# Patient Record
Sex: Female | Born: 1960 | Race: White | Hispanic: No | Marital: Married | Smoking: Current every day smoker
Health system: Southern US, Community
[De-identification: ages and names within clinical notes are randomized; demographics above are authoritative.]

## PROBLEM LIST (undated history)

## (undated) DIAGNOSIS — G4730 Sleep apnea, unspecified: Secondary | ICD-10-CM

## (undated) DIAGNOSIS — J189 Pneumonia, unspecified organism: Secondary | ICD-10-CM

## (undated) DIAGNOSIS — K219 Gastro-esophageal reflux disease without esophagitis: Secondary | ICD-10-CM

## (undated) DIAGNOSIS — J449 Chronic obstructive pulmonary disease, unspecified: Secondary | ICD-10-CM

## (undated) DIAGNOSIS — E039 Hypothyroidism, unspecified: Secondary | ICD-10-CM

## (undated) DIAGNOSIS — G629 Polyneuropathy, unspecified: Secondary | ICD-10-CM

## (undated) DIAGNOSIS — G2581 Restless legs syndrome: Secondary | ICD-10-CM

## (undated) DIAGNOSIS — M1990 Unspecified osteoarthritis, unspecified site: Secondary | ICD-10-CM

## (undated) DIAGNOSIS — M545 Low back pain, unspecified: Secondary | ICD-10-CM

## (undated) DIAGNOSIS — M542 Cervicalgia: Secondary | ICD-10-CM

## (undated) DIAGNOSIS — R0602 Shortness of breath: Secondary | ICD-10-CM

## (undated) DIAGNOSIS — R51 Headache: Secondary | ICD-10-CM

## (undated) DIAGNOSIS — G43909 Migraine, unspecified, not intractable, without status migrainosus: Secondary | ICD-10-CM

## (undated) DIAGNOSIS — E119 Type 2 diabetes mellitus without complications: Secondary | ICD-10-CM

## (undated) DIAGNOSIS — IMO0002 Reserved for concepts with insufficient information to code with codable children: Secondary | ICD-10-CM

## (undated) DIAGNOSIS — G8929 Other chronic pain: Secondary | ICD-10-CM

## (undated) DIAGNOSIS — D649 Anemia, unspecified: Secondary | ICD-10-CM

## (undated) DIAGNOSIS — F419 Anxiety disorder, unspecified: Secondary | ICD-10-CM

## (undated) DIAGNOSIS — M549 Dorsalgia, unspecified: Secondary | ICD-10-CM

## (undated) DIAGNOSIS — G5600 Carpal tunnel syndrome, unspecified upper limb: Secondary | ICD-10-CM

## (undated) DIAGNOSIS — I2510 Atherosclerotic heart disease of native coronary artery without angina pectoris: Secondary | ICD-10-CM

## (undated) DIAGNOSIS — IMO0001 Reserved for inherently not codable concepts without codable children: Secondary | ICD-10-CM

## (undated) HISTORY — DX: Hypothyroidism, unspecified: E03.9

## (undated) HISTORY — PX: CARDIAC CATHETERIZATION: SHX172

## (undated) HISTORY — PX: BACK SURGERY: SHX140

## (undated) HISTORY — DX: Anxiety disorder, unspecified: F41.9

## (undated) HISTORY — DX: Chronic obstructive pulmonary disease, unspecified: J44.9

## (undated) HISTORY — DX: Reserved for inherently not codable concepts without codable children: IMO0001

## (undated) HISTORY — DX: Low back pain: M54.5

## (undated) HISTORY — DX: Migraine, unspecified, not intractable, without status migrainosus: G43.909

## (undated) HISTORY — DX: Type 2 diabetes mellitus without complications: E11.9

## (undated) HISTORY — DX: Carpal tunnel syndrome, unspecified upper limb: G56.00

## (undated) HISTORY — DX: Cervicalgia: M54.2

## (undated) HISTORY — PX: CHOLECYSTECTOMY: SHX55

## (undated) HISTORY — DX: Low back pain, unspecified: M54.50

## (undated) HISTORY — PX: BLADDER SUSPENSION: SHX72

## (undated) HISTORY — PX: TUBAL LIGATION: SHX77

---

## 1997-12-12 ENCOUNTER — Other Ambulatory Visit: Admission: RE | Admit: 1997-12-12 | Discharge: 1997-12-12 | Payer: Self-pay | Admitting: Gynecology

## 2001-01-09 ENCOUNTER — Emergency Department (HOSPITAL_COMMUNITY): Admission: EM | Admit: 2001-01-09 | Discharge: 2001-01-09 | Payer: Self-pay | Admitting: Emergency Medicine

## 2001-01-09 ENCOUNTER — Encounter: Payer: Self-pay | Admitting: Emergency Medicine

## 2001-01-09 ENCOUNTER — Ambulatory Visit (HOSPITAL_COMMUNITY): Admission: RE | Admit: 2001-01-09 | Discharge: 2001-01-09 | Payer: Self-pay | Admitting: Obstetrics and Gynecology

## 2001-01-17 ENCOUNTER — Ambulatory Visit (HOSPITAL_COMMUNITY): Admission: RE | Admit: 2001-01-17 | Discharge: 2001-01-17 | Payer: Self-pay | Admitting: Obstetrics and Gynecology

## 2001-01-23 ENCOUNTER — Encounter: Payer: Self-pay | Admitting: Emergency Medicine

## 2001-01-23 ENCOUNTER — Emergency Department (HOSPITAL_COMMUNITY): Admission: EM | Admit: 2001-01-23 | Discharge: 2001-01-23 | Payer: Self-pay | Admitting: Emergency Medicine

## 2002-01-11 ENCOUNTER — Ambulatory Visit (HOSPITAL_COMMUNITY): Admission: RE | Admit: 2002-01-11 | Discharge: 2002-01-11 | Payer: Self-pay | Admitting: Obstetrics and Gynecology

## 2002-10-19 ENCOUNTER — Encounter: Payer: Self-pay | Admitting: Emergency Medicine

## 2002-10-19 ENCOUNTER — Emergency Department (HOSPITAL_COMMUNITY): Admission: EM | Admit: 2002-10-19 | Discharge: 2002-10-19 | Payer: Self-pay | Admitting: Emergency Medicine

## 2003-01-31 ENCOUNTER — Ambulatory Visit (HOSPITAL_COMMUNITY): Admission: RE | Admit: 2003-01-31 | Discharge: 2003-01-31 | Payer: Self-pay | Admitting: Obstetrics and Gynecology

## 2003-02-01 ENCOUNTER — Emergency Department (HOSPITAL_COMMUNITY): Admission: EM | Admit: 2003-02-01 | Discharge: 2003-02-01 | Payer: Self-pay | Admitting: Emergency Medicine

## 2003-02-19 ENCOUNTER — Emergency Department (HOSPITAL_COMMUNITY): Admission: EM | Admit: 2003-02-19 | Discharge: 2003-02-19 | Payer: Self-pay | Admitting: Emergency Medicine

## 2003-12-02 ENCOUNTER — Emergency Department (HOSPITAL_COMMUNITY): Admission: EM | Admit: 2003-12-02 | Discharge: 2003-12-02 | Payer: Self-pay | Admitting: Emergency Medicine

## 2004-03-13 ENCOUNTER — Ambulatory Visit (HOSPITAL_COMMUNITY): Admission: RE | Admit: 2004-03-13 | Discharge: 2004-03-13 | Payer: Self-pay | Admitting: Obstetrics and Gynecology

## 2004-05-28 ENCOUNTER — Ambulatory Visit (HOSPITAL_COMMUNITY): Admission: RE | Admit: 2004-05-28 | Discharge: 2004-05-28 | Payer: Self-pay | Admitting: Obstetrics and Gynecology

## 2005-04-05 ENCOUNTER — Ambulatory Visit (HOSPITAL_COMMUNITY): Admission: RE | Admit: 2005-04-05 | Discharge: 2005-04-05 | Payer: Self-pay | Admitting: Obstetrics and Gynecology

## 2006-05-08 ENCOUNTER — Emergency Department (HOSPITAL_COMMUNITY): Admission: EM | Admit: 2006-05-08 | Discharge: 2006-05-08 | Payer: Self-pay | Admitting: Emergency Medicine

## 2006-06-09 ENCOUNTER — Ambulatory Visit (HOSPITAL_COMMUNITY): Admission: RE | Admit: 2006-06-09 | Discharge: 2006-06-09 | Payer: Self-pay | Admitting: Obstetrics and Gynecology

## 2006-07-03 ENCOUNTER — Emergency Department (HOSPITAL_COMMUNITY): Admission: EM | Admit: 2006-07-03 | Discharge: 2006-07-03 | Payer: Self-pay | Admitting: Emergency Medicine

## 2006-09-24 ENCOUNTER — Emergency Department (HOSPITAL_COMMUNITY): Admission: EM | Admit: 2006-09-24 | Discharge: 2006-09-24 | Payer: Self-pay | Admitting: Emergency Medicine

## 2006-10-21 ENCOUNTER — Emergency Department (HOSPITAL_COMMUNITY): Admission: EM | Admit: 2006-10-21 | Discharge: 2006-10-21 | Payer: Self-pay | Admitting: Emergency Medicine

## 2006-10-26 ENCOUNTER — Emergency Department (HOSPITAL_COMMUNITY): Admission: EM | Admit: 2006-10-26 | Discharge: 2006-10-26 | Payer: Self-pay | Admitting: Emergency Medicine

## 2006-11-01 ENCOUNTER — Emergency Department (HOSPITAL_COMMUNITY): Admission: EM | Admit: 2006-11-01 | Discharge: 2006-11-01 | Payer: Self-pay | Admitting: Emergency Medicine

## 2007-02-13 ENCOUNTER — Ambulatory Visit (HOSPITAL_COMMUNITY): Admission: RE | Admit: 2007-02-13 | Discharge: 2007-02-13 | Payer: Self-pay | Admitting: Neurology

## 2007-03-02 ENCOUNTER — Encounter (HOSPITAL_COMMUNITY): Admission: RE | Admit: 2007-03-02 | Discharge: 2007-04-01 | Payer: Self-pay | Admitting: Anesthesiology

## 2007-04-16 ENCOUNTER — Other Ambulatory Visit: Payer: Self-pay | Admitting: Emergency Medicine

## 2007-04-16 ENCOUNTER — Other Ambulatory Visit: Payer: Self-pay

## 2007-04-16 ENCOUNTER — Inpatient Hospital Stay (HOSPITAL_COMMUNITY): Admission: EM | Admit: 2007-04-16 | Discharge: 2007-04-17 | Payer: Self-pay | Admitting: *Deleted

## 2007-05-23 ENCOUNTER — Encounter (HOSPITAL_COMMUNITY): Admission: RE | Admit: 2007-05-23 | Discharge: 2007-06-22 | Payer: Self-pay | Admitting: Neurology

## 2007-06-13 ENCOUNTER — Ambulatory Visit (HOSPITAL_COMMUNITY): Admission: RE | Admit: 2007-06-13 | Discharge: 2007-06-13 | Payer: Self-pay | Admitting: Obstetrics and Gynecology

## 2007-06-23 ENCOUNTER — Encounter (HOSPITAL_COMMUNITY): Admission: RE | Admit: 2007-06-23 | Discharge: 2007-07-23 | Payer: Self-pay | Admitting: Neurology

## 2007-08-25 ENCOUNTER — Ambulatory Visit (HOSPITAL_COMMUNITY): Admission: RE | Admit: 2007-08-25 | Discharge: 2007-08-25 | Payer: Self-pay | Admitting: Anesthesiology

## 2007-09-19 ENCOUNTER — Emergency Department (HOSPITAL_COMMUNITY): Admission: EM | Admit: 2007-09-19 | Discharge: 2007-09-19 | Payer: Self-pay | Admitting: Emergency Medicine

## 2007-12-23 ENCOUNTER — Ambulatory Visit (HOSPITAL_COMMUNITY): Admission: RE | Admit: 2007-12-23 | Discharge: 2007-12-23 | Payer: Self-pay | Admitting: Anesthesiology

## 2008-06-01 ENCOUNTER — Emergency Department (HOSPITAL_COMMUNITY): Admission: EM | Admit: 2008-06-01 | Discharge: 2008-06-01 | Payer: Self-pay | Admitting: Emergency Medicine

## 2010-01-09 ENCOUNTER — Emergency Department (HOSPITAL_COMMUNITY)
Admission: EM | Admit: 2010-01-09 | Discharge: 2010-01-09 | Payer: Self-pay | Source: Home / Self Care | Admitting: Emergency Medicine

## 2010-01-14 ENCOUNTER — Other Ambulatory Visit: Payer: Self-pay | Admitting: Obstetrics and Gynecology

## 2010-01-14 ENCOUNTER — Other Ambulatory Visit
Admission: RE | Admit: 2010-01-14 | Discharge: 2010-01-14 | Payer: Self-pay | Source: Home / Self Care | Admitting: Obstetrics and Gynecology

## 2010-01-25 ENCOUNTER — Encounter: Payer: Self-pay | Admitting: Obstetrics and Gynecology

## 2010-01-26 ENCOUNTER — Encounter: Payer: Self-pay | Admitting: Obstetrics and Gynecology

## 2010-04-25 ENCOUNTER — Emergency Department (HOSPITAL_COMMUNITY)
Admission: EM | Admit: 2010-04-25 | Discharge: 2010-04-25 | Disposition: A | Discharge status: Home / Self Care | Payer: Medicaid Other | Attending: Emergency Medicine | Admitting: Emergency Medicine

## 2010-04-25 DIAGNOSIS — H109 Unspecified conjunctivitis: Secondary | ICD-10-CM | POA: Insufficient documentation

## 2010-05-19 NOTE — Discharge Summary (Signed)
Patricia Bass, CHICO NO.:  [id number]   MEDICAL RECORD NO.:  [id number]          PATIENT TYPE:  INP   LOCATION:  2028                         FACILITY:  [facility]   PHYSICIAN:  Ritta Slot, MD     DATE OF BIRTH:  1960/07/05   DATE OF ADMISSION:  04/16/2007  DATE OF DISCHARGE:  04/17/2007                               DISCHARGE SUMMARY   HISTORY OF PRESENT ILLNESS:  Ms. Patricia Bass is a 50 year old female patient  who came into the emergency room because of chest pain radiating across  her chest to the left shoulder and arms.  She had shortness of breath  with this, but no diaphoresis.  It lasted for about 2 hours, it was  relieved with sublingual nitroglycerin in the emergency room.  Thus, it  was decided that she should undergo cardiac catheterization.  She was  put on IV heparin.  She had a cardiac catheterization on April 17, 2007.  She had only 30% in her LAD and 50% in her mid RCA, which was  nondominant and was decided she could go home later that afternoon.  EF  was 65%.  She did have smoking cessation consult.   LABORATORY DATA:  Chest x-ray show pulmonary vascular congestion without  overt pulmonary edema, patchy airspace opacity in the left lower lobe,  they were atelectasis.  Hemoglobin was 12.7, hematocrit 37.3, WBC is  9.4, and platelets 166.  Sodium 141, potassium 3.7, albumin 7,  creatinine 0.65, amylase 128, lipase 24, CK-MB and troponin negative x2.  Total cholesterol 178, triglycerides 412, HDL 26, and LDL was not  calculated.  TSH was 4.299.   DISCHARGE MEDICATIONS:  1. Nabumetone 500 mg b.i.d.  2. Gabapentin 300 mg t.i.d.  3. Fexofenadine 180 mg every day  4. Morphine sulfate 30 mg p.o. a day.  5. Omeprazole 20 mg every day.  6. Cymbalta 30 mg at bedtime.  7. Valtrex 500 mg every day.  8. Aspirin 81 mg a day.   She was given instructions on diet, asked to quit smoking.  She was told  to start exercise program to try to lose weight and follow  up with her  primary doctor for her cholesterols.  We have not put her on a  cholesterol medication at this time.  We will try diet initially.  She  should see Dr. Milinda Cave this week.   DISCHARGE DIAGNOSES:  1. Chest pain, not coronary ischemia, possible musculoskeletal versus      gastroesophageal reflux disease.  2. Chronic pain, history on multiple pain medications.  3. Coronary artery disease, nonobstructive and mild.  4. Borderline diabetes mellitus.  5. Metabolic syndrome.  6. Tobacco use.  7. Premature family history of heart disease.  8. Gastroesophageal reflux disease/peptic ulcer disease.  9. Obstructive sleep apnea.  10.Obesity.  11.Fibromyalgia.  12.Chronic back pain.  13.Restless legs syndrome.  14.History of cholecystectomy.  15.Tubal ligation.      Lezlie Octave, N.P.      Ritta Slot, MD  Electronically Signed    BB/MEDQ  D:  05/12/2007  T:  05/13/2007  Job:  [id number]   cc:   Jeoffrey Massed, MD

## 2010-05-19 NOTE — Cardiovascular Report (Signed)
NAMEBISMA, Patricia NO.:  [id number]   MEDICAL RECORD NO.:  [id number]          PATIENT TYPE:  INP   LOCATION:  2028                         FACILITY:  [facility]   PHYSICIAN:  Ritta Slot, MD     DATE OF BIRTH:  [birth year]   DATE OF PROCEDURE:  DATE OF DISCHARGE:  04/17/2007                            CARDIAC CATHETERIZATION   PROCEDURE PERFORMED:  1. Retrograde left heart cardiac catheterization.  2. Selective coronary artery visualization of the coronary artery.  3. Left ventriculography.   INDICATIONS:  This is a 50 year old Caucasian female who was admitted  yesterday with chest pain, but with negative troponins and negative ECG,  had recent atherosclerotic coronary artery disease, acute smoking,  diabetes, and obesity.  She is therefore brought to the [facility]  Catheterization [facility] for evaluation of the coronary arteries.   PROCEDURES:  1. Approach right femoral artery.  2. Local anesthetic used 15 millimeters of 1% lidocaine.  3. Sedation given is 2 mg of Versed intravenous.  4. Complications none.   INDICATIONS FOR PROCEDURE:  After informed consent, the patient's right  coronary was prepped and draped in the usual sterile fashion.  The local  anesthetic was infiltrated in the right groin for local anesthesia.  The  right femoral artery was accessed using the Seldinger technique and a  Cook needle.  Through the [facility] needle, we swept the J-wire.  The J-wire  was placed in the descending aorta.  The Cook needle was removed and a 6-  [place] sheath was placed over the J-wire.  Over the J-wire, I then  passed the JL4 catheter.  This engaged the left main coronary artery  without difficulty.  Selective visualization of the left coronary artery  system was obtained in AP cranial, AP caudal, and cranial view.  The JL4  catheter was then exchanged over the J-wire for JL4 catheter.  The JL4  catheter was then engaged into the right coronary artery  without  difficulty.  Visualization of the right coronary artery was obtained in  the LA cranial and RA cranial view.  The JL4 catheter was then exchanged  over the J-wire for an angled pigtail.  The angled pigtail was placed  into the LV without difficulty.  Visualization of the left ventricle was  obtained in the RA view using 30 milliliters of contrast with 12  milliliters per second.   FINDINGS:  1. Right coronary artery.  The right coronary artery was found to be      long and dominant, giving rise to the PLA and PDA branch as well as      an RV marginal branch.  Just after the bifurcation of the left      ventricle marginal, there is a discrete 50% stenosis in the mid RCA      that did not need intervention.  The left anterior descending      artery was a long artery.  It went all the way around to the apex.      It gave rise to one diagonal and multiple septals.  There was no  evidence of any significant stenosis in the LAD system, although      there was a mild 30% discrete lesion in the mid LAD after the      second septal perforation.  2. The second circumflex system was a large vessel giving rise to      three obtuse marginals.  There was no evidence of any significant      disease in the circumflex system.  3. The left ventriculography showed normal left ventricular function      with an EF of 65%.  The Ao pressure was 112/70/85.  Left      ventricular pressure was 117/11/19.  Left ventricular pressure was      123/19/20.  Ao pressure was 111/57/87.  There was no evidence of      aortic valve stenosis by pullback gradient.   IMPRESSION:  Mild coronary artery disease with a mid right coronary  artery lesion of 50% and mid left anterior descending lesion of 30%.  Normal circumflexes.  Normal left ventricular function.   PLAN:  The patient to go home later today for medical therapy.      Ritta Slot, MD  Electronically Signed     Ritta Slot, MD   Electronically Signed    HS/MEDQ  D:  04/17/2007  T:  04/18/2007  Job:  [id number]

## 2010-05-22 NOTE — Op Note (Signed)
Patricia Bass, Patricia Bass                ACCOUNT NO.:  [id number]   MEDICAL RECORD NO.:  [id number]          PATIENT TYPE:  AMB   LOCATION:                                FACILITY:  [facility]   PHYSICIAN:  Tilda Burrow, M.D. DATE OF BIRTH:  Mar 09, 1960   DATE OF PROCEDURE:  DATE OF DISCHARGE:                                 OPERATIVE REPORT   PREOPERATIVE DIAGNOSES:  1.  Menorrhagia.  2.  Longterm Depo Provera use.   POSTOPERATIVE DIAGNOSES:  1.  Menorrhagia.  2.  Longterm Depo Provera use.   PROCEDURE:  Hysteroscopy, endometrial ablation.   SURGEON:  Tilda Burrow, M.D.   ASSISTANT:  __________ .   COMPLICATIONS:  None.   FINDINGS:  Smooth endometrial cavity without evidence of polyps or  hyperplasia.   DESCRIPTION OF PROCEDURE:  The patient was taken to the operating room and  prepped and draped for vaginal procedure, with speculum inserted and  hysteroscope used to inspect the uterine cavity.  Dilation was performed to  21-French allowing introduction of the rigid 0-degree hysteroscope which  allowed visualization of the uterine fundus.  There were no polyps.  The  endometrial cavity was very atrophic with thin lining of the endometrial  cavity.  There were some varicosities that appeared to be friable.  There  was no tissue warranting curettage.  We proceeded with endometrial ablation  after properly positioning the HTA endometrial ablation hysteroscope in such  a way that the fluid would flow into the full endometrial cavity.  We then  activated the 10-minute sequence on the HTA machine and monitored its  progress.  There was no loss of fluid or loss of pressure.  Satisfactory  femoral changes were achieved throughout the endometrial cavity.  At the  completion of the 10-minute sequence after cooling of the tissue was  performed, we inspected the endometrial cavity and had satisfactorily  uniform thermal changes throughout the endometrium.  Tubal ostia could be  visualized post procedure to confirm satisfactory  positioning.  At no time was there suspicion of uterine perforation.  Fluid  volumes were consistent with preop status with no unexplained fluid loss.  The procedure was successfully completed and the patient allowed to go to  the recovery room.  Sponge and needle counts were correct.       JVF/MEDQ  D:  07/22/2004  T:  07/23/2004  Job:  [id number]

## 2010-05-22 NOTE — H&P (Signed)
NAMEGLADYCE, Patricia Bass                ACCOUNT NO.:  [id number]   MEDICAL RECORD NO.:  [id number]          PATIENT TYPE:  AMB   LOCATION:  DAY                           FACILITY:  [facility]   PHYSICIAN:  Tilda Burrow, M.D. DATE OF BIRTH:  1960-07-14   DATE OF ADMISSION:  05/28/2004  DATE OF DISCHARGE:  LH                                HISTORY & PHYSICAL   ADMITTING DIAGNOSIS:  History of menorrhagia, currently on long-term Depo-  Provera use.   HISTORY OF PRESENT ILLNESS:  This is a 50 year old female on long-term  progestin use, initially Provera 10 mg daily times several years, followed  by conversion to Depo-Provera.  After counseling over the treatment options  and the patient's desire to get off the Depo-Provera in order to avoid the  risks of osteoporosis, we have discussed treatment options, and the patient  has elected endometrial ablation.  She has had an ultrasound which shows a  smooth uterus measuring 7.4 cm longitudinal by 3.9 cm AP by 5 cm transverse  with a 5 mm endometrial stripe without any myometrial pathology noted.  She  is admitted at this time for endometrial ablation after hysteroscopy and  dilation and curettage.  Significantly, the patient has had her first  menstrual bleed while on Depo-Provera after 4 years of amenorrhea on oral  Provera use.   PAST MEDICAL HISTORY:  Benign.   SURGICAL HISTORY:  1.  Tubal ligation.  2.  Cholecystectomy.  3.  Cryosurgery of the cervix.  4.  Laparotomy to remove a tube and ovary.   SOCIAL HISTORY:  Nonsmoker, nondrinker.  Disabled.   PHYSICAL EXAMINATION:  VITAL SIGNS:  Height 5 feet, 1 inch.  Weight 163.  Blood pressure 152/94.  HEENT:  Pupils equal, round and reactive to light.  Extraocular movements  intact.  NECK:  Supple.  Trachea midline.  Thyroid normal.  CHEST:  Clear to auscultation.  CARDIAC:  Regular rate and rhythm.  ABDOMEN:  Soft, nontender, without masses or tenderness.  PELVIC:  External genitalia  normal.  Vaginal exam - estrogen effect present.  Cervix smooth and bulbous.  Uterus normal size, shape, and contour.  Adnexa  without masses.  Hemoccult negative.   IMPRESSION:  History of heavy menses, currently suppressed with Depo-  Provera.  Endometrial ablation candidate to eliminate future bleeding  concerns.   PLAN:  Hysteroscopy D&C, endometrial ablation, May 28, 2004.      JVF/MEDQ  D:  05/27/2004  T:  05/27/2004  Job:  [id number]

## 2010-05-28 ENCOUNTER — Ambulatory Visit (HOSPITAL_COMMUNITY)
Admission: RE | Admit: 2010-05-28 | Discharge: 2010-05-28 | Disposition: A | Discharge status: Home / Self Care | Payer: Medicaid Other | Source: Ambulatory Visit | Attending: Pediatrics | Admitting: Pediatrics

## 2010-05-28 ENCOUNTER — Other Ambulatory Visit (HOSPITAL_COMMUNITY): Payer: Self-pay | Admitting: Pediatrics

## 2010-05-28 DIAGNOSIS — S6990XA Unspecified injury of unspecified wrist, hand and finger(s), initial encounter: Secondary | ICD-10-CM

## 2010-05-28 DIAGNOSIS — M25549 Pain in joints of unspecified hand: Secondary | ICD-10-CM | POA: Insufficient documentation

## 2010-07-28 ENCOUNTER — Ambulatory Visit (HOSPITAL_BASED_OUTPATIENT_CLINIC_OR_DEPARTMENT_OTHER)
Admission: RE | Admit: 2010-07-28 | Discharge: 2010-07-28 | Disposition: A | Discharge status: Home / Self Care | Payer: Medicaid Other | Source: Ambulatory Visit | Attending: Urology | Admitting: Urology

## 2010-07-28 DIAGNOSIS — Z01810 Encounter for preprocedural cardiovascular examination: Secondary | ICD-10-CM | POA: Insufficient documentation

## 2010-07-28 DIAGNOSIS — Z01812 Encounter for preprocedural laboratory examination: Secondary | ICD-10-CM | POA: Insufficient documentation

## 2010-07-28 DIAGNOSIS — N393 Stress incontinence (female) (male): Secondary | ICD-10-CM | POA: Insufficient documentation

## 2010-07-28 LAB — TYPE AND SCREEN: Antibody Screen: NEGATIVE

## 2010-07-28 LAB — CBC
Hemoglobin: 14.8 g/dL (ref 12.0–15.0)
MCH: 31.3 pg (ref 26.0–34.0)
MCHC: 33.1 g/dL (ref 30.0–36.0)
Platelets: 146 10*3/uL — ABNORMAL LOW (ref 150–400)
RBC: 4.73 MIL/uL (ref 3.87–5.11)
RDW: 13 % (ref 11.5–15.5)
WBC: 10.7 10*3/uL — ABNORMAL HIGH (ref 4.0–10.5)

## 2010-07-28 LAB — GLUCOSE, CAPILLARY: Glucose-Capillary: 101 mg/dL — ABNORMAL HIGH (ref 70–99)

## 2010-07-28 LAB — PROTIME-INR
INR: 1 (ref 0.00–1.49)
Prothrombin Time: 13.4 seconds (ref 11.6–15.2)

## 2010-07-30 NOTE — Op Note (Signed)
  NAMESHATORIA, Patricia Bass                ACCOUNT NO.:  [id number]  MEDICAL RECORD NO.:  [id number]  LOCATION:                                 FACILITY:  PHYSICIAN:  Martina Sinner, MD DATE OF BIRTH:  Jun 08, 1960  DATE OF PROCEDURE:  07/28/2010 DATE OF DISCHARGE:                              OPERATIVE REPORT   PREOPERATIVE DIAGNOSIS:  Stress urinary continence.  POSTOPERATIVE DIAGNOSIS:  Stress urinary continence.  SURGERY:  Sling urethropexy plus cystoscopy [facility]).  Ms. Patricia Bass has the above diagnosis.  She was treated initially with medical and behavioral therapy.  Extra care was taken with leg position to minimize her risk of compartment syndrome, neuropathy, and DVT. Preoperative antibiotics were given.  Preoperative laboratory tests were normal.  I made 1 cm incision, one fingerbreadth above the symphysis pubis and 1.5 cm lateral to the midline.  I made a 2 cm suburethral incision underneath her midurethra.  She had a short urethra.  I instilled 3 or 4 cc of lidocaine epinephrine mixture and made the incision in the appropriate depth.  I sharply dissected the urethrovesical angle bilaterally.  With the bladder empties, I passed a SPARC needle on top and along the back of symphysis pubis, staying lateral using my usual box technique and turning onto the pop my index finger bilaterally.  I then cystoscoped the patient.  There was no injury to bladder or urethra with excellent blue jets bilaterally.  There is no movement of bladder with movement of trocars.  With the bladder empties, I attached a SPARC sling, and brought it up through the pubic space and tensioned over the fat, probably a moderate- sized Kelly clamp.  I cut below the blue dots, irrigated the sheath, removed the sheaths.  I was very happy with the tension and position of the sling.  There was appropriate hypermobility, no spring back effect.  I closed the anterior vaginal wall after irrigating with  running 2-0 Vicryl, followed by wide 2 interrupted sutures.  I irrigated the abdominal incisions.  I cut the sling below the skin and closed the abdominal incisions with one interrupted 4-0 Vicryl, followed by Dermabond.  Vaginal pack was applied.  Leg position was good.  Urine was blue and draining well.  Hopefully, this procedure reach her treatment goal.          ______________________________ Martina Sinner, MD     SAM/MEDQ  D:  07/28/2010  T:  07/28/2010  Job:  [id number]  Electronically Signed by Alfredo Martinez MD on 07/30/2010 11:14:43 AM

## 2010-09-29 LAB — BASIC METABOLIC PANEL
BUN: 8
CO2: 28
Calcium: 9
Calcium: 9.1
Chloride: 103
GFR calc non Af Amer: >60
GFR calc non Af Amer: >60
Glucose, Bld: 102 — ABNORMAL HIGH
Glucose, Bld: 130 — ABNORMAL HIGH
Potassium: 3.5
Potassium: 3.7
Sodium: 137
Sodium: 138
Sodium: 141

## 2010-09-29 LAB — POCT CARDIAC MARKERS
CKMB, poc: 1.4
Myoglobin, poc: 53.5
Operator id: 166561

## 2010-09-29 LAB — DIFFERENTIAL
Basophils Absolute: 0
Basophils Relative: 0
Eosinophils Absolute: 0.1
Eosinophils Relative: 1
Neutro Abs: 6.4
Neutrophils Relative %: 66

## 2010-09-29 LAB — CBC
HCT: 37.3
HCT: 38.1
Hemoglobin: 13.7
Hemoglobin: 12.7
Hemoglobin: 13.5
MCHC: 35.6
Platelets: 169
Platelets: 166
Platelets: 178
RBC: 4.08
RDW: 14.3
RDW: 14.6
WBC: 9.4

## 2010-09-29 LAB — TSH: TSH: 4.299

## 2010-09-29 LAB — TROPONIN I: Troponin I: <0.01

## 2010-09-29 LAB — LIPID PANEL
Cholesterol: 178
Total CHOL/HDL Ratio: 6.8
VLDL: UNDETERMINED

## 2010-09-29 LAB — CK TOTAL AND CKMB (NOT AT ARMC)
CK, MB: 1.8
CK, MB: 2
Total CK: 123
Total CK: 98

## 2010-09-29 LAB — MAGNESIUM: Magnesium: 2.2

## 2010-09-29 LAB — APTT: aPTT: 30

## 2010-09-29 LAB — HEPARIN LEVEL (UNFRACTIONATED): Heparin Unfractionated: <0.1 — ABNORMAL LOW

## 2010-10-27 ENCOUNTER — Other Ambulatory Visit (HOSPITAL_COMMUNITY): Payer: Self-pay | Admitting: Physical Medicine and Rehabilitation

## 2010-10-27 DIAGNOSIS — M4802 Spinal stenosis, cervical region: Secondary | ICD-10-CM

## 2010-10-27 DIAGNOSIS — G894 Chronic pain syndrome: Secondary | ICD-10-CM

## 2010-10-27 DIAGNOSIS — M5412 Radiculopathy, cervical region: Secondary | ICD-10-CM

## 2010-10-27 DIAGNOSIS — M542 Cervicalgia: Secondary | ICD-10-CM

## 2010-10-27 DIAGNOSIS — M5030 Other cervical disc degeneration, unspecified cervical region: Secondary | ICD-10-CM

## 2010-11-03 ENCOUNTER — Ambulatory Visit (HOSPITAL_COMMUNITY)
Admission: RE | Admit: 2010-11-03 | Discharge: 2010-11-03 | Disposition: A | Discharge status: Home / Self Care | Payer: Medicaid Other | Source: Ambulatory Visit | Attending: Physical Medicine and Rehabilitation | Admitting: Physical Medicine and Rehabilitation

## 2010-11-03 DIAGNOSIS — M5030 Other cervical disc degeneration, unspecified cervical region: Secondary | ICD-10-CM

## 2010-11-03 DIAGNOSIS — G894 Chronic pain syndrome: Secondary | ICD-10-CM

## 2010-11-03 DIAGNOSIS — M5020 Other cervical disc displacement, unspecified cervical region: Secondary | ICD-10-CM | POA: Insufficient documentation

## 2010-11-03 DIAGNOSIS — M5412 Radiculopathy, cervical region: Secondary | ICD-10-CM

## 2010-11-03 DIAGNOSIS — M4802 Spinal stenosis, cervical region: Secondary | ICD-10-CM

## 2010-11-03 DIAGNOSIS — M542 Cervicalgia: Secondary | ICD-10-CM | POA: Insufficient documentation

## 2010-11-13 ENCOUNTER — Encounter: Payer: Medicaid Other | Attending: Physical Medicine & Rehabilitation

## 2010-11-13 ENCOUNTER — Ambulatory Visit: Payer: Medicaid Other | Admitting: Physical Medicine & Rehabilitation

## 2010-11-13 DIAGNOSIS — G5620 Lesion of ulnar nerve, unspecified upper limb: Secondary | ICD-10-CM | POA: Insufficient documentation

## 2010-11-13 DIAGNOSIS — R209 Unspecified disturbances of skin sensation: Secondary | ICD-10-CM | POA: Insufficient documentation

## 2010-11-13 DIAGNOSIS — M542 Cervicalgia: Secondary | ICD-10-CM | POA: Insufficient documentation

## 2010-11-13 DIAGNOSIS — M25519 Pain in unspecified shoulder: Secondary | ICD-10-CM | POA: Insufficient documentation

## 2010-12-14 ENCOUNTER — Ambulatory Visit (HOSPITAL_COMMUNITY)
Admission: RE | Admit: 2010-12-14 | Discharge: 2010-12-14 | Disposition: A | Discharge status: Home / Self Care | Payer: Medicaid Other | Source: Ambulatory Visit | Attending: Physical Medicine and Rehabilitation | Admitting: Physical Medicine and Rehabilitation

## 2010-12-14 ENCOUNTER — Other Ambulatory Visit (HOSPITAL_COMMUNITY): Payer: Self-pay | Admitting: Physical Medicine and Rehabilitation

## 2010-12-14 DIAGNOSIS — M899 Disorder of bone, unspecified: Secondary | ICD-10-CM | POA: Insufficient documentation

## 2010-12-14 DIAGNOSIS — G8929 Other chronic pain: Secondary | ICD-10-CM

## 2010-12-14 DIAGNOSIS — M25579 Pain in unspecified ankle and joints of unspecified foot: Secondary | ICD-10-CM | POA: Insufficient documentation

## 2010-12-14 DIAGNOSIS — M25519 Pain in unspecified shoulder: Secondary | ICD-10-CM | POA: Insufficient documentation

## 2011-03-17 ENCOUNTER — Other Ambulatory Visit: Payer: Self-pay | Admitting: Orthopedic Surgery

## 2011-03-19 ENCOUNTER — Encounter (HOSPITAL_COMMUNITY): Payer: Self-pay | Admitting: Pharmacy Technician

## 2011-03-25 ENCOUNTER — Encounter (HOSPITAL_COMMUNITY): Payer: Self-pay

## 2011-03-25 ENCOUNTER — Other Ambulatory Visit: Payer: Self-pay

## 2011-03-25 ENCOUNTER — Encounter (HOSPITAL_COMMUNITY)
Admission: RE | Admit: 2011-03-25 | Discharge: 2011-03-25 | Disposition: A | Discharge status: Home / Self Care | Payer: Medicaid Other | Source: Ambulatory Visit | Attending: Orthopedic Surgery | Admitting: Orthopedic Surgery

## 2011-03-25 ENCOUNTER — Encounter (HOSPITAL_COMMUNITY)
Admission: RE | Admit: 2011-03-25 | Discharge: 2011-03-25 | Disposition: A | Discharge status: Home / Self Care | Payer: Medicaid Other | Source: Ambulatory Visit | Attending: Anesthesiology | Admitting: Anesthesiology

## 2011-03-25 HISTORY — DX: Shortness of breath: R06.02

## 2011-03-25 HISTORY — DX: Restless legs syndrome: G25.81

## 2011-03-25 HISTORY — DX: Pneumonia, unspecified organism: J18.9

## 2011-03-25 HISTORY — DX: Sleep apnea, unspecified: G47.30

## 2011-03-25 HISTORY — DX: Reserved for concepts with insufficient information to code with codable children: IMO0002

## 2011-03-25 HISTORY — DX: Headache: R51

## 2011-03-25 HISTORY — DX: Gastro-esophageal reflux disease without esophagitis: K21.9

## 2011-03-25 LAB — CBC
MCH: 32.8 pg (ref 26.0–34.0)
MCHC: 34 g/dL (ref 30.0–36.0)
Platelets: 168 10*3/uL (ref 150–400)
RBC: 4.76 MIL/uL (ref 3.87–5.11)

## 2011-03-25 LAB — COMPREHENSIVE METABOLIC PANEL
ALT: 34 U/L (ref 0–35)
AST: 42 U/L — ABNORMAL HIGH (ref 0–37)
Calcium: 9.6 mg/dL (ref 8.4–10.5)
Potassium: 4.1 mEq/L (ref 3.5–5.1)
Sodium: 139 mEq/L (ref 135–145)
Total Protein: 7.9 g/dL (ref 6.0–8.3)

## 2011-03-25 LAB — SURGICAL PCR SCREEN
MRSA, PCR: NEGATIVE
Staphylococcus aureus: NEGATIVE

## 2011-03-25 MED ORDER — CHLORHEXIDINE GLUCONATE 4 % EX LIQD: 60.0000 mL | Freq: Once | CUTANEOUS | Status: DC

## 2011-03-25 NOTE — Pre-Procedure Instructions (Addendum)
20 Patricia Bass  03/25/2011   Your procedure is scheduled on:  MARCH 28 [facility])  Report to [facility] at 1:45 PM.  Call this number if you have problems the morning of surgery: [phone]   Remember:   Do not eat food:After Midnight.  May have clear liquids: up to 4 Hours before arrival.  Clear liquids include soda, tea, black coffee, apple or grape juice, broth.  Take these medicines the morning of surgery with A SIP OF WATER: INHALER,PAIN PILL AS NEEDED, PRILOSEC   Do not wear jewelry, make-up or nail polish.  Do not wear lotions, powders, or perfumes. You may wear deodorant.  Do not shave 48 hours prior to surgery.  Do not bring valuables to the hospital.  Contacts, dentures or bridgework may not be worn into surgery.  Leave suitcase in the car. After surgery it may be brought to your room.  For patients admitted to the hospital, checkout time is 11:00 AM the day of discharge.   Patients discharged the day of surgery will not be allowed to drive home.  Name and phone number of your driver: NA  Special Instructions: Incentive Spirometry - Practice and bring it with you on the day of surgery. and CHG Shower Use Special Wash: 1/2 bottle night before surgery and 1/2 bottle morning of surgery.   Please read over the following fact sheets that you were given: Pain Booklet, MRSA Information and Surgical Site Infection Prevention

## 2011-03-31 MED ORDER — CEFAZOLIN SODIUM-DEXTROSE 2-3 GM-% IV SOLR
2.0000 g | INTRAVENOUS | Status: AC
  Administered 2011-04-01: 2 g via INTRAVENOUS
  Filled 2011-03-31: qty 50

## 2011-04-01 ENCOUNTER — Encounter (HOSPITAL_COMMUNITY): Payer: Self-pay | Admitting: Anesthesiology

## 2011-04-01 ENCOUNTER — Ambulatory Visit (HOSPITAL_COMMUNITY): Payer: Medicaid Other | Admitting: Anesthesiology

## 2011-04-01 ENCOUNTER — Encounter (HOSPITAL_COMMUNITY): Payer: Self-pay | Admitting: *Deleted

## 2011-04-01 ENCOUNTER — Observation Stay (HOSPITAL_COMMUNITY)
Admission: RE | Admit: 2011-04-01 | Discharge: 2011-04-02 | Disposition: A | Discharge status: Home / Self Care | Payer: Medicaid Other | Source: Ambulatory Visit | Attending: Orthopedic Surgery | Admitting: Orthopedic Surgery

## 2011-04-01 ENCOUNTER — Encounter (HOSPITAL_COMMUNITY)
Admission: RE | Disposition: A | Discharge status: Home / Self Care | Payer: Self-pay | Source: Ambulatory Visit | Attending: Orthopedic Surgery

## 2011-04-01 DIAGNOSIS — G4730 Sleep apnea, unspecified: Secondary | ICD-10-CM | POA: Insufficient documentation

## 2011-04-01 DIAGNOSIS — G5620 Lesion of ulnar nerve, unspecified upper limb: Principal | ICD-10-CM | POA: Insufficient documentation

## 2011-04-01 DIAGNOSIS — K219 Gastro-esophageal reflux disease without esophagitis: Secondary | ICD-10-CM | POA: Insufficient documentation

## 2011-04-01 DIAGNOSIS — I209 Angina pectoris, unspecified: Secondary | ICD-10-CM | POA: Insufficient documentation

## 2011-04-01 DIAGNOSIS — K08109 Complete loss of teeth, unspecified cause, unspecified class: Secondary | ICD-10-CM | POA: Insufficient documentation

## 2011-04-01 DIAGNOSIS — Z01812 Encounter for preprocedural laboratory examination: Secondary | ICD-10-CM | POA: Insufficient documentation

## 2011-04-01 DIAGNOSIS — R51 Headache: Secondary | ICD-10-CM | POA: Insufficient documentation

## 2011-04-01 DIAGNOSIS — F17200 Nicotine dependence, unspecified, uncomplicated: Secondary | ICD-10-CM | POA: Insufficient documentation

## 2011-04-01 DIAGNOSIS — F411 Generalized anxiety disorder: Secondary | ICD-10-CM | POA: Insufficient documentation

## 2011-04-01 DIAGNOSIS — Z01818 Encounter for other preprocedural examination: Secondary | ICD-10-CM | POA: Insufficient documentation

## 2011-04-01 DIAGNOSIS — Z01810 Encounter for preprocedural cardiovascular examination: Secondary | ICD-10-CM | POA: Insufficient documentation

## 2011-04-01 HISTORY — PX: ULNAR NERVE TRANSPOSITION: SHX2595

## 2011-04-01 SURGERY — ULNAR NERVE DECOMPRESSION/TRANSPOSITION
Anesthesia: General | Site: Wrist | Laterality: Left | Wound class: Clean

## 2011-04-01 MED ORDER — FENTANYL CITRATE 0.05 MG/ML IJ SOLN
INTRAMUSCULAR | Status: DC | PRN
  Administered 2011-04-01: 1 ug via INTRAVENOUS
  Administered 2011-04-01: 150 ug via INTRAVENOUS

## 2011-04-01 MED ORDER — CEFAZOLIN SODIUM 1-5 GM-% IV SOLN
1.0000 g | INTRAVENOUS | Status: AC
  Administered 2011-04-01: 1 g via INTRAVENOUS
  Filled 2011-04-01: qty 50

## 2011-04-01 MED ORDER — ALBUTEROL (5 MG/ML) CONTINUOUS INHALATION SOLN
INHALATION_SOLUTION | RESPIRATORY_TRACT | Status: DC | PRN
  Administered 2011-04-01: 2 mg via RESPIRATORY_TRACT

## 2011-04-01 MED ORDER — BUPIVACAINE HCL (PF) 0.25 % IJ SOLN
INTRAMUSCULAR | Status: DC | PRN
  Administered 2011-04-01: 10 mL

## 2011-04-01 MED ORDER — DOCUSATE SODIUM 100 MG PO CAPS
100.0000 mg | ORAL_CAPSULE | Freq: Two times a day (BID) | ORAL | Status: DC
  Administered 2011-04-01 – 2011-04-02 (×2): 100 mg via ORAL
  Filled 2011-04-01 (×3): qty 1

## 2011-04-01 MED ORDER — PHENYLEPHRINE HCL 10 MG/ML IJ SOLN
INTRAMUSCULAR | Status: DC | PRN
  Administered 2011-04-01: 80 ug via INTRAVENOUS

## 2011-04-01 MED ORDER — CLONAZEPAM 1 MG PO TABS
2.0000 mg | ORAL_TABLET | Freq: Every day | ORAL | Status: DC
  Administered 2011-04-01: 2 mg via ORAL
  Filled 2011-04-01: qty 2

## 2011-04-01 MED ORDER — PROPOFOL 10 MG/ML IV EMUL
INTRAVENOUS | Status: DC | PRN
  Administered 2011-04-01: 140 mg via INTRAVENOUS

## 2011-04-01 MED ORDER — VITAMIN C 500 MG PO TABS
1000.0000 mg | ORAL_TABLET | Freq: Every day | ORAL | Status: DC
  Administered 2011-04-01 – 2011-04-02 (×2): 1000 mg via ORAL
  Filled 2011-04-01 (×3): qty 2

## 2011-04-01 MED ORDER — DOXEPIN HCL 25 MG PO CAPS
25.0000 mg | ORAL_CAPSULE | Freq: Every day | ORAL | Status: DC
  Administered 2011-04-01: 25 mg via ORAL
  Filled 2011-04-01 (×3): qty 2

## 2011-04-01 MED ORDER — LACTATED RINGERS IV SOLN
INTRAVENOUS | Status: DC | PRN
  Administered 2011-04-01: 16:00:00 via INTRAVENOUS

## 2011-04-01 MED ORDER — PANTOPRAZOLE SODIUM 40 MG PO TBEC
40.0000 mg | DELAYED_RELEASE_TABLET | Freq: Every day | ORAL | Status: DC
  Administered 2011-04-01 – 2011-04-02 (×2): 40 mg via ORAL
  Filled 2011-04-01 (×2): qty 1

## 2011-04-01 MED ORDER — HYDROMORPHONE HCL PF 1 MG/ML IJ SOLN
0.2500 mg | INTRAMUSCULAR | Status: DC | PRN
  Administered 2011-04-01 (×2): 0.5 mg via INTRAVENOUS

## 2011-04-01 MED ORDER — HYDROMORPHONE HCL PF 1 MG/ML IJ SOLN
INTRAMUSCULAR | Status: AC
  Filled 2011-04-01: qty 1

## 2011-04-01 MED ORDER — ONDANSETRON HCL 4 MG/2ML IJ SOLN: 4.0000 mg | Freq: Once | INTRAMUSCULAR | Status: DC | PRN

## 2011-04-01 MED ORDER — METHOCARBAMOL 100 MG/ML IJ SOLN: 500.0000 mg | Freq: Four times a day (QID) | INTRAVENOUS | Status: DC | PRN

## 2011-04-01 MED ORDER — ONDANSETRON HCL 4 MG PO TABS: 4.0000 mg | ORAL_TABLET | Freq: Four times a day (QID) | ORAL | Status: DC | PRN

## 2011-04-01 MED ORDER — OXYCODONE HCL 5 MG PO TABS
5.0000 mg | ORAL_TABLET | ORAL | Status: DC | PRN
  Administered 2011-04-01: 5 mg via ORAL
  Administered 2011-04-02 (×3): 10 mg via ORAL
  Filled 2011-04-01 (×3): qty 2
  Filled 2011-04-01: qty 1

## 2011-04-01 MED ORDER — ONDANSETRON HCL 4 MG/2ML IJ SOLN: 4.0000 mg | Freq: Four times a day (QID) | INTRAMUSCULAR | Status: DC | PRN

## 2011-04-01 MED ORDER — MIDAZOLAM HCL 5 MG/5ML IJ SOLN
INTRAMUSCULAR | Status: DC | PRN
  Administered 2011-04-01: 1 mg via INTRAVENOUS

## 2011-04-01 MED ORDER — CEFAZOLIN SODIUM 1-5 GM-% IV SOLN
1.0000 g | Freq: Three times a day (TID) | INTRAVENOUS | Status: DC
  Administered 2011-04-02 (×2): 1 g via INTRAVENOUS
  Filled 2011-04-01 (×4): qty 50

## 2011-04-01 MED ORDER — ALBUTEROL SULFATE HFA 108 (90 BASE) MCG/ACT IN AERS
2.0000 | INHALATION_SPRAY | Freq: Four times a day (QID) | RESPIRATORY_TRACT | Status: DC | PRN
  Filled 2011-04-01: qty 6.7

## 2011-04-01 MED ORDER — LIDOCAINE HCL (CARDIAC) 20 MG/ML IV SOLN
INTRAVENOUS | Status: DC | PRN
  Administered 2011-04-01: 20 mg via INTRAVENOUS

## 2011-04-01 MED ORDER — METHOCARBAMOL 500 MG PO TABS
500.0000 mg | ORAL_TABLET | Freq: Four times a day (QID) | ORAL | Status: DC | PRN
  Administered 2011-04-01 – 2011-04-02 (×3): 500 mg via ORAL
  Filled 2011-04-01 (×3): qty 1

## 2011-04-01 MED ORDER — PROMETHAZINE HCL 25 MG RE SUPP: 12.5000 mg | Freq: Four times a day (QID) | RECTAL | Status: DC | PRN

## 2011-04-01 SURGICAL SUPPLY — 44 items
BANDAGE ELASTIC 3 VELCRO ST LF (GAUZE/BANDAGES/DRESSINGS) ×1 IMPLANT
BANDAGE ELASTIC 4 VELCRO ST LF (GAUZE/BANDAGES/DRESSINGS) ×1 IMPLANT
BANDAGE GAUZE ELAST BULKY 4 IN (GAUZE/BANDAGES/DRESSINGS) ×1 IMPLANT
CLOTH BEACON ORANGE TIMEOUT ST (SAFETY) ×2 IMPLANT
CORDS BIPOLAR (ELECTRODE) ×2 IMPLANT
COVER SURGICAL LIGHT HANDLE (MISCELLANEOUS) ×2 IMPLANT
CUFF TOURNIQUET SINGLE 18IN (TOURNIQUET CUFF) IMPLANT
CUFF TOURNIQUET SINGLE 24IN (TOURNIQUET CUFF) IMPLANT
DECANTER SPIKE VIAL GLASS SM (MISCELLANEOUS) ×2 IMPLANT
DRAPE OEC MINIVIEW 54X84 (DRAPES) IMPLANT
DRAPE SURG 17X23 STRL (DRAPES) ×2 IMPLANT
DRSG EMULSION OIL 3X3 NADH (GAUZE/BANDAGES/DRESSINGS) ×1 IMPLANT
GAUZE XEROFORM 1X8 LF (GAUZE/BANDAGES/DRESSINGS) ×1 IMPLANT
GLOVE BIOGEL M STRL SZ7.5 (GLOVE) ×2 IMPLANT
GLOVE SS BIOGEL STRL SZ 8 (GLOVE) ×1 IMPLANT
GLOVE SUPERSENSE BIOGEL SZ 8 (GLOVE) ×1
GOWN PREVENTION PLUS XLARGE (GOWN DISPOSABLE) ×2 IMPLANT
GOWN STRL NON-REIN LRG LVL3 (GOWN DISPOSABLE) ×4 IMPLANT
GOWN STRL REIN XL XLG (GOWN DISPOSABLE) ×4 IMPLANT
KIT BASIN OR (CUSTOM PROCEDURE TRAY) ×2 IMPLANT
KIT ROOM TURNOVER OR (KITS) ×2 IMPLANT
MANIFOLD NEPTUNE II (INSTRUMENTS) ×2 IMPLANT
NDL HYPO 25GX1X1/2 BEV (NEEDLE) IMPLANT
NEEDLE HYPO 25GX1X1/2 BEV (NEEDLE) IMPLANT
NS IRRIG 1000ML POUR BTL (IV SOLUTION) ×2 IMPLANT
PACK ORTHO EXTREMITY (CUSTOM PROCEDURE TRAY) ×2 IMPLANT
PAD ARMBOARD 7.5X6 YLW CONV (MISCELLANEOUS) ×4 IMPLANT
PAD CAST 4YDX4 CTTN HI CHSV (CAST SUPPLIES) IMPLANT
PADDING CAST COTTON 4X4 STRL (CAST SUPPLIES) ×4
SOLUTION BETADINE 4OZ (MISCELLANEOUS) ×2 IMPLANT
SPECIMEN JAR SMALL (MISCELLANEOUS) ×2 IMPLANT
SPONGE GAUZE 4X4 12PLY (GAUZE/BANDAGES/DRESSINGS) ×1 IMPLANT
SPONGE SCRUB IODOPHOR (GAUZE/BANDAGES/DRESSINGS) ×2 IMPLANT
SUCTION FRAZIER TIP 10 FR DISP (SUCTIONS) IMPLANT
SUT MERSILENE 4 0 P 3 (SUTURE) IMPLANT
SUT PROLENE 4 0 PS 2 18 (SUTURE) IMPLANT
SUT VIC AB 2-0 CT1 27 (SUTURE)
SUT VIC AB 2-0 CT1 TAPERPNT 27 (SUTURE) IMPLANT
SYR CONTROL 10ML LL (SYRINGE) IMPLANT
TOWEL OR 17X24 6PK STRL BLUE (TOWEL DISPOSABLE) ×2 IMPLANT
TOWEL OR 17X26 10 PK STRL BLUE (TOWEL DISPOSABLE) ×2 IMPLANT
TUBE CONNECTING 12X1/4 (SUCTIONS) IMPLANT
UNDERPAD 30X30 INCONTINENT (UNDERPADS AND DIAPERS) ×2 IMPLANT
WATER STERILE IRR 1000ML POUR (IV SOLUTION) ×2 IMPLANT

## 2011-04-01 NOTE — Brief Op Note (Signed)
04/01/2011  5:35 PM  PATIENT:  Patricia Bass  51 y.o. female  PRE-OPERATIVE DIAGNOSIS:  LEFT CUBITAL TUNNEL SYNDROME  POST-OPERATIVE DIAGNOSIS:  LEFT CUBITAL TUNNEL SYNDROME  PROCEDURE:  Procedure(s) (LRB): ULNAR NERVE DECOMPRESSION (Left)  SURGEON:  Surgeon(s) and Role:    * Dominica Severin, MD - Primary  PHYSICIAN ASSISTANT:   ASSISTANTS: none   ANESTHESIA:   general  EBL:  Total I/O In: 500 [I.V.:500] Out: -   BLOOD ADMINISTERED:none  DRAINS: none   LOCAL MEDICATIONS USED:  MARCAINE     SPECIMEN:  No Specimen  DISPOSITION OF SPECIMEN:  N/A  COUNTS:  YES  TOURNIQUET:   Total Tourniquet Time Documented: Upper Arm (Left) - 11 minutes  DICTATION: .Other Dictation: Dictation Number [id number]  PLAN OF CARE: Admit for overnight observation  PATIENT DISPOSITION:  PACU - hemodynamically stable.   Delay start of Pharmacological VTE agent (>24hrs) due to surgical blood loss or risk of bleeding: not applicable

## 2011-04-01 NOTE — Anesthesia Postprocedure Evaluation (Signed)
  Anesthesia Post-op Note  Patient: Patricia Bass  Procedure(s) Performed: Procedure(s) (LRB): ULNAR NERVE DECOMPRESSION/TRANSPOSITION (Left)  Patient Location: PACU  Anesthesia Type: General  Level of Consciousness: awake, alert  and oriented  Airway and Oxygen Therapy: Patient Spontanous Breathing and Patient connected to nasal cannula oxygen  Post-op Pain: mild  Post-op Assessment: Post-op Vital signs reviewed, Patient's Cardiovascular Status Stable, Respiratory Function Stable, Patent Airway and Pain level controlled  Post-op Vital Signs: Reviewed and stable  Complications: No apparent anesthesia complications

## 2011-04-01 NOTE — Transfer of Care (Signed)
Immediate Anesthesia Transfer of Care Note  Patient: Patricia Bass  Procedure(s) Performed: Procedure(s) (LRB): ULNAR NERVE DECOMPRESSION/TRANSPOSITION (Left)  Patient Location: PACU  Anesthesia Type: General  Level of Consciousness: awake and alert   Airway & Oxygen Therapy: Patient Spontanous Breathing and Patient connected to face mask oxygen  Post-op Assessment: Report given to PACU RN, Post -op Vital signs reviewed and stable and Patient moving all extremities  Post vital signs: Reviewed and stable  Complications: No apparent anesthesia complications

## 2011-04-01 NOTE — Preoperative (Signed)
Beta Blockers   Reason not to administer Beta Blockers:Not Applicable 

## 2011-04-01 NOTE — H&P (Signed)
Patricia Bass is an 51 y.o. female.   Chief Complaint:Numbness left hand/ ulnar nerve compression HPI: Patricia KitchenMarland KitchenPatient presents for evaluation and treatment of the of their upper extremity predicament. The patient denies neck back chest or of abdominal pain. The patient notes that they have no lower extremity problems. The patient from primarily complains of the upper extremity pain noted. Patient presents for ulnar nerve release.  Past Medical History  Diagnosis Date  . Shortness of breath   . Sleep apnea   . GERD (gastroesophageal reflux disease)   . Restless leg syndrome   . Pneumonia   . Headache   . Bulging disc     Past Surgical History  Procedure Date  . Cholecystectomy   . Oophorectomy   . Tubal ligation   . Bladder suspension   . Cardiac catheterization     History reviewed. No pertinent family history. Social History:  reports that she has been smoking Cigarettes.  She has a 20 pack-year smoking history. She does not have any smokeless tobacco history on file. She reports that she uses illicit drugs (Hydrocodone). She reports that she does not drink alcohol.  Allergies:  Allergies  Allergen Reactions  . Duragesic Disc Transdermal System (Alcohol-Fentanyl) Nausea And Vomiting    Medications Prior to Admission  Medication Dose Route Frequency Provider Last Rate Last Dose  . ceFAZolin (ANCEF) IVPB 2 g/50 mL premix  2 g Intravenous 60 min Pre-Op Sheran Lawless, PA       No current outpatient prescriptions on file as of 04/01/2011.    No results found for this or any previous visit (from the past 48 hour(s)). No results found.  Review of Systems  Constitutional: Negative.   Eyes: Negative.   Cardiovascular: Negative.   Gastrointestinal: Negative.   Genitourinary: Negative.   Neurological: Negative.     Blood pressure 111/76, pulse 85, temperature 98.1 F (36.7 C), temperature source Oral, resp. rate 18, SpO2 98.00%. Physical Exam  ..The patient is alert and  oriented in no acute distress the patient complains of pain in the affected upper extremity. The patient is noted to have a normal HEENT exam. Lung fields show equal chest expansion and no shortness of breath abdomen exam is nontender without distention. Lower extremity examination does not show any fracture dislocation or blood clot symptoms. Pelvis is stable neck and back are stable and nontender Assessment/Plan .Patricia KitchenWe are planning surgery for your upper extremity. The risk and benefits of surgery include risk of bleeding infection anesthesia damage to normal structures and failure of the surgery to accomplish its intended goals of relieving symptoms and restoring function with this in mind we'll going to proceed. I have specifically discussed with the patient the pre-and postoperative regime and the does and don'ts and risk and benefits in great detail. Risk and benefits of surgery also include risk of dystrophy chronic nerve pain failure of the healing process to go onto completion and other inherent risks of surgery The relavent the pathophysiology of the disease/injury process, as well as the alternatives for treatment and postoperative course of action has been discussed in great detail with the patient who desires to proceed.  We will do everything in our power to help you (the patient) restore function to the upper extremity. Is a pleasure to see this patient today.   Karen Chafe 04/01/2011, 4:21 PM

## 2011-04-01 NOTE — Anesthesia Preprocedure Evaluation (Addendum)
Anesthesia Evaluation  Patient identified by MRN, date of birth, ID band Patient awake    Reviewed: Allergy & Precautions, H&P , NPO status , Patient's Chart, lab work & pertinent test results  Airway Mallampati: III TM Distance: <3 FB   Mouth opening: Limited Mouth Opening  Dental  (+) Edentulous Upper and Edentulous Lower   Pulmonary sleep apnea , Current Smoker,    + decreased breath sounds      Cardiovascular + angina Rhythm:Regular Rate:Normal     Neuro/Psych  Headaches, Anxiety    GI/Hepatic GERD-  Medicated and Controlled,  Endo/Other    Renal/GU      Musculoskeletal   Abdominal   Peds  Hematology   Anesthesia Other Findings   Reproductive/Obstetrics                          Anesthesia Physical Anesthesia Plan  ASA: III  Anesthesia Plan: General   Post-op Pain Management:    Induction: Intravenous  Airway Management Planned: LMA  Additional Equipment:   Intra-op Plan:   Post-operative Plan: Extubation in OR  Informed Consent: I have reviewed the patients History and Physical, chart, labs and discussed the procedure including the risks, benefits and alternatives for the proposed anesthesia with the patient or authorized representative who has indicated his/her understanding and acceptance.   Dental advisory given  Plan Discussed with: CRNA, Anesthesiologist and Surgeon  Anesthesia Plan Comments: (Smoker/COPD Chronic Pain Obesity GERD   Plan GA with LMA  Kipp Brood, MD)       Anesthesia Quick Evaluation

## 2011-04-02 MED ORDER — ACETAMINOPHEN 500 MG PO TABS
500.0000 mg | ORAL_TABLET | Freq: Four times a day (QID) | ORAL | Status: DC | PRN
  Administered 2011-04-02: 1000 mg via ORAL
  Filled 2011-04-02: qty 2

## 2011-04-02 MED ORDER — OXYCODONE HCL 5 MG PO TABS: 5.0000 mg | ORAL_TABLET | ORAL | Status: AC | PRN

## 2011-04-02 NOTE — Op Note (Signed)
Patricia Bass, [name]:  [id number]  MEDICAL RECORD NO.:  [id number]  LOCATION:  5029                         FACILITY:  [facility]  PHYSICIAN:  Dionne Ano. 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  PROCEDURE:  Ulnar nerve release, left elbow (decompression of the ulnar nerve at the elbow).  SURGEON:  Dionne Ano. Amanda Pea, MD  ASSISTANT:  None.  COMPLICATIONS:  None.  ANESTHESIA:  General.  TOURNIQUET TIME:  11 minute.  INDICATIONS FOR THE PROCEDURE:  Pleasant female who presents with the above-mentioned diagnosis.  I have counseled her in regard to risk and benefit of surgery including risk of infection, bleeding, anesthesia, damage to normal structures, and failure of surgery to accomplish its intended goals of relieving symptoms and restoring function.  She has been preoperatively cleared by anesthesia and her medical physicians. We have spent a lot of time making sure she is fit as possible for surgery.  I have discussed with she and her family the risks and benefits that she desires to proceed.  OPERATION IN DETAIL:  The patient was seen by myself and Anesthesia, taken to the operative suite, and underwent smooth induction of general anesthesia.  She was given a breathing treatment in the holding area, reviewed by Dr. Noreene Larsson and had noted findings of stable sats after breathing treatment.  Once this was accomplished, she was taken to the operative suite, underwent a general LMA anesthetic, placed spine and appropriately padded.  Prepped and draped in usual sterile fashion, Betadine scrub and paint about  the left upper extremity.  Once this was done, sterile field was secured, arm was elevated at 250 mmHg. Dissection was carried down with curvilinear incision, posterior medial elbow.  The patient tolerated this well.  There were no complicating features.  Once this was done, I released the arcade of Struthers followed by medial intermuscular septum, two heads of the FCU, Osborne ligament, and cubital tunnel.  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  The ulnar nerve was markedly compressed, sat nicely and the screw has had no subluxation tendencies.  Full decompression was accomplished.  Tourniquet was deflated at less than 15 minutes and following this, I secured hemostasis with bipolar electrocautery and following securing the hemostasis, it was irrigated copiously and closed the wound with Prolene.  An 18 mL of Sensorcaine with epinephrine was placed for postop analgesia.  She was stable, looked excellent, and placed a soft compressive bandage on her with excellent refill of the hand noted.  She was taken to the recovery room in stable condition.  All sponges, needle and instrument counts were reported as correct.  Given her pulmonary issues, I am going to admit her overnight for O2 monitoring and likely discharge her tomorrow if things are looking well.  It has been a pleasure to see her.  All questions had been  encouraged and answered.     Dionne Ano. Amanda Pea, M.D.     [facility]  D:  04/01/2011  T:  04/02/2011  Job:  [id number]

## 2011-04-02 NOTE — Discharge Summary (Signed)
Physician Discharge Summary  Patient ID: Patricia Bass MRN: [id number] DOB/AGE: [birth year] 51 y.o.  Admit date: 04/01/2011 Discharge date: 04/02/2011  Admission Diagnoses: Left ulnar nerve compression  Discharge Diagnoses: Same Active Problems:  * No active hospital problems. *    Discharged Condition: good  Hospital Course: Patient was admitted overnight status post ulnar nerve decompression left upper extremity. She did quite well throughout her hospital course. She is a heavy smoker and as we kept her on oxygen. Postoperatively there were no complications or issues that were apparent. At the time of discharge 04/02/2011 the patient was stable awake alert and oriented and ready for DC. Consults: None  Significant Diagnostic Studies:  Treatments: surgery: Patient had surgery March 28 and did quite well. This was an ulnar nerve release.  Discharge Exam: Blood pressure 112/69, pulse 95, temperature 99.1 F (37.3 C), temperature source Oral, resp. rate 16, SpO2 95.00%. General appearance: alert and cooperative..The patient is alert and oriented in no acute distress the patient complains of pain in the affected upper extremity. The patient is noted to have a normal HEENT exam. Lung fields show equal chest expansion and no shortness of breath abdomen exam is nontender without distention. Lower extremity examination does not show any fracture dislocation or blood clot symptoms. Pelvis is stable neck and back are stable and nontender  Disposition: 01-Home or Self Care   Medication List  As of 04/02/2011  2:34 PM   ASK your doctor about these medications         albuterol 108 (90 BASE) MCG/ACT inhaler   Commonly known as: PROVENTIL HFA;VENTOLIN HFA   Inhale 2 puffs into the lungs every 6 (six) hours as needed. Shortness of breath      clonazePAM 1 MG tablet   Commonly known as: KLONOPIN   Take 2 mg by mouth at bedtime.      doxepin 25 MG capsule   Commonly known as: SINEQUAN   Take  25-50 mg by mouth at bedtime. Takes 25-50 mg depending on leg pain      HYDROcodone-acetaminophen 7.5-500 MG per tablet   Commonly known as: LORTAB   Take 1 tablet by mouth every 4 (four) hours as needed. For pain      omeprazole 20 MG capsule   Commonly known as: PRILOSEC   Take 20 mg by mouth daily as needed. Acid reflux      pregabalin 300 MG capsule   Commonly known as: LYRICA   Take 300 mg by mouth 2 (two) times daily.      pyridoxine 100 MG tablet   Commonly known as: B-6   Take 200 mg by mouth daily.             SignedKaren Chafe 04/02/2011, 2:34 PM

## 2011-04-02 NOTE — Discharge Instructions (Signed)
We recommend that you to take vitamin C 1000 mg a day to promote healing we also recommend that if you require her pain medicine that he take a stool softener to prevent constipation as most pain medicines will have constipation side effects. We recommend either Peri-Colace or Senokot and recommend that you also consider adding MiraLAX to prevent the constipation affects from pain medicine if you are required to use them. These medicines are over the counter and maybe purchased at a local pharmacy. ° ° ° °Keep bandage clean and dry.  Call for any problems.  No smoking.  Criteria for driving a car: you should be off your pain medicine for 7-8 hours, able to drive one handed(confident), thinking clearly and feeling able in your judgement to drive. °Continue elevation as it will decrease swelling.  If instructed by MD move your fingers within the confines of the bandage/splint.  Use ice if instructed by your MD. Call immediately for any sudden loss of feeling in your hand/arm or change in functional abilities of the extremity. ° °

## 2011-04-06 ENCOUNTER — Encounter (HOSPITAL_COMMUNITY): Payer: Self-pay | Admitting: Orthopedic Surgery

## 2011-04-14 ENCOUNTER — Ambulatory Visit (HOSPITAL_COMMUNITY)
Admission: RE | Admit: 2011-04-14 | Discharge: 2011-04-14 | Disposition: A | Discharge status: Home / Self Care | Payer: Medicaid Other | Source: Ambulatory Visit | Attending: Orthopedic Surgery | Admitting: Orthopedic Surgery

## 2011-04-14 DIAGNOSIS — Z9889 Other specified postprocedural states: Secondary | ICD-10-CM | POA: Insufficient documentation

## 2011-04-14 DIAGNOSIS — R209 Unspecified disturbances of skin sensation: Secondary | ICD-10-CM | POA: Insufficient documentation

## 2011-04-14 DIAGNOSIS — M25529 Pain in unspecified elbow: Secondary | ICD-10-CM | POA: Insufficient documentation

## 2011-04-14 DIAGNOSIS — IMO0001 Reserved for inherently not codable concepts without codable children: Secondary | ICD-10-CM | POA: Insufficient documentation

## 2011-04-14 DIAGNOSIS — M25539 Pain in unspecified wrist: Secondary | ICD-10-CM | POA: Insufficient documentation

## 2011-04-14 DIAGNOSIS — G5620 Lesion of ulnar nerve, unspecified upper limb: Secondary | ICD-10-CM | POA: Insufficient documentation

## 2011-04-14 NOTE — Evaluation (Addendum)
Occupational Therapy Evaluation - Medicaid Scanned  Patient Details  Name: Patricia Bass MRN: [id number] Date of Birth: [birth year]  Today's Date: 04/14/2011 Time: 1350-1430 Time Calculation (min): 40 min  Visit#: 1  of 8   Re-eval: 05/12/11  Assessment Diagnosis: L Ulnar Nerve Reposition and Cubital Tunnel Release In situ Surgical Date: 04/01/11 Next MD Visit: 04/29/11 Prior Therapy: not for this dx  Past Medical History:  Past Medical History  Diagnosis Date  . Shortness of breath   . Sleep apnea   . GERD (gastroesophageal reflux disease)   . Restless leg syndrome   . Pneumonia   . Headache   . Bulging disc    Past Surgical History:  Past Surgical History  Procedure Date  . Cholecystectomy   . Oophorectomy   . Tubal ligation   . Bladder suspension   . Cardiac catheterization   . Ulnar nerve transposition 04/01/2011    Procedure: ULNAR NERVE DECOMPRESSION/TRANSPOSITION;  Surgeon: Dominica Severin, MD;  Location: [facility] OR;  Service: Orthopedics;  Laterality: Left;  ULNAR NERVE RELEASE WITH ANTERIOR TRANS POSITION REPAIR RECONSTRUCTION AS NECESSARY    Subjective Symptoms/Limitations Symptoms: S:  I want to be able to play my guitar again. Limitations: History:  Ms. Patricia Bass states that she has been experiencing pain and discomfort in her left elbow region for more than a year.  A nerve conduction test detected ulnar nerve occlusion at her medial epicondyle.   Pain Assessment Currently in Pain?: Yes Pain Score:   4 Pain Location: Elbow Pain Orientation: Left Pain Type: Acute pain  Precautions/Restrictions  Precautions Precautions:  (For Ulnar Nerve Reposition:  AROM at elbow with pron w flex ) Precaution Comments: Please see scanned protocol - AROM at elbow x 1 week, at 3 week post op add AROM forearm, wrist, at 5 weeks PROM. Required Braces or Orthoses:  (fabricated long arm splint per protocol)  Prior Functioning     Assessment ADL/Vision/Perception     Cognition/Observation Cognition Orientation Level: Oriented X4 Observation/Other Assessments Observations:  (11 cm healing incision along posterior left elbow with steri)  Sensation/Coordination/Edema Sensation Light Touch:  (Impaired along ulnar nerve path)  Additional Assessments LUE AROM (degrees) LUE Overall AROM Comments: assessed in seated Left Elbow Flexion/Extension 0-135-150: 135  (extension +25) Left Forearm Pronation  0-80-90: 85 Degrees Left Forearm Supination  0-80-90: 70 Degrees Left Wrist Extension 0-70: 30 Degrees Left Wrist Flexion 0-80: 35 Degrees Palpation Palpation: tenderness along incision, moderate fascial restrictiosn  Exercise/Treatments     Splinting Splinting: Per protocol, fabricated a long arm splint for left arm with elbow flexed to 90, forearm in neutral, and wrist slightly flexed.  I educated patient on contraindications, wear and care.  She was able to demonsrate I with this information.  Occupational Therapy Assessment and Plan OT Assessment and Plan Clinical Impression Statement: A: Patient presents with decreased ROM and strength and increased pain and restrictions in her left elbow and forearm s/p surgery.  These deficits are effecting her ability to complete her ADLs. Rehab Potential: Excellent OT Frequency: Min 1X/week OT Duration: 8 weeks OT Treatment/Interventions: Self-care/ADL training;Therapeutic exercise;Splinting;Manual therapy;Patient/family education OT Plan: P:  Skilled OT intervention to decrease pain and restrictions and increase AROM and strength:  Treatment Plan: AROM at forearm, wrist, and hand.  at 5 weeks begin PROM.       Goals Short Term Goals Time to Complete Short Term Goals: 4 weeks Short Term Goal 1: Patient will be educated on a HEP. Short Term Goal 2:  Patient will increase PROM to [facility] in her LUE for increased ability to complete ADLS> Short Term Goal 3: Patient will decrease fascial restrictions to moderate  in her left arm. Short Term Goal 4: Patient will decrease pain to 2/10 in her left elbow region. Short Term Goal 5: Assess left arm and hand strength. Long Term Goals Time to Complete Long Term Goals: 8 weeks Long Term Goal 1: Patient will be able to play her guitar and complete all daily activities without difficulty. Long Term Goal 2: Patient will increase LUE AROM to WNL for increased ability to complete all daily activities. Long Term Goal 3: Patient will improve light touch sensation in her left ring and small digits from impaired to intact. Long Term Goal 4: Patient will increase left arm strength to 5/5 for increased ability to lift household items. Long Term Goal 5: Patient will have functional grip and pinch strength in her left hand. Additional Long Term Goals?: Yes Long Term Goal 6: Patient will have 1/10 pain in her left elbow when playing the guitar. Long Term Goal 7: Patient will have trace fascial restrictions in her left upper extremity.  Problem List Patient Active Problem List  Diagnoses  . S/P cubital tunnel release  . Ulnar nerve compression  . Pain in joint, forearm    End of Session Activity Tolerance: Patient tolerated treatment well General Behavior During Session: [facility] for tasks performed Cognition: [facility] for tasks performed OT Plan of Care OT Home Exercise Plan: Educated on HEP for splint use and for elbow flexion with forearm pronated and wrist flexed.      Shirlean Mylar, OTR/L  04/14/2011, 4:13 PM  Physician Documentation Your signature is required to indicate approval of the treatment plan as stated above.  Please sign and either send electronically or make a copy of this report for your files and return this physician signed original.  Please mark one 1.__approve of plan  2. ___approve of plan with the following conditions.   ______________________________                                                          _____________________ Physician  Signature                                                                                                             Date

## 2011-04-16 ENCOUNTER — Other Ambulatory Visit: Payer: Self-pay | Admitting: Family Medicine

## 2011-04-21 ENCOUNTER — Ambulatory Visit (HOSPITAL_COMMUNITY)
Admission: RE | Admit: 2011-04-21 | Discharge: 2011-04-21 | Disposition: A | Discharge status: Home / Self Care | Payer: Medicaid Other | Source: Ambulatory Visit | Attending: Pediatrics | Admitting: Pediatrics

## 2011-04-21 NOTE — Progress Notes (Signed)
Occupational Therapy Treatment  Patient Details  Name: Patricia Bass MRN: [id number] Date of Birth: April 28, 1960  Today's Date: 04/21/2011 Time: 3086-5784 Time Calculation (min): 44 min MFR 103-125 22' Therapeutic Exercise 126-147 21'  Visit#: 2  of 8   Re-eval: 05/12/11 Assessment Diagnosis: L Ulnar Nerve Reposition and Cubital Tunnel Release In situ Surgical Date: 04/01/11 Next MD Visit: 04/29/11  Subjective Symptoms/Limitations Symptoms: S:  My arm is doing pretty good, ,my little finger is still numb. Pain Assessment Currently in Pain?: No/denies  Precautions/Restrictions     Exercise/Treatments Supine Protraction: AROM;10 reps Horizontal ABduction: AROM;10 reps External Rotation: AROM;10 reps Internal Rotation: AROM;10 reps Flexion: AROM;10 reps ABduction: AROM;10 reps Therapy Ball Flexion: 15 reps ABduction: 15 reps      Elbow Exercises Elbow Flexion: AROM;10 reps Elbow Extension: AROM;10 reps Forearm Supination: AROM;10 reps Forearm Pronation: AROM;10 reps Wrist Flexion: AROM;10 reps Wrist Extension: AROM;10 reps      Wrist Exercises Forearm Supination: AROM;10 reps Forearm Pronation: AROM;10 reps Wrist Flexion: AROM;10 reps Wrist Extension: AROM;10 reps   Manual Therapy Manual Therapy: Myofascial release Myofascial Release: MFR with NO PROM only AROM to left upper extremity to decrease restrictions and edema.  Occupational Therapy Assessment and Plan OT Assessment and Plan Clinical Impression Statement: A:  Added AROM of shoulder, elbow, forearm and wrist.  Patient's fingers turned blue with AROM but decreased to normal by the time she left.  Advised patient if her fingers turned blue again to call the doctor. Rehab Potential: Excellent OT Plan: P:  Continue per protocol.   Goals Short Term Goals Time to Complete Short Term Goals: 4 weeks Short Term Goal 1: Patient will be educated on a HEP. Short Term Goal 2: Patient will increase PROM to  [facility] in her LUE for increased ability to complete ADLS> Short Term Goal 3: Patient will decrease fascial restrictions to moderate in her left arm. Short Term Goal 4: Patient will decrease pain to 2/10 in her left elbow region. Short Term Goal 5: Assess left arm and hand strength. Long Term Goals Time to Complete Long Term Goals: 8 weeks Long Term Goal 1: Patient will be able to play her guitar and complete all daily activities without difficulty. Long Term Goal 2: Patient will increase LUE AROM to WNL for increased ability to complete all daily activities. Long Term Goal 3: Patient will improve light touch sensation in her left ring and small digits from impaired to intact. Long Term Goal 4: Patient will increase left arm strength to 5/5 for increased ability to lift household items. Long Term Goal 5: Patient will have functional grip and pinch strength in her left hand. Additional Long Term Goals?: Yes Long Term Goal 6: Patient will have 1/10 pain in her left elbow when playing the guitar. Long Term Goal 7: Patient will have trace fascial restrictions in her left upper extremity.  Problem List Patient Active Problem List  Diagnoses  . S/P cubital tunnel release  . Ulnar nerve compression  . Pain in joint, forearm    End of Session Activity Tolerance: Patient tolerated treatment well General Behavior During Session: [facility] for tasks performed Cognition: [facility] for tasks performed  GO No functional reporting required  Laquan Beier L. Shatana Saxton, COTA/L  04/21/2011, 3:29 PM

## 2011-04-28 ENCOUNTER — Ambulatory Visit (HOSPITAL_COMMUNITY)
Admission: RE | Admit: 2011-04-28 | Discharge: 2011-04-28 | Disposition: A | Discharge status: Home / Self Care | Payer: Medicaid Other | Source: Ambulatory Visit | Attending: Pediatrics | Admitting: Pediatrics

## 2011-04-28 DIAGNOSIS — G5620 Lesion of ulnar nerve, unspecified upper limb: Secondary | ICD-10-CM

## 2011-04-28 DIAGNOSIS — M25539 Pain in unspecified wrist: Secondary | ICD-10-CM

## 2011-04-28 DIAGNOSIS — Z9889 Other specified postprocedural states: Secondary | ICD-10-CM

## 2011-04-28 NOTE — Progress Notes (Addendum)
Occupational Therapy Treatment  Patient Details  Name: SHAWNTIA MANGAL MRN: [id number] Date of Birth: 1960/06/05  Today's Date: 04/28/2011 Time: 4098-1191 Time Calculation (min): 33 min  Visit#: 3  of 8   Re-eval: 05/12/11 Assessment Diagnosis: L Ulnar Nerve Reposition and Cubital Tunnel Release In situ Surgical Date: 04/01/11 Next MD Visit: 04/29/11  Subjective Symptoms/Limitations Symptoms: S:  I took a pain pill about an hour ago so it would not hurt too much. Pain Assessment Currently in Pain?: Yes Pain Score:   4 Pain Location: Elbow Pain Orientation: Left  Precautions/Restrictions  Precautions Precaution Comments: Please see scanned protocol - AROM at elbow x 1 week, at 3 week post op add AROM forearm, wrist, at 5 weeks PROM.  Exercise/Treatments Supine Protraction: AROM;15 reps Horizontal ABduction: AROM;15 reps External Rotation: AROM;15 reps Internal Rotation: AROM;15 reps Flexion: AROM;15 reps ABduction: AROM;15 reps   Elbow Exercises Elbow Flexion: AROM;10 reps Elbow Extension: AROM;10 reps Forearm Supination: AROM;10 reps Forearm Pronation: AROM;10 reps Wrist Flexion: AROM;10 reps Wrist Extension: AROM;10 reps      Wrist Exercises Forearm Supination: AROM;10 reps Forearm Pronation: AROM;10 reps Wrist Flexion: AROM;10 reps Wrist Extension: AROM;10 reps           Manual Therapy Myofascial Release: MFR with NO PROM only AROM to left upper extremity to decrease restrictions and edema  Occupational Therapy Assessment and Plan OT Assessment and Plan Clinical Impression Statement: A:  Did not notice fingers turning blue after AROM today. OT Plan: P:  Continue per protocol   Goals Short Term Goals Time to Complete Short Term Goals: 4 weeks Short Term Goal 1: Patient will be educated on a HEP. Short Term Goal 2: Patient will increase PROM to [facility] in her LUE for increased ability to complete ADLS> Short Term Goal 3: Patient will decrease fascial  restrictions to moderate in her left arm. Short Term Goal 4: Patient will decrease pain to 2/10 in her left elbow region. Short Term Goal 5: Assess left arm and hand strength. Long Term Goals Time to Complete Long Term Goals: 8 weeks Long Term Goal 1: Patient will be able to play her guitar and complete all daily activities without difficulty. Long Term Goal 2: Patient will increase LUE AROM to WNL for increased ability to complete all daily activities. Long Term Goal 3: Patient will improve light touch sensation in her left ring and small digits from impaired to intact. Long Term Goal 4: Patient will increase left arm strength to 5/5 for increased ability to lift household items. Long Term Goal 5: Patient will have functional grip and pinch strength in her left hand. Additional Long Term Goals?: Yes Long Term Goal 6: Patient will have 1/10 pain in her left elbow when playing the guitar. Long Term Goal 7: Patient will have trace fascial restrictions in her left upper extremity.  Problem List Patient Active Problem List  Diagnoses  . S/P cubital tunnel release  . Ulnar nerve compression  . Pain in joint, forearm    End of Session Activity Tolerance: Patient tolerated treatment well General Behavior During Session: [facility] for tasks performed Cognition: [facility] for tasks performed  GO No functional reporting required  Hira Trent L. Liyana Suniga, COTA/L  04/28/2011, 1:46 PM

## 2011-07-21 ENCOUNTER — Encounter (HOSPITAL_COMMUNITY): Payer: Self-pay | Admitting: Emergency Medicine

## 2011-07-21 ENCOUNTER — Emergency Department (HOSPITAL_COMMUNITY)
Admission: EM | Admit: 2011-07-21 | Discharge: 2011-07-21 | Disposition: A | Discharge status: Home / Self Care | Payer: Medicaid Other | Attending: General Surgery | Admitting: General Surgery

## 2011-07-21 DIAGNOSIS — G4730 Sleep apnea, unspecified: Secondary | ICD-10-CM | POA: Insufficient documentation

## 2011-07-21 DIAGNOSIS — G2581 Restless legs syndrome: Secondary | ICD-10-CM | POA: Insufficient documentation

## 2011-07-21 DIAGNOSIS — G8929 Other chronic pain: Secondary | ICD-10-CM

## 2011-07-21 DIAGNOSIS — K219 Gastro-esophageal reflux disease without esophagitis: Secondary | ICD-10-CM | POA: Insufficient documentation

## 2011-07-21 DIAGNOSIS — M549 Dorsalgia, unspecified: Secondary | ICD-10-CM | POA: Insufficient documentation

## 2011-07-21 DIAGNOSIS — F17200 Nicotine dependence, unspecified, uncomplicated: Secondary | ICD-10-CM | POA: Insufficient documentation

## 2011-07-21 MED ORDER — DIAZEPAM 5 MG PO TABS: ORAL_TABLET | ORAL | Status: DC

## 2011-07-21 MED ORDER — HYDROCODONE-ACETAMINOPHEN 5-325 MG PO TABS: 1.0000 | ORAL_TABLET | ORAL | Status: AC | PRN

## 2011-07-21 MED ORDER — DEXAMETHASONE 4 MG PO TABS: ORAL_TABLET | ORAL | Status: DC

## 2011-07-21 NOTE — ED Provider Notes (Signed)
History     CSN: [id number]  Arrival date & time 07/21/11  1225   First MD Initiated Contact with Patient 07/21/11 1423      Chief Complaint  Patient presents with  . Back Pain    (Consider location/radiation/quality/duration/timing/severity/associated sxs/prior treatment) HPI Comments: Patient states that she has chronic back pain. States she has been diagnosed with a pinched nerve in her back. She states that her doctor is no longer prescribing any pain medications for any of his patients. The patient's primary physician is attempting to get a pain management physician for Ms. Goins, but has been unsuccessful to this point. Patient states that she" just can't take the pain anymore".  Patient is a 51 y.o. female presenting with back pain. The history is provided by the patient.  Back Pain  This is a chronic problem. The current episode started more than 1 week ago. The problem occurs daily. The problem has been gradually worsening. The pain is associated with no known injury. The pain is present in the lumbar spine. The quality of the pain is described as shooting and aching. The pain does not radiate. The pain is moderate. The symptoms are aggravated by certain positions. The pain is the same all the time. Pertinent negatives include no chest pain, no abdominal pain, no bowel incontinence, no perianal numbness, no bladder incontinence and no dysuria. She has tried analgesics for the symptoms. The treatment provided no relief.    Past Medical History  Diagnosis Date  . Shortness of breath   . Sleep apnea   . GERD (gastroesophageal reflux disease)   . Restless leg syndrome   . Pneumonia   . Headache   . Bulging disc     Past Surgical History  Procedure Date  . Cholecystectomy   . Oophorectomy   . Tubal ligation   . Bladder suspension   . Cardiac catheterization   . Ulnar nerve transposition 04/01/2011    Procedure: ULNAR NERVE DECOMPRESSION/TRANSPOSITION;  Surgeon: Dominica Severin, MD;  Location: [facility] OR;  Service: Orthopedics;  Laterality: Left;  ULNAR NERVE RELEASE WITH ANTERIOR TRANS POSITION REPAIR RECONSTRUCTION AS NECESSARY    History reviewed. No pertinent family history.  History  Substance Use Topics  . Smoking status: Current Everyday Smoker -- 1.0 packs/day for 20 years    Types: Cigarettes  . Smokeless tobacco: Not on file  . Alcohol Use: No    OB History    Grav Para Term Preterm Abortions TAB SAB Ect Mult Living                  Review of Systems  Constitutional: Negative for activity change.       All ROS Neg except as noted in HPI  HENT: Negative for nosebleeds and neck pain.   Eyes: Negative for photophobia and discharge.  Respiratory: Negative for cough, shortness of breath and wheezing.   Cardiovascular: Negative for chest pain and palpitations.  Gastrointestinal: Negative for abdominal pain, blood in stool and bowel incontinence.  Genitourinary: Negative for bladder incontinence, dysuria, frequency and hematuria.  Musculoskeletal: Positive for back pain. Negative for arthralgias.  Skin: Negative.   Neurological: Negative for dizziness, seizures and speech difficulty.  Psychiatric/Behavioral: Negative for hallucinations and confusion.    Allergies  Duragesic disc transdermal system  Home Medications   Current Outpatient Rx  Name Route Sig Dispense Refill  . ALBUTEROL SULFATE HFA 108 (90 BASE) MCG/ACT IN AERS Inhalation Inhale 2 puffs into the lungs every 6 (six)  hours as needed. Shortness of breath    . CLONAZEPAM 1 MG PO TABS Oral Take 2 mg by mouth at bedtime.    [name] DOXEPIN HCL 25 MG PO CAPS Oral Take 25-50 mg by mouth at bedtime. Takes 25-50 mg depending on leg pain    . LEVOTHYROXINE SODIUM 125 MCG PO TABS Oral Take 125 mcg by mouth daily.    [name] OMEPRAZOLE 20 MG PO CPDR Oral Take 20 mg by mouth daily as needed. Acid reflux    . PREGABALIN 225 MG PO CAPS Oral Take 225 mg by mouth 2 (two) times daily.    [name] DEXAMETHASONE 4  MG PO TABS  2 tablets day one, then 1 tablet daily until all taken. 8 tablet 0  . DIAZEPAM 5 MG PO TABS  1 po tid for spasm 15 tablet 0  . HYDROCODONE-ACETAMINOPHEN 5-325 MG PO TABS Oral Take 1 tablet by mouth every 4 (four) hours as needed for pain. 15 tablet 0    BP 136/85  Pulse 86  Temp 98.2 F (36.8 C) (Oral)  Resp 18  Ht 5' 0.75" (1.543 m)  Wt 180 lb (81.647 kg)  BMI 34.29 kg/m2  SpO2 96%  Physical Exam  Nursing note and vitals reviewed. Constitutional: She is oriented to person, place, and time. She appears well-developed and well-nourished.  Non-toxic appearance.  HENT:  Head: Normocephalic.  Right Ear: Tympanic membrane and external ear normal.  Left Ear: Tympanic membrane and external ear normal.  Eyes: EOM and lids are normal. Pupils are equal, round, and reactive to light.  Neck: Normal range of motion. Neck supple. Carotid bruit is not present.  Cardiovascular: Normal rate, regular rhythm, normal heart sounds, intact distal pulses and normal pulses.   Pulmonary/Chest: Breath sounds normal. No respiratory distress.  Abdominal: Soft. Bowel sounds are normal. There is no tenderness. There is no guarding.  Musculoskeletal: Normal range of motion.       There is pain to palpation and attempted range of motion of the lumbar area. Right more than left. There is no palpable step off.  Lymphadenopathy:       Head (right side): No submandibular adenopathy present.       Head (left side): No submandibular adenopathy present.    She has no cervical adenopathy.  Neurological: She is alert and oriented to person, place, and time. She has normal strength. No cranial nerve deficit or sensory deficit.       There is mild weakness of the right lower extremity. Patient states that this is not new for her, and she has been having some weakness since being diagnosed with a" pinched nerves  Skin: Skin is warm and dry.  Psychiatric: She has a normal mood and affect. Her speech is normal.     ED Course  Procedures (including critical care time)  Labs Reviewed - No data to display No results found.   1. Back pain, chronic       MDM  I have reviewed nursing notes, vital signs, and all appropriate lab and imaging results for this patient. Patient has a" pinched nerve in her back". She presents for assistance with her pain because her physician is not writing any pain medication for any of his patient's. The patient also states that she is in the midst of getting a pain management physician but this has not started yet. No gross neurologic deficits appreciated at this time. Patient is prescribed Valium 5 mg 3 times daily #15, Decadron 4  mg, and Norco #15 tablets.       Kathie Dike, [place] 07/21/11 1431

## 2011-07-21 NOTE — ED Notes (Signed)
History of pinched nerve. States PCP was trying to get her a pain management doctor but has been unsuccessful.

## 2011-07-21 NOTE — ED Provider Notes (Signed)
Medical screening examination/treatment/procedure(s) were performed by non-physician practitioner and as supervising physician I was immediately available for consultation/collaboration.   Arnelle Nale L Beauden Tremont, MD 07/21/11 1559 

## 2011-08-03 ENCOUNTER — Other Ambulatory Visit: Payer: Self-pay | Admitting: Family Medicine

## 2011-09-08 ENCOUNTER — Encounter (HOSPITAL_COMMUNITY): Payer: Self-pay

## 2011-09-08 ENCOUNTER — Emergency Department (HOSPITAL_COMMUNITY)
Admission: EM | Admit: 2011-09-08 | Discharge: 2011-09-08 | Disposition: A | Discharge status: Home / Self Care | Payer: Medicaid Other | Attending: Emergency Medicine | Admitting: Emergency Medicine

## 2011-09-08 DIAGNOSIS — F17200 Nicotine dependence, unspecified, uncomplicated: Secondary | ICD-10-CM | POA: Insufficient documentation

## 2011-09-08 DIAGNOSIS — J0100 Acute maxillary sinusitis, unspecified: Secondary | ICD-10-CM | POA: Insufficient documentation

## 2011-09-08 DIAGNOSIS — K219 Gastro-esophageal reflux disease without esophagitis: Secondary | ICD-10-CM | POA: Insufficient documentation

## 2011-09-08 DIAGNOSIS — I2510 Atherosclerotic heart disease of native coronary artery without angina pectoris: Secondary | ICD-10-CM | POA: Insufficient documentation

## 2011-09-08 HISTORY — DX: Chronic obstructive pulmonary disease, unspecified: J44.9

## 2011-09-08 HISTORY — DX: Atherosclerotic heart disease of native coronary artery without angina pectoris: I25.10

## 2011-09-08 MED ORDER — AMOXICILLIN 500 MG PO CAPS: 500.0000 mg | ORAL_CAPSULE | Freq: Three times a day (TID) | ORAL | Status: AC

## 2011-09-08 MED ORDER — CETIRIZINE-PSEUDOEPHEDRINE ER 5-120 MG PO TB12: 1.0000 | ORAL_TABLET | Freq: Every day | ORAL | Status: DC

## 2011-09-08 NOTE — ED Provider Notes (Signed)
Medical screening examination/treatment/procedure(s) were performed by non-physician practitioner and as supervising physician I was immediately available for consultation/collaboration. Devoria Albe, MD, Armando Gang   Ward Givens, MD 09/08/11 [phone]

## 2011-09-08 NOTE — ED Provider Notes (Signed)
History     CSN: [id number]  Arrival date & time 09/08/11  0841   First MD Initiated Contact with Patient 09/08/11 [phone]      Chief Complaint  Patient presents with  . Facial Pain    "sinus infection"    (Consider location/radiation/quality/duration/timing/severity/associated sxs/prior treatment) HPI Comments: Patricia Bass presents with a 3 week history of increasing nasal congestion with purulent nasal discharge, bilateral facial pain and postnasal drip.  Nasal secretions have been thick and yellow to green without blood streaking.  She has had subjective fever.  She denies shortness of breath, wheezing or coughing, stating her COPD has been under good control.  She is using warm saline nasal rinses and had been on Zyrtec which she has run out of.  She does get temporary relief from mental cough drops which provide improvement in her sinus pressure.  She also describes a bilateral ear pressure with intermittent decreased hearing acuity, describing muffled hearing.  She has had no ear drainage or pain.  The history is provided by the patient.    Past Medical History  Diagnosis Date  . Shortness of breath   . Sleep apnea   . GERD (gastroesophageal reflux disease)   . Restless leg syndrome   . Pneumonia   . Headache   . Bulging disc   . COPD (chronic obstructive pulmonary disease)   . Coronary artery disease     Past Surgical History  Procedure Date  . Cholecystectomy   . Oophorectomy   . Tubal ligation   . Bladder suspension   . Cardiac catheterization   . Ulnar nerve transposition 04/01/2011    Procedure: ULNAR NERVE DECOMPRESSION/TRANSPOSITION;  Surgeon: Dominica Severin, MD;  Location: [facility] OR;  Service: Orthopedics;  Laterality: Left;  ULNAR NERVE RELEASE WITH ANTERIOR TRANS POSITION REPAIR RECONSTRUCTION AS NECESSARY    No family history on file.  History  Substance Use Topics  . Smoking status: Current Everyday Smoker -- 1.0 packs/day for 20 years    Types:  Cigarettes  . Smokeless tobacco: Not on file  . Alcohol Use: No    OB History    Grav Para Term Preterm Abortions TAB SAB Ect Mult Living                  Review of Systems  Constitutional: Positive for fever.  HENT: Positive for congestion, rhinorrhea, postnasal drip and sinus pressure. Negative for ear pain, sore throat, facial swelling, trouble swallowing, neck pain, neck stiffness, voice change and ear discharge.   Eyes: Negative.   Respiratory: Negative for chest tightness, shortness of breath and wheezing.   Cardiovascular: Negative for chest pain.  Gastrointestinal: Negative for nausea and abdominal pain.  Genitourinary: Negative.   Musculoskeletal: Negative for joint swelling and arthralgias.  Skin: Negative.  Negative for rash and wound.  Neurological: Negative for dizziness, weakness, light-headedness, numbness and headaches.  Hematological: Negative.   Psychiatric/Behavioral: Negative.     Allergies  Duragesic disc transdermal system  Home Medications   Current Outpatient Rx  Name Route Sig Dispense Refill  . ALBUTEROL SULFATE HFA 108 (90 BASE) MCG/ACT IN AERS Inhalation Inhale 2 puffs into the lungs every 6 (six) hours as needed. Shortness of breath    . AMOXICILLIN 500 MG PO CAPS Oral Take 1 capsule (500 mg total) by mouth 3 (three) times daily. 30 capsule 0  . CETIRIZINE-PSEUDOEPHEDRINE ER 5-120 MG PO TB12 Oral Take 1 tablet by mouth daily. 30 tablet 0  . CLONAZEPAM 1 MG  PO TABS Oral Take 2 mg by mouth at bedtime.    [name] DEXAMETHASONE 4 MG PO TABS  2 tablets day one, then 1 tablet daily until all taken. 8 tablet 0  . DIAZEPAM 5 MG PO TABS  1 po tid for spasm 15 tablet 0  . DOXEPIN HCL 25 MG PO CAPS Oral Take 25-50 mg by mouth at bedtime. Takes 25-50 mg depending on leg pain    . LEVOTHYROXINE SODIUM 125 MCG PO TABS Oral Take 125 mcg by mouth daily.    [name] OMEPRAZOLE 20 MG PO CPDR Oral Take 20 mg by mouth daily as needed. Acid reflux    . PREGABALIN 225 MG PO CAPS  Oral Take 225 mg by mouth 2 (two) times daily.      BP 131/88  Pulse 106  Temp 98.8 F (37.1 C) (Oral)  Resp 22  Ht 5\' 3"  (1.6 m)  Wt 180 lb (81.647 kg)  BMI 31.89 kg/m2  SpO2 95%  Physical Exam  Constitutional: She is oriented to person, place, and time. She appears well-developed and well-nourished.  HENT:  Head: Normocephalic and atraumatic.  Right Ear: Ear canal normal. No mastoid tenderness. Tympanic membrane is retracted. Tympanic membrane is not injected and not bulging.  Left Ear: Ear canal normal. No mastoid tenderness. Tympanic membrane is retracted. Tympanic membrane is not injected and not bulging.  Nose: Mucosal edema and rhinorrhea present. Right sinus exhibits maxillary sinus tenderness. Left sinus exhibits maxillary sinus tenderness.  Mouth/Throat: Uvula is midline, oropharynx is clear and moist and mucous membranes are normal. No oropharyngeal exudate, posterior oropharyngeal edema, posterior oropharyngeal erythema or tonsillar abscesses.       Purulent nasal discharge present.  Eyes: Conjunctivae are normal.  Pulmonary/Chest: Effort normal. No respiratory distress. She has no wheezes. She has no rales.  Abdominal: Soft. There is no tenderness.  Musculoskeletal: Normal range of motion.  Neurological: She is alert and oriented to person, place, and time.  Skin: Skin is warm and dry. No rash noted.  Psychiatric: She has a normal mood and affect.    ED Course  Procedures (including critical care time)  Labs Reviewed - No data to display No results found.   1. Sinusitis, acute maxillary       MDM  Patient prescribed amoxicillin and Zyrtec D.  Also encouraged to continue with menthol cough drops and nasal saline spray.  Recheck by PCP if not improving over the next week.  The patient appears reasonably screened and/or stabilized for discharge and I doubt any other medical condition or other [facility] requiring further screening, evaluation, or treatment in the ED at  this time prior to discharge.         Burgess Amor, PA 09/08/11 0930

## 2011-09-08 NOTE — ED Notes (Signed)
Pt reports having a sinus infection for 1 month, not getting any better

## 2011-10-08 DIAGNOSIS — E039 Hypothyroidism, unspecified: Secondary | ICD-10-CM | POA: Diagnosis not present

## 2011-10-08 DIAGNOSIS — G8929 Other chronic pain: Secondary | ICD-10-CM | POA: Diagnosis not present

## 2011-10-08 DIAGNOSIS — J449 Chronic obstructive pulmonary disease, unspecified: Secondary | ICD-10-CM | POA: Diagnosis not present

## 2011-10-08 DIAGNOSIS — Z23 Encounter for immunization: Secondary | ICD-10-CM | POA: Diagnosis not present

## 2011-10-08 DIAGNOSIS — IMO0001 Reserved for inherently not codable concepts without codable children: Secondary | ICD-10-CM | POA: Diagnosis not present

## 2012-01-10 ENCOUNTER — Other Ambulatory Visit: Payer: Self-pay

## 2012-01-10 DIAGNOSIS — E039 Hypothyroidism, unspecified: Secondary | ICD-10-CM | POA: Diagnosis not present

## 2012-01-10 DIAGNOSIS — R0602 Shortness of breath: Secondary | ICD-10-CM

## 2012-01-10 DIAGNOSIS — IMO0001 Reserved for inherently not codable concepts without codable children: Secondary | ICD-10-CM | POA: Diagnosis not present

## 2012-01-10 DIAGNOSIS — J449 Chronic obstructive pulmonary disease, unspecified: Secondary | ICD-10-CM | POA: Diagnosis not present

## 2012-01-10 DIAGNOSIS — M545 Low back pain: Secondary | ICD-10-CM | POA: Diagnosis not present

## 2012-01-11 DIAGNOSIS — E1165 Type 2 diabetes mellitus with hyperglycemia: Secondary | ICD-10-CM | POA: Insufficient documentation

## 2012-01-11 DIAGNOSIS — E119 Type 2 diabetes mellitus without complications: Secondary | ICD-10-CM | POA: Insufficient documentation

## 2012-01-11 HISTORY — DX: Type 2 diabetes mellitus without complications: E11.9

## 2012-01-24 ENCOUNTER — Emergency Department (HOSPITAL_COMMUNITY)
Admission: EM | Admit: 2012-01-24 | Discharge: 2012-01-24 | Disposition: A | Discharge status: Home / Self Care | Payer: Medicaid Other | Attending: Emergency Medicine | Admitting: Emergency Medicine

## 2012-01-24 ENCOUNTER — Emergency Department (HOSPITAL_COMMUNITY): Payer: Medicaid Other

## 2012-01-24 ENCOUNTER — Encounter (HOSPITAL_COMMUNITY): Payer: Self-pay | Admitting: Emergency Medicine

## 2012-01-24 DIAGNOSIS — J029 Acute pharyngitis, unspecified: Secondary | ICD-10-CM | POA: Insufficient documentation

## 2012-01-24 DIAGNOSIS — IMO0002 Reserved for concepts with insufficient information to code with codable children: Secondary | ICD-10-CM | POA: Insufficient documentation

## 2012-01-24 DIAGNOSIS — J3489 Other specified disorders of nose and nasal sinuses: Secondary | ICD-10-CM | POA: Insufficient documentation

## 2012-01-24 DIAGNOSIS — Z8701 Personal history of pneumonia (recurrent): Secondary | ICD-10-CM | POA: Insufficient documentation

## 2012-01-24 DIAGNOSIS — R51 Headache: Secondary | ICD-10-CM | POA: Insufficient documentation

## 2012-01-24 DIAGNOSIS — Z8739 Personal history of other diseases of the musculoskeletal system and connective tissue: Secondary | ICD-10-CM | POA: Insufficient documentation

## 2012-01-24 DIAGNOSIS — F17200 Nicotine dependence, unspecified, uncomplicated: Secondary | ICD-10-CM | POA: Insufficient documentation

## 2012-01-24 DIAGNOSIS — Z8669 Personal history of other diseases of the nervous system and sense organs: Secondary | ICD-10-CM | POA: Insufficient documentation

## 2012-01-24 DIAGNOSIS — IMO0001 Reserved for inherently not codable concepts without codable children: Secondary | ICD-10-CM | POA: Insufficient documentation

## 2012-01-24 DIAGNOSIS — B9789 Other viral agents as the cause of diseases classified elsewhere: Secondary | ICD-10-CM | POA: Insufficient documentation

## 2012-01-24 DIAGNOSIS — J449 Chronic obstructive pulmonary disease, unspecified: Secondary | ICD-10-CM | POA: Insufficient documentation

## 2012-01-24 DIAGNOSIS — I2510 Atherosclerotic heart disease of native coronary artery without angina pectoris: Secondary | ICD-10-CM | POA: Insufficient documentation

## 2012-01-24 DIAGNOSIS — R509 Fever, unspecified: Secondary | ICD-10-CM | POA: Insufficient documentation

## 2012-01-24 DIAGNOSIS — B349 Viral infection, unspecified: Secondary | ICD-10-CM

## 2012-01-24 DIAGNOSIS — J4489 Other specified chronic obstructive pulmonary disease: Secondary | ICD-10-CM | POA: Insufficient documentation

## 2012-01-24 DIAGNOSIS — Z8709 Personal history of other diseases of the respiratory system: Secondary | ICD-10-CM | POA: Insufficient documentation

## 2012-01-24 DIAGNOSIS — Z79899 Other long term (current) drug therapy: Secondary | ICD-10-CM | POA: Insufficient documentation

## 2012-01-24 DIAGNOSIS — E876 Hypokalemia: Secondary | ICD-10-CM

## 2012-01-24 DIAGNOSIS — K219 Gastro-esophageal reflux disease without esophagitis: Secondary | ICD-10-CM | POA: Insufficient documentation

## 2012-01-24 LAB — CBC WITH DIFFERENTIAL/PLATELET
Basophils Absolute: 0 10*3/uL (ref 0.0–0.1)
Basophils Relative: 0 % (ref 0–1)
Hemoglobin: 13.8 g/dL (ref 12.0–15.0)
MCHC: 33.8 g/dL (ref 30.0–36.0)
Monocytes Relative: 4 % (ref 3–12)
Neutro Abs: 11.6 10*3/uL — ABNORMAL HIGH (ref 1.7–7.7)
Neutrophils Relative %: 89 % — ABNORMAL HIGH (ref 43–77)
RDW: 14.7 % (ref 11.5–15.5)
WBC: 13 10*3/uL — ABNORMAL HIGH (ref 4.0–10.5)

## 2012-01-24 LAB — BASIC METABOLIC PANEL
Chloride: 95 mEq/L — ABNORMAL LOW (ref 96–112)
GFR calc Af Amer: >90 mL/min (ref >90)
Potassium: 2.7 mEq/L — CL (ref 3.5–5.1)

## 2012-01-24 MED ORDER — IPRATROPIUM BROMIDE 0.02 % IN SOLN
0.5000 mg | Freq: Once | RESPIRATORY_TRACT | Status: AC
  Administered 2012-01-24: 0.5 mg via RESPIRATORY_TRACT
  Filled 2012-01-24: qty 2.5

## 2012-01-24 MED ORDER — ALBUTEROL SULFATE (5 MG/ML) 0.5% IN NEBU
5.0000 mg | INHALATION_SOLUTION | Freq: Once | RESPIRATORY_TRACT | Status: AC
  Administered 2012-01-24: 5 mg via RESPIRATORY_TRACT
  Filled 2012-01-24: qty 1

## 2012-01-24 MED ORDER — POTASSIUM CHLORIDE ER 10 MEQ PO TBCR: 20.0000 meq | EXTENDED_RELEASE_TABLET | Freq: Two times a day (BID) | ORAL | Status: DC

## 2012-01-24 MED ORDER — ONDANSETRON HCL 4 MG/2ML IJ SOLN
4.0000 mg | Freq: Once | INTRAMUSCULAR | Status: AC
  Administered 2012-01-24: 4 mg via INTRAVENOUS
  Filled 2012-01-24: qty 2

## 2012-01-24 MED ORDER — SODIUM CHLORIDE 0.9 % IV SOLN
Freq: Once | INTRAVENOUS | Status: AC
  Administered 2012-01-24: 11:00:00 via INTRAVENOUS

## 2012-01-24 MED ORDER — OSELTAMIVIR PHOSPHATE 75 MG PO CAPS: 75.0000 mg | ORAL_CAPSULE | Freq: Two times a day (BID) | ORAL | Status: DC

## 2012-01-24 MED ORDER — POTASSIUM CHLORIDE CRYS ER 20 MEQ PO TBCR
40.0000 meq | EXTENDED_RELEASE_TABLET | Freq: Once | ORAL | Status: AC
  Administered 2012-01-24: 40 meq via ORAL
  Filled 2012-01-24: qty 2

## 2012-01-24 MED ORDER — GUAIFENESIN-CODEINE 100-10 MG/5ML PO SYRP: 10.0000 mL | ORAL_SOLUTION | Freq: Three times a day (TID) | ORAL | Status: AC | PRN

## 2012-01-24 NOTE — ED Notes (Signed)
Cough productive of yellow sputum, body aches that started about 2 days ago, states she has been running a fever.

## 2012-01-24 NOTE — ED Notes (Signed)
Oxygen saturation on room air noted to be 88-89%, pt placed on 2lpm O2 via nasal canula with improvement in O2 sats to 96%.

## 2012-01-24 NOTE — ED Notes (Signed)
Flu like symptoms started on Saturday. Pt states she did get flu shot like three months ago.body aches, fever, headache, coughing up yellow sputum'

## 2012-01-24 NOTE — ED Notes (Signed)
Lab called critical potassium of 2.7 MD aware

## 2012-01-27 NOTE — ED Provider Notes (Signed)
History     CSN: [id number]  Arrival date & time 01/24/12  1478   First MD Initiated Contact with Patient 01/24/12 [phone]      Chief Complaint  Patient presents with  . Influenza    (Consider location/radiation/quality/duration/timing/severity/associated sxs/prior treatment) HPI Comments: Patient c/o nasal congestion, cough, fever, frontal headache, body aches and chills for 2 days.  States that she has a productive cough at times.  She denies shortness of breath, wheezing, chest pan or vomiting.  States that she did receive a flu shot several months ago.    Patient is a 52 y.o. female presenting with flu symptoms. The history is provided by the patient.  Influenza This is a new problem. The current episode started in the past 7 days. The problem occurs constantly. The problem has been unchanged. Associated symptoms include chills, congestion, coughing, a fever, headaches, myalgias and a sore throat. Pertinent negatives include no abdominal pain, arthralgias, change in bowel habit, chest pain, nausea, neck pain, numbness, rash, urinary symptoms, vomiting or weakness. Nothing aggravates the symptoms. She has tried acetaminophen for the symptoms. The treatment provided no relief.    Past Medical History  Diagnosis Date  . Shortness of breath   . Sleep apnea   . GERD (gastroesophageal reflux disease)   . Restless leg syndrome   . Pneumonia   . Headache   . Bulging disc   . COPD (chronic obstructive pulmonary disease)   . Coronary artery disease     Past Surgical History  Procedure Date  . Cholecystectomy   . Oophorectomy   . Tubal ligation   . Bladder suspension   . Cardiac catheterization   . Ulnar nerve transposition 04/01/2011    Procedure: ULNAR NERVE DECOMPRESSION/TRANSPOSITION;  Surgeon: Dominica Severin, MD;  Location: [facility] OR;  Service: Orthopedics;  Laterality: Left;  ULNAR NERVE RELEASE WITH ANTERIOR TRANS POSITION REPAIR RECONSTRUCTION AS NECESSARY    History reviewed.  No pertinent family history.  History  Substance Use Topics  . Smoking status: Current Every Day Smoker -- 1.0 packs/day for 20 years    Types: Cigarettes  . Smokeless tobacco: Not on file  . Alcohol Use: No    OB History    Grav Para Term Preterm Abortions TAB SAB Ect Mult Living                  Review of Systems  Constitutional: Positive for fever and chills.  HENT: Positive for congestion, sore throat and rhinorrhea. Negative for trouble swallowing and neck pain.   Eyes: Negative for visual disturbance.  Respiratory: Positive for cough. Negative for chest tightness, shortness of breath and wheezing.   Cardiovascular: Negative for chest pain.  Gastrointestinal: Negative for nausea, vomiting, abdominal pain and change in bowel habit.  Genitourinary: Negative for dysuria and urgency.  Musculoskeletal: Positive for myalgias. Negative for arthralgias.  Skin: Negative for rash.  Neurological: Positive for headaches. Negative for dizziness, syncope, weakness and numbness.  All other systems reviewed and are negative.    Allergies  Duragesic disc transdermal system  Home Medications   Current Outpatient Rx  Name  Route  Sig  Dispense  Refill  . ALBUTEROL SULFATE HFA 108 (90 BASE) MCG/ACT IN AERS   Inhalation   Inhale 2 puffs into the lungs every 6 (six) hours as needed. Shortness of breath         . CLONAZEPAM 1 MG PO TABS   Oral   Take 2 mg by mouth at bedtime.         [name]  DEXAMETHASONE 4 MG PO TABS      2 tablets day one, then 1 tablet daily until all taken.   8 tablet   0   . DOXEPIN HCL 25 MG PO CAPS   Oral   Take 50 mg by mouth at bedtime. Takes 25-50 mg depending on leg pain         . HYDROCODONE-ACETAMINOPHEN 5-325 MG PO TABS   Oral   Take 1 tablet by mouth 3 (three) times daily as needed. pain         . IBUPROFEN 200 MG PO TABS   Oral   Take 400 mg by mouth every 6 (six) hours as needed. Pain         . LEVOTHYROXINE SODIUM 125 MCG PO TABS    Oral   Take 125 mcg by mouth daily.         [name] NAPHAZOLINE-GLYCERIN 0.012-0.2 % OP SOLN   Both Eyes   Place 1-2 drops into both eyes every 4 (four) hours as needed. Dry Eyes         . OMEPRAZOLE 20 MG PO CPDR   Oral   Take 20 mg by mouth daily as needed. Acid reflux         . PREGABALIN 300 MG PO CAPS   Oral   Take 300 mg by mouth 2 (two) times daily.         . GUAIFENESIN-CODEINE 100-10 MG/5ML PO SYRP   Oral   Take 10 mLs by mouth 3 (three) times daily as needed for cough.   120 mL   0   . OSELTAMIVIR PHOSPHATE 75 MG PO CAPS   Oral   Take 1 capsule (75 mg total) by mouth 2 (two) times daily. For 5 days   10 capsule   0   . POTASSIUM CHLORIDE ER 10 MEQ PO TBCR   Oral   Take 2 tablets (20 mEq total) by mouth 2 (two) times daily.   30 tablet   0     BP 105/63  Pulse 116  Temp 99.9 F (37.7 C) (Oral)  Resp 20  Ht 5\' 1"  (1.549 m)  Wt 175 lb (79.379 kg)  BMI 33.07 kg/m2  SpO2 98%  Physical Exam  Nursing note and vitals reviewed. Constitutional: She is oriented to person, place, and time. She appears well-developed and well-nourished. No distress.  HENT:  Head: Normocephalic and atraumatic. No trismus in the jaw.  Right Ear: Tympanic membrane and ear canal normal.  Left Ear: Tympanic membrane and ear canal normal.  Nose: Mucosal edema and rhinorrhea present.  Mouth/Throat: Uvula is midline and mucous membranes are normal. No uvula swelling. Posterior oropharyngeal erythema present. No oropharyngeal exudate, posterior oropharyngeal edema or tonsillar abscesses.  Neck: Normal range of motion, full passive range of motion without pain and phonation normal. Neck supple. No Brudzinski's sign and no Kernig's sign noted.  Cardiovascular: Normal rate, regular rhythm, normal heart sounds and intact distal pulses.   No murmur heard. Pulmonary/Chest: Effort normal and breath sounds normal. No respiratory distress. She has no wheezes. She has no rales.       Coarse lung  sounds bilaterally.    Abdominal: Soft. She exhibits no distension. There is no tenderness.  Musculoskeletal: She exhibits no edema.  Lymphadenopathy:    She has no cervical adenopathy.  Neurological: She is alert and oriented to person, place, and time. No cranial nerve deficit or sensory deficit. She exhibits normal muscle tone. Coordination and gait  normal.  Skin: Skin is warm and dry.    ED Course  Procedures (including critical care time)  Labs Reviewed  CBC WITH DIFFERENTIAL - Abnormal; Notable for the following:    WBC 13.0 (*)     Platelets 122 (*)     Neutrophils Relative 89 (*)     Neutro Abs 11.6 (*)     Lymphocytes Relative 7 (*)     All other components within normal limits  BASIC METABOLIC PANEL - Abnormal; Notable for the following:    Potassium 2.7 (*)     Chloride 95 (*)     Glucose, Bld 168 (*)     All other components within normal limits  LAB REPORT - SCANNED     1. Hypokalemia   2. Viral illness     Dg Chest 2 View  01/24/2012  *RADIOLOGY REPORT*  Clinical Data: Flu like symptoms.  CHEST - 2 VIEW  Comparison: 03/25/2011 for  Findings: Trachea is midline.  Heart size stable.  There may be minimal scarring at the right costophrenic angle.  Lungs are otherwise clear.  No pleural fluid.  Lungs appear mildly hyperinflated.  IMPRESSION: No acute findings.   Original Report Authenticated By: Leanna Battles, M.D.     MDM     Labs and XR reviewed and discussed findings with pt. Sx's likely related to influenza.   She is feeling better after IVF's, potassium, neb, and zofran.  Requesting to go home.  Vitals improved, ambulated to restroom w/o difficulty.  Appears stable for d/c.  She agrees to f/u with her PMD for recheck of K+.    Prescribed: Robitussin AC Potassium tamiflu   Emberli Ballester L. Tayari Yankee, [place] 01/27/12 1302

## 2012-01-31 NOTE — ED Provider Notes (Signed)
Medical screening examination/treatment/procedure(s) were performed by non-physician practitioner and as supervising physician I was immediately available for consultation/collaboration.   Shelda Jakes, MD 01/31/12 [phone]

## 2012-02-10 DIAGNOSIS — E039 Hypothyroidism, unspecified: Secondary | ICD-10-CM | POA: Diagnosis not present

## 2012-02-10 DIAGNOSIS — J449 Chronic obstructive pulmonary disease, unspecified: Secondary | ICD-10-CM | POA: Diagnosis not present

## 2012-02-10 DIAGNOSIS — F411 Generalized anxiety disorder: Secondary | ICD-10-CM | POA: Diagnosis not present

## 2012-02-11 ENCOUNTER — Other Ambulatory Visit (HOSPITAL_COMMUNITY): Payer: Self-pay | Admitting: Pulmonary Disease

## 2012-02-11 DIAGNOSIS — J449 Chronic obstructive pulmonary disease, unspecified: Secondary | ICD-10-CM

## 2012-02-14 ENCOUNTER — Other Ambulatory Visit: Payer: Self-pay | Admitting: Family Medicine

## 2012-02-21 ENCOUNTER — Ambulatory Visit (HOSPITAL_COMMUNITY)
Admission: RE | Admit: 2012-02-21 | Discharge: 2012-02-21 | Disposition: A | Discharge status: Home / Self Care | Payer: Medicaid Other | Source: Ambulatory Visit | Attending: Pulmonary Disease | Admitting: Pulmonary Disease

## 2012-02-21 DIAGNOSIS — J449 Chronic obstructive pulmonary disease, unspecified: Secondary | ICD-10-CM | POA: Insufficient documentation

## 2012-02-21 DIAGNOSIS — J4489 Other specified chronic obstructive pulmonary disease: Secondary | ICD-10-CM | POA: Insufficient documentation

## 2012-02-21 DIAGNOSIS — R0602 Shortness of breath: Secondary | ICD-10-CM | POA: Insufficient documentation

## 2012-02-21 LAB — BLOOD GAS, ARTERIAL
pCO2 arterial: 45.7 mmHg — ABNORMAL HIGH (ref 35.0–45.0)
pO2, Arterial: 55.6 mmHg — ABNORMAL LOW (ref 80.0–100.0)

## 2012-02-21 MED ORDER — ALBUTEROL SULFATE (5 MG/ML) 0.5% IN NEBU
2.5000 mg | INHALATION_SOLUTION | Freq: Once | RESPIRATORY_TRACT | Status: AC
  Administered 2012-02-21: 2.5 mg via RESPIRATORY_TRACT

## 2012-02-23 NOTE — Procedures (Signed)
Patricia Bass, Patricia Bass                ACCOUNT NO.:  [id number]  MEDICAL RECORD NO.:  [id number]  LOCATION:                                 FACILITY:  PHYSICIAN:  Katelan Hirt L. [name], M.D.DATE OF BIRTH:  [birth year]  DATE OF PROCEDURE:  02/22/2012 DATE OF DISCHARGE:                           PULMONARY FUNCTION TEST   REASON FOR PULMONARY FUNCTION TESTING:  COPD.  1. Spirometry shows no definite ventilatory defect, but does show     airflow obstruction.  This is most marked in the smaller airways. 2. Lung volumes show air trapping. 3. DLCO is moderately reduced, but corrects somewhat when ventilation     is taken into account. 4. Airway resistance is normal. 5. Blood gas shows significant resting hypoxia. 6. There is no significant bronchodilator improvement. 7. This study is consistent with clinical diagnosis of COPD.     Lindy Garczynski L. [name], M.D.     Patricia Bass  D:  02/22/2012  T:  02/23/2012  Job:  [id number]

## 2012-03-01 LAB — PULMONARY FUNCTION TEST

## 2012-05-01 DIAGNOSIS — IMO0001 Reserved for inherently not codable concepts without codable children: Secondary | ICD-10-CM | POA: Diagnosis not present

## 2012-05-01 DIAGNOSIS — E039 Hypothyroidism, unspecified: Secondary | ICD-10-CM | POA: Diagnosis not present

## 2012-05-01 DIAGNOSIS — J449 Chronic obstructive pulmonary disease, unspecified: Secondary | ICD-10-CM | POA: Diagnosis not present

## 2012-05-01 DIAGNOSIS — M545 Low back pain: Secondary | ICD-10-CM | POA: Diagnosis not present

## 2012-05-29 ENCOUNTER — Encounter (HOSPITAL_COMMUNITY): Payer: Self-pay

## 2012-05-29 ENCOUNTER — Emergency Department (HOSPITAL_COMMUNITY)
Admission: EM | Admit: 2012-05-29 | Discharge: 2012-05-29 | Disposition: A | Discharge status: Home / Self Care | Payer: Medicare Other | Attending: Emergency Medicine | Admitting: Emergency Medicine

## 2012-05-29 DIAGNOSIS — Z9861 Coronary angioplasty status: Secondary | ICD-10-CM | POA: Insufficient documentation

## 2012-05-29 DIAGNOSIS — R220 Localized swelling, mass and lump, head: Secondary | ICD-10-CM | POA: Diagnosis not present

## 2012-05-29 DIAGNOSIS — J4489 Other specified chronic obstructive pulmonary disease: Secondary | ICD-10-CM | POA: Insufficient documentation

## 2012-05-29 DIAGNOSIS — G4730 Sleep apnea, unspecified: Secondary | ICD-10-CM | POA: Diagnosis not present

## 2012-05-29 DIAGNOSIS — Z79899 Other long term (current) drug therapy: Secondary | ICD-10-CM | POA: Insufficient documentation

## 2012-05-29 DIAGNOSIS — L255 Unspecified contact dermatitis due to plants, except food: Secondary | ICD-10-CM | POA: Diagnosis not present

## 2012-05-29 DIAGNOSIS — L237 Allergic contact dermatitis due to plants, except food: Secondary | ICD-10-CM

## 2012-05-29 DIAGNOSIS — J449 Chronic obstructive pulmonary disease, unspecified: Secondary | ICD-10-CM | POA: Insufficient documentation

## 2012-05-29 DIAGNOSIS — F17200 Nicotine dependence, unspecified, uncomplicated: Secondary | ICD-10-CM | POA: Diagnosis not present

## 2012-05-29 DIAGNOSIS — I2510 Atherosclerotic heart disease of native coronary artery without angina pectoris: Secondary | ICD-10-CM | POA: Insufficient documentation

## 2012-05-29 DIAGNOSIS — Z8739 Personal history of other diseases of the musculoskeletal system and connective tissue: Secondary | ICD-10-CM | POA: Diagnosis not present

## 2012-05-29 DIAGNOSIS — Z8701 Personal history of pneumonia (recurrent): Secondary | ICD-10-CM | POA: Insufficient documentation

## 2012-05-29 DIAGNOSIS — K219 Gastro-esophageal reflux disease without esophagitis: Secondary | ICD-10-CM | POA: Insufficient documentation

## 2012-05-29 DIAGNOSIS — G2581 Restless legs syndrome: Secondary | ICD-10-CM | POA: Diagnosis not present

## 2012-05-29 MED ORDER — DIPHENHYDRAMINE HCL 25 MG PO CAPS
25.0000 mg | ORAL_CAPSULE | Freq: Once | ORAL | Status: AC
  Administered 2012-05-29: 25 mg via ORAL
  Filled 2012-05-29: qty 1

## 2012-05-29 MED ORDER — PREDNISONE 10 MG PO TABS: ORAL_TABLET | ORAL | Status: DC

## 2012-05-29 MED ORDER — DEXAMETHASONE SODIUM PHOSPHATE 4 MG/ML IJ SOLN
10.0000 mg | Freq: Once | INTRAMUSCULAR | Status: AC
  Administered 2012-05-29: 10 mg via INTRAMUSCULAR
  Filled 2012-05-29: qty 3

## 2012-05-29 NOTE — ED Notes (Signed)
Pt reports rash to her rt arm and face that began last Friday.  Pt reports being outside pulling weeds on Friday and noticed the rash after she came in side.  Pt reports itching to the area.

## 2012-05-29 NOTE — ED Notes (Signed)
Pt has an itching rash to face and rt arm  Onset after pulling weeks.  Has swelling of rt periorbital area.

## 2012-06-02 NOTE — ED Provider Notes (Signed)
History     CSN: [id number]  Arrival date & time 05/29/12  1112   First MD Initiated Contact with Patient 05/29/12 1129      Chief Complaint  Patient presents with  . Rash    (Consider location/radiation/quality/duration/timing/severity/associated sxs/prior treatment) HPI Comments: Patricia Bass is a 52 y.o. female who presents to the Emergency Department complaining of rash to the face, bilateral arms and neck.  States the rash began after being outside pullling weeds.  Thinks she may has been exposed the poison oak.  She also c/o itching and mild swelling to her face.  She denies fever, swelling of her lips, tongue or airway.  Nothing has made the rash better, states that heat makes it itch worse.    Patient is a 52 y.o. female presenting with rash. The history is provided by the patient.  Rash Associated symptoms: no chills, no fever, no nausea, no shortness of breath, no sore throat and no vomiting     Past Medical History  Diagnosis Date  . Shortness of breath   . Sleep apnea   . GERD (gastroesophageal reflux disease)   . Restless leg syndrome   . Pneumonia   . Headache(784.0)   . Bulging disc   . COPD (chronic obstructive pulmonary disease)   . Coronary artery disease     Past Surgical History  Procedure Laterality Date  . Cholecystectomy    . Oophorectomy    . Tubal ligation    . Bladder suspension    . Cardiac catheterization    . Ulnar nerve transposition  04/01/2011    Procedure: ULNAR NERVE DECOMPRESSION/TRANSPOSITION;  Surgeon: Dominica Severin, MD;  Location[facility] OR;  Service: Orthopedics;  Laterality: Left;  ULNAR NERVE RELEASE WITH ANTERIOR TRANS POSITION REPAIR RECONSTRUCTION AS NECESSARY    No family history on file.  History  Substance Use Topics  . Smoking status: Current Every Day Smoker -- 1.00 packs/day for 20 years    Types: Cigarettes  . Smokeless tobacco: Not on file  . Alcohol Use: No    OB History   Grav Para Term Preterm Abortions TAB  SAB Ect Mult Living                  Review of Systems  Constitutional: Negative for fever, chills, activity change and appetite change.  HENT: Negative for sore throat, facial swelling, trouble swallowing, neck pain, neck stiffness and voice change.   Respiratory: Negative for chest tightness, shortness of breath and wheezing.   Gastrointestinal: Negative for nausea, vomiting and abdominal pain.  Skin: Positive for color change and rash. Negative for wound.  Neurological: Negative for dizziness, weakness, numbness and headaches.  All other systems reviewed and are negative.    Allergies  Duragesic disc transdermal system  Home Medications   Current Outpatient Rx  Name  Route  Sig  Dispense  Refill  . albuterol (PROVENTIL HFA;VENTOLIN HFA) 108 (90 BASE) MCG/ACT inhaler   Inhalation   Inhale 2 puffs into the lungs every 6 (six) hours as needed. Shortness of breath         . clonazePAM (KLONOPIN) 1 MG tablet   Oral   Take 2 mg by mouth at bedtime.         [name] dexamethasone (DECADRON) 4 MG tablet      2 tablets day one, then 1 tablet daily until all taken.   8 tablet   0   . doxepin (SINEQUAN) 25 MG capsule   Oral  Take 50 mg by mouth at bedtime. Takes 25-50 mg depending on leg pain         . HYDROcodone-acetaminophen (NORCO/VICODIN) 5-325 MG per tablet   Oral   Take 1 tablet by mouth 3 (three) times daily as needed. pain         . ibuprofen (ADVIL,MOTRIN) 200 MG tablet   Oral   Take 400 mg by mouth every 6 (six) hours as needed. Pain         . levothyroxine (SYNTHROID, LEVOTHROID) 125 MCG tablet   Oral   Take 125 mcg by mouth daily.         . naphazoline-glycerin (CLEAR EYES) 0.012-0.2 % SOLN   Both Eyes   Place 1-2 drops into both eyes every 4 (four) hours as needed. Dry Eyes         . omeprazole (PRILOSEC) 20 MG capsule   Oral   Take 20 mg by mouth daily as needed. Acid reflux         . oseltamivir (TAMIFLU) 75 MG capsule   Oral   Take 1  capsule (75 mg total) by mouth 2 (two) times daily. For 5 days   10 capsule   0   . potassium chloride (K-DUR) 10 MEQ tablet   Oral   Take 2 tablets (20 mEq total) by mouth 2 (two) times daily.   30 tablet   0   . predniSONE (DELTASONE) 10 MG tablet      Take 6 tablets day one, 5 tablets day two, 4 tablets day three, 3 tablets day four, 2 tablets day five, then 1 tablet day six   21 tablet   0   . pregabalin (LYRICA) 300 MG capsule   Oral   Take 300 mg by mouth 2 (two) times daily.           BP 121/81  Pulse 80  Temp(Src) 97.2 F (36.2 C) (Oral)  Resp 18  Ht 5' (1.524 m)  Wt 170 lb (77.111 kg)  BMI 33.2 kg/m2  SpO2 95%  Physical Exam  Nursing note and vitals reviewed. Constitutional: She is oriented to person, place, and time. She appears well-developed and well-nourished. No distress.  HENT:  Head: Normocephalic and atraumatic.  Mouth/Throat: Oropharynx is clear and moist.  Eyes: Conjunctivae and EOM are normal. Pupils are equal, round, and reactive to light.  Neck: Normal range of motion. Neck supple.  Cardiovascular: Normal rate, regular rhythm, normal heart sounds and intact distal pulses.   No murmur heard. Pulmonary/Chest: Effort normal and breath sounds normal. No respiratory distress.  Musculoskeletal: She exhibits no edema and no tenderness.  Lymphadenopathy:    She has no cervical adenopathy.  Neurological: She is alert and oriented to person, place, and time. She exhibits normal muscle tone. Coordination normal.  Skin: Skin is warm. Rash noted. There is erythema.  Erythematous papules and vesicles to the face, bilateral UE's, and neck.  Mild weeping present.  Mild edema of the right cheek and periorbital region.  No lymphangitis    ED Course  Procedures (including critical care time)  Labs Reviewed - No data to display No results found.   1. Plant allergic contact dermatitis       MDM     Rash appears c/w plant dermatitis.  No airway  compromise. No involvement of the mouth or eye.   Will treat with steroids and benadryl .  She appears stable for discharge and agrees to f/u with her PMD or  return here if sx's worsen.       Aaron Bostwick L. Trisha Mangle, PA-C 06/02/12 1938

## 2012-06-03 NOTE — ED Provider Notes (Signed)
Medical screening examination/treatment/procedure(s) were performed by non-physician practitioner and as supervising physician I was immediately available for consultation/collaboration.   Hulen Mandler L Zed Wanninger, MD 06/03/12 0711 

## 2012-06-09 ENCOUNTER — Other Ambulatory Visit: Payer: Self-pay | Admitting: Obstetrics and Gynecology

## 2012-06-09 DIAGNOSIS — Z139 Encounter for screening, unspecified: Secondary | ICD-10-CM

## 2012-06-13 ENCOUNTER — Ambulatory Visit (HOSPITAL_COMMUNITY): Payer: Medicaid Other

## 2012-06-14 ENCOUNTER — Ambulatory Visit (HOSPITAL_COMMUNITY)
Admission: RE | Admit: 2012-06-14 | Discharge: 2012-06-14 | Disposition: A | Discharge status: Home / Self Care | Payer: Medicare Other | Source: Ambulatory Visit | Attending: Obstetrics and Gynecology | Admitting: Obstetrics and Gynecology

## 2012-06-14 DIAGNOSIS — Z1231 Encounter for screening mammogram for malignant neoplasm of breast: Secondary | ICD-10-CM | POA: Insufficient documentation

## 2012-06-14 DIAGNOSIS — Z139 Encounter for screening, unspecified: Secondary | ICD-10-CM

## 2012-07-31 ENCOUNTER — Other Ambulatory Visit: Payer: Self-pay | Admitting: Obstetrics and Gynecology

## 2012-07-31 ENCOUNTER — Encounter: Payer: Self-pay | Admitting: *Deleted

## 2012-08-01 DIAGNOSIS — J449 Chronic obstructive pulmonary disease, unspecified: Secondary | ICD-10-CM | POA: Diagnosis not present

## 2012-08-01 DIAGNOSIS — IMO0001 Reserved for inherently not codable concepts without codable children: Secondary | ICD-10-CM | POA: Diagnosis not present

## 2012-08-01 DIAGNOSIS — J309 Allergic rhinitis, unspecified: Secondary | ICD-10-CM | POA: Diagnosis not present

## 2012-08-01 DIAGNOSIS — M545 Low back pain: Secondary | ICD-10-CM | POA: Diagnosis not present

## 2012-09-18 ENCOUNTER — Ambulatory Visit (INDEPENDENT_AMBULATORY_CARE_PROVIDER_SITE_OTHER): Payer: Medicare Other | Admitting: Obstetrics & Gynecology

## 2012-09-18 ENCOUNTER — Other Ambulatory Visit (HOSPITAL_COMMUNITY)
Admission: RE | Admit: 2012-09-18 | Discharge: 2012-09-18 | Disposition: A | Discharge status: Home / Self Care | Payer: Medicare Other | Source: Ambulatory Visit | Attending: Obstetrics and Gynecology | Admitting: Obstetrics and Gynecology

## 2012-09-18 ENCOUNTER — Encounter: Payer: Self-pay | Admitting: Obstetrics & Gynecology

## 2012-09-18 VITALS — BP 110/80 | Temp 98.5°F | Ht 62.0 in | Wt 184.0 lb

## 2012-09-18 DIAGNOSIS — Z124 Encounter for screening for malignant neoplasm of cervix: Secondary | ICD-10-CM | POA: Diagnosis not present

## 2012-09-18 DIAGNOSIS — Z01419 Encounter for gynecological examination (general) (routine) without abnormal findings: Secondary | ICD-10-CM

## 2012-09-18 DIAGNOSIS — N390 Urinary tract infection, site not specified: Secondary | ICD-10-CM

## 2012-09-18 DIAGNOSIS — Z1212 Encounter for screening for malignant neoplasm of rectum: Secondary | ICD-10-CM | POA: Diagnosis not present

## 2012-09-18 LAB — POCT URINALYSIS DIPSTICK: Protein, UA: NEGATIVE

## 2012-09-18 MED ORDER — CIPROFLOXACIN HCL 500 MG PO TABS: 500.0000 mg | ORAL_TABLET | Freq: Two times a day (BID) | ORAL | Status: DC

## 2012-09-18 NOTE — Addendum Note (Signed)
Addended by: Criss Alvine on: 09/18/2012 04:07 PM   Modules accepted: Orders

## 2012-09-18 NOTE — Progress Notes (Signed)
Patient ID: Patricia Bass, female   DOB: 12/11/1960, 52 y.o.   MRN: [id number] Subjective:     Patricia Bass is a 52 y.o. female here for a routine exam.  No LMP recorded. Patient has had an ablation. No obstetric history on file. Current complaints: UTI symptoms.  Personal health questionnaire reviewed: no.   Gynecologic History No LMP recorded. Patient has had an ablation. Contraception: post menopausal status Last Pap: 2013. Results were: normal Last mammogram: 2013. Results were: normal  Obstetric History OB History  No data available     The following portions of the patient's history were reviewed and updated as appropriate: allergies, current medications, past family history, past medical history, past social history, past surgical history and problem list.  Review of Systems  Review of Systems  Constitutional: Negative for fever, chills, weight loss, malaise/fatigue and diaphoresis.  HENT: Negative for hearing loss, ear pain, nosebleeds, congestion, sore throat, neck pain, tinnitus and ear discharge.   Eyes: Negative for blurred vision, double vision, photophobia, pain, discharge and redness.  Respiratory: Negative for cough, hemoptysis, sputum production, shortness of breath, wheezing and stridor.   Cardiovascular: Negative for chest pain, palpitations, orthopnea, claudication, leg swelling and PND.  Gastrointestinal: negative for abdominal pain. Negative for heartburn, nausea, vomiting, diarrhea, constipation, blood in stool and melena.  Genitourinary: Negative for dysuria, urgency, frequency, hematuria and flank pain.  Musculoskeletal: Negative for myalgias, back pain, joint pain and falls.  Skin: Negative for itching and rash.  Neurological: Negative for dizziness, tingling, tremors, sensory change, speech change, focal weakness, seizures, loss of consciousness, weakness and headaches.  Endo/Heme/Allergies: Negative for environmental allergies and polydipsia. Does not  bruise/bleed easily.  Psychiatric/Behavioral: Negative for depression, suicidal ideas, hallucinations, memory loss and substance abuse. The patient is not nervous/anxious and does not have insomnia.        Objective:    Physical Exam  Vitals reviewed. Constitutional: She is oriented to person, place, and time. She appears well-developed and well-nourished.  HENT:  Head: Normocephalic and atraumatic.        Right Ear: External ear normal.  Left Ear: External ear normal.  Nose: Nose normal.  Mouth/Throat: Oropharynx is clear and moist.  Eyes: Conjunctivae and EOM are normal. Pupils are equal, round, and reactive to light. Right eye exhibits no discharge. Left eye exhibits no discharge. No scleral icterus.  Neck: Normal range of motion. Neck supple. No tracheal deviation present. No thyromegaly present.  Cardiovascular: Normal rate, regular rhythm, normal heart sounds and intact distal pulses.  Exam reveals no gallop and no friction rub.   No murmur heard. Respiratory: Effort normal and breath sounds normal. No respiratory distress. She has no wheezes. She has no rales. She exhibits no tenderness.  GI: Soft. Bowel sounds are normal. She exhibits no distension and no mass. There is no tenderness. There is no rebound and no guarding.  Genitourinary:       Vulva is normal without lesions Vagina is pink moist without discharge Cervix normal in appearance and pap is done Uterus is normal size shape and contour Adnexa is negative with normal sized ovaries   Musculoskeletal: Normal range of motion. She exhibits no edema and no tenderness.  Neurological: She is alert and oriented to person, place, and time. She has normal reflexes. She displays normal reflexes. No cranial nerve deficit. She exhibits normal muscle tone. Coordination normal.  Skin: Skin is warm and dry. No rash noted. No erythema. No pallor.  Psychiatric: She has a  normal mood and affect. Her behavior is normal. Judgment and  thought content normal.       Assessment:    Healthy female exam.    Plan:    Follow up in: 1 year.

## 2012-09-20 LAB — URINE CULTURE

## 2012-10-13 DIAGNOSIS — Z23 Encounter for immunization: Secondary | ICD-10-CM | POA: Diagnosis not present

## 2012-11-01 DIAGNOSIS — IMO0001 Reserved for inherently not codable concepts without codable children: Secondary | ICD-10-CM | POA: Diagnosis not present

## 2012-11-01 DIAGNOSIS — J449 Chronic obstructive pulmonary disease, unspecified: Secondary | ICD-10-CM | POA: Diagnosis not present

## 2012-11-01 DIAGNOSIS — E109 Type 1 diabetes mellitus without complications: Secondary | ICD-10-CM | POA: Diagnosis not present

## 2012-11-01 DIAGNOSIS — F411 Generalized anxiety disorder: Secondary | ICD-10-CM | POA: Diagnosis not present

## 2012-11-01 DIAGNOSIS — Z79899 Other long term (current) drug therapy: Secondary | ICD-10-CM | POA: Diagnosis not present

## 2012-11-01 DIAGNOSIS — Z5181 Encounter for therapeutic drug level monitoring: Secondary | ICD-10-CM | POA: Diagnosis not present

## 2012-11-01 DIAGNOSIS — I10 Essential (primary) hypertension: Secondary | ICD-10-CM | POA: Diagnosis not present

## 2012-11-16 ENCOUNTER — Telehealth (HOSPITAL_COMMUNITY): Payer: Self-pay | Admitting: Dietician

## 2012-11-16 NOTE — Telephone Encounter (Signed)
Sent letter to pt home via [employer] Mail in attempt to contact pt to schedule appointment.  

## 2012-11-16 NOTE — Telephone Encounter (Signed)
Received referral from [name] for dx: diabetes.

## 2012-11-20 NOTE — Telephone Encounter (Signed)
Received message left on 1325. Called back at 1406. Appointment scheduled for 11/24/12 at 1000.

## 2012-11-24 ENCOUNTER — Encounter (HOSPITAL_COMMUNITY): Payer: Self-pay | Admitting: Dietician

## 2012-11-24 DIAGNOSIS — F419 Anxiety disorder, unspecified: Secondary | ICD-10-CM | POA: Insufficient documentation

## 2012-11-24 DIAGNOSIS — G43909 Migraine, unspecified, not intractable, without status migrainosus: Secondary | ICD-10-CM | POA: Insufficient documentation

## 2012-11-24 DIAGNOSIS — M545 Low back pain, unspecified: Secondary | ICD-10-CM | POA: Insufficient documentation

## 2012-11-24 DIAGNOSIS — J441 Chronic obstructive pulmonary disease with (acute) exacerbation: Secondary | ICD-10-CM | POA: Insufficient documentation

## 2012-11-24 DIAGNOSIS — J449 Chronic obstructive pulmonary disease, unspecified: Secondary | ICD-10-CM | POA: Insufficient documentation

## 2012-11-24 DIAGNOSIS — G5600 Carpal tunnel syndrome, unspecified upper limb: Secondary | ICD-10-CM | POA: Insufficient documentation

## 2012-11-24 DIAGNOSIS — M542 Cervicalgia: Secondary | ICD-10-CM | POA: Insufficient documentation

## 2012-11-24 DIAGNOSIS — G629 Polyneuropathy, unspecified: Secondary | ICD-10-CM | POA: Insufficient documentation

## 2012-11-24 DIAGNOSIS — IMO0001 Reserved for inherently not codable concepts without codable children: Secondary | ICD-10-CM | POA: Insufficient documentation

## 2012-11-24 DIAGNOSIS — E039 Hypothyroidism, unspecified: Secondary | ICD-10-CM | POA: Insufficient documentation

## 2012-11-24 NOTE — Progress Notes (Signed)
Outpatient Initial Nutrition Assessment  Date:11/24/2012   Appt Start Time:  Referring Physician: [name] Reason for Visit: diabetes  Nutrition Assessment:  Height: 5\' 1"  (154.9 cm)   Weight: 187 lb (84.823 kg)   IBW: 105#  %IBW: 178% UBW: 160#  %UBW: 116% Body mass index is 35.35 kg/(m^2).  Goal Weight: 168# (10% loss of current wt) Weight hx: Wt Readings from Last 10 Encounters:  09/18/12 184 lb (83.462 kg)  05/29/12 170 lb (77.111 kg)  01/24/12 175 lb (79.379 kg)  09/08/11 180 lb (81.647 kg)  07/21/11 180 lb (81.647 kg)  03/25/11 187 lb 9.8 oz (85.1 kg)  Pt reports she weighed 160# about 1 year ago, but has progressively been gaining weight for the past few years. She reports prior to being out of work, she maintained a weight of 130-150#.  Estimated nutritional needs:  Kcals/ day: 1300-1400 Protein (grams)/day: 68-85 Fluid (L)/ day: 1.3-1.4  PMH:  Past Medical History  Diagnosis Date  . Shortness of breath   . Sleep apnea   . GERD (gastroesophageal reflux disease)   . Restless leg syndrome   . Pneumonia   . Headache(784.0)   . Bulging disc   . COPD (chronic obstructive pulmonary disease)   . Coronary artery disease     Medications:  Current Outpatient Rx  Name  Route  Sig  Dispense  Refill  . albuterol (PROVENTIL HFA;VENTOLIN HFA) 108 (90 BASE) MCG/ACT inhaler   Inhalation   Inhale 2 puffs into the lungs every 6 (six) hours as needed. Shortness of breath         . ciprofloxacin (CIPRO) 500 MG tablet   Oral   Take 1 tablet (500 mg total) by mouth 2 (two) times daily.   6 tablet   0   . clonazePAM (KLONOPIN) 1 MG tablet   Oral   Take 2 mg by mouth at bedtime.         [name] dexamethasone (DECADRON) 4 MG tablet      2 tablets day one, then 1 tablet daily until all taken.   8 tablet   0   . doxepin (SINEQUAN) 25 MG capsule   Oral   Take 50 mg by mouth at bedtime. Takes 25-50 mg depending on leg pain         . HYDROcodone-acetaminophen  (NORCO/VICODIN) 5-325 MG per tablet   Oral   Take 1 tablet by mouth 3 (three) times daily as needed. pain         . ibuprofen (ADVIL,MOTRIN) 200 MG tablet   Oral   Take 400 mg by mouth every 6 (six) hours as needed. Pain         . levothyroxine (SYNTHROID, LEVOTHROID) 125 MCG tablet   Oral   Take 125 mcg by mouth daily.         . naphazoline-glycerin (CLEAR EYES) 0.012-0.2 % SOLN   Both Eyes   Place 1-2 drops into both eyes every 4 (four) hours as needed. Dry Eyes         . omeprazole (PRILOSEC) 20 MG capsule   Oral   Take 20 mg by mouth daily as needed. Acid reflux         . oseltamivir (TAMIFLU) 75 MG capsule   Oral   Take 1 capsule (75 mg total) by mouth 2 (two) times daily. For 5 days   10 capsule   0   . potassium chloride (K-DUR) 10 MEQ tablet   Oral   Take  2 tablets (20 mEq total) by mouth 2 (two) times daily.   30 tablet   0   . predniSONE (DELTASONE) 10 MG tablet      Take 6 tablets day one, 5 tablets day two, 4 tablets day three, 3 tablets day four, 2 tablets day five, then 1 tablet day six   21 tablet   0   . pregabalin (LYRICA) 300 MG capsule   Oral   Take 300 mg by mouth 2 (two) times daily.           Labs: CMP     Component Value Date/Time   NA 135 01/24/2012 1114   K 2.7* 01/24/2012 1114   CL 95* 01/24/2012 1114   CO2 30 01/24/2012 1114   GLUCOSE 168* 01/24/2012 1114   BUN 7 01/24/2012 1114   CREATININE 0.69 01/24/2012 1114   CALCIUM 9.0 01/24/2012 1114   PROT 7.9 03/25/2011 1335   ALBUMIN 3.7 03/25/2011 1335   AST 42* 03/25/2011 1335   ALT 34 03/25/2011 1335   ALKPHOS 100 03/25/2011 1335   BILITOT 0.2* 03/25/2011 1335   GFRNONAA >90 01/24/2012 1114   GFRAA >90 01/24/2012 1114    Lipid Panel     Component Value Date/Time   CHOL  Value: 178        ATP III CLASSIFICATION:  <200     mg/dL   Desirable  409-811  mg/dL   Borderline High  >=914    mg/dL   High 7/82/9562 1308   TRIG 412* 04/17/2007 0815   HDL 26* 04/17/2007 0815   CHOLHDL 6.8  04/17/2007 0815   VLDL UNABLE TO CALCULATE IF TRIGLYCERIDE OVER 400 mg/dL 6/57/8469 6295   LDLCALC  Value: UNABLE TO CALCULATE IF TRIGLYCERIDE OVER 400 mg/dL        Total Cholesterol/HDL:CHD Risk Coronary Heart Disease Risk Table                     Men   Women  1/2 Average Risk   3.4   3.3 04/17/2007 0815     No results found for this basename: HGBA1C  01/11/12 Hgb A1c: 6.5, Estimated Average glucose: 140   Lab Results  Component Value Date   LDLCALC  Value: UNABLE TO CALCULATE IF TRIGLYCERIDE OVER 400 mg/dL        Total Cholesterol/HDL:CHD Risk Coronary Heart Disease Risk Table                     Men   Women  1/2 Average Risk   3.4   3.3 04/17/2007   CREATININE 0.69 01/24/2012     Lifestyle/ social habits: [name] resides in [place]; her brother and his girlfriend recently moved in with her. She is divorced. She is disabled. 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   She reports a high stress level due to many transitions in the past 6 months, including adopting a new dog and having her family members move in with her. She is not very physically active, due to her fibromyalgia, but has made attempts to be more active recently, including self-practicing yoga with poses that she found in magazines and walking the dog, which she did up until very recently, but stopped due to the cold weather. She reports that she would like to be more active, but her fibromyalgia and bulging disk limit her physical activity greatly.   Nutrition hx/habits: [name] reports she has  been newly diagnosed with diabetes. Hgb A1c was 6.5 in 01/2012, and she was prescribed Metformin in 10/27/12. She reveals that she checked her blood sugar in the past about 2 years ago when she had prediabetes, but was told to stop after consistently achieving levels in the 120-130's. She currently tests two times daily (2 hours after breakfast and 2 hours after dinner); she reports levels between  131-255. She reports that it has gotten as high as 299. She reports significant diet changes including decreasing sweets and salt in her diet, cooking with heart healthy oils, and switching to diet sodas. She reports difficulty following her diet as well as she likes, due to her brother's girlfriend's cooking. While pt prepares most of the meals herself, she reports that her bother's girlfriend will often prepare starchy, high salt foods. She revealed that she made a very rich cake the other day and ate a small piece, which sent her blood sugar in the high 200's.  She reports she typically eats 2 meals a day, often skipping lunch. She reports that this was a habit of her since she was working and hasn't been able to break it. She reports "I just don't don't feel hungry. She will occasionally eat an HS snack, such as fruit or graham crackers and peanut butter.  She is very determined to control her diabetes and has many diet related questions.   Diet recall: Breakfast: bowl of cereal (raisin bran OR cheerios OR special K); Lunch: skip OR tossed salad OR pimento cheese sandwich; Dinner: salmon rice, vegetable.  Nutrition Diagnosis: Nutrition-related knowledge deficit r/t newly diagnosed diabetes AEB Hgb A1c: 6.5.   Nutrition Intervention: Nutrition rx: 1400 kcal NAS, diabetic diet; 3 meals per day (4-5 hours apart); limit one starch per meal; low calorie beverages only; physical activity as tolerated  Education/Counseling Provided: Educated pt on principles of diabetic diet. Discussed carbohydrate metabolism in relation to diabetes. Educated pt on basic self-management principles including: signs and symptoms of hyperglycemia and hypoglycemia, goals for fasting and postprandial blood sugars, goals for Hgb A1c, importance of checking feet, importance of keeping PCP appointments, and foot care. Educated pt on plate method, portion sizes, and sources of carbohydrate. Discussed importance of regular meal  pattern. Discussed importance of adding sources of whole grains to diet to improve glycemic control. Also encouraged to choose low fat dairy, lean meats, and whole fruits and vegetables more often. Discussed options of artificial sweeteners and encouraged pt to use which brand she liked best. Discussed nutritional content of foods commonly eaten and discussed healthier alternatives. Discussed importance of compliance to prevent further complications of disease. Educated pt on importance of physical activity (goal of at least 30 minutes 5 times per week) along with a healthy diet to achieve weight loss and glycemic goals. Encouraged slow, moderate weight loss of 1-2# per week, or 7-10% of current body weight. Provided "Carb Counting and Meal Planning", "Your Guide To Better Office Visits", and "Diabetes and You" handouts. Used TeachBack to assess understanding.   Understanding, Motivation, Ability to Follow Recommendations: Expect fair to good compliance.   Monitoring and Evaluation: Goals: 1) 0.5-2# wt loss per week; 2) Hgb A1c < 7.0; 3) Fasting blood sugar 70-130, postprandial < 180; 4) Physical activity as tolerated  Recommendations: 1) Continue to use alarm to remind you to adhere to regular meal schedule and self monitoring; 2) Consider making separate tea sweetened with splenda; 3) Break up physical activity into smaller, more frequent sessions  F/U: PRN. Provided  RD contact information.  Therisa Mennella A. Mayford Knife, RD, LDN 11/24/2012  Appt EndTime: 1210

## 2012-12-13 DIAGNOSIS — G4730 Sleep apnea, unspecified: Secondary | ICD-10-CM | POA: Diagnosis not present

## 2012-12-13 DIAGNOSIS — M545 Low back pain: Secondary | ICD-10-CM | POA: Diagnosis not present

## 2012-12-13 DIAGNOSIS — J449 Chronic obstructive pulmonary disease, unspecified: Secondary | ICD-10-CM | POA: Diagnosis not present

## 2012-12-13 DIAGNOSIS — IMO0001 Reserved for inherently not codable concepts without codable children: Secondary | ICD-10-CM | POA: Diagnosis not present

## 2013-01-22 DIAGNOSIS — IMO0001 Reserved for inherently not codable concepts without codable children: Secondary | ICD-10-CM | POA: Diagnosis not present

## 2013-01-22 DIAGNOSIS — J4489 Other specified chronic obstructive pulmonary disease: Secondary | ICD-10-CM | POA: Diagnosis not present

## 2013-01-22 DIAGNOSIS — J449 Chronic obstructive pulmonary disease, unspecified: Secondary | ICD-10-CM | POA: Diagnosis not present

## 2013-01-22 DIAGNOSIS — M545 Low back pain, unspecified: Secondary | ICD-10-CM | POA: Diagnosis not present

## 2013-01-22 DIAGNOSIS — G4730 Sleep apnea, unspecified: Secondary | ICD-10-CM | POA: Diagnosis not present

## 2013-01-22 DIAGNOSIS — E109 Type 1 diabetes mellitus without complications: Secondary | ICD-10-CM | POA: Diagnosis not present

## 2013-01-22 DIAGNOSIS — E039 Hypothyroidism, unspecified: Secondary | ICD-10-CM | POA: Diagnosis not present

## 2013-01-24 DIAGNOSIS — E109 Type 1 diabetes mellitus without complications: Secondary | ICD-10-CM | POA: Diagnosis not present

## 2013-01-24 DIAGNOSIS — M545 Low back pain, unspecified: Secondary | ICD-10-CM | POA: Diagnosis not present

## 2013-01-24 DIAGNOSIS — IMO0001 Reserved for inherently not codable concepts without codable children: Secondary | ICD-10-CM | POA: Diagnosis not present

## 2013-01-24 DIAGNOSIS — J449 Chronic obstructive pulmonary disease, unspecified: Secondary | ICD-10-CM | POA: Diagnosis not present

## 2013-04-23 DIAGNOSIS — J449 Chronic obstructive pulmonary disease, unspecified: Secondary | ICD-10-CM | POA: Diagnosis not present

## 2013-04-23 DIAGNOSIS — M545 Low back pain, unspecified: Secondary | ICD-10-CM | POA: Diagnosis not present

## 2013-04-23 DIAGNOSIS — Z79899 Other long term (current) drug therapy: Secondary | ICD-10-CM | POA: Diagnosis not present

## 2013-04-23 DIAGNOSIS — Z5181 Encounter for therapeutic drug level monitoring: Secondary | ICD-10-CM | POA: Diagnosis not present

## 2013-04-23 DIAGNOSIS — F411 Generalized anxiety disorder: Secondary | ICD-10-CM | POA: Diagnosis not present

## 2013-04-25 DIAGNOSIS — M545 Low back pain, unspecified: Secondary | ICD-10-CM | POA: Diagnosis not present

## 2013-04-25 DIAGNOSIS — J449 Chronic obstructive pulmonary disease, unspecified: Secondary | ICD-10-CM | POA: Diagnosis not present

## 2013-04-25 DIAGNOSIS — E109 Type 1 diabetes mellitus without complications: Secondary | ICD-10-CM | POA: Diagnosis not present

## 2013-04-25 DIAGNOSIS — F411 Generalized anxiety disorder: Secondary | ICD-10-CM | POA: Diagnosis not present

## 2013-05-25 DIAGNOSIS — E109 Type 1 diabetes mellitus without complications: Secondary | ICD-10-CM | POA: Diagnosis not present

## 2013-08-15 ENCOUNTER — Other Ambulatory Visit: Payer: Self-pay | Admitting: Obstetrics and Gynecology

## 2013-08-15 DIAGNOSIS — Z1231 Encounter for screening mammogram for malignant neoplasm of breast: Secondary | ICD-10-CM

## 2013-08-20 ENCOUNTER — Ambulatory Visit (HOSPITAL_COMMUNITY): Payer: Medicare Other

## 2013-09-03 DIAGNOSIS — E039 Hypothyroidism, unspecified: Secondary | ICD-10-CM | POA: Diagnosis not present

## 2013-09-03 DIAGNOSIS — E109 Type 1 diabetes mellitus without complications: Secondary | ICD-10-CM | POA: Diagnosis not present

## 2013-09-03 DIAGNOSIS — IMO0001 Reserved for inherently not codable concepts without codable children: Secondary | ICD-10-CM | POA: Diagnosis not present

## 2013-09-03 DIAGNOSIS — J449 Chronic obstructive pulmonary disease, unspecified: Secondary | ICD-10-CM | POA: Diagnosis not present

## 2014-01-16 ENCOUNTER — Other Ambulatory Visit: Payer: Self-pay | Admitting: Obstetrics and Gynecology

## 2014-01-16 DIAGNOSIS — Z1231 Encounter for screening mammogram for malignant neoplasm of breast: Secondary | ICD-10-CM

## 2014-01-21 ENCOUNTER — Ambulatory Visit (HOSPITAL_COMMUNITY): Payer: Medicare Other

## 2014-02-01 DIAGNOSIS — E039 Hypothyroidism, unspecified: Secondary | ICD-10-CM | POA: Diagnosis not present

## 2014-02-01 DIAGNOSIS — E119 Type 2 diabetes mellitus without complications: Secondary | ICD-10-CM | POA: Diagnosis not present

## 2014-02-01 DIAGNOSIS — J449 Chronic obstructive pulmonary disease, unspecified: Secondary | ICD-10-CM | POA: Diagnosis not present

## 2014-02-01 DIAGNOSIS — M797 Fibromyalgia: Secondary | ICD-10-CM | POA: Diagnosis not present

## 2014-03-25 ENCOUNTER — Other Ambulatory Visit: Payer: Self-pay

## 2014-03-25 NOTE — Patient Outreach (Signed)
[facility] Triage attempted call #1.  Per voice mail automation: person is not available at this time.   RN CM scheduled for outreach #2 within one week.   Donato Schultzrystal Alivea Gladson, RN, BSN, [facility], CCM  [facility] Care Management Care Management Coordinator [phone] Office [phone] Direct [phone] Cell

## 2014-03-26 ENCOUNTER — Other Ambulatory Visit: Payer: Self-pay

## 2014-03-26 NOTE — Patient Instructions (Signed)
Diabetes Mellitus and Food It is important for you to manage your blood sugar (glucose) level. Your blood glucose level can be greatly affected by what you eat. Eating healthier foods in the appropriate amounts throughout the day at about the same time each day will help you control your blood glucose level. It can also help slow or prevent worsening of your diabetes mellitus. Healthy eating may even help you improve the level of your blood pressure and reach or maintain a healthy weight.  HOW CAN FOOD AFFECT ME? Carbohydrates Carbohydrates affect your blood glucose level more than any other type of food. Your dietitian will help you determine how many carbohydrates to eat at each meal and teach you how to count carbohydrates. Counting carbohydrates is important to keep your blood glucose at a healthy level, especially if you are using insulin or taking certain medicines for diabetes mellitus. Alcohol Alcohol can cause sudden decreases in blood glucose (hypoglycemia), especially if you use insulin or take certain medicines for diabetes mellitus. Hypoglycemia can be a life-threatening condition. Symptoms of hypoglycemia (sleepiness, dizziness, and disorientation) are similar to symptoms of having too much alcohol.  If your health care provider has given you approval to drink alcohol, do so in moderation and use the following guidelines:  Women should not have more than one drink per day, and men should not have more than two drinks per day. One drink is equal to:  12 oz of beer.  5 oz of wine.  1 oz of hard liquor.  Do not drink on an empty stomach.  Keep yourself hydrated. Have water, diet soda, or unsweetened iced tea.  Regular soda, juice, and other mixers might contain a lot of carbohydrates and should be counted. WHAT FOODS ARE NOT RECOMMENDED? As you make food choices, it is important to remember that all foods are not the same. Some foods have fewer nutrients per serving than other  foods, even though they might have the same number of calories or carbohydrates. It is difficult to get your body what it needs when you eat foods with fewer nutrients. Examples of foods that you should avoid that are high in calories and carbohydrates but low in nutrients include:  Trans fats (most processed foods list trans fats on the Nutrition Facts label).  Regular soda.  Juice.  Candy.  Sweets, such as cake, pie, doughnuts, and cookies.  Fried foods. WHAT FOODS CAN I EAT? Have nutrient-rich foods, which will nourish your body and keep you healthy. The food you should eat also will depend on several factors, including:  The calories you need.  The medicines you take.  Your weight.  Your blood glucose level.  Your blood pressure level.  Your cholesterol level. You also should eat a variety of foods, including:  Protein, such as meat, poultry, fish, tofu, nuts, and seeds (lean animal proteins are best).  Fruits.  Vegetables.  Dairy products, such as milk, cheese, and yogurt (low fat is best).  Breads, grains, pasta, cereal, rice, and beans.  Fats such as olive oil, trans fat-free margarine, canola oil, avocado, and olives. DOES EVERYONE WITH DIABETES MELLITUS HAVE THE SAME MEAL PLAN? Because every person with diabetes mellitus is different, there is not one meal plan that works for everyone. It is very important that you meet with a dietitian who will help you create a meal plan that is just right for you. Document Released: 09/17/2004 Document Revised: 12/26/2012 Document Reviewed: 11/17/2012 ExitCare Patient Information 2015 [employer]. This   information is not intended to replace advice given to you by your health care provider. Make sure you discuss any questions you have with your health care provider.  

## 2014-03-26 NOTE — Patient Outreach (Signed)
   03/26/2014   [name] Jul 29, 1960 [id number]   [facility] Coord[facility] Telephonic Screening outreach Attempt #2.  Home [phone] (Disconnected #) Phone Search:  RN CM contacted mother, Patricia Bass [phone] who provided new contact numbers [phone] cell [phone] home   Additional same day attempt to  [phone] home (Busy) Additional same day attempt to [phone] cell.  Patient reached and verified the following:    [phone] home [phone] cell  [phone] Obama phone -  250 minutes per month.   Address Verification:  [address]  [place], [place] [address]  Initial Assessment completed.    Patient with limited mobility associated to chronic pain in lower and upper back.  States h/o past physical therapy caused her to have  increased pain.  Chronic pain management medication regime in place.  Patient has submitted application to primary MD:  Dr. Juanetta [facility]lingHawkins [facility](PCS) worker about two weeks ago.  Patient states her roommate has a Proofreader(PCS) worker [facility] and patient would like to use the same worker and agency if possible.  Patient agrees to [facility] services and next [facility] Care [facility] Coordinator telephonic appointment call within one month.   [facility] [address] [place], [place] [address] [phone]     Donato Schultzrystal Patria Warzecha, RN, BSN, [facility], CCM  [facility] Care Management Care Management Coordinator [phone] Office [phone] Direct [phone] Cell

## 2014-04-25 DIAGNOSIS — J449 Chronic obstructive pulmonary disease, unspecified: Secondary | ICD-10-CM | POA: Diagnosis not present

## 2014-04-25 DIAGNOSIS — G4730 Sleep apnea, unspecified: Secondary | ICD-10-CM | POA: Diagnosis not present

## 2014-04-25 DIAGNOSIS — M797 Fibromyalgia: Secondary | ICD-10-CM | POA: Diagnosis not present

## 2014-04-25 DIAGNOSIS — E119 Type 2 diabetes mellitus without complications: Secondary | ICD-10-CM | POA: Diagnosis not present

## 2014-04-29 ENCOUNTER — Other Ambulatory Visit: Payer: Self-pay

## 2014-04-29 NOTE — Patient Outreach (Signed)
[facility] Customer service manager [name]) Care Management  04/29/2014  Patricia Bass June 13, 1960 [id number]   Monthly Disease Management and Care Coordination Contact Note:  Referral Source:  [employer] Delegation  Referral Date:  03/19/2014 Triage & Initial Assessment: 03/26/14 Primary MD:  Dr. [name]  Patient Phone/Address Verification:  [address] [place], [place] [address] [phone] home [phone] cell  [phone] Obama phone - 250 minutes per month.   Social:  Patient is divorced and disabled;  lives in her home with a roommate.  Patient has difficulty with walking, cleaning house and cooking.  States difficult to stand for extended periods of time.  Patient has MCD and interested in getting a Medicaid PCS worker.  PCS application initiated by [name] on 04/25/14.   Diabetes:  Sees Primary MD, [name] every 3-4 months.  Completed last follow-up appt 04/25/2014 Blood sugar target goal discussed on last MD appt:  Below 175.  Patient has set a personal goal of below 130 and has managed to stay below 130 over the past one month.  A1C:  New lab taken 04/25/14 - result pending. States MD will mail her a copy of her report once results are back.  BP:  138/72 - states BP decreased since changing from regular table salt to Kosher salt and [name]. Wt:  179 on 04/25/14 Cholesterol Management:  Patient states MD ordered new medication Pravastatin  every day 04/25/2014.  ASA:  None ordered.  Exercise:  Patient has purchased a video Tape exercise program that has some easy beginner steps over the last one month.  Diet:  Patient has made progress towards food selections for Diabetic diet management.  RN CM encouraged to continue to read food labels and tips provided on food selections to avoid high glycemic index foods. RN CM encouraged to keep a food diary as a way to identify trigger foods which may increase blood sugar readings.  LTG:  Patient would like to avoid injectables to  manage her diabetes STG:  Patient will reduce maintain current weight of 170 or reduce weight by 2-3 lbs within one month.  Patient will review Diabetes educational materials and review food selections on next RN CM call within one month.        Foot Care:   Patient admits to non-compliance with assessing feet.   Dr. Pricilla Holm over one year ago.  Bilateral great toenail removal.  States has formed a callous in these area on side of toe.  RN CM instructed to contact Dr. Pricilla Holm for appt and importance of foot care in regards to Diabetes management Patient verbalized understanding and stated she would contact Dr. Pricilla Holm to scheduled appt  Smoking: Patient continues to smoke and admits that when her blood sugar goes up; she feels a need to smoke to relieve her stress.  Patient has started using Vapors as an intervention to smoking cessation plan.  RN CM encouraged patient to continue with her smoking cessation efforts. RN CM reviewed health benefits of smoking cessation.  STG:  Smoking cessation progress   Pain / Arthritis C/o increase in arthritis symptoms in knees, ankles, hips and shoulders.  Patient reported to MD on 04/25/14 appt and MD ordered new medication:  Nabumetone  qd.  Patient has initiated.  States bottle instructs to take 2 tabs every morning but she is taking 1 tablet twice a day.    COPD Nebulizer:  None.  States MD mentioned ordering a nebulizer once but she is not sure why it was never  followed up on.  States she thinks the Rx went to [employer] insurance [employer] but does not really know. States a friend gave her a nebulizer with a new mask and tubing and Albuterol Sulfate 3mg /0.5mg .  States she has not used and feels this would be helpful for her symptom management.  RN CM instructed not to use until patient has confirmed MD has approved.   RN CM advised MD will want to review and Rx specific medication and dosage that patient should have.  Patient states she would contact her MD  today to discuss.   RN CM requested call back from patient with update on MD contact and recommendations.   Advised RN CM can assist with any coordination needs as needed.   Upper respiratory symptoms and productive cough (clear sputum) over th past week.  States she has drainage from her ears when she lays down on her sides.  Confirms she reported her symptoms to Dr. Juanetta GoslingHawkins on 04/25/2014 and MD had no new recommendations at that time based on symptoms.  RN CM advised that ears should not be draining and this is an abnormal finding.  Please notify MD of this symptom.  Patient verbalized understanding and states she will follow-up with MD.   Advance Directive: Patient received in introductory package but has not reviewed.  RN CM encouraged patient to review and may contact RN CM for questions / concerns.   RN CM will review again next contact call in one month.    [facility] Consent for services: Patient confirms she received [facility] introductory package but did not sigh and return form.   RN CM had patient get the form out of package while on this call and reviewed again.  Patient verbalized understanding and stated she would sign and return to [facility].  Patient agrees to services and agreed to next call in one month.   Donato Schultzrystal [name], RN, BSN, [facility]HL, CCM  [facility] Care Management Care Management Coordinator [phone] Office [phone] Direct [phone] Cell

## 2014-05-03 NOTE — Patient Outreach (Signed)
[facility](THN) Care Management  05/03/2014  Marton RedwoodCarrie B Goins  [birth year] [id number]    RN CM received patient's signed written consent via mail.   Document placed in CMA Assistants Box for Epic entry.  In-basket note sent to [facility] Assistant.   Donato Schultzrystal Ava Tangney, RN, BSN, [facility], CCM  [facility] Care Management Care Management Coordinator [phone] Office [phone] Direct [phone] Cell

## 2014-05-27 ENCOUNTER — Other Ambulatory Visit: Payer: Medicare Other

## 2014-05-27 NOTE — Patient Outreach (Signed)
[facility]) Care Management  05/27/2014  Patricia Bass September 27, 1960 [id number]   Care Coordination Note:   Referral Date: 03/19/2014 Referral Source: [facility] Delegation  Triage & Initial Assessment: 03/26/14 Telephonic Case Management Services: 03/26/14  Admissions: 0 ED visits: 0 Primary MD: Dr. Sinda Du - last appt 04/25/2014 Podiatrist:  Dr. Berline Lopes  Patient Phone/Address Verification:  [place],  [address] [phone] home [phone] cell  [phone] Obama phone - 250 minutes per month.   Social: Patient is divorced and disabled; lives in her home with a roommate. Patient has limited mobility and difficulty with walking, cleaning and cooking associated to chronic pain in lower and upper back. H/o difficulty standing for extended periods of time. H/o past physical therapy caused her to have increased pain. Chronic pain management medication regime in place. Patient has submitted application to primary MD: Dr. Luan Pulling for [facility]) worker about 03/2014.   Patient states her roommate has a Actor) worker from [facility] and patient would like to use the same worker and agency if possible.  PCS application initiated by Dr. Luan Pulling on 04/25/14.  Patient does not know the name of the agency that Primary MD sent PCS referral to.  States she did receive a call from someone stating someone would be contacting her to complete a home assessement but no one has called yet.  RN CM will contact Primary MD to get name of provider PCS request was sent to and follow-up on progress of plan. Transportation: owns a car and able to drive.   DME:  Nebulizer (from a friend), glucometer, glasses.    Diabetes:  Sees Primary MD, Dr. Luan Pulling every 3-4 months. Last appt 04/25/2014 Blood sugar target goal:  Below 175. Patient has set a personal goal of below 130 and has managed to stay below 130 again this month.  A1C:  States A1C increased from 5.4 to 6.+ but she is not sure what happened to cause the increase.  BP: 138/72 - using Kosher salt and [name]. Wt: 179 on 04/25/14 and 170 this call.   Cholesterol Management: Pravastatin 98m every day initiated on 04/25/2014.  ASA: None ordered.  Exercise: Patient has purchased a video tape exercise program that has some easy beginner steps about a month ago.  Diet: Patient has made progress towards food selections for Diabetic diet management.  Patietn is reading food labels and improving her knowledge on the importance of carbohydrate management   RN CM encouraged to continue to read food labels and tips provided on food selections to avoid high glycemic index foods. RN CM encouraged to keep a food diary as a way to identify trigger foods which may increase blood sugar readings. Goal continued:  Patient has not initiated.  RN CM advised to go to the [employer] Pontand purchase a cAvnetfor $1.00.  LTG: Patient would like to avoid injectables to manage her diabetes over the next 90 days.  STG: Patient will reduce maintain current weight of 170 or reduce weight by 2-3 lbs over the next 30 days.   STGL  Patient will review Diabetes educational materials within the next 30 days.    Foot Care:  Patient admits to non-compliance with assessing feet.  Last appt with Dr. TBerline Lopesover one year ago.  Issue:  Bilateral great toenail removal. States she has formed a callous in these area on side of toe.  RN CM instructed to contact Dr. TBerline Lopesfor appt and  importance of foot care in regards to Diabetes management (Goal not met).  Patient verbalized understanding and stated she would contact Dr. Berline Lopes to scheduled appt following our call today.   Cardiac: H/o heart cath about 3-5 years ago and self reports a 20% and 30% blockage.  Recommendations:  Cholesterol mgmt.   COPD Nebulizer: None. H/o  MD mentioned ordering a nebulizer once but  did not do so. States she thinks the Rx went to [employer] but does not really know. States a friend gave her a nebulizer with a new mask and tubing and Albuterol Sulfate 65m/0.5mg. States she has not used and feels this would be helpful for her symptom management if she needed.  Patient states she would contact her MD on 04/29/2014 to discuss but has not done so this call.  Patint reports her COPD to be stable.  RN CM instructed not to use until patient has confirmed MD has approved and MD will want to review and Rx specific medication and dosage that patient should have.   Pain / Arthritis C/o increase in arthritis symptoms in knees, ankles, hips and shoulders. Patient reported to MD on 04/25/14 appt and MD initiated Nabumetone 7561mqd.States bottle instructs to take 2 tabs every morning but she is taking 1 tablet twice a day. States medication has started to cause some GI upset and she has reported to MD.  Patient is waiting on MD to call back with recommendations. RN CM reviewed importance of compliance with prescribed omeprazole.  Medications: Patient keeps medications in a lock box due to past issues with her brother taking her meds.   Medication reconciliation completed this call.    Smoking: H/o patient continues to smoke about 7-10 per day or a pack about every 3-4 days.  H/o when patient's  blood sugar goes up; she feels a need to smoke to relieve her stress. Patient has started using Vapors 49m61ms an intervention to smoking cessation plan. Patient's roommate is a smoker.  RN CM encouraged patient to continue with her smoking cessation efforts. RN CM reviewed health benefits of smoking cessation.  STG: Patient will reduce number of Smoking cessation progress    Advance Directive: Patient received in introductory package but states unable to locate the document on 05/27/2014 contact call.  Patient requested a 2nd copy be mailed to her.  RN CM sent 2nd copy of AD RN  CM will review again next contact call in one month.   [facility]: Written consent received 04/28/2014 and on file.  Patient gives verbal consent to continue services and agreed to next contact call within 30 days and care coordination services as needed.   Plan:  RN CM will schedule for next contact call within 30 days. RN CM advised to please notify MD of any changes in her condition prior to scheduled appt's.   RN CM provided contact name and #, 24-hour nurse line # [phone].   RN CM confirmed patient is aware of 911 services for urgent emergency needs.  Care Coordination:  RN CM will contact Primary MD regarding PCS progress and name of provider.   Educational materials mailed on 05/27/14 -Emmi: Counting Carbohydrates -Emmi:  High-fiber foods -Emmi:  Benefits of quitting tobacco 2nd copy of Advanced Directive   CryMariann LasterN, BSN, MSHSaint Barnabas Medical CenterCMReadernagement Care Management Coordinator [phone] [phone] [phone]

## 2014-05-28 ENCOUNTER — Telehealth: Payer: Self-pay

## 2014-05-28 ENCOUNTER — Other Ambulatory Visit: Payer: Self-pay

## 2014-05-28 NOTE — Patient Outreach (Signed)
[facility](THN) Care Management  05/28/2014  Marton RedwoodCarrie B Goins 1960/10/23 [id number]   Care Coordination Note:  Outreach call to patient to update on PCS application status.  RN CM verified the following for PCS application needs: DOB:  1960/10/23 [place] MCD # [id number] [employer] ID # [id number] RN CM advised patient that PCS application on hold until [facility].   RN CM advised that Dr. Juanetta GoslingHawkins office has been made aware and requested to send updated application with needed DOB correction.   Donato Schultzrystal Thorn Demas, RN, BSN, [facility] AlabamaMSHL, CCM  [facility] Care Management Care Management Coordinator [phone] Office [phone] Direct [phone] Cell

## 2014-05-28 NOTE — Patient Outreach (Signed)
[facility](THN) Care Management  05/28/2014  Marton RedwoodCarrie B Goins Feb 09, 1960 [id number]  Care Coordination Note:  Outreach call to [facility] [place]  Phone# [phone]  Issue:  Status of PCS referral application. Contact states referral received from Dr. Juanetta GoslingHawkins on 04/30/14 but was faxed back to MD office contact:  Andrena MewsBeverly Robertson on 04/30/14 due to incorrect DOB.  [facility] has not received corrected form to date.   Plan: RN CM will follow with Dr. Juanetta GoslingHawkins to request attention to completion and fax to [phone]  Donato Schultzrystal Montey Ebel, RN, BSN, [facility]MS[facility], CCM  [facility] Care Management Care Management Coordinator [phone] Office [phone] Direct [phone] Cell

## 2014-05-28 NOTE — Patient Outreach (Signed)
[facility](THN) Care Management  05/28/2014  [name] [birth year] [id number]  Care Coordination Note:  Outreach call to Primary MD, Dr. Juanetta GoslingHawkins.  Issue: Status of PCS referral application. RN CM provided update that PCS provider contact states referral received from Dr. Juanetta GoslingHawkins on 04/30/14 but was faxed back to MD office contact: Andrena MewsBeverly Robertson on 04/30/14 due to incorrect DOB. [facility] has not received corrected form to date.  [facility] [place]  [phone] phone [phone] fax RN CM requested attention to completion of needed correction to application and advised to fax back to [phone] RN CM provided Dr. Juanetta GoslingHawkins office contact with updated [facility] phone # as Dr. Juanetta GoslingHawkins provided incorrect phone # in earlier call today with RN CM. RN CM confirmed the following:  DOB: [birth year] [place] MCD # [id number] [employer] ID # [id number]  Patricia Schultzrystal Francheska Villeda, RN, BSN, [facility]MS[facility], CCM  [facility] Care Management Care Management Coordinator [phone] Office [phone] Direct [phone] Cell

## 2014-05-28 NOTE — Patient Outreach (Signed)
[facility](THN) Care Management  05/28/2014  [name] [birth year] [id number]   Care Coordination Note:  Outreach call to Primary MD, Dr. Juanetta GoslingHawkins office re:  [facility] provider name referral application was sent to. Contact:  Becky Issue:  Patient is divorced and disabled; lives in her home with a roommate. Patient has limited mobility and difficulty with walking, cleaning and cooking associated to chronic pain in lower and upper back. H/o difficulty standing for extended periods of time. H/o past physical therapy caused her to have increased pain. Chronic pain management medication regime in place. Patient has submitted application to primary MD: Dr. Juan[facility]Hawkins [facility]S) worker about 03/2014.  Patient states her roommate has a Proofreader(PCS) worker from [facility] and patient would like to use the same worker and agency if possible.  PCS application initiated by Dr. Juanetta GoslingHawkins on 04/25/14. Patient does not know the name of the agency that Primary MD sent PCS referral to. States she did receive a call from someone stating someone would be contacting her to complete a home assessment but no one has called yet.  Outcome:  [facility] [phone] RN CM will contact [facility] for updates on referral.   Donato Schultzrystal Voshon Petro, RN, BSN, [facility], CCM  [facility] Care Management Care Management Coordinator [phone] Office [phone] Direct [phone] Cell

## 2014-06-21 ENCOUNTER — Ambulatory Visit: Payer: Medicare Other

## 2014-06-24 ENCOUNTER — Other Ambulatory Visit: Payer: Medicare Other

## 2014-06-24 NOTE — Patient Outreach (Signed)
[facility] Customer [facility]) Care Management  06/24/2014  Patricia Bass 08-14-1960 [id number]   Telephonic Care Coordination Note  Outreach call #1 to patient.  Patient not reached.  No answer following several rings.  RN CM scheduled for next contact call.   [name], RN, BSN, [facility], CCM  [facility] [employer] Management Coordinator [phone] Office [phone] Direct [phone] Cell

## 2014-06-25 ENCOUNTER — Other Ambulatory Visit: Payer: Self-pay

## 2014-06-25 NOTE — Patient Outreach (Signed)
[facility]) Care Management  06/25/2014  Patricia Bass Jul 12, 1960 [id number]    Care Coordination Note:   Referral Date: 03/19/2014 Referral Source: [facility] Delegation  Triage & Initial Assessment: 03/26/14 Telephonic Case Management Services: 03/26/14  [place] MCD # [id number] [employer] ID # [id number] Admissions: 0 ED visits: 0 Primary MD: Dr. Sinda Du - last appt 04/25/2014 - next appt 07/25/14 Podiatrist: Dr. Berline Lopes  Social:  Patient Phone/Address Verification:  [place], [place] [address] [phone] home [phone] cell  [phone] Obama phone - 250 minutes per month.  Patient is divorced and disabled; lives in her home with a roommate. Patient has limited mobility and difficulty with walking, cleaning and cooking associated to chronic pain in lower and upper back. H/o difficulty standing for extended periods of time. H/o past physical therapy caused her to have increased pain. Chronic pain management medication regime in place.  PCS:  Primary MD, Dr. Luan Pulling sent referral to [facility] [phone] on 04/25/14 for Medicaid Personal Care Service [facility]) worker. Patient did receive a call from someone stating someone would be contacting her to complete a home assessment but no one has called back. RN CM contacted [employer] # [phone] on 05/28/14 regarding status of [facility]) referral application.  Contact stated referral received from Dr. Luan Pulling on 04/30/14 but was faxed back to MD office contact: Oretha Milch on 04/30/14 due to incorrect DOB. [facility] has not received corrected form to date. RN CM notified Dr. Luan Pulling on 05/28/14.  Contact information as follows: [employer] [facility] [place], [phone] phone, [phone] fax. Patient provides update on 06/25/14 she has not followed up with anyone on this issue as she has been busy making sure her roommate does  not miss any of her MD appt's.  Transportation: owns a car and able to drive. Resources:  Food Stamps $146.00 per month; [employer] 3rd Wed of the month in [place], [place]. DME: Nebulizer (from a friend), glucometer, glasses.   Advance Directive: Patient received mailed Advance Directive and has completed but has not had document motorized yet.  States contact on document is Cristal Ford and room mate.  States she did not elect a family member due to fear they would not carry out her wishes.  States she does not wish to be placed on a breathing machine and would like to be cremated due to the cost is less than a traditional burial.   RN CM praised patient for progress.  Instructed to take document to a location of a notary such as bank, Art therapist, Agricultural consultant, funeral home to get witnessed and notarized.  Keep original copy in safe location.  Provide copy to MD, friend, Helene Kelp and ED/Hospital as needed.  RN CM will review again next contact call in one month. STG:  Patient will finalize and complete Advance Directive within the next 30 days.    Diabetes:  Sees Primary MD, Dr. Luan Pulling every 3-4 months. Last appt 04/25/2014 - Next appt 07/25/14 Blood sugar target goal: Below 175. Patient has set a personal goal of below 130 and has managed to stay below 130 again this month.  Morning BS 120 today. A1C: States A1C increased from 5.4 to 6.+ but she is not sure what happened to cause the increase.  BP: 138/72 - using Kosher salt and [name]. Wt: 179 on 04/25/14; 170 05/27/14 and states only weighs at MD's office.  Plans to purchase scales.  States clothes are fitting about  the same.   ASA: None ordered.  Exercise: Patient has purchased a video tape exercise program that has some easy beginner steps about a month ago.  Diet: Patient has made progress towards food selections for Diabetic diet management. Patient is reading food labels and improving her knowledge on  the importance of carbohydrate management -Emmi Education: Counting Carbohydrates  (Mailed 05/27/14 and reviewed 06/25/14) -Emmi Education: High-fiber foods  (Mailed 05/27/14 and reviewed 06/25/14)  RN CM reviewed and encouraged to continue to read food labels and tips provided on food selections to avoid high glycemic index foods. RN CM encouraged to keep a food diary as a way to identify trigger foods which may increase blood sugar readings. Goal continued: Partially met:   RN CM advised to go to the [employer] and purchase a [employer] for $1.00.   Patient has purchased her notebook but has not started her food diary.  RN CM praised patient for progress toward goal and encouraged to start witting down.  Encouraged to focus on foods high in fiber and low carbohydrates.  RN CM encouraged to review Ed Materials mailed regarding these topics to stay encouraged.  LTG: Patient would like to avoid injectables to manage her diabetes over the next 90 days.  STG: Patient will reduce maintain current weight of 170 or reduce weight by 2-3 lbs over the next 30 days. (Next weight check 07/25/14) STG: Goal met 06/25/14:  Patient will review Diabetes educational materials within the next 30 days.    Foot Care:  H/o patient admits to non-compliance with assessing feet. Last appt with Dr. Berline Lopes over one year ago.  Patient has scheduled appt to see Dr. Berline Lopes in July 2016. Issue: Bilateral great toenail removal. States she has formed a callous in these area on side of toe.  RN CM instructed to keep appt with Dr. Berline Lopes and reviewed importance of foot care in regards to Diabetes management (Goal met 06/25/2014:  Patient has scheduled appt for 07/2014) RN CM will continue to monitor compliance with keeping MD appt.   Cardiac: H/o heart cath about 3-5 years ago and self reports a 20% and 30% blockage. Recommendations: Cholesterol mgmt. Pravastatin 46m every day (initiated  04/25/2014).  COPD Nebulizer: None. H/o MD mentioned ordering a nebulizer once but did not do so. States she [place] [facility] [place]. States a friend gave her a nebulizer with a new mask and tubing and Albuterol Sulfate 357m0.5mg. States she has not used and feels this would be helpful for her symptom management if she needed. Patient reports her COPD to be stable; next MD appt 07/25/14.  RN CM instructed NOT to use until patient has confirmed MD has approved and ordered this medication for patient. Advised against using medications not prescribed by patient's MD  Pain / Arthritis C/o increase in arthritis symptoms in knees, ankles, hips and shoulders. Patient reported to MD on 04/25/14 appt and MD initiated Nabumetone 75052md.States bottle instructs to take 2 tabs every morning but she is taking 1 tablet twice a day. States medication has started to cause some GI upset and she has reported to MD during May 2016.  Patient only taking her Omeprazole as needed.  RN CM reviewed importance of compliance with prescribed omeprazole to manage GI symptoms as needed which will help with symptom management needed to take medications ordered for pain such as NSAID's.  Encouraged to report GI side effects to MD.  Medications: Patient keeps medications in a lock box due to past issues with her brother taking her meds.  Medication reconciliation completed this call.   Smoking: H/o when patient's blood sugar goes up; she feels a need to smoke to relieve her stress. Patient has started using Vapors 27m as an intervention to smoking cessation plan. (Currently using 1 fill per day).  Patient is not able to provide how many cigarettes this is equivalent to. Patient's roommate is a smoker. -Emmi Education: Benefits of quitting tobacco (Mailed 05/27/14 and reviewed 06/25/14)  RN CM encouraged patient to continue with her smoking cessation efforts. RN CM  reviewed health benefits of smoking cessation and related health complications and risk.  RN CM will continue to monitor progress with smoking cessation. STG: Patient will show progress with Smoking cessation within 30 days by cutting back on the amount of Vapor used per day.  [facility]: Written consent received 04/28/2014 and on file.  Patient gives verbal consent to continue services and agreed to next contact call within 30 days and care coordination services as needed. RN CM advised patient that goals of care are progressing.  RN CM would like to finalize Advance Directives and PCS progress over the next 30 days.   RN CM advised to please notify MD of any changes in her condition prior to scheduled appt's.  RN CM provided contact name and #, 24-hour nurse line # [phone].  RN CM confirmed patient is aware of 911 services for urgent emergency needs.  Plan:  RN CM will schedule for next contact call within 30 days. RN CM will close case within 30 days once Advance Directive goal met and no new needs identified.   RN CM will contact Primary MD regarding PCS progress and name of provider.   CMariann Laster RN, BSN, [facility] CCM  [facility] HFord [employer] CompanyManagement Coordinator [phone] [phone] [phone]

## 2014-07-23 ENCOUNTER — Other Ambulatory Visit: Payer: Commercial Managed Care - HMO

## 2014-07-23 NOTE — Patient Outreach (Signed)
[facility](THN) Care Management  07/23/2014  Patricia Bass [birth year] [id number]  Telephonic Care Management Note  Outreach call #1 to patient for monthly telephonic assessment.  Patient not reached.  [phone] Invalid # [phone] Left message [phone] left message RN CM left HIPAA compliant voice message with name and #.   Plan: RN CM will reschedule for next outreach call within 1-2 weeks.   Donato Schultzrystal Jadis Mika, RN, BSN, [facility], CCM  [facility] Care Management Care Management Coordinator [phone] Office [phone] Direct [phone] Cell

## 2014-07-25 DIAGNOSIS — E119 Type 2 diabetes mellitus without complications: Secondary | ICD-10-CM | POA: Diagnosis not present

## 2014-07-25 DIAGNOSIS — M545 Low back pain: Secondary | ICD-10-CM | POA: Diagnosis not present

## 2014-07-25 DIAGNOSIS — J449 Chronic obstructive pulmonary disease, unspecified: Secondary | ICD-10-CM | POA: Diagnosis not present

## 2014-07-25 DIAGNOSIS — M797 Fibromyalgia: Secondary | ICD-10-CM | POA: Diagnosis not present

## 2014-07-30 ENCOUNTER — Other Ambulatory Visit: Payer: Commercial Managed Care - HMO

## 2014-07-30 DIAGNOSIS — E119 Type 2 diabetes mellitus without complications: Secondary | ICD-10-CM | POA: Diagnosis not present

## 2014-07-30 DIAGNOSIS — M797 Fibromyalgia: Secondary | ICD-10-CM | POA: Diagnosis not present

## 2014-07-30 DIAGNOSIS — J449 Chronic obstructive pulmonary disease, unspecified: Secondary | ICD-10-CM | POA: Diagnosis not present

## 2014-07-30 DIAGNOSIS — M545 Low back pain: Secondary | ICD-10-CM | POA: Diagnosis not present

## 2014-07-30 NOTE — Patient Outreach (Signed)
[facility]) Care Management  07/30/2014  Patricia Bass 07-Nov-1960 [id number]  Telephonic Care Management Note  Outreach call #2 to patient for monthly telephonic assessment. Patient not reached.  [phone] Invalid # [phone] Left message [phone] left message RN CM left HIPAA compliant voice message with name and #.   Plan: RN CM will reschedule for next outreach call within 1-2 weeks.

## 2014-08-06 ENCOUNTER — Other Ambulatory Visit: Payer: Commercial Managed Care - HMO

## 2014-08-06 NOTE — Patient Outreach (Signed)
[facility] Customer service manager [facility]) Care Management  08/06/2014  Patricia Bass Oct 26, 1960 [id number]  Unsuccessful outreach letter sent to patient.   [name], RN, BSN, [facility], CCM  [facility] [employer] Management Coordinator [phone] Office [phone] Direct [phone] Cell

## 2014-08-06 NOTE — Patient Outreach (Signed)
[facility]) Care Management  08/06/2014  Patricia Bass [birth year] [id number]  Telephonic Care Management Note  Telephonic Care Management Note  Outreach call # 3 to patient for monthly telephonic assessment. Patient not reached.  [phone] Invalid # [phone] Left message RN CM left HIPAA compliant voice message with name and #.   Plan: RN CM will send unsuccessful outreach letter and close case if no response within 10 days.   [name], RN, BSN, [facility], CCM  [facility] [employer] Management Coordinator [phone] Office [phone] Direct [phone] Cell

## 2014-08-21 ENCOUNTER — Other Ambulatory Visit: Payer: Self-pay

## 2014-08-21 NOTE — Patient Outreach (Signed)
[facility]) Care Management  08/21/2014  Patricia Bass [birth year] [id number]    Telephonic Care Management Note  Outreach call # 4 to patient for monthly telephonic assessment. Patient reached at cell #.  [phone] Invalid # [phone] cell  Patient states she is unable to complete call at this time as she is helping her brother put up a chicken fence.  RN CM advised of importance of contact call to address patient's request for [facility] services.   Patient agreed to call back not before Friday afternoon on 08/23/14.   Plan: RN CM scheduled for next outreach call 08/23/14.   [name], RN, BSN, [facility], CCM  [facility] [employer] Management Care Management Coordinator [phone] Office [phone] Direct [phone] Cell   [phone] home invalid [phone] cell - reached

## 2014-08-23 ENCOUNTER — Other Ambulatory Visit: Payer: Self-pay

## 2014-08-23 NOTE — Patient Outreach (Signed)
[facility]) Care Management  08/23/2014  ELINE GENG 1960/03/04 [id number]  Telephonic Monthly Assessment  Referral Date: 03/19/2014 Referral Source: [facility] Delegation  Triage & Initial Assessment: 03/26/14 Telephonic Case Management Services: 03/26/14  [place] MCD # [id number] [employer] ID # [id number] Admissions: 0 ED visits: 0 Primary MD: Dr. Sinda Du - last appt  07/25/14 Podiatrist: Dr. Berline Lopes  Social:  Patient Phone/Address Verification:  [place], [place] [address] [phone] home [phone] cell  [phone] [employer] phone - 250 minutes per month.  Patient is divorced and disabled; lives in her home with a roommate. Patient has limited mobility and difficulty with walking, cleaning and cooking associated to chronic pain in lower and upper back. H/O difficulty standing for extended periods of time. H/O past physical therapy caused her to have increased pain. Chronic pain management medication regime in place. Brother is living with patient and assisting with things around the house and yard.  PCS: Primary MD, Dr. Luan Pulling sent referral to [facility] [phone] on 04/25/14 for Medicaid Personal Care Service [facility]) worker. Patient did receive a call from someone stating someone would be contacting her to complete a home assessment but no one has called back. RN CM contacted [employer] # [phone] on 05/28/14 regarding status of [facility]) referral application. Contact stated referral received from Dr. Luan Pulling on 04/30/14 but was faxed back to MD office contact: Oretha Milch on 04/30/14 due to incorrect DOB. [facility] has not received corrected form to date. RN CM notified Dr. Luan Pulling on 05/28/14. Contact information as follows: [employer] [facility] [place], [phone] phone, [phone] fax. Patient provides update on 06/25/14 she has not followed up with anyone  on this issue as she has been busy making sure her roommate does not miss any of her MD appt's.  Patient reported on 08/23/14 she has not followed up with Dr. Luan Pulling.   RN CM advised patient should services still be needed that patient should discuss with Dr. Luan Pulling and possible new order may need to be sent since initial order sent 04/25/14.   Patient is able to provide self care and has been working helping her brother put up chicken fence and being outside in the yard as she described stomach cramps following mowing the yard.  RN CM advised patient to discuss this symptom with Dr. Luan Pulling.  RN CM reviewed symptoms of MI and discussed epigastric pain can sometimes be cardiac related. Patient denied any other cardiac symptoms.  Transportation: owns a car and able to drive. Resources: Food Stamps $146.00 per month; [employer] 3rd Wed of the month in [place], [place]. DME: Nebulizer (from a friend), glucometer, glasses.   Advance Directive: Patient received mailed Advance Directive and has completed but has not had document motorized yet.  States contact on document is Cristal Ford and room mate. States she did not elect a family member due to fear they would not carry out her wishes. States she does not wish to be placed on a breathing machine and would like to be cremated due to the cost is less than a traditional burial.  08/23/14 Patient updates she has completed and provided copy to Dr. Luan Pulling.   Diabetes:  Sees Primary MD, Dr. Luan Pulling every 3-4 months. Last appt 07/25/14 Blood sugar target goal: Below 175. Patient has set a personal goal of below 130 and has managed to stay below 130 again this month.  Morning BS 120 today. A1C: States A1C increased from  5.4 to 6.+ but she is not sure what happened to cause the increase.  BP: 138/72 - using Kosher salt and [name]. Wt: 180 and only weighs on MD appt's.   ASA: None ordered.  Exercise: Patient has  purchased a video tape exercise program that has some easy beginner steps about a month ago.  Diet: Patient has made progress towards food selections for Diabetic diet management. Patient is reading food labels and improving her knowledge on the importance of carbohydrate management -Emmi Education: Counting Carbohydrates (Mailed 05/27/14 and reviewed 06/25/14) -Emmi Education: High-fiber foods (Mailed 05/27/14 and reviewed 06/25/14)  Patient's weight has remained stable at 179-180.  Patient has good understanding that weight loss will help in the management of her DM.    Pain / Arthritis Patient updates that MD has advised to use Nabumetone as needed for arthritic pain rather than daily regime to improve GI side effects.   Medications: Patient keeps medications in a lock box due to past issues with her brother taking her meds.  Medication reconciliation completed this call.No issues.   Smoking: H/O when patient's blood sugar goes up; she feels a need to smoke to relieve her stress. Patient has started using Vapors 47m as an intervention to smoking cessation plan. (Currently using 1 fill per day). Patient is not able to provide how many cigarettes this is equivalent to.  Second Smoke: yes  Patient states she is smoking less with her vapor than before and continues to progress in the right direction.  -Emmi Education: Benefits of quitting tobacco (Mailed 05/27/14 and reviewed 06/25/14)  RN CM encouraged patient to continue with her smoking cessation efforts. RN CM reviewed health benefits of smoking cessation and related health complications and risk.   [facility] for [facility]: Written consent received 04/28/2014 and on file.  RN CM advised to please notify MD of any changes in her condition prior to scheduled appt's.  RN CM advised goals of care met and continue with smoking cessation efforts.   RN CM advised to contact PCP should other care coordination services be needed for  [facility]   Plan:  RN CM will notify [facility] LLCof case closure  RN CM will send MD case closure notification letter.   CMariann Laster RN, BSN, [facility]oscopic [facility] CCM  [facility] HFord Motor CompanyManagement Coordinator [phone] [phone] [phone] [phone]

## 2014-09-05 ENCOUNTER — Emergency Department (HOSPITAL_COMMUNITY)
Admission: EM | Admit: 2014-09-05 | Discharge: 2014-09-05 | Disposition: A | Discharge status: Home / Self Care | Payer: Medicare Other | Attending: Emergency Medicine | Admitting: Emergency Medicine

## 2014-09-05 ENCOUNTER — Encounter (HOSPITAL_COMMUNITY): Payer: Self-pay | Admitting: *Deleted

## 2014-09-05 DIAGNOSIS — Z720 Tobacco use: Secondary | ICD-10-CM | POA: Diagnosis not present

## 2014-09-05 DIAGNOSIS — Z760 Encounter for issue of repeat prescription: Secondary | ICD-10-CM | POA: Insufficient documentation

## 2014-09-05 DIAGNOSIS — I2510 Atherosclerotic heart disease of native coronary artery without angina pectoris: Secondary | ICD-10-CM | POA: Diagnosis not present

## 2014-09-05 DIAGNOSIS — E119 Type 2 diabetes mellitus without complications: Secondary | ICD-10-CM | POA: Insufficient documentation

## 2014-09-05 DIAGNOSIS — F1123 Opioid dependence with withdrawal: Secondary | ICD-10-CM | POA: Diagnosis not present

## 2014-09-05 DIAGNOSIS — E039 Hypothyroidism, unspecified: Secondary | ICD-10-CM | POA: Insufficient documentation

## 2014-09-05 DIAGNOSIS — Z79899 Other long term (current) drug therapy: Secondary | ICD-10-CM | POA: Insufficient documentation

## 2014-09-05 DIAGNOSIS — Z8739 Personal history of other diseases of the musculoskeletal system and connective tissue: Secondary | ICD-10-CM | POA: Insufficient documentation

## 2014-09-05 DIAGNOSIS — Z8669 Personal history of other diseases of the nervous system and sense organs: Secondary | ICD-10-CM | POA: Diagnosis not present

## 2014-09-05 DIAGNOSIS — F1193 Opioid use, unspecified with withdrawal: Secondary | ICD-10-CM

## 2014-09-05 DIAGNOSIS — R197 Diarrhea, unspecified: Secondary | ICD-10-CM | POA: Insufficient documentation

## 2014-09-05 DIAGNOSIS — K219 Gastro-esophageal reflux disease without esophagitis: Secondary | ICD-10-CM | POA: Diagnosis not present

## 2014-09-05 DIAGNOSIS — F419 Anxiety disorder, unspecified: Secondary | ICD-10-CM | POA: Insufficient documentation

## 2014-09-05 DIAGNOSIS — Z8679 Personal history of other diseases of the circulatory system: Secondary | ICD-10-CM | POA: Diagnosis not present

## 2014-09-05 DIAGNOSIS — J441 Chronic obstructive pulmonary disease with (acute) exacerbation: Secondary | ICD-10-CM | POA: Insufficient documentation

## 2014-09-05 DIAGNOSIS — Z8701 Personal history of pneumonia (recurrent): Secondary | ICD-10-CM | POA: Diagnosis not present

## 2014-09-05 DIAGNOSIS — R52 Pain, unspecified: Secondary | ICD-10-CM | POA: Diagnosis present

## 2014-09-05 MED ORDER — LEVOTHYROXINE SODIUM 75 MCG PO TABS: 75.0000 ug | ORAL_TABLET | Freq: Every day | ORAL | Status: DC

## 2014-09-05 MED ORDER — TRAZODONE HCL 50 MG PO TABS: 50.0000 mg | ORAL_TABLET | Freq: Every day | ORAL | Status: DC

## 2014-09-05 MED ORDER — METFORMIN HCL 500 MG PO TABS: 500.0000 mg | ORAL_TABLET | Freq: Two times a day (BID) | ORAL | Status: DC

## 2014-09-05 MED ORDER — PRAVASTATIN SODIUM 20 MG PO TABS
20.0000 mg | ORAL_TABLET | Freq: Every day | ORAL | Status: DC
Start: 2014-09-05 — End: 2016-06-20

## 2014-09-05 MED ORDER — ROPINIROLE HCL 0.5 MG PO TABS: 0.5000 mg | ORAL_TABLET | Freq: Three times a day (TID) | ORAL | Status: DC

## 2014-09-05 MED ORDER — PREGABALIN 300 MG PO CAPS: 300.0000 mg | ORAL_CAPSULE | Freq: Two times a day (BID) | ORAL | Status: DC

## 2014-09-05 MED ORDER — OMEPRAZOLE 40 MG PO CPDR: 40.0000 mg | DELAYED_RELEASE_CAPSULE | Freq: Every day | ORAL | Status: DC
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
## 2014-09-05 NOTE — Discharge Instructions (Signed)
Opioid Withdrawal  Opioids are a group of narcotic drugs. They include the street drug heroin. They also include pain medicines, such as morphine, hydrocodone, oxycodone, and fentanyl. Opioid withdrawal is a group of characteristic physical and mental signs and symptoms. It typically occurs if you have been using opioids daily for several weeks or longer and stop using or rapidly decrease use. Opioid withdrawal can also occur if you have used opioids daily for a long time and are given a medicine to block the effect.   SIGNS AND SYMPTOMS  Opioid withdrawal includes three or more of the following symptoms:   · Depressed, anxious, or irritable mood.  · Nausea or vomiting.  · Muscle aches or spasms.    · Watery eyes.     · Runny nose.  · Dilated pupils, sweating, or hairs standing on end.  · Diarrhea or intestinal cramping.  · Yawning.    · Fever.  · Increased blood pressure.  · Fast pulse.  · Restlessness or trouble sleeping.  These signs and symptoms occur within several hours of stopping or reducing short-acting opioids, such as heroin. They can occur within 3 days of stopping or reducing long-acting opioids, such as methadone. Withdrawal begins within minutes of receiving a drug that blocks the effects of opioids, such as naltrexone or naloxone.  DIAGNOSIS   Opioid use disorder is diagnosed by your health care provider. You will be asked about your symptoms, drug and alcohol use, medical history, and use of medicines. A physical exam may be done. Lab tests may be ordered. Your health care provider may have you see a mental health professional.   TREATMENT   The treatment for opioid withdrawal is usually provided by medical doctors with special training in substance use disorders (addiction specialists). The following medicines may be included in treatment:  · Opioids given in place of the abused opioid. They turn on opioid receptors in the brain and lessen or prevent withdrawal symptoms. They are gradually  decreased (opioid substitution and taper).  · Non-opioids that can lessen certain opioid withdrawal symptoms. They may be used alone or with opioid substitution and taper.  Successful long-term recovery usually requires medicine, counseling, and group support.  HOME CARE INSTRUCTIONS   · Take medicines only as directed by your health care provider.  · Check with your health care provider before starting new medicines.  · Keep all follow-up visits as directed by your health care provider.  SEEK MEDICAL CARE IF:  · You are not able to take your medicines as directed.  · Your symptoms get worse.  · You relapse.  SEEK IMMEDIATE MEDICAL CARE IF:  · You have serious thoughts about hurting yourself or others.  · You have a seizure.  · You lose consciousness.  Document Released: 12/24/2002 Document Revised: 05/07/2013 Document Reviewed: 01/03/2013  ExitCare® Patient Information ©2015 [employer]. This information is not intended to replace advice given to you by your health care provider. Make sure you discuss any questions you have with your health care provider.
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
## 2014-09-11 NOTE — Patient Outreach (Signed)
[facility]) Care Management  09/11/2014  Patricia Bass May 13, 1960 [id number]   Notification from Mariann Laster, RN to close case due to goals met.  Thanks, Ronnell Freshwater. [place], [place] Assistant Phone: [phone] Fax: [phone]

## 2014-09-20 ENCOUNTER — Other Ambulatory Visit: Payer: Self-pay
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
## 2014-09-23 ENCOUNTER — Other Ambulatory Visit: Payer: Self-pay

## 2014-09-23 DIAGNOSIS — E119 Type 2 diabetes mellitus without complications: Secondary | ICD-10-CM

## 2014-09-23 NOTE — Addendum Note (Signed)
Addended by: Alinda Money on: 09/23/2014 04:14 PM   Modules accepted: Orders

## 2014-09-23 NOTE — Patient Outreach (Signed)
[facility]) Care Management  09/23/2014  Patricia Bass [birth year] [id number]   Telephonic Care Coordination Note  Outreach call to [facility] [phone] RN CM spoke with contact: Vincenza Hews Issue:  Verification on PCS application.  Vincenza Hews states no application or file.  States MD will need to submit a new application.   Plan:  RN CM will refer to [facility] SW for [facility] application assistance.  RN CM will contact patient with update on Plan of Care.    [name], RN, BSN, [facility], CCM  [facility] [employer] Management Coordinator [phone] Direct [phone] Cell [phone] Office [phone] Fax

## 2014-09-23 NOTE — Patient Outreach (Signed)
[facility]) Care Management  09/23/2014  Patricia Bass 09-04-1960 [id number]   Request from [name], RN to assign SW; assigned Patricia Jews, LCSW for patient outreach.  Damita L. Rhodie, AAS [facility] Care Management Assistant

## 2014-09-23 NOTE — Patient Outreach (Addendum)
[facility]) Care Management  09/23/2014  TANEISHA FUSON [birth year] [id number]   Care Coordination Note  Outreach call to patient with [facility] update.  Advised in need to submit new application.  RN CM confirmed patient consents to [facility] SW services for [facility] application referral.  RN CM will contact [name] office to provide update.   RN CM advised in next scheduled follow-up within 30 days.   [name], RN, BSN, [facility], CCM  [facility] [employer] Management Coordinator [phone] Direct [phone] Cell [phone] Office [phone] Fax

## 2014-09-23 NOTE — Patient Outreach (Signed)
[facility]) Care Management  09/23/2014  TANVEER DOBBERSTEIN 1960/10/13 [id number]   Care Coordination Note  Outbound call to Primary MD, [name].  Contact:  Gretchen Portela, LPN RN CM left voice message requesting call back to discuss PCS application.  RN CM requesting confirmation that PCS application was submitted to [employer].   RN CM advised due to no record on file of April/May application with [employer]; new application needs submitting and [facility] SW referral has been sent for application assistance.   [name], RN, BSN, [facility], CCM  [facility] [employer] Management Coordinator [phone] Direct [phone] Cell [phone] Office [phone] Fax

## 2014-09-25 ENCOUNTER — Other Ambulatory Visit: Payer: Self-pay | Admitting: Licensed Clinical Social Worker

## 2014-09-25 NOTE — Patient Outreach (Signed)
Assessment: CSW received referral on Patricia Bass. CSW completed chart review on client on 09/25/14.  Client is in need of Personal Care Service application completion by her primary doctor, Dr. [name].  [name] had previously submitted Personal Care Service application for client to [facility].  [facility] representative reported to [facility] telephonic RN Patricia Bass recently that [name] would need to re-submit Personal Care Service application for client to [facility]. CSW called office of [name] on 09/25/14 and left phone message for Patricia Bass, Licensed Practical Nurse, at practice of [name].  In phone message left for Patricia Bass, CSW introduced self, gave Patricia Bass the return phone number of CSW ([phone]) and made request for [name] to please complete a new Personal Care Service application for Patricia Bass and please fax completed application to [facility] for review.  In phone message left for Patricia Bass, CSW also mentioned that previously [facility] had returned Personal Care Service application for client related to a date of birth issue on application. CSW suggested to Patricia Bass, in phone message, that she may want to call [facility] at [phone] to discuss date of birth on record at [facility] for client.  In phone message for Patricia Bass, CSW invited Patricia Bass to return call to CSW as needed to further discuss current needs of client.  Plan: Patricia Bass, Licensed Practical Nurse, to contact [facility] to discuss date of birth on record at [facility] for client. Patricia Bass to discuss with [name] need for new Personal Care Service application for client as requested by [facility]. CSW to communicate with office of [name] as needed related to Personal Care Service application completion for client.  [name].[place]rest MSW, LCSW Licensed  Clinical Social Worker [facility] Care Management [phone]
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
## 2014-10-10 ENCOUNTER — Encounter: Payer: Self-pay | Admitting: Licensed Clinical Social Worker

## 2014-10-17 ENCOUNTER — Other Ambulatory Visit: Payer: Commercial Managed Care - HMO
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
## 2014-10-30 ENCOUNTER — Other Ambulatory Visit: Payer: Self-pay | Admitting: Licensed Clinical Social Worker

## 2014-10-30 NOTE — Patient Outreach (Signed)
Assessment: CS[facility] called [facility] on 10/30/14 and spoke with Tammie at [facility].  CSW verified identity of Tammie at [facility].  CSW and Tammie spoke of status of Personal Care Services application for client.  Dr. Juanetta GoslingHawkins, primary doctor for client, had sent in Personal Care Services application for client in April of 2016. That application was denied related to an issue about date of birth of client. CSW had called Gretchen PortelaJoy Smith, Licensed Practical Nurse, at office of Dr. Juanetta GoslingHawkins and left a phone message for Gretchen PortelaJoy Smith on 09/25/14 requesting that Dr. Juanetta GoslingHawkins please again submit Personal Care Services application for client to [facility]. When CSW spoke with Tammie at [facility] on 10/30/14, Tammie said that no other Personal Care Services application had been submitted [facility] by Dr. Juanetta GoslingHawkins since first application for these services in April of 2016.  CSW thanked Tammie for this information.  CSW then called office of Dr. Juanetta GoslingHawkins on 10/30/14 and again, for second time, left phone message for Gretchen PortelaJoy Smith, Licensed Practical Nurse, for Dr. Juanetta GoslingHawkins.  In phone message for Gretchen PortelaJoy Smith from CSW, CSW stated that [facility] is still in need of a new Personal Care Services application for client. CSW informed Gretchen PortelaJoy Smith in phone message that [place] 14-Mar-2060.  CSW also informed Joy in phone message that date of birth of client of 14-Mar-2060 is also date of birth that [facility] has listed for client in their system.  CSW requested in phone message for Gretchen PortelaJoy Smith on 10/30/14 that Dr. Juanetta GoslingHawkins please complete Personal Care Services application for client and fax completed application to [facility].  Client has stated that she is in need of personal care support and client is hopefull that she will eventually be able to receive personal care support in the home.   Plan: CSW informed RN Crystal Hutchinson of the above  information on 10/30/14. CSW to call client next week to assess needs of client.  Kelton PillarMichael S.Rodina Pinales MSW, LCSW Licensed Clinical Social Worker [facility] Care Management [phone]

## 2014-11-05 ENCOUNTER — Other Ambulatory Visit: Payer: Self-pay | Admitting: Licensed Clinical Social Worker

## 2014-11-05 NOTE — Patient Outreach (Signed)
Assessment CSW received phone message from [place], [place], at practice of Dr. Shaune PollackEd Hawkins [place]. In phone message left  by RN for CSW, Kriste BasqueBecky reported that Dr. Juanetta GoslingHawkins was in process of completing Personal Care Service application for client. In phone message for CSW, Kriste BasqueBecky also reported that she would fax completed Personal Care Service application for client to [facility] once application was completed.    Plan: CSW informed RN Crystal Hutchinson of the above information. CSW has scheduled call with client for 11/07/14.  On 11/07/14, CSW to inform client of above information regarding Personal Care Service application for client.  Kelton PillarMichael S.Janitza Revuelta MSW, LCSW Licensed Clinical Social Worker [facility] Care Management [phone]

## 2014-11-07 ENCOUNTER — Encounter: Payer: Self-pay | Admitting: Licensed Clinical Social Worker

## 2014-11-07 ENCOUNTER — Other Ambulatory Visit: Payer: Self-pay | Admitting: Licensed Clinical Social Worker
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
## 2014-11-11 DIAGNOSIS — M797 Fibromyalgia: Secondary | ICD-10-CM | POA: Diagnosis not present

## 2014-11-11 DIAGNOSIS — G4730 Sleep apnea, unspecified: Secondary | ICD-10-CM | POA: Diagnosis not present

## 2014-11-11 DIAGNOSIS — J449 Chronic obstructive pulmonary disease, unspecified: Secondary | ICD-10-CM | POA: Diagnosis not present

## 2014-11-11 DIAGNOSIS — Z23 Encounter for immunization: Secondary | ICD-10-CM | POA: Diagnosis not present

## 2014-11-11 DIAGNOSIS — E119 Type 2 diabetes mellitus without complications: Secondary | ICD-10-CM | POA: Diagnosis not present

## 2014-11-14 ENCOUNTER — Other Ambulatory Visit (HOSPITAL_COMMUNITY): Payer: Self-pay | Admitting: Respiratory Therapy

## 2014-11-14 ENCOUNTER — Other Ambulatory Visit: Payer: Commercial Managed Care - HMO

## 2014-11-14 DIAGNOSIS — G4730 Sleep apnea, unspecified: Secondary | ICD-10-CM

## 2014-11-14 NOTE — Patient Outreach (Signed)
[facility] Care Management  11/14/2014  Marton RedwoodCarrie B Goins 12-17-1960 [id number]  Telephonic Monthly Assessment  Outreach call #1 to patient.  Patient not reached.  RN CM left HIPAA compliant voice message with name and number requesting call back.  RN CM will scheduled for next outreach call within 1-2 weeks.   Donato Schultzrystal Aletha Allebach, RN, BSN, [facility], CCM  [facility] Care Management Care Management Coordinator [phone] Direct [phone] Cell [phone] Office [phone] Fax

## 2014-11-21 ENCOUNTER — Ambulatory Visit: Payer: Self-pay

## 2014-11-26 ENCOUNTER — Other Ambulatory Visit: Payer: Self-pay | Admitting: Licensed Clinical Social Worker

## 2014-11-26 ENCOUNTER — Ambulatory Visit: Payer: Self-pay

## 2014-11-26 NOTE — Patient Outreach (Signed)
Assessment:  CSW called client on 11/26/14 and spoke via phone with client on 11/26/14.  CSW verified identity of client. CSW and client spoke of client needs.  Client continues to communicate with [facility] telephonic RN Aggie Cosierrystal Hutchinson regarding nursing support for client.  Dr. Juanetta GoslingHawkins, primary doctor for client, recently completed Personal Care Service application for client and completed application for Personal Care Services for client was faxed to [facility].  CSW asked client if she had been contacted by [facility] for in home nursing assessment.  Client reported to CSW on 11/26/14 that [facility] representative had called client recently regarding in home nursing assessment. Client reported to CSW that nurse assessor with [facility] was scheduled to conduct home visit with client on 11/27/14 to assess in home care nursing needs for client.  Client is hoping that she will soon be approved to receive in home care aide support, as scheduled.  Client does have Medicaid benefit.  Client and CSW spoke of care plan goal for client.  CSW encouraged client to call CSW as needed at [phone] to discuss social work needs of client.   Plan: Client [facility] with [facility] nurse assessor at home of client on 11/27/14 for nursing assessment of in home care needs of client. CSW to call client in three weeks to assess needs of client at that time.  Kelton PillarMichael S.Sharen Youngren MSW, LCSW Licensed Clinical Social Worker [facility] Care Management [phone]

## 2014-12-12 ENCOUNTER — Other Ambulatory Visit: Payer: Self-pay
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
## 2014-12-16 ENCOUNTER — Other Ambulatory Visit: Payer: Self-pay | Admitting: Licensed Clinical Social Worker

## 2014-12-16 NOTE — Patient Outreach (Signed)
Assessment:  CSW called home phone number of client on 12/16/14.  CSW spoke via phone with client. CSW verified identity of client. Client said she has her prescribed medications and is taking medications as prescribed. Client now has medication box to use as needed for prescribed medications. Client also has instructions on healthy eating related to managing diabetes.  Client said she has some support from her brother. Client said she drives herself to local appointments. She said she has another appointment scheduled with Dr. Luan Pulling in February of 2017. She said she met recently with nurse from [facility] for home health assessment.  She said RN from [facility] did home assessment for care needs for client. Client said she was not sure of results of home care needs assessment at this time. She is waiting to hear from [facility] regarding results of home care needs assessment for client.  Client has Medicare and Medicaid benefit.  She also said she receives Social Security Disability monthly benefit.  She said she has some financial challenges.  CSW and client spoke of client care plan. Client said she would call [facility] in next 30 days to check on status of in home care request for client.  CSW gave client phone number of [facility] [phone]).  Client informed CSW that she would call that number for [facility] later this week to check on status of in home care request for client.  CSW also gave client [place] number [phone]) and encouraged client to call CSW as needed to discuss social work needs of client.  Plan: Client to call [facility] this week to inquire about status of in home care request of client. CSW to call client in two weeks to assess status/needs of client.  Norva Riffle.Sherl Yzaguirre MSW, LCSW Licensed Clinical Social Worker [facility] Care Management [phone]

## 2014-12-26 ENCOUNTER — Other Ambulatory Visit: Payer: Self-pay | Admitting: Licensed Clinical Social Worker

## 2014-12-26 NOTE — Patient Outreach (Signed)
Assessment:  CSW called client on 12/26/14 and spoke via phone with client on 12/26/14. CSW verified identity of client. Client and CSW spoke of client needs.  CSW asked client about [facility] support for client.  [facility] had recently done RN nursing assessment on client related to in home care needs.  Lyla SonCarrie reported to CSW on 12/26/14 that she had not received a phone call from [facility] regarding in home support nor had she received any information in writing from [facility] regarding in home care support.  CSW again gave client the phone number of [facility] and encouraged client to call [facility] to inquire about in home support assistance for client through [facility]. Client wrote down phone number for [facility] and said she would call [facility] agency soon to inquire about in home assistance support for client. CSW thanked client for phone call with CSW on 12/26/14.  Plan: Client to call [facility] agency to inquire about in home assistance support for client through that agency. CSW to call client in two weeks to assess client needs.  Kelton PillarMichael S.Ramond Darnell MSW, LCSW Licensed Clinical Social Worker [facility] Care Management [phone]

## 2015-01-09 ENCOUNTER — Ambulatory Visit: Payer: Self-pay

## 2015-01-09 ENCOUNTER — Other Ambulatory Visit: Payer: Self-pay | Admitting: Licensed Clinical Social Worker

## 2015-01-09 NOTE — Patient Outreach (Signed)
Assessment:  CSW called client home phone number on 01/09/15. CSW left phone message for client on 01/09/15 requesting client to return call to CSW at [phone] to discuss client needs. Client has been communicating with telephonic [facility] nurse Crystal Hutchinson for nursing support. Client has been seeking personal care assistance with [facility].  Client reports that she has reduced family support and is hoping to receive personal care support services in the home.  CSW did not receive return call from client on 01/09/15.   Plan: CSW to call client in two weeks to communicate with client at that time regarding current care needs of client.   Kelton PillarMichael S.Shylee Durrett MSW, LCSW Licensed Clinical Social Worker [facility] Care Management [phone]

## 2015-01-10 ENCOUNTER — Other Ambulatory Visit: Payer: Self-pay

## 2015-01-10 NOTE — Patient Outreach (Signed)
[facility](THN) Care Management  01/10/2015  Patricia Bass 04-21-1960 [id number]   Outreach call #1 to patient for telephonic monthly assessment.  Patient not reached.  RN CM left HIPAA compliant voice message with name and number for call back.  RN CM scheduled for next outreach call within one week.   Donato Schultzrystal Carmyn Hamm, RN, BSN, [facility], CCM  [facility] Care Management Care Management Coordinator [phone] Direct [phone] Cell [phone] Office [phone] Fax

## 2015-01-14 ENCOUNTER — Other Ambulatory Visit: Payer: Self-pay

## 2015-01-14 NOTE — Patient Outreach (Signed)
[facility]N) Care Management  01/14/2015  Marton RedwoodCarrie B Goins [birth year] [id number]   Outreach call #2 to patient for telephonic monthly assessment.  Patient not reached.  RN CM left HIPAA compliant voice message with name and number for call back.  RN CM scheduled for next outreach call within one week.   Donato Schultzrystal Kashif Pooler, RN, BSN, [facility], CCM  [facility] Care Management Care Management Coordinator [phone] Direct [phone] Cell [phone] Office [phone] Fax

## 2015-01-16 ENCOUNTER — Ambulatory Visit: Payer: Self-pay

## 2015-01-17 ENCOUNTER — Other Ambulatory Visit: Payer: Self-pay
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
## 2015-01-21 ENCOUNTER — Encounter: Payer: Self-pay | Admitting: Licensed Clinical Social Worker

## 2015-01-21 ENCOUNTER — Other Ambulatory Visit: Payer: Self-pay | Admitting: Licensed Clinical Social Worker

## 2015-01-21 NOTE — Patient Outreach (Signed)
Assessment:  CSW called client home phone number on 01/21/15. CSW spoke via phone with client on 01/21/15. CSW verified client identity. CSW and [place] spoke of client needs. CSW and [place] spoke of client care plan. She said she had called [facility] recently and talked with representative. She said however that she had still not received a letter in writing from [facility] regarding their decision about Personal Care Services for client in the home. She also said [facility] had not called her recently to discuss Personal Care Services for client.  Client did have an  in home assessment through [facility].  Client said she could use in home care support. CSW encouraged client to again call [facility] to inquire about personal care services support for client.  She said she has ongoing pain issues, has mobility issues, and has little family support. She said her Medicaid was active.  She said she is sleeping adequately. She said she always has reduced energy, low energy levels.  She has been receiving telephonic [facility] nursing support with [name].  Crystal and CSW have collaborated regarding in home needs of client.  Client has been attending scheduled client medical appointments with [name], client's primary care physician.  Client said she had her prescribed medications. She said she often only eats about one meal daily. She said her weight has remained fairly stable.  CSW and client spoke of her attending her scheduled medical appointments.  CSW thanked client for phone call with CSW on 01/21/15. Client was appreciative of phone call from CSW on 01/21/15.  Plan: Client to continue to call [facility] in next 30 days to inquire about Personal Care Services support for client. CSW to collaborate with RN Crystal Hutchinson in discussing in home care needs of client. CSW to call client in 4 weeks to assess client needs at that time.  [name].Shera Laubach MSW, LCSW Licensed Clinical Social Worker [facility] Care Management [phone]

## 2015-02-04 ENCOUNTER — Other Ambulatory Visit: Payer: Self-pay

## 2015-02-04 NOTE — Patient Outreach (Signed)
[facility]) Care Management  02/04/2015  Patricia Bass [birth year] [id number]  Care Coordination Services  Issue:  No updates on PCS application status. H/o Home assessment completed by [facility] 12/2014 per patient.    RN CM contacted Tammy at [employer] [facility] [place]  [phone]  Tammy states no home assessment has been completed.  States new request received 11/06/14 but was rejected due to no home address or DOB on application. Patient was advised of this information on 12/06/2014 and requested to notify her MD and request new form with entire completed information. No form updates have been received since that time.    Plan:  RN CM will notify [facility] SW and Primary MD of this update.  New PCS form submission required and all information must be completed or will be rejected.   [name], RN, BSN, [facility], CCM  [facility] [employer] Management Coordinator [phone] Direct [phone] Cell [phone] Office [phone] Fax

## 2015-02-05 ENCOUNTER — Other Ambulatory Visit: Payer: Self-pay

## 2015-02-05 NOTE — Patient Outreach (Signed)
[facility]) Care Management  02/05/2015  Patricia Bass 01/18/1960 [id number]   Care Coordination Services  Contact call to [name]: office contact: Kriste Basque RN CM provided update on rejected PCS application form submitted on 11/06/2014 due to form did not include patient's address.  Advised that form will be rejected if incomplete of any information required.   RN CM requested new updated order (Form [id number]) be faxed.  RN CM requested copy also be faxed to RN CM for review and follow-up with [facility].   Kriste Basque confirms she will complete and submit/fax as discussed.    Plan: RN CM will continue to follow PCS order resolution.    [name], RN, BSN, [facility], CCM  [facility] [employer] Management Coordinator [phone] Direct [phone] Cell [phone] Office [phone] Fax

## 2015-02-05 NOTE — Patient Outreach (Signed)
[facility] Customer service manager [facility]) Care Management  02/05/2015  Patricia Bass May 18, 1960 [id number]   Providers: Primary MD: Dr. [name] - last appt11/2016 - next appt 02/27/2014 Podiatrist: Dr. Pricilla Holm [facility] location) - patient has been missing scheduled appt's.  States plan to schedule with a different MD in [place] location due to inconvenience with out of town location.    RN CM offered assistance with scheduling.  Patient states she will call and schedule this appt.   RN CM will continue to monitor compliance with scheduling this appt.   Insurance: UHC effective 09/05/14   [place] MCD # [id number]  Social:  Patient Phone/Address Verification:  [address] [place], [place] [address] [phone] home [phone] cell  [phone] Obama phone - 250 minutes per month.  Patient is divorced and disabled.  H/o patient has been living in  her home with a room mate: Colin Rhein and states room mate is in process of moving out.  Ambulation: Patient has limited mobility and difficulty with walking, cleaning and cooking associated to chronic pain; fibromyalgia. Patient has difficulty standing for extended periods of time. Patient reported h/o past physical therapy caused her to have increased pain. Exercise: Patient is not able to exercise due to pain.  Chronic pain management medication regime in place.  PCS: H/o RN CM contacted Tammy at [employer] [facility] [place] [phone] on 02/04/2015.  Tammy stated no home assessment has been completed. States new request received 11/06/14 but application was rejected due to no home address or DOB on application.  Tammy reported that Patient was advised of this information on 12/06/2014 and requested to notify her MD and request new form with entire completed information. No form updates have been received since that time.  RN CM provided patient this update.   RN CM advised that RN CM will contact PCP for new order and  discuss processing issues.   Transportation: owns a car and able to drive. Resources: Food Stamps $146.00 per month; [employer] 3rd Wed of the month in [place], [place].  Advance Directive:  Yes but needs to replace due to needing to change HCPOA.  New document mailed to patient 02/05/2015 for patient to create new document.  H/o AD included HCPOA, [name]/friend and room mate. Patient did not elect a family member due to fear they would not carry out her wishes. Patient does not wish to be placed on a breathing machine and would like to be cremated due to the cost is less than a traditional burial. Patient provided completed copy of AD to [name] office on 08/23/14, however needs to update this form to new HCPOA.   RN CM will mail new document to patient.  DME: Nebulizer (from a friend), glucometer, glasses, Scales and planning to purchase BP cuff from [facility] resource book. (7 day medication box to improve medication adherence (mailed to patient 12/12/2014)  Diabetes:  Sees Primary MD, [name] every 3-4 months.  MD Blood sugar target goal: Below 175.  States BS range of 124-158 A1C:6.2 (per patient) Diet: using Kosher salt and [name]. Wt: 180  H/o [employer] mailed on 10/17/2014 (received and reviewed 12/12/14). -Diabetes Diet - Type 2 -How Type 2 Diabetes Can Affect Your Body -Carb counting and meal planning  -Healthy Habits of Living with Diabetes - Carbohydrate counting -Nutrition and Diabetes Education  -The Emotional Side of Diabetes - 10 Things you Need to Know -Basic Carb counting Chart -[facility] 2017 Health Care Management Calendar.  Pain / Arthritis / Fibromyalgia Patient states MD medication for Fibromyalgia has slightly improved symptoms.  Medications: Patient keeps medications in a lock box due to past issues with her brother or his visitors taking her meds. Flu Vaccine: 2016 yes Pneumonia vaccine:  pending.   Pharmacy: [facility] Mail Order.90 day supplies.  Using 7 day medication box.   Smoking: Currently smoking less than one pack a day.  Second Smoke: yes  Patient uses Vapors  as an intervention for her smoking cessation plan. Patient prefers vapors and states she has used patches in the past and did not work.  H/o -Emmi Education: Benefits of quitting tobacco (Mailed 05/27/14 and reviewed 06/25/14)  Patient using distraction of computer games to prevent smoking cravings.    Plan:  Referral Date: 09/16/14 Telephonic Screen and Initial Assessment 09/20/2014 Case Management start date: 09/20/2014 Program: Diabetes 09/20/2014  RN CM encouraged close monitoring of BS readings and remain compliant with medications.  RN CM praised patient for weight loss and encouraged to continue to eat health.  RN CM will contact [name] for [facility] order.   RN CM encouraged safety/fall prevention.  RN CM advised in next contact call for telephonic monthly assessment within 30 days and care coordination services as needed.   RN CM advised to please notify MD of any changes in condition prior to scheduled appt's.  RN CM provided contact name and # [phone] or main office # [phone] and 24-hour nurse line # [phone].  RN CM confirmed patient is aware of 911 services for urgent emergency needs. RN CM advised in next follow-call within 30 days with Telephonic Monthly Assessment and care coordination services as needed.   [name], RN, BSN, [facility], CCM  [facility] [employer] Management Coordinator [phone] Direct [phone] Cell [phone] Office [phone] Fax

## 2015-02-14 ENCOUNTER — Other Ambulatory Visit: Payer: Self-pay

## 2015-02-14 NOTE — Patient Outreach (Signed)
[facility]) Care Management  02/14/2015  Patricia Bass 1960-11-25 [id number]   Telephonic Monthly Assessment and Care Coordination Services  Referral Date: 09/16/14 Telephonic Screen and Initial Assessment 09/20/2014 Case Management start date: 09/20/2014 Program: Diabetes 09/20/2014  Providers: Primary MD: Dr. Kari Bass - last appt11/2016 - missed appt this week due to thought appt was at 11:30 but was at 11:00  - rescheduled for 03/10/15 at 3:00 Sleep Study appt 02/28/1015  Podiatrist: Dr. Pricilla Bass [facility] location) - patient has been missing scheduled appt's. States plan to schedule with a different MD in [place] location due to inconvenience with out of town location.  Insurance: UHC effective 09/05/14   [place] MCD # [id number]  Social:  Patient Phone/Address Verification:  [address] [place], [place] [address] [phone] home [phone] cell  [phone] Patricia Bass phone - 250 minutes per month.  Patient is divorced and disabled. Ambulation: Patient has limited mobility and difficulty with walking, cleaning and cooking associated to chronic pain; fibromyalgia. Patient has difficulty standing for extended periods of time. Patient reported h/o past physical therapy caused her to have increased pain. Exercise: Patient is not able to exercise due to pain.  Chronic pain management medication regime in place.  PCS: H/o RN CM contacted Patricia Bass at [employer] [facility] [place] [phone] on 02/04/2015.  Patricia Bass stated no home assessment has been completed. States new request received 11/06/14 but application was rejected due to no home address or DOB on application. Patricia Bass reported that Patient was advised of this information on 12/06/2014 and requested to notify her MD and request new form with entire completed information but No form updates have been received since that time. RN CM notified Dr. Juanetta Bass on 02/05/2015 and requested new form  submission and reason form delay in processing.   Patient states no updates or phone calls received from [facility] since 02/05/2015.  RN CM will follow-up with [employer] to confirm receipt of application.   Transportation: owns a car and able to drive. Resources: Food Stamps $146.00 per month; [employer] 3rd Wed of the month in [place], [place].  Advance Directive: Yes but needs to replace due to needing to change HCPOA. New document mailed to patient 02/05/2015 for patient to create new document. H/o AD included HCPOA, Patricia Bass/friend and room mate. Patient did not elect a family member due to fear they would not carry out her wishes. Patient does not wish to be placed on a breathing machine and would like to be cremated due to the cost is less than a traditional burial. Patient provided completed copy of AD to Dr. Juanetta Bass office on 08/23/14, however needs to update this form to new HCPOA.  RN CM will mail new document to patient. Patient plans to discuss with MD on 03/10/15 appt.  DME: Nebulizer (from a friend), glucometer, glasses, Scales and planning to purchase BP cuff from [facility] resource book. (7 day medication box to improve medication adherence (mailed to patient 12/12/2014)  Diabetes:  Sees Primary MD, Dr. Juanetta Bass every 3-4 months. (next appt 03/10/2015) MD Blood sugar target goal: Below 175.  States BS range of 124-158 A1C:6.2 (per patient) Diet: using Kosher salt and [name]. Wt: 180  H/o [employer] mailed on 10/17/2014 (received and reviewed 12/12/14). -Diabetes Diet - Type 2 -How Type 2 Diabetes Can Affect Your Body -Carb counting and meal planning  -Healthy Habits of Living with Diabetes - Carbohydrate counting -Nutrition and Diabetes Education  -The Emotional Side of Diabetes - 10 Things  you Need to Know -Basic Carb counting Chart -[facility] 2017 Health Care Management Calendar.  Pain / Arthritis / Fibromyalgia Patient states MD  medication for Fibromyalgia has slightly improved symptoms.  Medications: Patient keeps medications in a lock box due to past issues with her brother or his visitors taking her meds. Flu Vaccine: 2016 yes Pneumonia vaccine: pending.  Pharmacy: [facility] Mail Order.90 day supplies.  Using 7 day medication box.  Patient C/O pain medication making her sleepy.   Patient mentioned she thought that Adderal may help her fatigue, sleepiness and help get her going.   Smoking: Currently smoking less than one pack a day.  Second Smoke: yes  Patient uses Vapors 6 mg as an intervention for her smoking cessation plan. Patient prefers vapors and states she has used patches in the past and did not work.  H/o -Emmi Education: Benefits of quitting tobacco (Mailed 05/27/14 and reviewed 06/25/14)  Patient using distraction of computer games to prevent smoking cravings.   Plan:  RN CM encouraged close monitoring of BS readings and remain compliant with medications.  RN CM encouraged safety/fall prevention. RN CM will contact [employer] to follow-up on PCS order.   RN CM encouraged patient to schedule appt's in afternoon to avoid missing appts and give patient more time to get up and get moving in the morning hours.  RN CM stressed importance of not missing appts.  RN CM encouraged to report any medication side effects to MD.  RN CM advised to only take smallest dose of pain medication necessary to manage pain as medication is habit forming.   RN CM encouraged patient to discuss option of PT with MD rather than Adderal Rx which would help her mobility, pain management and avoid use of medications.  RN CM advised in next contact call for telephonic monthly assessment within 30 days and care coordination services as needed.   RN CM advised to please notify MD of any changes in condition prior to scheduled appt's.  RN CM provided contact name and # [phone] or main office # [phone] and 24-hour  nurse line # [phone].  RN CM confirmed patient is aware of 911 services for urgent emergency needs.  [name], RN, BSN, [facility], CCM  [facility] [employer] Management Coordinator [phone] Direct [phone] Cell [phone] Office [phone] Fax

## 2015-02-14 NOTE — Patient Outreach (Signed)
Tri[facility] Customer service manager [facility]) Care Management  02/14/2015  Patricia Bass 1960-08-31 [id number]   Care Coordination Services.   Contact call to [employer] [facility] [place] [phone] - contact [place].  Issue:  Verification of updated and new PCS application received from [name] since 02/05/2015.  [place] MCD # [id number] S Contact states application not receive or on file.   Plan RN CM will fax copy to [facility] via [employer] system.  [employer] fax # confirmed:  [phone]  [name], RN, BSN, [facility], CCM  [facility] Care Management Care Management Coordinator [phone] Direct [phone] Cell [phone] Office [phone] Fax

## 2015-02-20 ENCOUNTER — Other Ambulatory Visit: Payer: Self-pay | Admitting: Licensed Clinical Social Worker

## 2015-02-20 NOTE — Patient Outreach (Signed)
Assessment:  CSW called client home phone number on 02/20/15 and spoke via phone with client on 02/20/15. CSW verified client identity. CSW and Patricia Bass spoke of client needs  Client said that [name] is sending another Personal Care Services form to Dr. Juanetta Bass to complete for client.  Client is waiting for Dr Patricia Bass to complete a new Personal Care Services form for client and to fax form back, once completed, to [facility]. Client said she has an appontment with Dr. Juanetta Bass scheduled for 03/10/15.   She said she has decreased energy, fatiques easiliy. She said she has talked with Dr. Juanetta Bass previously about these symptoms. Client is talking with Patricia Hutchinson RN regarding nursing needs of client.  Client said she has her prescribed medications and is taking medications as prescribed.  Client has [employer].  She is eating adequately.  She has reduced appetite.  CSW and client spoke of client care plan.   CSW encouraged client to attend all scheduled client medical appointments in next 30 days. Client agreed to this care plan.  CSW encouraged Patricia Bass CSW at [phone] as needed for social work support.  Plan: Client to attend all scheduled client medical appointments in next 30 days. CSW to Bass client in 4 weeks to assess client needs.  Patricia Bass.Patricia Bass MSW, LCSW Licensed Clinical Social Worker [facility] Care Management [phone]

## 2015-03-05 ENCOUNTER — Other Ambulatory Visit: Payer: Self-pay

## 2015-03-05 NOTE — Patient Outreach (Signed)
[facility] Customer service manager [facility]) Care Management  03/05/2015  Patricia Bass 08-Jan-1960 [id number]   Outreach call attempt #1 for telephonic monthly assessment.   Patient not reached.  RN CM left voice message with name and number for call back.  RN CM scheduled for next outreach call within one week.    [name], RN, BSN, [facility], CCM  [facility] [employer] Management Coordinator [phone] Direct [phone] Cell [phone] Office [phone] Fax

## 2015-03-07 ENCOUNTER — Other Ambulatory Visit: Payer: Self-pay

## 2015-03-07 NOTE — Patient Outreach (Signed)
[facility]) Care Management  03/07/2015  [name] 06/27/1960 [id number]   Outreach call attempt #2 for telephonic monthly assessment.  Patient not reached.  RN CM left voice message with name and number for call back.  RN CM scheduled for next outreach call within one week.  Donato Schultzrystal Jobe Mutch, RN, BSN, [facility], CCM  [facility] Care Management Care Management Coordinator [phone] Direct [phone] Cell [phone] Office [phone] Fax

## 2015-03-07 NOTE — Patient Outreach (Signed)
[facility]) Care Management  03/07/2015  [name] 1960-04-18 [id number]   Care Coordination Services.   Contact call to [employer] [place] [phone] - contact: [place]  Issue: Verification of updated and new PCS application received from Dr. Juanetta GoslingHawkins since 02/05/2015.  [place] Medicaid # [id number] S Contact verified last application received November 2016.  Plan:   RN CM routed copy of PCS Form DMA 3051 on file in Epic completed by Dr. Juanetta GoslingHawkins on 02/05/2015.   Donato Schultzrystal Ameenah Prosser, RN, BSN, [facility], CCM  [facility] Care Management Care Management Coordinator [phone] Direct [phone] Cell [phone] Office [phone] Fax

## 2015-03-10 ENCOUNTER — Ambulatory Visit: Payer: Self-pay

## 2015-03-10 DIAGNOSIS — J309 Allergic rhinitis, unspecified: Secondary | ICD-10-CM | POA: Diagnosis not present

## 2015-03-10 DIAGNOSIS — E119 Type 2 diabetes mellitus without complications: Secondary | ICD-10-CM | POA: Diagnosis not present

## 2015-03-10 DIAGNOSIS — J449 Chronic obstructive pulmonary disease, unspecified: Secondary | ICD-10-CM | POA: Diagnosis not present

## 2015-03-11 DIAGNOSIS — J309 Allergic rhinitis, unspecified: Secondary | ICD-10-CM | POA: Diagnosis not present

## 2015-03-11 DIAGNOSIS — Z79891 Long term (current) use of opiate analgesic: Secondary | ICD-10-CM | POA: Diagnosis not present

## 2015-03-11 DIAGNOSIS — J449 Chronic obstructive pulmonary disease, unspecified: Secondary | ICD-10-CM | POA: Diagnosis not present

## 2015-03-11 DIAGNOSIS — E119 Type 2 diabetes mellitus without complications: Secondary | ICD-10-CM | POA: Diagnosis not present

## 2015-03-12 ENCOUNTER — Other Ambulatory Visit: Payer: Self-pay

## 2015-03-12 NOTE — Patient Outreach (Signed)
[facility]) Care Management  03/12/2015  [name] September 21, 1960 [id number]  Outreach call attempt #3 for telephonic monthly assessment.  Patient not reached.  Unable to leave voice message. RN CM mailed unsuccessful outreach letter.  RN CM will review for response within next  2 weeks and close case if unable to maintain contact with patient.   Donato Schultzrystal Boniface Goffe, RN, BSN, [facility], CCM  [facility] Care Management Care Management Coordinator [phone] Direct [phone] Cell [phone] Office [phone] Fax

## 2015-03-18 ENCOUNTER — Other Ambulatory Visit: Payer: Self-pay | Admitting: Licensed Clinical Social Worker

## 2015-03-18 NOTE — Patient Outreach (Signed)
Assessment:  CSW called client on 3/14/17and spoke via phone with client. CSW verified client identity. CSW and client spoke of client needs.  Patricia Bass said she had her prescribed medications and is taking her medications as prescribed. She is eating adequately and sleeping adequately. Client and CSW spoke of client care plan. Client agreed to try to attend all scheduled client medical appointments for next 30 days. CSW spoke with client about in home support. Patricia Bass said that [facility] had sent form again to Dr. Luan Pulling to complete related to [place] for client.  Client said [facility] had mailed her a letter recently saying that she (client) did not qualify for in home care support through [facility].  Letter reported that client may not have seen primary doctor in 83 day period and thus client had not met the requirements to qualify for in home care support. CSW encouraged client to complete appeal form for this decision and mail in appeal form to [facility] in next two days. Client said she would complete appeal form and mail it to [facility] in next two days. She said she saw Dr. Luan Pulling recently for a medical appointment. She said she feels that she still needs in home personal care services support. CSW encouraged client to call CSW as needed at [phone] to discuss social work needs of client.  CSW thanked [place] for phone call with CSW on 03/18/15.    Plan: Client to attend all scheduled client medical appointments in next 30 days CSW to call client in three weeks to assess client needs.  Norva Riffle.Remus Hagedorn MSW, LCSW Licensed Clinical Social Worker [facility] Care Management [phone]

## 2015-03-26 ENCOUNTER — Ambulatory Visit: Payer: Self-pay

## 2015-03-27 ENCOUNTER — Other Ambulatory Visit: Payer: Self-pay

## 2015-03-27 NOTE — Patient Outreach (Addendum)
[facility]) Care Management  03/27/2015  Patricia RedwoodCarrie B Bass 28-Nov-1960 [id number]   Outreach call attempt #4 for telephonic monthly assessment.  Patient not reached.   RN CM left HIPAA compliant voice mail message with name and number for call back.  Unsuccessful outreach letter sent on 03/10/2015.   Plan: RN CM notified other [facility] discipline, Patricia BlakesMichael S Forrest, LCSW case closed to Telephonic RN CM services due to unable to maintain contact with patient.    Patricia Schultzrystal Jamichael Knotts, RN, BSN, [facility], CCM  [facility] Care Management Care Management Coordinator [phone] Direct [phone] Cell [phone] Office [phone] Fax

## 2015-04-09 ENCOUNTER — Other Ambulatory Visit: Payer: Self-pay | Admitting: Licensed Clinical Social Worker

## 2015-04-09 NOTE — Patient Outreach (Signed)
Assessment:    CSW called client phone contact number on 04/09/15 and spoke via phone with client. CSW verified client identity. CSW and client spoke of client needs. Client said she had an appointment with Dr Juanetta GoslingHawkins 2 weeks ago.  She said she is eating adequately.  She is sleeping adequately She bathes herself with no problems and she dresses herself with no problems.  She said it is hard for her to pick up items off the floor.  She has pain in her neck.  She takes pain medication as prescribed.  She has reduced family support. CSW again gave client the phone number of [facility] at [phone]. CSW encouraged client to call [facility] to inquire of the status of her personal care services request though Dr.Hawkins, her primary doctor. She said she would call [facility] again to inquire of status of her in home services request. CSW encouraged client to call CSW at [phone] as needed to speak of client social work needs. CSW and client spoke of client care plan. CSW encouraged client to attend all scheduled client medical appointments in the next 30 days. Client was appreciative of phone call from CSW on 04/09/15    Plan:  Client to attend all scheduled client medical appointments in the next 30 days. CSW to call client in 4 weeks to assess client needs at that time.  Kelton PillarMichael S.Wynona Duhamel MSW, LCSW Licensed Clinical Social Worker [facility] Care Management [phone]

## 2015-05-08 ENCOUNTER — Other Ambulatory Visit: Payer: Self-pay | Admitting: Licensed Clinical Social Worker

## 2015-05-08 NOTE — Patient Outreach (Signed)
Assessment:  CSW spoke via phone with client on 05/08/15. CSW verified client identity. CSW and client spoke of client needs. CSW and client spoke of client care plan. CSW encouraged client to attend all scheduled client medical appointments in next 30 days.  Client sees Dr. Juanetta GoslingHawkins as primary care doctor.  She has her prescribed medications and is taking medications as prescribed.  Client is attempting to talk with Laren BoomBecky Robertson, RN at Dr. Juanetta GoslingHawkins regarding pain medication needs of client.  Client will ask Kriste BasqueBecky to share information on client pain medications with Dr. Juanetta GoslingHawkins.  Her transport needs are working well.  She said she is eating adequately well and sleeping well.  CSW encouraged client to call CSW at [phone] as needed to discuss social work needs of client.  CSW thanked client for phone call with CSW on 05/08/15.    Plan: Client to attend all scheduled client medical appointments in next 30 days. CSW to call client  In 4 weeks to assess client needs.   Kelton PillarMichael S.Olivia Pavelko MSW, LCSW Licensed Clinical Social Worker [facility] Care Management [phone]

## 2015-05-26 ENCOUNTER — Other Ambulatory Visit: Payer: Self-pay | Admitting: Licensed Clinical Social Worker

## 2015-05-26 NOTE — Patient Outreach (Signed)
Assessment:  CSW called [facility] on 05/26/15 and spoke with representative of that agency about Personal Care Services application status for client. Representative said that client had not previously seen primary doctor in 90 day period required. Representative said that primary doctor would need to complete an updated Request for Independent Assessment for Personal Care Services form and fax that request along with recent information on client visit in past 90 days to [facility]. CSW thanked representative for this information. CSW then called office of Dr. Juanetta GoslingHawkins on 05/26/15 and left above information via phone message for Laren BoomBecky Robertson, RN at practice.  CSW requested that Kriste BasqueBecky talk with Dr. Juanetta GoslingHawkins about above request. CSW requested that [facility] with Dr. Juanetta GoslingHawkins about completion of needed form and update information to fax to [facility]. Of note, Dr. Juanetta GoslingHawkins has completed this request form for client several times presviously and form has been faxed previously to [facility]. CSW left Laren BoomBecky  Robertson CSW number of [phone] and Baldwin Crownthanked Becky for her assistance regarding this client needs.    Plan:  Laren BoomBecky Robertson, RN at practice of Dr. Juanetta GoslingHawkins, to speak with Dr. Juanetta GoslingHawkins regarding above request by [facility] for completed form on client and recent doctor visit note for client to be faxed again to [facility].   CSW to call client as previously scheduled to assess client needs.  Patricia PillarMichael BassPatricia Bass MSW, LCSW Licensed Clinical Social Worker [facility] Care [facility] [phone]

## 2015-05-29 DIAGNOSIS — Z79891 Long term (current) use of opiate analgesic: Secondary | ICD-10-CM | POA: Diagnosis not present

## 2015-06-06 ENCOUNTER — Other Ambulatory Visit: Payer: Self-pay | Admitting: Licensed Clinical Social Worker

## 2015-06-06 NOTE — Patient Outreach (Signed)
Assessment:  CSW spoke via phone with client on 06/06/15. CSW verified client identity. CSW and Patricia Bass spoke of client needs. Patricia Bass siad she had her prescribed medications and is taking medciations as prescribed. She said she is eating adequately and sleeping adequately. She said she had no transport needs at present.  CSW and client spoke of client care plan. CSW encouraged client to attend all scheduled client medical appointments in next 30 days. Client sees Dr. Juanetta Bass as her primary care doctor.  Client said she spoke with representative of [facility] one month ago.  Client then called Patricia BeneJoyce, RN at Dr. Juanetta Bass' practice and informed Patricia Bass that she could call [facility] to determine what still needed to be completed with form for client Personal Care Services application. Client said she has pain issues. She has talked with Dr. Juanetta Bass about pain issues of client.  Client said she is taking pain medications as prescribed.  Client said she has appointment with Dr. Juanetta Bass on 07/10/15.  CSW thanked client for phone conversation with CSW on 06/06/15.   Plan:  Client to attend all scheduled client medical appointments in next 30 days. CSW to call client in 4 weeks to assess needs of client at that time.  Patricia Bass.Patricia Bass MSW, LCSW Licensed Clinical Social Worker [facility] Care [facility] [phone]

## 2015-07-04 ENCOUNTER — Other Ambulatory Visit: Payer: Self-pay | Admitting: Licensed Clinical Social Worker

## 2015-07-04 NOTE — Patient Outreach (Signed)
Assessment:  CSW spoke via phone with client on 07/04/15. CSW verified identity of client. CSW and client spoke of client needs. Client said she has her prescribed medications and is taking medications as prescribed.  Client is eating adequately and sleeping adequately. Client sees Dr. Juanetta GoslingHawkins as her primary care doctor. Client has appointment with Dr.Hawkins on 07/10/15.Client did not mention any transport needs at present. CSW and client spoke of client care plan. CSW encouraged client to attend all scheduled client medical appointments in the next 30 days.  Client said that she now has begun to receive in home aide support arranged with [facility]. Client said she receives home health aide support for 2 hours per day on Mondays through Fridays of each week.  Client said that this help she receives from the home health aide is very beneficial to client.   Client said she has pain issues in her back and legs. She said she is talking with Dr. Juanetta GoslingHawkins about her pain symptoms faced. CSW encouraged client to speak more with Dr. Juanetta GoslingHawkins about client pain issues at client's next appointment with Dr. Juanetta GoslingHawkins. Client said she also hopes to contact local gym to learn more about low impact exercise programs available at that gym.  CSW congratulated Staplesarrie on her persistence in working with her primary doctor and [facility] to arrange in home aide support for client. CSW thanked Hoffmanarrie for phone call with CSW on 07/04/15. CSW encouraged Lyla SonCarrie to call CSW at [phone] as needed to discuss social work needs of client.   Plan:  Client to attend all scheduled client medical appointments in the next 30 days. CSW to call client in 3 weeks to assess needs of client at that time.  Kelton PillarMichael S.Tineshia Becraft MSW, LCSW Licensed Clinical Social Worker [facility] Care [facility] [phone]

## 2015-07-22 ENCOUNTER — Encounter: Payer: Self-pay | Admitting: Licensed Clinical Social Worker

## 2015-07-22 ENCOUNTER — Other Ambulatory Visit: Payer: Self-pay | Admitting: Licensed Clinical Social Worker

## 2015-07-22 NOTE — Patient Outreach (Signed)
Assesment:  CSW spoke via phone with client on 07/22/15. CSW verified client identity. CSW and client spoke of client needs. Client said she has prescribed medications and is taking medications as prescribed. She said she is eating adequately and sleeping adequately. She said she is trying to attend all scheduled medical appointments for client. She sees Dr. Luan Pulling as primary care doctor. CSW and client spoke of client care plan. CSW encouraged client to attend all scheduled client medical appointments in the next 30 days. Client said she now receives in home aide support with [facility]. She said home health aide helps her in the home for two hours each day on Mondays through Fridays each week. She said home health aide is very helpful to her at present. She did not mention any transportation issues at present. CSW informed client that client had met her care plan goals with CSW services. CSW congratulated Belva on communicating with her doctor and [facility] to ensure she had in home aide support needed by client. CSW informed Elanor that [facility] would discharge client from [facility] on 07/22/15 since client had met her care plan goals with [facility] CSW services. Deeanna agreed to this plan. Reiko was very appreciative of support she had received from [place] in recent months. CSW thanked [place] for working with CSW and working with [facility] program support.   Plan:  CSW is discharging CHUDNEY SCHEFFLER from [facility] CSW services on 07/22/15 since client has met her care plan goals with [facility] CSW services CSW to inform [place] that [place] discharged client from [place] services on 07/22/15. CSW to fax physician case closure letter to Dr. Luan Pulling informing Dr. Luan Pulling that [place] discharged client on 07/22/15 from [facility] CSW services.  Norva Riffle.Janique Hoefer MSW, LCSW Licensed Clinical Social Worker [facility] Care Management [phone].

## 2015-08-05 DIAGNOSIS — M797 Fibromyalgia: Secondary | ICD-10-CM | POA: Diagnosis not present

## 2015-08-05 DIAGNOSIS — M542 Cervicalgia: Secondary | ICD-10-CM | POA: Diagnosis not present

## 2015-08-05 DIAGNOSIS — J449 Chronic obstructive pulmonary disease, unspecified: Secondary | ICD-10-CM | POA: Diagnosis not present

## 2015-08-05 DIAGNOSIS — E119 Type 2 diabetes mellitus without complications: Secondary | ICD-10-CM | POA: Diagnosis not present

## 2015-08-20 DIAGNOSIS — M542 Cervicalgia: Secondary | ICD-10-CM | POA: Diagnosis not present

## 2015-08-20 DIAGNOSIS — E119 Type 2 diabetes mellitus without complications: Secondary | ICD-10-CM | POA: Diagnosis not present

## 2015-08-20 DIAGNOSIS — M797 Fibromyalgia: Secondary | ICD-10-CM | POA: Diagnosis not present

## 2015-08-20 DIAGNOSIS — J449 Chronic obstructive pulmonary disease, unspecified: Secondary | ICD-10-CM | POA: Diagnosis not present

## 2015-08-25 ENCOUNTER — Other Ambulatory Visit (HOSPITAL_COMMUNITY): Payer: Self-pay | Admitting: Pulmonary Disease

## 2015-08-25 ENCOUNTER — Ambulatory Visit (HOSPITAL_COMMUNITY)
Admission: RE | Admit: 2015-08-25 | Discharge: 2015-08-25 | Disposition: A | Discharge status: Home / Self Care | Payer: Medicare Other | Source: Ambulatory Visit | Attending: Pulmonary Disease | Admitting: Pulmonary Disease

## 2015-08-25 DIAGNOSIS — M47892 Other spondylosis, cervical region: Secondary | ICD-10-CM | POA: Diagnosis not present

## 2015-08-25 DIAGNOSIS — M25511 Pain in right shoulder: Secondary | ICD-10-CM

## 2015-08-25 DIAGNOSIS — M19012 Primary osteoarthritis, left shoulder: Secondary | ICD-10-CM | POA: Insufficient documentation

## 2015-08-25 DIAGNOSIS — M47812 Spondylosis without myelopathy or radiculopathy, cervical region: Secondary | ICD-10-CM | POA: Diagnosis not present

## 2015-08-25 DIAGNOSIS — M25512 Pain in left shoulder: Secondary | ICD-10-CM

## 2015-08-25 DIAGNOSIS — M542 Cervicalgia: Secondary | ICD-10-CM | POA: Diagnosis not present

## 2015-09-01 ENCOUNTER — Other Ambulatory Visit (HOSPITAL_COMMUNITY): Payer: Self-pay | Admitting: Pulmonary Disease

## 2015-09-01 DIAGNOSIS — M25512 Pain in left shoulder: Secondary | ICD-10-CM

## 2015-09-01 DIAGNOSIS — M542 Cervicalgia: Secondary | ICD-10-CM

## 2015-09-01 DIAGNOSIS — M545 Low back pain: Secondary | ICD-10-CM

## 2015-09-09 ENCOUNTER — Ambulatory Visit (HOSPITAL_COMMUNITY)
Admission: RE | Admit: 2015-09-09 | Discharge: 2015-09-09 | Disposition: A | Discharge status: Home / Self Care | Payer: Medicare Other | Source: Ambulatory Visit | Attending: Pulmonary Disease | Admitting: Pulmonary Disease

## 2015-09-09 DIAGNOSIS — X58XXXA Exposure to other specified factors, initial encounter: Secondary | ICD-10-CM | POA: Insufficient documentation

## 2015-09-09 DIAGNOSIS — M5033 Other cervical disc degeneration, cervicothoracic region: Secondary | ICD-10-CM | POA: Diagnosis not present

## 2015-09-09 DIAGNOSIS — T148 Other injury of unspecified body region: Secondary | ICD-10-CM | POA: Diagnosis not present

## 2015-09-09 DIAGNOSIS — M4802 Spinal stenosis, cervical region: Secondary | ICD-10-CM | POA: Insufficient documentation

## 2015-09-09 DIAGNOSIS — M545 Low back pain: Secondary | ICD-10-CM

## 2015-09-09 DIAGNOSIS — M542 Cervicalgia: Secondary | ICD-10-CM

## 2015-09-09 DIAGNOSIS — M25512 Pain in left shoulder: Secondary | ICD-10-CM

## 2015-09-16 DIAGNOSIS — Z79891 Long term (current) use of opiate analgesic: Secondary | ICD-10-CM | POA: Diagnosis not present

## 2016-02-05 DIAGNOSIS — M545 Low back pain: Secondary | ICD-10-CM | POA: Diagnosis not present

## 2016-02-05 DIAGNOSIS — G4733 Obstructive sleep apnea (adult) (pediatric): Secondary | ICD-10-CM | POA: Diagnosis not present

## 2016-02-05 DIAGNOSIS — J449 Chronic obstructive pulmonary disease, unspecified: Secondary | ICD-10-CM | POA: Diagnosis not present

## 2016-02-05 DIAGNOSIS — E119 Type 2 diabetes mellitus without complications: Secondary | ICD-10-CM | POA: Diagnosis not present

## 2016-02-11 ENCOUNTER — Other Ambulatory Visit (HOSPITAL_BASED_OUTPATIENT_CLINIC_OR_DEPARTMENT_OTHER): Payer: Self-pay

## 2016-02-11 DIAGNOSIS — G4730 Sleep apnea, unspecified: Secondary | ICD-10-CM

## 2016-02-25 ENCOUNTER — Ambulatory Visit: Payer: Medicare Other | Attending: Pulmonary Disease | Admitting: Neurology

## 2016-02-25 DIAGNOSIS — G4730 Sleep apnea, unspecified: Secondary | ICD-10-CM

## 2016-02-25 DIAGNOSIS — G4733 Obstructive sleep apnea (adult) (pediatric): Secondary | ICD-10-CM | POA: Insufficient documentation

## 2016-02-27 ENCOUNTER — Ambulatory Visit (HOSPITAL_COMMUNITY)
Admission: RE | Admit: 2016-02-27 | Discharge: 2016-02-27 | Disposition: A | Discharge status: Home / Self Care | Payer: Medicare Other | Source: Ambulatory Visit | Attending: Pulmonary Disease | Admitting: Pulmonary Disease

## 2016-02-27 ENCOUNTER — Other Ambulatory Visit (HOSPITAL_COMMUNITY): Payer: Self-pay | Admitting: Pulmonary Disease

## 2016-02-27 DIAGNOSIS — R52 Pain, unspecified: Secondary | ICD-10-CM

## 2016-02-27 DIAGNOSIS — M419 Scoliosis, unspecified: Secondary | ICD-10-CM | POA: Insufficient documentation

## 2016-02-27 DIAGNOSIS — M545 Low back pain: Secondary | ICD-10-CM | POA: Diagnosis not present

## 2016-02-27 DIAGNOSIS — M25551 Pain in right hip: Secondary | ICD-10-CM | POA: Diagnosis not present

## 2016-02-27 DIAGNOSIS — I700 Atherosclerosis of aorta: Secondary | ICD-10-CM | POA: Diagnosis not present

## 2016-02-27 DIAGNOSIS — M47896 Other spondylosis, lumbar region: Secondary | ICD-10-CM | POA: Diagnosis not present

## 2016-02-28 NOTE — Procedures (Signed)
[facility] Dazha Kempa A. Gerilyn Pilgrim, MD     [email or web address]             NOCTURNAL POLYSOMNOGRAPHY   LOCATION: [facility]   Patient Name: Patricia, Bass Date: 02/25/2016 Gender: Female D.O.B: 1960-03-02 Age (years): 55 Referring Provider: Mikey Bussing Height (inches): 60 Interpreting Physician: Beryle Beams MD, ABSM Weight (lbs): 180 RPSGT: Peak, Robert BMI: 35 MRN: [id number] Neck Size: 16.00 CLINICAL INFORMATION Sleep Study Type: NPSG  Indication for sleep study: N/A  Epworth Sleepiness Score: 13  SLEEP STUDY TECHNIQUE As per the AASM Manual for the Scoring of Sleep and Associated Events v2.3 (April 2016) with a hypopnea requiring 4% desaturations.  The channels recorded and monitored were frontal, central and occipital EEG, electrooculogram (EOG), submentalis EMG (chin), nasal and oral airflow, thoracic and abdominal wall motion, anterior tibialis EMG, snore microphone, electrocardiogram, and pulse oximetry.  MEDICATIONS Medications self-administered by patient taken the night of the study : N/A  Current Outpatient Prescriptions:  .  albuterol (PROVENTIL HFA;VENTOLIN HFA) 108 (90 BASE) MCG/ACT inhaler, Inhale 2 puffs into the lungs every 6 (six) hours as needed. Shortness of breath, Disp: , Rfl:  .  cetirizine (ZYRTEC) 10 MG tablet, Take 10 mg by mouth daily., Disp: , Rfl:  .  doxepin (SINEQUAN) 25 MG capsule, Take 50 mg by mouth at bedtime. Reported on 02/05/2015, Disp: , Rfl:  .  HYDROcodone-acetaminophen (NORCO) 10-325 MG per tablet, Take 1 tablet by mouth every 6 (six) hours as needed for moderate pain., Disp: , Rfl:  .  ibuprofen (ADVIL,MOTRIN) 800 MG tablet, Take 800 mg by mouth 3 (three) times daily., Disp: , Rfl:  .  levothyroxine (SYNTHROID, LEVOTHROID) 75 MCG tablet, Take 1 tablet (75 mcg total) by mouth daily before breakfast., Disp: 30 tablet, Rfl: 0 .  metFORMIN (GLUCOPHAGE) 500 MG tablet, Take 1 tablet (500 mg total) by mouth 2 (two) times  daily with a meal., Disp: 60 tablet, Rfl: 0 .  nabumetone (RELAFEN) 750 MG tablet, Take 750 mg by mouth daily. Reported on 02/05/2015, Disp: , Rfl:  .  naphazoline-glycerin (CLEAR EYES) 0.012-0.2 % SOLN, Place 1-2 drops into both eyes every 4 (four) hours as needed. Dry Eyes, Disp: , Rfl:  .  omeprazole (PRILOSEC) 40 MG capsule, Take 1 capsule (40 mg total) by mouth daily., Disp: 30 capsule, Rfl: 0 .  pravastatin (PRAVACHOL) 20 MG tablet, Take 1 tablet (20 mg total) by mouth daily., Disp: 30 tablet, Rfl: 0 .  pregabalin (LYRICA) 300 MG capsule, Take 1 capsule (300 mg total) by mouth 2 (two) times daily., Disp: 60 capsule, Rfl: 0 .  rOPINIRole (REQUIP) 0.5 MG tablet, Take 1 tablet (0.5 mg total) by mouth 3 (three) times daily., Disp: 90 tablet, Rfl: 0 .  traZODone (DESYREL) 50 MG tablet, Take 1 tablet (50 mg total) by mouth at bedtime., Disp: 30 tablet, Rfl: 0 .  valACYclovir (VALTREX) 500 MG tablet, Take 500 mg by mouth 2 (two) times daily., Disp: , Rfl:    SLEEP ARCHITECTURE The study was initiated at 10:33:00 PM and ended at 4:41:49 AM.  Sleep onset time was 38.3 minutes and the sleep efficiency was 77.0%. The total sleep time was 284.0 minutes.  Stage REM latency was 156.5 minutes.  The patient spent 9.33% of the night in stage N1 sleep, 77.12% in stage N2 sleep, 0.00% in stage N3 and 13.55% in REM.  Alpha intrusion was absent.  Supine sleep was 2.60%.  RESPIRATORY PARAMETERS The overall apnea/hypopnea index (AHI) was 10  per hour. There were 2 total apneas, including 2 obstructive, 0 central and 0 mixed apneas. There were 38 hypopneas and 3 RERAs.  The AHI during Stage REM sleep was 48.3 per hour.  AHI while supine was 24.3 per hour.  The mean oxygen saturation was 81.70%. The minimum SpO2 during sleep was 67.00%.  Soft snoring was noted during this study.  CARDIAC DATA The 2 lead EKG demonstrated sinus rhythm. The mean heart rate was 96.86 beats per minute. Other EKG findings  include: None. LEG MOVEMENT DATA The total PLMS were 33 with a resulting PLMS index of 6.97. Associated arousal with leg movement index was 0.2.  IMPRESSIONS - Moderate obstructive sleep apnea worse during in REM sleep. A trial of autoPAP 9- 14 is suggested. - Mild periodic limb movements of sleep occurred during the study. No significant associated arousals.  Argie RammingKofi A Adie Vilar, MD Diplomate, American Board of Sleep Medicine.

## 2016-05-03 DIAGNOSIS — M5001 Cervical disc disorder with myelopathy,  high cervical region: Secondary | ICD-10-CM | POA: Diagnosis not present

## 2016-05-03 DIAGNOSIS — M19071 Primary osteoarthritis, right ankle and foot: Secondary | ICD-10-CM | POA: Diagnosis not present

## 2016-05-03 DIAGNOSIS — M170 Bilateral primary osteoarthritis of knee: Secondary | ICD-10-CM | POA: Diagnosis not present

## 2016-05-03 DIAGNOSIS — M19072 Primary osteoarthritis, left ankle and foot: Secondary | ICD-10-CM | POA: Diagnosis not present

## 2016-05-04 DIAGNOSIS — M545 Low back pain: Secondary | ICD-10-CM | POA: Diagnosis not present

## 2016-05-04 DIAGNOSIS — J309 Allergic rhinitis, unspecified: Secondary | ICD-10-CM | POA: Diagnosis not present

## 2016-05-04 DIAGNOSIS — J449 Chronic obstructive pulmonary disease, unspecified: Secondary | ICD-10-CM | POA: Diagnosis not present

## 2016-05-04 DIAGNOSIS — G4733 Obstructive sleep apnea (adult) (pediatric): Secondary | ICD-10-CM | POA: Diagnosis not present

## 2016-06-20 ENCOUNTER — Encounter (HOSPITAL_COMMUNITY): Payer: Self-pay | Admitting: Emergency Medicine

## 2016-06-20 ENCOUNTER — Emergency Department (HOSPITAL_COMMUNITY): Payer: Medicare Other

## 2016-06-20 ENCOUNTER — Inpatient Hospital Stay (HOSPITAL_COMMUNITY)
Admission: EM | Admit: 2016-06-20 | Discharge: 2016-06-22 | DRG: 193 | Disposition: A | Discharge status: Home / Self Care | Payer: Medicare Other | Attending: Pulmonary Disease | Admitting: Pulmonary Disease

## 2016-06-20 DIAGNOSIS — E119 Type 2 diabetes mellitus without complications: Secondary | ICD-10-CM | POA: Diagnosis not present

## 2016-06-20 DIAGNOSIS — E039 Hypothyroidism, unspecified: Secondary | ICD-10-CM | POA: Diagnosis present

## 2016-06-20 DIAGNOSIS — J9620 Acute and chronic respiratory failure, unspecified whether with hypoxia or hypercapnia: Secondary | ICD-10-CM | POA: Diagnosis present

## 2016-06-20 DIAGNOSIS — J189 Pneumonia, unspecified organism: Principal | ICD-10-CM | POA: Diagnosis present

## 2016-06-20 DIAGNOSIS — Z792 Long term (current) use of antibiotics: Secondary | ICD-10-CM | POA: Diagnosis not present

## 2016-06-20 DIAGNOSIS — Z7984 Long term (current) use of oral hypoglycemic drugs: Secondary | ICD-10-CM | POA: Diagnosis not present

## 2016-06-20 DIAGNOSIS — Z79899 Other long term (current) drug therapy: Secondary | ICD-10-CM

## 2016-06-20 DIAGNOSIS — E876 Hypokalemia: Secondary | ICD-10-CM | POA: Diagnosis present

## 2016-06-20 DIAGNOSIS — J441 Chronic obstructive pulmonary disease with (acute) exacerbation: Secondary | ICD-10-CM | POA: Diagnosis not present

## 2016-06-20 DIAGNOSIS — K219 Gastro-esophageal reflux disease without esophagitis: Secondary | ICD-10-CM | POA: Diagnosis not present

## 2016-06-20 DIAGNOSIS — J449 Chronic obstructive pulmonary disease, unspecified: Secondary | ICD-10-CM | POA: Diagnosis present

## 2016-06-20 DIAGNOSIS — F419 Anxiety disorder, unspecified: Secondary | ICD-10-CM | POA: Diagnosis present

## 2016-06-20 DIAGNOSIS — J9601 Acute respiratory failure with hypoxia: Secondary | ICD-10-CM | POA: Diagnosis not present

## 2016-06-20 DIAGNOSIS — E1165 Type 2 diabetes mellitus with hyperglycemia: Secondary | ICD-10-CM

## 2016-06-20 DIAGNOSIS — E139 Other specified diabetes mellitus without complications: Secondary | ICD-10-CM | POA: Diagnosis not present

## 2016-06-20 DIAGNOSIS — J9621 Acute and chronic respiratory failure with hypoxia: Secondary | ICD-10-CM | POA: Diagnosis present

## 2016-06-20 DIAGNOSIS — R05 Cough: Secondary | ICD-10-CM | POA: Diagnosis not present

## 2016-06-20 HISTORY — DX: Polyneuropathy, unspecified: G62.9

## 2016-06-20 LAB — CBC WITH DIFFERENTIAL/PLATELET
Basophils Absolute: 0 10*3/uL (ref 0.0–0.1)
Basophils Relative: 0 %
Eosinophils Absolute: 0 10*3/uL (ref 0.0–0.7)
Eosinophils Relative: 0 %
HCT: 32 % — ABNORMAL LOW (ref 36.0–46.0)
Hemoglobin: 9.7 g/dL — ABNORMAL LOW (ref 12.0–15.0)
Lymphocytes Relative: 11 %
Lymphs Abs: 1.2 10*3/uL (ref 0.7–4.0)
MCH: 25.1 pg — ABNORMAL LOW (ref 26.0–34.0)
MCHC: 30.3 g/dL (ref 30.0–36.0)
MCV: 82.9 fL (ref 78.0–100.0)
Monocytes Absolute: 0.8 10*3/uL (ref 0.1–1.0)
Monocytes Relative: 7 %
Neutro Abs: 9.2 10*3/uL — ABNORMAL HIGH (ref 1.7–7.7)
Neutrophils Relative %: 82 %
Platelets: 263 10*3/uL (ref 150–400)
RBC: 3.86 MIL/uL — ABNORMAL LOW (ref 3.87–5.11)
RDW: 21.7 % — ABNORMAL HIGH (ref 11.5–15.5)
WBC: 11.3 10*3/uL — ABNORMAL HIGH (ref 4.0–10.5)

## 2016-06-20 LAB — BASIC METABOLIC PANEL
Anion gap: 9 (ref 5–15)
BUN: 11 mg/dL (ref 6–20)
CO2: 28 mmol/L (ref 22–32)
Calcium: 8.7 mg/dL — ABNORMAL LOW (ref 8.9–10.3)
Chloride: 100 mmol/L — ABNORMAL LOW (ref 101–111)
Creatinine, Ser: 0.75 mg/dL (ref 0.44–1.00)
GFR calc Af Amer: >60 mL/min (ref >60)
GFR calc non Af Amer: >60 mL/min (ref >60)
Glucose, Bld: 181 mg/dL — ABNORMAL HIGH (ref 65–99)
Potassium: 2.7 mmol/L — CL (ref 3.5–5.1)
Sodium: 137 mmol/L (ref 135–145)

## 2016-06-20 LAB — MAGNESIUM: Magnesium: 1.7 mg/dL (ref 1.7–2.4)

## 2016-06-20 LAB — LACTIC ACID, PLASMA: Lactic Acid, Venous: 1.6 mmol/L (ref 0.5–1.9)

## 2016-06-20 MED ORDER — ALBUTEROL SULFATE (2.5 MG/3ML) 0.083% IN NEBU
5.0000 mg | INHALATION_SOLUTION | Freq: Once | RESPIRATORY_TRACT | Status: AC
  Administered 2016-06-20: 5 mg via RESPIRATORY_TRACT
  Filled 2016-06-20: qty 6

## 2016-06-20 MED ORDER — POTASSIUM CHLORIDE CRYS ER 20 MEQ PO TBCR
60.0000 meq | EXTENDED_RELEASE_TABLET | Freq: Once | ORAL | Status: AC
  Administered 2016-06-21: 60 meq via ORAL
  Filled 2016-06-20: qty 3

## 2016-06-20 MED ORDER — DEXTROSE 5 % IV SOLN
1.0000 g | Freq: Once | INTRAVENOUS | Status: AC
  Administered 2016-06-20: 1 g via INTRAVENOUS
  Filled 2016-06-20: qty 10

## 2016-06-20 MED ORDER — IBUPROFEN 400 MG PO TABS
600.0000 mg | ORAL_TABLET | Freq: Once | ORAL | Status: AC
  Administered 2016-06-20: 600 mg via ORAL
  Filled 2016-06-20: qty 2

## 2016-06-20 MED ORDER — ACETAMINOPHEN 500 MG PO TABS
1000.0000 mg | ORAL_TABLET | Freq: Once | ORAL | Status: AC
  Administered 2016-06-20: 1000 mg via ORAL
  Filled 2016-06-20: qty 2

## 2016-06-20 MED ORDER — POTASSIUM CHLORIDE 10 MEQ/100ML IV SOLN
10.0000 meq | Freq: Once | INTRAVENOUS | Status: AC
  Administered 2016-06-20: 10 meq via INTRAVENOUS
  Filled 2016-06-20: qty 100

## 2016-06-20 MED ORDER — SODIUM CHLORIDE 0.9 % IV BOLUS (SEPSIS)
1000.0000 mL | Freq: Once | INTRAVENOUS | Status: AC
  Administered 2016-06-20: 1000 mL via INTRAVENOUS

## 2016-06-20 MED ORDER — DEXTROSE 5 % IV SOLN: 500.0000 mg | INTRAVENOUS | Status: DC

## 2016-06-20 MED ORDER — MORPHINE SULFATE (PF) 4 MG/ML IV SOLN
4.0000 mg | Freq: Once | INTRAVENOUS | Status: AC
  Administered 2016-06-20: 4 mg via INTRAVENOUS
  Filled 2016-06-20: qty 1

## 2016-06-20 NOTE — ED Notes (Signed)
Here with another pt 

## 2016-06-20 NOTE — ED Notes (Signed)
Pt O2 sat dropped to 79% on RA, denies home O2 use. Placed on 3L [place] and rose to 91%. Hx of COPD.

## 2016-06-20 NOTE — ED Triage Notes (Signed)
Cough and fever x past couple of days

## 2016-06-20 NOTE — ED Provider Notes (Signed)
[facility]-EM[facility]Y DEPT Provider Note   CSN: [id number] Arrival date & time: 06/20/16  2113  By signing my name below, I, Patricia Bass, attest that this documentation has been prepared under the direction and in the presence of Raeford Razor, MD. Electronically Signed: Rosario Bass, ED Scribe. 06/20/16. 10:09 PM.  History   Chief Complaint Chief Complaint  Patient presents with  . Fever   The history is provided by the patient. No language interpreter was used.    HPI Comments: Patricia Bass is a 56 y.o. female with a PMHx of COPD, DM, CAD, who presents to the Emergency Department complaining of persistent, worsening, productive cough productive of sputum beginning two days ago. She reports associated fever (febrile in the ED at 102.6), mild b/l ankle swelling, and chest pain 2/t coughing only. She has been taking Ibuprofen at home with only temporary relief of her fever at home. No sick contacts with similar symptoms. She is not currently on home oxygen for her h/o COPD. She denies nausea, vomiting, urgency, frequency, hematuria, dysuria, difficulty urinating, or any other associated symptoms.   PCP: [name], MD  Past Medical History:  Diagnosis Date  . Anxiety   . Bulging disc   . Carpal tunnel syndrome   . Cervicalgia   . Chronic airway obstruction (HCC)   . COPD (chronic obstructive pulmonary disease) (HCC)   . Coronary artery disease   . Diabetes mellitus (HCC) 01/11/2012  . GERD (gastroesophageal reflux disease)   . Headache(784.0)   . Hypothyroid   . Lumbago   . Migraine   . Myalgia and myositis, unspecified   . Neuropathy   . Pneumonia   . Restless leg syndrome   . Shortness of breath   . Sleep apnea    Patient Active Problem List   Diagnosis Date Noted  . Carpal tunnel syndrome   . Migraine   . Hypothyroid   . Myalgia and myositis, unspecified   . Lumbago   . Chronic airway obstruction (HCC)   . Cervicalgia   . Anxiety   . Diabetes  mellitus (HCC) 01/11/2012  . S/P cubital tunnel release 04/14/2011  . Ulnar nerve compression 04/14/2011  . Pain in joint, forearm 04/14/2011   Past Surgical History:  Procedure Laterality Date  . BLADDER SUSPENSION    . CARDIAC CATHETERIZATION    . CHOLECYSTECTOMY    . OOPHORECTOMY    . TUBAL LIGATION    . ULNAR NERVE TRANSPOSITION  04/01/2011   Procedure: ULNAR NERVE DECOMPRESSION/TRANSPOSITION;  Surgeon: Dominica Severin, MD;  Location[facility] OR;  Service: Orthopedics;  Laterality: Left;  ULNAR NERVE RELEASE WITH ANTERIOR TRANS POSITION REPAIR RECONSTRUCTION AS NECESSARY   OB History    No data available     Home Medications    Prior to Admission medications   Medication Sig Start Date End Date Taking? Authorizing Provider  albuterol (PROVENTIL HFA;VENTOLIN HFA) 108 (90 BASE) MCG/ACT inhaler Inhale 2 puffs into the lungs every 6 (six) hours as needed. Shortness of breath    [provider]  cetirizine (ZYRTEC) 10 MG tablet Take 10 mg by mouth daily. 08/01/12   [name], MD  doxepin (SINEQUAN) 25 MG capsule Take 50 mg by mouth at bedtime. Reported on 02/05/2015    [provider]  HYDROcodone-acetaminophen (NORCO) 10-325 MG per tablet Take 1 tablet by mouth every 6 (six) hours as needed for moderate pain.    [provider]  ibuprofen (ADVIL,MOTRIN) 800 MG tablet Take 800 mg  by mouth 3 (three) times daily.    [provider]  levothyroxine (SYNTHROID, LEVOTHROID) 75 MCG tablet Take 1 tablet (75 mcg total) by mouth daily before breakfast. 09/05/14   Benjiman CorePickering, Nathan, MD  metFORMIN (GLUCOPHAGE) 500 MG tablet Take 1 tablet (500 mg total) by mouth 2 (two) times daily with a meal. 09/05/14   Benjiman CorePickering, Nathan, MD  nabumetone (RELAFEN) 750 MG tablet Take 750 mg by mouth daily. Reported on 02/05/2015    [provider]  naphazoline-glycerin (CLEAR EYES) 0.012-0.2 % SOLN Place 1-2 drops into both eyes every 4 (four) hours as needed. Dry Eyes    [provider]  omeprazole (PRILOSEC) 40 MG capsule Take 1 capsule (40 mg total) by mouth daily. 09/05/14   Benjiman CorePickering, Nathan, MD  pravastatin (PRAVACHOL) 20 MG tablet Take 1 tablet (20 mg total) by mouth daily. 09/05/14   Benjiman CorePickering, Nathan, MD  pregabalin (LYRICA) 300 MG capsule Take 1 capsule (300 mg total) by mouth 2 (two) times daily. 09/05/14   Benjiman CorePickering, Nathan, MD  rOPINIRole (REQUIP) 0.5 MG tablet Take 1 tablet (0.5 mg total) by mouth 3 (three) times daily. 09/05/14   Benjiman CorePickering, Nathan, MD  traZODone (DESYREL) 50 MG tablet Take 1 tablet (50 mg total) by mouth at bedtime. 09/05/14   Benjiman CorePickering, Nathan, MD  valACYclovir (VALTREX) 500 MG tablet Take 500 mg by mouth 2 (two) times daily. 02/14/12   Kari BaarsHawkins, Edward, MD   Family History No family history on file.  Social History Social History  Substance Use Topics  . Smoking status: Current Every Day Smoker    Packs/day: 2.00    Years: 20.00    Types: Cigarettes, E-cigarettes  . Smokeless tobacco: Never Used  . Alcohol use No   Allergies   Duragesic disc transdermal system [fentanyl]  Review of Systems Review of Systems  Constitutional: Positive for fever.  Respiratory: Positive for cough.   Cardiovascular: Positive for chest pain (2/t cough) and leg swelling.  Genitourinary: Negative for difficulty urinating, dysuria, frequency, hematuria and urgency.  All other systems reviewed and are negative.  Physical Exam Updated Vital Signs BP 117/65   Pulse (!) 123   Temp (!) 102.6 F (39.2 C) (Oral)   Resp (!) 23   Ht 5' (1.524 m)   Wt 180 lb (81.6 kg)   SpO2 91%   BMI 35.15 kg/m   Physical Exam  Constitutional: She appears well-developed and well-nourished.  HENT:  Head: Normocephalic.  Right Ear: External ear normal.  Left Ear: External ear normal.  Nose: Nose normal.  Mouth/Throat: Oropharynx is clear and moist.  Eyes: Conjunctivae are normal. Right eye exhibits no discharge. Left eye exhibits no discharge.  Neck: Normal range  of motion.  Cardiovascular: Regular rhythm and normal heart sounds.  Tachycardia present.   No murmur heard. Pulmonary/Chest: Effort normal. No respiratory distress. She has wheezes. She has no rales.  Faint expiratory wheezing bilaterally. Rhonchi heard over the left base.   Abdominal: Soft. She exhibits no distension. There is no tenderness. There is no rebound and no guarding.  Musculoskeletal: Normal range of motion. She exhibits no edema or tenderness.  Neurological: She is alert. No cranial nerve deficit. Coordination normal.  Skin: Skin is warm. No rash noted. She is diaphoretic. No erythema. No pallor.  Psychiatric: She has a normal mood and affect. Her behavior is normal.  Nursing note and vitals reviewed.  ED Treatments / Results  DIAGNOSTIC STUDIES: Oxygen Saturation is 91% on 2L via [place], low by  my interpretation.   COORDINATION OF CARE: 10:06 PM-Discussed next steps with pt. Pt verbalized understanding and is agreeable with the plan.   Labs (all labs ordered are listed, but only abnormal results are displayed) Labs Reviewed  CBC WITH DIFFERENTIAL/PLATELET - Abnormal; Notable for the following:       Result Value   WBC 11.3 (*)    RBC 3.86 (*)    Hemoglobin 9.7 (*)    HCT 32.0 (*)    MCH 25.1 (*)    RDW 21.7 (*)    Neutro Abs 9.2 (*)    All other components within normal limits  BASIC METABOLIC PANEL - Abnormal; Notable for the following:    Potassium 2.7 (*)    Chloride 100 (*)    Glucose, Bld 181 (*)    Calcium 8.7 (*)    All other components within normal limits  BASIC METABOLIC PANEL - Abnormal; Notable for the following:    Potassium 2.8 (*)    Glucose, Bld 153 (*)    Calcium 8.2 (*)    All other components within normal limits  CBC WITH DIFFERENTIAL/PLATELET - Abnormal; Notable for the following:    RBC 3.29 (*)    Hemoglobin 8.2 (*)    HCT 28.2 (*)    MCH 24.9 (*)    MCHC 29.1 (*)    RDW 21.6 (*)    All other components within normal limits    GLUCOSE, CAPILLARY - Abnormal; Notable for the following:    Glucose-Capillary 131 (*)    All other components within normal limits  GLUCOSE, CAPILLARY - Abnormal; Notable for the following:    Glucose-Capillary 119 (*)    All other components within normal limits  GLUCOSE, CAPILLARY - Abnormal; Notable for the following:    Glucose-Capillary 170 (*)    All other components within normal limits  GLUCOSE, CAPILLARY - Abnormal; Notable for the following:    Glucose-Capillary 160 (*)    All other components within normal limits  CBC WITH DIFFERENTIAL/PLATELET - Abnormal; Notable for the following:    RBC 3.60 (*)    Hemoglobin 9.0 (*)    HCT 30.1 (*)    MCH 25.0 (*)    MCHC 29.9 (*)    RDW 21.4 (*)    All other components within normal limits  BASIC METABOLIC PANEL - Abnormal; Notable for the following:    Glucose, Bld 325 (*)    Calcium 8.8 (*)    All other components within normal limits  IRON AND TIBC - Abnormal; Notable for the following:    Iron 9 (*)    Saturation Ratios 3 (*)    All other components within normal limits  GLUCOSE, CAPILLARY - Abnormal; Notable for the following:    Glucose-Capillary 226 (*)    All other components within normal limits  CULTURE, BLOOD (ROUTINE X 2)  CULTURE, BLOOD (ROUTINE X 2)  CULTURE, EXPECTORATED SPUTUM-ASSESSMENT  GRAM STAIN  LACTIC ACID, PLASMA  MAGNESIUM  GLUCOSE, CAPILLARY    EKG  EKG Interpretation None      Radiology No results found.   Dg Chest 2 View  Result Date: 06/20/2016 CLINICAL DATA:  56 year old female with fever and cough. EXAM: CHEST  2 VIEW COMPARISON:  Radiograph dated 01/24/2012 FINDINGS: Confluent area of airspace density in the right mid to lower lung field noted concerning for developing pneumonia, likely in the right middle lobe. There is no pleural effusion or pneumothorax. Stable top-normal cardiac size. No acute osseous pathology. IMPRESSION:  Findings concerning for developing right middle lobe  pneumonia. Clinical correlation and follow-up recommended. Electronically Signed   By: Elgie Collard M.D.   On: 06/20/2016 23:23   Procedures Procedures   Medications Ordered in ED Medications - No data to display  Initial Impression / Assessment and Plan / ED Course  I have reviewed the triage vital signs and the nursing notes.  Pertinent labs & imaging results that were available during my care of the patient were reviewed by me and considered in my medical decision making (see chart for details).     56yF with CAP. New oxygen requirement. Will admit.   Final Clinical Impressions(s) / ED Diagnoses   Final diagnoses:  Community acquired pneumonia of right lung, unspecified part of lung   New Prescriptions New Prescriptions   No medications on file   I personally preformed the services scribed in my presence. The recorded information has been reviewed is accurate. Raeford Razor, MD.     Raeford Razor, MD 06/24/16 1054

## 2016-06-20 NOTE — ED Notes (Signed)
Pt called to triage with no answer  

## 2016-06-20 NOTE — ED Triage Notes (Signed)
Pt reports cough and fever for a couple of days  Dr Abbe AmsterdamHopkins

## 2016-06-20 NOTE — ED Notes (Signed)
Date and time results received: 06/20/16 11:14 PM   Test: K+ Critical Value: 2.7 Name of Provider Notified: [place]  Orders Received? Or Actions Taken?: no new orders at this time.

## 2016-06-21 ENCOUNTER — Encounter (HOSPITAL_COMMUNITY): Payer: Self-pay

## 2016-06-21 DIAGNOSIS — J189 Pneumonia, unspecified organism: Principal | ICD-10-CM

## 2016-06-21 DIAGNOSIS — J9601 Acute respiratory failure with hypoxia: Secondary | ICD-10-CM

## 2016-06-21 DIAGNOSIS — J449 Chronic obstructive pulmonary disease, unspecified: Secondary | ICD-10-CM

## 2016-06-21 DIAGNOSIS — E139 Other specified diabetes mellitus without complications: Secondary | ICD-10-CM

## 2016-06-21 DIAGNOSIS — E876 Hypokalemia: Secondary | ICD-10-CM

## 2016-06-21 LAB — BASIC METABOLIC PANEL WITH GFR
Anion gap: 7 (ref 5–15)
BUN: 10 mg/dL (ref 6–20)
CO2: 30 mmol/L (ref 22–32)
Calcium: 8.2 mg/dL — ABNORMAL LOW (ref 8.9–10.3)
Chloride: 103 mmol/L (ref 101–111)
Creatinine, Ser: 0.56 mg/dL (ref 0.44–1.00)
GFR calc Af Amer: >60 mL/min
GFR calc non Af Amer: >60 mL/min
Glucose, Bld: 153 mg/dL — ABNORMAL HIGH (ref 65–99)
Potassium: 2.8 mmol/L — ABNORMAL LOW (ref 3.5–5.1)
Sodium: 140 mmol/L (ref 135–145)

## 2016-06-21 LAB — CBC WITH DIFFERENTIAL/PLATELET
BASOS ABS: 0 10*3/uL (ref 0.0–0.1)
BASOS PCT: 0 %
EOS ABS: 0 10*3/uL (ref 0.0–0.7)
Eosinophils Relative: 0 %
HEMATOCRIT: 28.2 % — AB (ref 36.0–46.0)
HEMOGLOBIN: 8.2 g/dL — AB (ref 12.0–15.0)
Lymphocytes Relative: 23 %
Lymphs Abs: 1.9 10*3/uL (ref 0.7–4.0)
MCH: 24.9 pg — ABNORMAL LOW (ref 26.0–34.0)
MCHC: 29.1 g/dL — ABNORMAL LOW (ref 30.0–36.0)
MCV: 85.7 fL (ref 78.0–100.0)
MONOS PCT: 9 %
Monocytes Absolute: 0.7 10*3/uL (ref 0.1–1.0)
NEUTROS ABS: 5.6 10*3/uL (ref 1.7–7.7)
NEUTROS PCT: 68 %
Platelets: 224 10*3/uL (ref 150–400)
RBC: 3.29 MIL/uL — AB (ref 3.87–5.11)
RDW: 21.6 % — ABNORMAL HIGH (ref 11.5–15.5)
WBC: 8.2 10*3/uL (ref 4.0–10.5)

## 2016-06-21 LAB — GLUCOSE, CAPILLARY
GLUCOSE-CAPILLARY: 74 mg/dL (ref 65–99)
GLUCOSE-CAPILLARY: 131 mg/dL — AB (ref 65–99)
GLUCOSE-CAPILLARY: 119 mg/dL — AB (ref 65–99)
Glucose-Capillary: 170 mg/dL — ABNORMAL HIGH (ref 65–99)

## 2016-06-21 MED ORDER — INSULIN ASPART 100 UNIT/ML ~~LOC~~ SOLN
0.0000 [IU] | Freq: Three times a day (TID) | SUBCUTANEOUS | Status: DC
  Administered 2016-06-21: 1 [IU] via SUBCUTANEOUS
  Administered 2016-06-22: 2 [IU] via SUBCUTANEOUS

## 2016-06-21 MED ORDER — DEXTROSE 5 % IV SOLN
500.0000 mg | INTRAVENOUS | Status: DC
  Administered 2016-06-21 – 2016-06-22 (×2): 500 mg via INTRAVENOUS
  Filled 2016-06-21 (×3): qty 500

## 2016-06-21 MED ORDER — PREGABALIN 75 MG PO CAPS
300.0000 mg | ORAL_CAPSULE | Freq: Two times a day (BID) | ORAL | Status: DC
  Administered 2016-06-21 – 2016-06-22 (×4): 300 mg via ORAL
  Filled 2016-06-21 (×4): qty 4

## 2016-06-21 MED ORDER — ROPINIROLE HCL 0.25 MG PO TABS
0.5000 mg | ORAL_TABLET | Freq: Three times a day (TID) | ORAL | Status: DC
  Administered 2016-06-21 – 2016-06-22 (×4): 0.5 mg via ORAL
  Filled 2016-06-21 (×4): qty 2

## 2016-06-21 MED ORDER — IBUPROFEN 800 MG PO TABS
800.0000 mg | ORAL_TABLET | Freq: Three times a day (TID) | ORAL | Status: DC | PRN
  Administered 2016-06-21: 800 mg via ORAL
  Filled 2016-06-21: qty 1

## 2016-06-21 MED ORDER — ATORVASTATIN CALCIUM 40 MG PO TABS
40.0000 mg | ORAL_TABLET | Freq: Every day | ORAL | Status: DC
  Administered 2016-06-21 (×2): 40 mg via ORAL
  Filled 2016-06-21 (×2): qty 1

## 2016-06-21 MED ORDER — METHYLPREDNISOLONE SODIUM SUCC 40 MG IJ SOLR
40.0000 mg | Freq: Two times a day (BID) | INTRAMUSCULAR | Status: DC
  Administered 2016-06-21 – 2016-06-22 (×3): 40 mg via INTRAVENOUS
  Filled 2016-06-21 (×3): qty 1

## 2016-06-21 MED ORDER — DEXTROSE 5 % IV SOLN
1.0000 g | INTRAVENOUS | Status: DC
  Administered 2016-06-21: 1 g via INTRAVENOUS
  Filled 2016-06-21 (×2): qty 10

## 2016-06-21 MED ORDER — DEXTROSE 5 % IV SOLN
INTRAVENOUS | Status: AC
  Filled 2016-06-21: qty 500

## 2016-06-21 MED ORDER — VALACYCLOVIR HCL 500 MG PO TABS
500.0000 mg | ORAL_TABLET | Freq: Two times a day (BID) | ORAL | Status: DC
Start: 2016-06-21 — End: 2016-06-22
  Administered 2016-06-21 – 2016-06-22 (×4): 500 mg via ORAL
  Filled 2016-06-21 (×4): qty 1

## 2016-06-21 MED ORDER — ALBUTEROL SULFATE (2.5 MG/3ML) 0.083% IN NEBU: 2.5000 mg | INHALATION_SOLUTION | Freq: Four times a day (QID) | RESPIRATORY_TRACT | Status: DC | PRN

## 2016-06-21 MED ORDER — LEVOTHYROXINE SODIUM 75 MCG PO TABS
75.0000 ug | ORAL_TABLET | Freq: Every day | ORAL | Status: DC
  Administered 2016-06-21 – 2016-06-22 (×2): 75 ug via ORAL
  Filled 2016-06-21 (×2): qty 1

## 2016-06-21 MED ORDER — GUAIFENESIN ER 600 MG PO TB12
1200.0000 mg | ORAL_TABLET | Freq: Two times a day (BID) | ORAL | Status: DC
  Administered 2016-06-21 – 2016-06-22 (×3): 1200 mg via ORAL
  Filled 2016-06-21 (×3): qty 2

## 2016-06-21 MED ORDER — LISINOPRIL 5 MG PO TABS
2.5000 mg | ORAL_TABLET | Freq: Every day | ORAL | Status: DC
  Administered 2016-06-21 – 2016-06-22 (×2): 2.5 mg via ORAL
  Filled 2016-06-21 (×2): qty 1

## 2016-06-21 MED ORDER — HYDROCODONE-ACETAMINOPHEN 10-325 MG PO TABS
1.0000 | ORAL_TABLET | Freq: Four times a day (QID) | ORAL | Status: DC | PRN
  Administered 2016-06-21 – 2016-06-22 (×2): 1 via ORAL
  Filled 2016-06-21 (×3): qty 1

## 2016-06-21 MED ORDER — POTASSIUM CHLORIDE IN NACL 20-0.9 MEQ/L-% IV SOLN
INTRAVENOUS | Status: AC
  Administered 2016-06-21: 01:00:00 via INTRAVENOUS

## 2016-06-21 NOTE — H&P (Signed)
History and Physical    Patricia Bass WRU:[id number] DOB: February 20, 1960 DOA: 06/20/2016  PCP: [name], MD  Patient coming from: home  Chief Complaint: fever, cough  HPI: PROMISE WELDIN is a 56 y.o. female with medical history significant of COPD not on supplemental o2 at home,  DM, HTN, HLD comes in with 2 days of worsening cough and high fevers.  Has also been sob with no chest pain.  More sob with exertion.  Fever up to over 102 today.  No recent abx or hospitalizations.  Reports no wheezing.  No n/v/d.  Cough productive.  Pt found to be hypoxic and have pna and referred for admission for her hypoxia.    Review of Systems: As per HPI otherwise 10 point review of systems negative.   Past Medical History:  Diagnosis Date  . Anxiety   . Bulging disc   . Carpal tunnel syndrome   . Cervicalgia   . Chronic airway obstruction (HCC)   . COPD (chronic obstructive pulmonary disease) (HCC)   . Coronary artery disease   . Diabetes mellitus (HCC) 01/11/2012  . GERD (gastroesophageal reflux disease)   . Headache(784.0)   . Hypothyroid   . Lumbago   . Migraine   . Myalgia and myositis, unspecified   . Neuropathy   . Pneumonia   . Restless leg syndrome   . Shortness of breath   . Sleep apnea     Past Surgical History:  Procedure Laterality Date  . BLADDER SUSPENSION    . CARDIAC CATHETERIZATION    . CHOLECYSTECTOMY    . OOPHORECTOMY    . TUBAL LIGATION    . ULNAR NERVE TRANSPOSITION  04/01/2011   Procedure: ULNAR NERVE DECOMPRESSION/TRANSPOSITION;  Surgeon: Dominica Severin, MD;  Location: [facility] OR;  Service: Orthopedics;  Laterality: Left;  ULNAR NERVE RELEASE WITH ANTERIOR TRANS POSITION REPAIR RECONSTRUCTION AS NECESSARY     reports that she has been smoking Cigarettes and E-cigarettes.  She has a 40.00 pack-year smoking history. She has never used smokeless tobacco. She reports that she uses drugs, including Hydrocodone and Marijuana. She reports that she does not drink  alcohol.  Allergies  Allergen Reactions  . Duragesic Disc Transdermal System [Fentanyl] Nausea And Vomiting    No family history on file.  No premature CAD  Prior to Admission medications   Medication Sig Start Date End Date Taking? Authorizing Provider  albuterol (PROVENTIL HFA;VENTOLIN HFA) 108 (90 BASE) MCG/ACT inhaler Inhale 2 puffs into the lungs every 6 (six) hours as needed for wheezing or shortness of breath.    Yes [provider]  atorvastatin (LIPITOR) 40 MG tablet Take 40 mg by mouth at bedtime.   Yes [provider]  cetirizine (ZYRTEC) 10 MG tablet Take 10 mg by mouth daily as needed for allergies.    Yes [name], MD  clonazePAM (KLONOPIN) 1 MG tablet Take 1 mg by mouth 3 (three) times daily as needed for anxiety.   Yes [provider]  doxepin (SINEQUAN) 100 MG capsule Take 100 mg by mouth at bedtime.   Yes [provider]  HYDROcodone-acetaminophen (NORCO) 10-325 MG per tablet Take 1 tablet by mouth every 6 (six) hours as needed for moderate pain.    Yes [provider]  ibuprofen (ADVIL,MOTRIN) 800 MG tablet Take 800 mg by mouth every 8 (eight) hours as needed for mild pain or moderate pain.    Yes [provider]  levothyroxine (SYNTHROID, LEVOTHROID) 75 MCG tablet Take 1  tablet (75 mcg total) by mouth daily before breakfast. 09/05/14  Yes Benjiman CorePickering, Nathan, MD  lidocaine (XYLOCAINE) 5 % ointment Apply 1 application topically 4 (four) times daily as needed for mild pain.   Yes [provider]  lisinopril (PRINIVIL,ZESTRIL) 2.5 MG tablet Take 2.5 mg by mouth daily.   Yes [provider]  metFORMIN (GLUCOPHAGE) 500 MG tablet Take 1 tablet (500 mg total) by mouth 2 (two) times daily with a meal. 09/05/14  Yes Benjiman CorePickering, Nathan, MD  mometasone (NASONEX) 50 MCG/ACT nasal spray Place 2 sprays into the nose daily as needed (for rhinitis).   Yes [provider]  naphazoline-glycerin (CLEAR EYES)  0.012-0.2 % SOLN Place 1-2 drops into both eyes every 4 (four) hours as needed for irritation.    Yes [provider]  omega-3 acid ethyl esters (LOVAZA) 1 g capsule Take 1 g by mouth daily.   Yes [provider]  omeprazole (PRILOSEC) 40 MG capsule Take 1 capsule (40 mg total) by mouth daily. Patient taking differently: Take 40 mg by mouth daily as needed (for acid reflux).  09/05/14  Yes Benjiman CorePickering, Nathan, MD  polyethylene glycol [facility](MIRALAX / GLYCOLAX) packet Take 17 g by mouth daily as needed for mild constipation.   Yes [provider]  pregabalin (LYRICA) 300 MG capsule Take 1 capsule (300 mg total) by mouth 2 (two) times daily. 09/05/14  Yes Benjiman CorePickering, Nathan, MD  rOPINIRole (REQUIP) 0.5 MG tablet Take 1 tablet (0.5 mg total) by mouth 3 (three) times daily. 09/05/14  Yes Benjiman CorePickering, Nathan, MD  valACYclovir (VALTREX) 500 MG tablet Take 500 mg by mouth 2 (two) times daily.   Jane CanaryYes Hawkins, Edward, MD    Physical Exam: Vitals:   06/20/16 2250 06/20/16 2300 06/20/16 2330 06/20/16 2353  BP:  (!) 97/52 103/69 103/69  Pulse:  (!) 117 (!) 110 (!) 109  Resp:  16 19 16   Temp: 99.8 F (37.7 C)     TempSrc: Oral     SpO2:  95% 94% 96%  Weight:      Height:        Constitutional: NAD, calm, comfortable Vitals:   06/20/16 2250 06/20/16 2300 06/20/16 2330 06/20/16 2353  BP:  (!) 97/52 103/69 103/69  Pulse:  (!) 117 (!) 110 (!) 109  Resp:  16 19 16   Temp: 99.8 F (37.7 C)     TempSrc: Oral     SpO2:  95% 94% 96%  Weight:      Height:       Eyes: PERRL, lids and conjunctivae normal ENMT: Mucous membranes are moist. Posterior pharynx clear of any exudate or lesions.Normal dentition.  Neck: normal, supple, no masses, no thyromegaly Respiratory: clear to auscultation bilaterally, no wheezing, no crackles. Normal respiratory effort. No accessory muscle use.  Cardiovascular: Regular rate and rhythm, no murmurs / rubs / gallops. No extremity edema. 2+ pedal pulses. No carotid  bruits.  Abdomen: no tenderness, no masses palpated. No hepatosplenomegaly. Bowel sounds positive.  Musculoskeletal: no clubbing / cyanosis. No joint deformity upper and lower extremities. Good ROM, no contractures. Normal muscle tone.  Skin: no rashes, lesions, ulcers. No induration Neurologic: CN 2-12 grossly intact. Sensation intact, DTR normal. Strength 5/5 in all 4.  Psychiatric: Normal judgment and insight. Alert and oriented x 3. Normal mood.    Labs on Admission: I have personally reviewed following labs and imaging studies  CBC:  Recent Labs Lab 06/20/16 2201  WBC 11.3*  NEUTROABS 9.2*  HGB 9.7*  HCT 32.0*  MCV 82.9  PLT 263   Basic Metabolic Panel:  Recent Labs Lab 06/20/16 2201  NA 137  K 2.7*  CL 100*  CO2 28  GLUCOSE 181*  BUN 11  CREATININE 0.75  CALCIUM 8.7*  MG 1.7   GFR: Estimated Creatinine Clearance: 73.4 mL/min (by C-G formula based on SCr of 0.75 mg/dL).    Radiological Exams on Admission: Dg Chest 2 View  Result Date: 06/20/2016 CLINICAL DATA:  56 year old female with fever and cough. EXAM: CHEST  2 VIEW COMPARISON:  Radiograph dated 01/24/2012 FINDINGS: Confluent area of airspace density in the right mid to lower lung field noted concerning for developing pneumonia, likely in the right middle lobe. There is no pleural effusion or pneumothorax. Stable top-normal cardiac size. No acute osseous pathology. IMPRESSION: Findings concerning for developing right middle lobe pneumonia. Clinical correlation and follow-up recommended. Electronically Signed   By: Elgie Collard M.D.   On: 06/20/2016 23:23    EKG: Independently reviewed.sinus tachy with RBBB which is new c/wold ekgs showing LAFB in 2013 cxr reviewed rml infiltrate Old chart reviewed Case discussed with dr Juleen China   Assessment/Plan 56 yo female with acute hypoxic respiratory failure due to CAP  Principal Problem:   CAP (community acquired pneumonia)- pna pathway.  Obtain blood and  sputum cx.  Iv rocephin and azithro ordered.  Supplemental oxygen as needed.    Active Problems:   Chronic airway obstruction (HCC)- no wheezing on exam, no steroids at this time, nebs ordered   Acute respiratory failure with hypoxia (HCC)- cont 2 liters [place] to keep sats above 88%.  Was mid 14s on RA in ED.     Hypokalemia- replete thru iv , repeat bmp in the am   Anxiety- noted, stable   Diabetes mellitus (HCC)- place on SSI   PCP hawkins   DVT prophylaxis: scds  Code Status: full Family Communication: none Disposition Plan:  Per day team Consults called:  none Admission status:  Admission due to hypoxia    Johnnathan Hagemeister A MD [facility]  If 7PM-7AM, please contact night-coverage [email or web address] Password [email or web address]  06/21/2016, 12:01 AM

## 2016-06-21 NOTE — Progress Notes (Addendum)
Subjective: She was admitted with community-acquired pneumonia. She says she feels a little better. She's coughing mostly nonproductively but occasionally brings up thick clear sputum. She had significant fever at home. Fever is less prominent here. No chest pain nausea vomiting or diarrhea  Objective: Vital signs in last 24 hours: Temp:  [99.8 F (37.7 C)-102.6 F (39.2 C)] 99.8 F (37.7 C) (06/17 2250) Pulse Rate:  [90-135] 90 (06/18 0804) Resp:  [16-23] 16 (06/18 0804) BP: (97-117)/(52-69) 103/69 (06/17 2353) SpO2:  [77 %-98 %] 95 % (06/18 0804) Weight:  [81.6 kg (180 lb)] 81.6 kg (180 lb) (06/17 2145) Weight change:  Last BM Date: 06/12/16  Intake/Output from previous day: 06/17 0701 - 06/18 0700 In: 896.3 [P.O.:480; I.V.:166.3; IV Piggyback:250] Out: 400 [Urine:400]  PHYSICAL EXAM General appearance: alert, cooperative and no distress Resp: She has crackles on the right Cardio: regular rate and rhythm, S1, S2 normal, no murmur, click, rub or gallop GI: soft, non-tender; bowel sounds normal; no masses,  no organomegaly Extremities: extremities normal, atraumatic, no cyanosis or edema Skin warm and dry  Lab Results:  Results for orders placed or performed during the hospital encounter of 06/20/16 (from the past 48 hour(s))  CBC with Differential     Status: Abnormal   Collection Time: 06/20/16 10:01 PM  Result Value Ref Range   WBC 11.3 (H) 4.0 - 10.5 K/uL   RBC 3.86 (L) 3.87 - 5.11 MIL/uL   Hemoglobin 9.7 (L) 12.0 - 15.0 g/dL   HCT 32.0 (L) 36.0 - 46.0 %   MCV 82.9 78.0 - 100.0 fL   MCH 25.1 (L) 26.0 - 34.0 pg   MCHC 30.3 30.0 - 36.0 g/dL   RDW 21.7 (H) 11.5 - 15.5 %   Platelets 263 150 - 400 K/uL   Neutrophils Relative % 82 %   Neutro Abs 9.2 (H) 1.7 - 7.7 K/uL   Lymphocytes Relative 11 %   Lymphs Abs 1.2 0.7 - 4.0 K/uL   Monocytes Relative 7 %   Monocytes Absolute 0.8 0.1 - 1.0 K/uL   Eosinophils Relative 0 %   Eosinophils Absolute 0.0 0.0 - 0.7 K/uL   Basophils Relative 0 %   Basophils Absolute 0.0 0.0 - 0.1 K/uL  Basic metabolic panel     Status: Abnormal   Collection Time: 06/20/16 10:01 PM  Result Value Ref Range   Sodium 137 135 - 145 mmol/L   Potassium 2.7 (LL) 3.5 - 5.1 mmol/L    Comment: CRITICAL RESULT CALLED TO, READ BACK BY AND VERIFIED WITH: LASHLEY,S. ON 2315 ON 06/20/2016 BY EVA    Chloride 100 (L) 101 - 111 mmol/L   CO2 28 22 - 32 mmol/L   Glucose, Bld 181 (H) 65 - 99 mg/dL   BUN 11 6 - 20 mg/dL   Creatinine, Ser 0.75 0.44 - 1.00 mg/dL   Calcium 8.7 (L) 8.9 - 10.3 mg/dL   GFR calc non Af Amer >60 >60 mL/min   GFR calc Af Amer >60 >60 mL/min    Comment: (NOTE) The eGFR has been calculated using the CKD EPI equation. This calculation has not been validated in all clinical situations. eGFR's persistently <60 mL/min signify possible Chronic Kidney Disease.    Anion gap 9 5 - 15  Magnesium     Status: None   Collection Time: 06/20/16 10:01 PM  Result Value Ref Range   Magnesium 1.7 1.7 - 2.4 mg/dL  Lactic acid, plasma     Status: None   Collection  Time: 06/20/16 10:12 PM  Result Value Ref Range   Lactic Acid, Venous 1.6 0.5 - 1.9 mmol/L  Culture, blood (routine x 2) Call MD if unable to obtain prior to antibiotics being given     Status: None (Preliminary result)   Collection Time: 06/21/16  4:57 AM  Result Value Ref Range   Specimen Description BLOOD LEFT HAND    Special Requests      BOTTLES DRAWN AEROBIC AND ANAEROBIC Blood Culture adequate volume   Culture PENDING    Report Status PENDING   Basic metabolic panel     Status: Abnormal   Collection Time: 06/21/16  4:57 AM  Result Value Ref Range   Sodium 140 135 - 145 mmol/L   Potassium 2.8 (L) 3.5 - 5.1 mmol/L   Chloride 103 101 - 111 mmol/L   CO2 30 22 - 32 mmol/L   Glucose, Bld 153 (H) 65 - 99 mg/dL   BUN 10 6 - 20 mg/dL   Creatinine, Ser 0.56 0.44 - 1.00 mg/dL   Calcium 8.2 (L) 8.9 - 10.3 mg/dL   GFR calc non Af Amer >60 >60 mL/min   GFR calc Af  Amer >60 >60 mL/min    Comment: (NOTE) The eGFR has been calculated using the CKD EPI equation. This calculation has not been validated in all clinical situations. eGFR's persistently <60 mL/min signify possible Chronic Kidney Disease.    Anion gap 7 5 - 15  CBC WITH DIFFERENTIAL     Status: Abnormal   Collection Time: 06/21/16  4:57 AM  Result Value Ref Range   WBC 8.2 4.0 - 10.5 K/uL   RBC 3.29 (L) 3.87 - 5.11 MIL/uL   Hemoglobin 8.2 (L) 12.0 - 15.0 g/dL   HCT 28.2 (L) 36.0 - 46.0 %   MCV 85.7 78.0 - 100.0 fL   MCH 24.9 (L) 26.0 - 34.0 pg   MCHC 29.1 (L) 30.0 - 36.0 g/dL   RDW 21.6 (H) 11.5 - 15.5 %   Platelets 224 150 - 400 K/uL   Neutrophils Relative % 68 %   Neutro Abs 5.6 1.7 - 7.7 K/uL   Lymphocytes Relative 23 %   Lymphs Abs 1.9 0.7 - 4.0 K/uL   Monocytes Relative 9 %   Monocytes Absolute 0.7 0.1 - 1.0 K/uL   Eosinophils Relative 0 %   Eosinophils Absolute 0.0 0.0 - 0.7 K/uL   Basophils Relative 0 %   Basophils Absolute 0.0 0.0 - 0.1 K/uL  Culture, blood (routine x 2) Call MD if unable to obtain prior to antibiotics being given     Status: None (Preliminary result)   Collection Time: 06/21/16  5:58 AM  Result Value Ref Range   Specimen Description BLOOD LEFT ARM    Special Requests      BOTTLES DRAWN AEROBIC AND ANAEROBIC Blood Culture adequate volume   Culture PENDING    Report Status PENDING   Glucose, capillary     Status: None   Collection Time: 06/21/16  8:08 AM  Result Value Ref Range   Glucose-Capillary 74 65 - 99 mg/dL    ABGS No results for input(s): PHART, PO2ART, TCO2, HCO3 in the last 72 hours.  Invalid input(s): PCO2 CULTURES Recent Results (from the past 240 hour(s))  Culture, blood (routine x 2) Call MD if unable to obtain prior to antibiotics being given     Status: None (Preliminary result)   Collection Time: 06/21/16  4:57 AM  Result Value Ref Range Status  Specimen Description BLOOD LEFT HAND  Final   Special Requests   Final     BOTTLES DRAWN AEROBIC AND ANAEROBIC Blood Culture adequate volume   Culture PENDING  Incomplete   Report Status PENDING  Incomplete  Culture, blood (routine x 2) Call MD if unable to obtain prior to antibiotics being given     Status: None (Preliminary result)   Collection Time: 06/21/16  5:58 AM  Result Value Ref Range Status   Specimen Description BLOOD LEFT ARM  Final   Special Requests   Final    BOTTLES DRAWN AEROBIC AND ANAEROBIC Blood Culture adequate volume   Culture PENDING  Incomplete   Report Status PENDING  Incomplete   Studies/Results: Dg Chest 2 View  Result Date: 06/20/2016 CLINICAL DATA:  56 year old female with fever and cough. EXAM: CHEST  2 VIEW COMPARISON:  Radiograph dated 01/24/2012 FINDINGS: Confluent area of airspace density in the right mid to lower lung field noted concerning for developing pneumonia, likely in the right middle lobe. There is no pleural effusion or pneumothorax. Stable top-normal cardiac size. No acute osseous pathology. IMPRESSION: Findings concerning for developing right middle lobe pneumonia. Clinical correlation and follow-up recommended. Electronically Signed   By: Anner Crete M.D.   On: 06/20/2016 23:23    Medications:  Prior to Admission:  Prescriptions Prior to Admission  Medication Sig Dispense Refill Last Dose  . albuterol (PROVENTIL HFA;VENTOLIN HFA) 108 (90 BASE) MCG/ACT inhaler Inhale 2 puffs into the lungs every 6 (six) hours as needed for wheezing or shortness of breath.    06/20/2016 at Unknown time  . atorvastatin (LIPITOR) 40 MG tablet Take 40 mg by mouth at bedtime.   06/19/2016 at Unknown time  . cetirizine (ZYRTEC) 10 MG tablet Take 10 mg by mouth daily as needed for allergies.    06/20/2016 at Unknown time  . clonazePAM (KLONOPIN) 1 MG tablet Take 1 mg by mouth 3 (three) times daily as needed for anxiety.   06/20/2016 at Unknown time  . doxepin (SINEQUAN) 100 MG capsule Take 100 mg by mouth at bedtime.   06/19/2016 at Unknown  time  . HYDROcodone-acetaminophen (NORCO) 10-325 MG per tablet Take 1 tablet by mouth every 6 (six) hours as needed for moderate pain.    06/20/2016 at 1700  . ibuprofen (ADVIL,MOTRIN) 800 MG tablet Take 800 mg by mouth every 8 (eight) hours as needed for mild pain or moderate pain.    06/20/2016 at 1500  . levothyroxine (SYNTHROID, LEVOTHROID) 75 MCG tablet Take 1 tablet (75 mcg total) by mouth daily before breakfast. 30 tablet 0 06/20/2016 at Unknown time  . lidocaine (XYLOCAINE) 5 % ointment Apply 1 application topically 4 (four) times daily as needed for mild pain.   06/19/2016 at Unknown time  . lisinopril (PRINIVIL,ZESTRIL) 2.5 MG tablet Take 2.5 mg by mouth daily.   06/20/2016 at Unknown time  . metFORMIN (GLUCOPHAGE) 500 MG tablet Take 1 tablet (500 mg total) by mouth 2 (two) times daily with a meal. 60 tablet 0 06/20/2016 at Unknown time  . mometasone (NASONEX) 50 MCG/ACT nasal spray Place 2 sprays into the nose daily as needed (for rhinitis).   06/19/2016 at Unknown time  . naphazoline-glycerin (CLEAR EYES) 0.012-0.2 % SOLN Place 1-2 drops into both eyes every 4 (four) hours as needed for irritation.    Past Week at Unknown time  . omega-3 acid ethyl esters (LOVAZA) 1 g capsule Take 1 g by mouth daily.   06/20/2016 at Unknown time  .  omeprazole (PRILOSEC) 40 MG capsule Take 1 capsule (40 mg total) by mouth daily. (Patient taking differently: Take 40 mg by mouth daily as needed (for acid reflux). ) 30 capsule 0 Past Week at Unknown time  . polyethylene glycol (MIRALAX / GLYCOLAX) packet Take 17 g by mouth daily as needed for mild constipation.   Past Month at Unknown time  . pregabalin (LYRICA) 300 MG capsule Take 1 capsule (300 mg total) by mouth 2 (two) times daily. 60 capsule 0 06/20/2016 at Unknown time  . rOPINIRole (REQUIP) 0.5 MG tablet Take 1 tablet (0.5 mg total) by mouth 3 (three) times daily. 90 tablet 0 06/20/2016 at Unknown time  . valACYclovir (VALTREX) 500 MG tablet Take 500 mg by mouth 2  (two) times daily.   06/20/2016 at Unknown time   Scheduled: . atorvastatin  40 mg Oral QHS  . guaiFENesin  1,200 mg Oral BID  . insulin aspart  0-9 Units Subcutaneous TID WC  . levothyroxine  75 mcg Oral QAC breakfast  . lisinopril  2.5 mg Oral Daily  . methylPREDNISolone (SOLU-MEDROL) injection  40 mg Intravenous Q12H  . pregabalin  300 mg Oral BID  . rOPINIRole  0.5 mg Oral TID  . valACYclovir  500 mg Oral BID   Continuous: . 0.9 % NaCl with KCl 20 mEq / L 75 mL/hr at 06/21/16 0047  . azithromycin Stopped (06/21/16 0422)  . cefTRIAXone (ROCEPHIN)  IV     YIR:SWNIOEVOJ, HYDROcodone-acetaminophen, ibuprofen  Assesment: She was admitted with community-acquired pneumonia. She has COPD at baseline. She also has diabetes at baseline. She has had cough and congestion. Chest x-ray which I personally reviewed shows pneumonia in the right lower lung. She has diabetes at baseline. She has acute hypoxic respiratory failure. Principal Problem:   CAP (community acquired pneumonia) Active Problems:   Chronic airway obstruction (HCC)   Anxiety   Diabetes mellitus ([place])   Acute respiratory failure with hypoxia (HCC)   Hypokalemia    Plan: Continue treatments. Add flutter valve and Mucinex and steroids.    LOS: 1 day   Patricia Bass L 06/21/2016, 8:26 AM

## 2016-06-21 NOTE — Care Management Note (Signed)
Case Management Note  Patient Details  Name: Patricia Bass MRN: [id number] Date of Birth: 09-23-1960  Subjective/Objective:                  Pt admitted with CAP. She is from home, lives with family. And is ind with ADL's. She has PCP, transportation and insurance with drug coverage. She uses cane PRN. She has aid 10hr/wk and recently had approval for increasing her hours. Pt has no home O2 or neb machine pta. She communicates no needs.   Action/Plan: Plan for return home with self care. CM will cont to follow to DC.  Expected Discharge Date:     06/22/2016             Expected Discharge Plan:  Home/Self Care  In-House Referral:  NA  Discharge planning Services  CM Consult  Post Acute Care Choice:  NA Choice offered to:  NA  Status of Service:  In process, will continue to follow  Malcolm MetroChildress, Izaah Westman Demske, RN 06/21/2016, 10:02 AM

## 2016-06-22 LAB — CBC WITH DIFFERENTIAL/PLATELET
Basophils Absolute: 0 10*3/uL (ref 0.0–0.1)
Basophils Relative: 0 %
EOS PCT: 0 %
Eosinophils Absolute: 0 10*3/uL (ref 0.0–0.7)
HEMATOCRIT: 30.1 % — AB (ref 36.0–46.0)
HEMOGLOBIN: 9 g/dL — AB (ref 12.0–15.0)
LYMPHS ABS: 0.8 10*3/uL (ref 0.7–4.0)
LYMPHS PCT: 9 %
MCH: 25 pg — ABNORMAL LOW (ref 26.0–34.0)
MCHC: 29.9 g/dL — ABNORMAL LOW (ref 30.0–36.0)
MCV: 83.6 fL (ref 78.0–100.0)
Monocytes Absolute: 0.1 10*3/uL (ref 0.1–1.0)
Monocytes Relative: 1 %
NEUTROS ABS: 7.4 10*3/uL (ref 1.7–7.7)
Neutrophils Relative %: 89 %
Platelets: 279 10*3/uL (ref 150–400)
RBC: 3.6 MIL/uL — AB (ref 3.87–5.11)
RDW: 21.4 % — ABNORMAL HIGH (ref 11.5–15.5)
WBC: 8.3 10*3/uL (ref 4.0–10.5)

## 2016-06-22 LAB — IRON AND TIBC
Iron: 9 ug/dL — ABNORMAL LOW (ref 28–170)
SATURATION RATIOS: 3 % — AB (ref 10.4–31.8)
TIBC: 316 ug/dL (ref 250–450)
UIBC: 307 ug/dL

## 2016-06-22 LAB — GLUCOSE, CAPILLARY
Glucose-Capillary: 160 mg/dL — ABNORMAL HIGH (ref 65–99)
Glucose-Capillary: 226 mg/dL — ABNORMAL HIGH (ref 65–99)

## 2016-06-22 LAB — BASIC METABOLIC PANEL
ANION GAP: 9 (ref 5–15)
BUN: 10 mg/dL (ref 6–20)
CHLORIDE: 101 mmol/L (ref 101–111)
CO2: 26 mmol/L (ref 22–32)
Calcium: 8.8 mg/dL — ABNORMAL LOW (ref 8.9–10.3)
Creatinine, Ser: 0.54 mg/dL (ref 0.44–1.00)
GFR calc non Af Amer: >60 mL/min (ref >60)
GLUCOSE: 325 mg/dL — AB (ref 65–99)
POTASSIUM: 3.5 mmol/L (ref 3.5–5.1)
Sodium: 136 mmol/L (ref 135–145)

## 2016-06-22 MED ORDER — CEFUROXIME AXETIL 250 MG PO TABS: 250.0000 mg | ORAL_TABLET | Freq: Two times a day (BID) | ORAL | 0 refills | Status: DC

## 2016-06-22 MED ORDER — AZITHROMYCIN 250 MG PO TABS: ORAL_TABLET | ORAL | 0 refills | Status: AC

## 2016-06-22 MED ORDER — FERROUS SULFATE 325 (65 FE) MG PO TABS: 325.0000 mg | ORAL_TABLET | Freq: Every day | ORAL | 3 refills | Status: DC

## 2016-06-22 MED ORDER — GUAIFENESIN ER 600 MG PO TB12: 1200.0000 mg | ORAL_TABLET | Freq: Two times a day (BID) | ORAL | 1 refills | Status: DC

## 2016-06-22 MED ORDER — PREDNISONE 10 MG (21) PO TBPK: ORAL_TABLET | ORAL | 0 refills | Status: DC

## 2016-06-22 NOTE — Care Management Important Message (Signed)
Important Message  Patient Details  Name: Marton RedwoodCarrie B Goins MRN: [id number] Date of Birth: 1960/10/27   Medicare Important Message Given:  Yes    Malcolm MetroChildress, Yanel Dombrosky Demske, RN 06/22/2016, 9:40 AM

## 2016-06-22 NOTE — Progress Notes (Signed)
Patient IV removed, tolerated well. Discharge instructions given at bedside.  

## 2016-06-22 NOTE — Care Management Note (Addendum)
Case Management Note  Patient Details  Name: Patricia Bass MRN: [id number] Date of Birth: Sep 01, 1960  Expected Discharge Date:  06/22/16               Expected Discharge Plan:  Home/Self Care  In-House Referral:  NA  Discharge planning Services  CM Consult  Post Acute Care Choice:  Durable Medical Equipment Choice offered to:  Patient  DME Arranged:  Oxygen DME Agency:  [place].  Status of Service:  Completed, signed off  Additional Comments: Pt discharging home today with self care. She has met qualifications for supplemental oxygen. Pt has COPD at baseline and needs oxygen because because alternate treatments (albuterol) have been tried and found to be insufficient. She has chosen [facility] from list of DME providers and [facility] rep, Vaughan Basta, aware of referral and will obtain pt info from chart. Port oxygen tank will be delivered to room prior to DC. No further needs communicated. Pt on [facility] registry and will be referred for enrollment in [place] transition calls for pneumonia.   Sherald Barge, RN 06/22/2016, 9:41 AM

## 2016-06-22 NOTE — Progress Notes (Signed)
Subjective: She says she feels much better and wants to go home. She has no new complaints. Her breathing is better. She's been able to get up and move around.  Objective: Vital signs in last 24 hours: Temp:  [98 F (36.7 C)-99.1 F (37.3 C)] 98 F (36.7 C) (06/19 0455) Pulse Rate:  [90-102] 90 (06/19 0455) Resp:  [16] 16 (06/19 0455) BP: (112-133)/(69-76) 124/75 (06/19 0455) SpO2:  [92 %-96 %] 92 % (06/19 0455) Weight change:  Last BM Date: 06/20/16  Intake/Output from previous day: 06/18 0701 - 06/19 0700 In: 1260 [P.O.:960; IV Piggyback:300] Out: -   PHYSICAL EXAM General appearance: alert, cooperative and no distress Resp: rhonchi bilaterally Cardio: regular rate and rhythm, S1, S2 normal, no murmur, click, rub or gallop GI: soft, non-tender; bowel sounds normal; no masses,  no organomegaly Extremities: extremities normal, atraumatic, no cyanosis or edema Skin warm and dry. Mucous membranes are moist  Lab Results:  Results for orders placed or performed during the hospital encounter of 06/20/16 (from the past 48 hour(s))  CBC with Differential     Status: Abnormal   Collection Time: 06/20/16 10:01 PM  Result Value Ref Range   WBC 11.3 (H) 4.0 - 10.5 K/uL   RBC 3.86 (L) 3.87 - 5.11 MIL/uL   Hemoglobin 9.7 (L) 12.0 - 15.0 g/dL   HCT 32.0 (L) 36.0 - 46.0 %   MCV 82.9 78.0 - 100.0 fL   MCH 25.1 (L) 26.0 - 34.0 pg   MCHC 30.3 30.0 - 36.0 g/dL   RDW 21.7 (H) 11.5 - 15.5 %   Platelets 263 150 - 400 K/uL   Neutrophils Relative % 82 %   Neutro Abs 9.2 (H) 1.7 - 7.7 K/uL   Lymphocytes Relative 11 %   Lymphs Abs 1.2 0.7 - 4.0 K/uL   Monocytes Relative 7 %   Monocytes Absolute 0.8 0.1 - 1.0 K/uL   Eosinophils Relative 0 %   Eosinophils Absolute 0.0 0.0 - 0.7 K/uL   Basophils Relative 0 %   Basophils Absolute 0.0 0.0 - 0.1 K/uL  Basic metabolic panel     Status: Abnormal   Collection Time: 06/20/16 10:01 PM  Result Value Ref Range   Sodium 137 135 - 145 mmol/L    Potassium 2.7 (LL) 3.5 - 5.1 mmol/L    Comment: CRITICAL RESULT CALLED TO, READ BACK BY AND VERIFIED WITH: LASHLEY,S. ON 2315 ON 06/20/2016 BY EVA    Chloride 100 (L) 101 - 111 mmol/L   CO2 28 22 - 32 mmol/L   Glucose, Bld 181 (H) 65 - 99 mg/dL   BUN 11 6 - 20 mg/dL   Creatinine, Ser 0.75 0.44 - 1.00 mg/dL   Calcium 8.7 (L) 8.9 - 10.3 mg/dL   GFR calc non Af Amer >60 >60 mL/min   GFR calc Af Amer >60 >60 mL/min    Comment: (NOTE) The eGFR has been calculated using the CKD EPI equation. This calculation has not been validated in all clinical situations. eGFR's persistently <60 mL/min signify possible Chronic Kidney Disease.    Anion gap 9 5 - 15  Magnesium     Status: None   Collection Time: 06/20/16 10:01 PM  Result Value Ref Range   Magnesium 1.7 1.7 - 2.4 mg/dL  Lactic acid, plasma     Status: None   Collection Time: 06/20/16 10:12 PM  Result Value Ref Range   Lactic Acid, Venous 1.6 0.5 - 1.9 mmol/L  Culture, blood (routine x 2)  Call MD if unable to obtain prior to antibiotics being given     Status: None (Preliminary result)   Collection Time: 06/21/16  4:57 AM  Result Value Ref Range   Specimen Description BLOOD LEFT HAND    Special Requests      BOTTLES DRAWN AEROBIC AND ANAEROBIC Blood Culture adequate volume   Culture NO GROWTH 1 DAY    Report Status PENDING   Basic metabolic panel     Status: Abnormal   Collection Time: 06/21/16  4:57 AM  Result Value Ref Range   Sodium 140 135 - 145 mmol/L   Potassium 2.8 (L) 3.5 - 5.1 mmol/L   Chloride 103 101 - 111 mmol/L   CO2 30 22 - 32 mmol/L   Glucose, Bld 153 (H) 65 - 99 mg/dL   BUN 10 6 - 20 mg/dL   Creatinine, Ser 0.56 0.44 - 1.00 mg/dL   Calcium 8.2 (L) 8.9 - 10.3 mg/dL   GFR calc non Af Amer >60 >60 mL/min   GFR calc Af Amer >60 >60 mL/min    Comment: (NOTE) The eGFR has been calculated using the CKD EPI equation. This calculation has not been validated in all clinical situations. eGFR's persistently <60 mL/min  signify possible Chronic Kidney Disease.    Anion gap 7 5 - 15  CBC WITH DIFFERENTIAL     Status: Abnormal   Collection Time: 06/21/16  4:57 AM  Result Value Ref Range   WBC 8.2 4.0 - 10.5 K/uL   RBC 3.29 (L) 3.87 - 5.11 MIL/uL   Hemoglobin 8.2 (L) 12.0 - 15.0 g/dL   HCT 28.2 (L) 36.0 - 46.0 %   MCV 85.7 78.0 - 100.0 fL   MCH 24.9 (L) 26.0 - 34.0 pg   MCHC 29.1 (L) 30.0 - 36.0 g/dL   RDW 21.6 (H) 11.5 - 15.5 %   Platelets 224 150 - 400 K/uL   Neutrophils Relative % 68 %   Neutro Abs 5.6 1.7 - 7.7 K/uL   Lymphocytes Relative 23 %   Lymphs Abs 1.9 0.7 - 4.0 K/uL   Monocytes Relative 9 %   Monocytes Absolute 0.7 0.1 - 1.0 K/uL   Eosinophils Relative 0 %   Eosinophils Absolute 0.0 0.0 - 0.7 K/uL   Basophils Relative 0 %   Basophils Absolute 0.0 0.0 - 0.1 K/uL  Culture, blood (routine x 2) Call MD if unable to obtain prior to antibiotics being given     Status: None (Preliminary result)   Collection Time: 06/21/16  5:58 AM  Result Value Ref Range   Specimen Description BLOOD LEFT ARM    Special Requests      BOTTLES DRAWN AEROBIC AND ANAEROBIC Blood Culture adequate volume   Culture NO GROWTH 1 DAY    Report Status PENDING   Glucose, capillary     Status: None   Collection Time: 06/21/16  8:08 AM  Result Value Ref Range   Glucose-Capillary 74 65 - 99 mg/dL  Glucose, capillary     Status: Abnormal   Collection Time: 06/21/16 11:00 AM  Result Value Ref Range   Glucose-Capillary 131 (H) 65 - 99 mg/dL  Glucose, capillary     Status: Abnormal   Collection Time: 06/21/16  4:07 PM  Result Value Ref Range   Glucose-Capillary 119 (H) 65 - 99 mg/dL  Glucose, capillary     Status: Abnormal   Collection Time: 06/21/16  9:40 PM  Result Value Ref Range   Glucose-Capillary 170 (H)  65 - 99 mg/dL   Comment 1 Notify RN    Comment 2 Document in Chart   Glucose, capillary     Status: Abnormal   Collection Time: 06/22/16  7:34 AM  Result Value Ref Range   Glucose-Capillary 160 (H) 65 -  99 mg/dL   Comment 1 Notify RN    Comment 2 Document in Chart     ABGS No results for input(s): PHART, PO2ART, TCO2, HCO3 in the last 72 hours.  Invalid input(s): PCO2 CULTURES Recent Results (from the past 240 hour(s))  Culture, blood (routine x 2) Call MD if unable to obtain prior to antibiotics being given     Status: None (Preliminary result)   Collection Time: 06/21/16  4:57 AM  Result Value Ref Range Status   Specimen Description BLOOD LEFT HAND  Final   Special Requests   Final    BOTTLES DRAWN AEROBIC AND ANAEROBIC Blood Culture adequate volume   Culture NO GROWTH 1 DAY  Final   Report Status PENDING  Incomplete  Culture, blood (routine x 2) Call MD if unable to obtain prior to antibiotics being given     Status: None (Preliminary result)   Collection Time: 06/21/16  5:58 AM  Result Value Ref Range Status   Specimen Description BLOOD LEFT ARM  Final   Special Requests   Final    BOTTLES DRAWN AEROBIC AND ANAEROBIC Blood Culture adequate volume   Culture NO GROWTH 1 DAY  Final   Report Status PENDING  Incomplete   Studies/Results: Dg Chest 2 View  Result Date: 06/20/2016 CLINICAL DATA:  56 year old female with fever and cough. EXAM: CHEST  2 VIEW COMPARISON:  Radiograph dated 01/24/2012 FINDINGS: Confluent area of airspace density in the right mid to lower lung field noted concerning for developing pneumonia, likely in the right middle lobe. There is no pleural effusion or pneumothorax. Stable top-normal cardiac size. No acute osseous pathology. IMPRESSION: Findings concerning for developing right middle lobe pneumonia. Clinical correlation and follow-up recommended. Electronically Signed   By: Anner Crete M.D.   On: 06/20/2016 23:23    Medications:  Prior to Admission:  Prescriptions Prior to Admission  Medication Sig Dispense Refill Last Dose  . albuterol (PROVENTIL HFA;VENTOLIN HFA) 108 (90 BASE) MCG/ACT inhaler Inhale 2 puffs into the lungs every 6 (six) hours as  needed for wheezing or shortness of breath.    06/20/2016 at Unknown time  . atorvastatin (LIPITOR) 40 MG tablet Take 40 mg by mouth at bedtime.   06/19/2016 at Unknown time  . cetirizine (ZYRTEC) 10 MG tablet Take 10 mg by mouth daily as needed for allergies.    06/20/2016 at Unknown time  . clonazePAM (KLONOPIN) 1 MG tablet Take 1 mg by mouth 3 (three) times daily as needed for anxiety.   06/20/2016 at Unknown time  . doxepin (SINEQUAN) 100 MG capsule Take 100 mg by mouth at bedtime.   06/19/2016 at Unknown time  . HYDROcodone-acetaminophen (NORCO) 10-325 MG per tablet Take 1 tablet by mouth every 6 (six) hours as needed for moderate pain.    06/20/2016 at 1700  . ibuprofen (ADVIL,MOTRIN) 800 MG tablet Take 800 mg by mouth every 8 (eight) hours as needed for mild pain or moderate pain.    06/20/2016 at 1500  . levothyroxine (SYNTHROID, LEVOTHROID) 75 MCG tablet Take 1 tablet (75 mcg total) by mouth daily before breakfast. 30 tablet 0 06/20/2016 at Unknown time  . lidocaine (XYLOCAINE) 5 % ointment Apply 1 application  topically 4 (four) times daily as needed for mild pain.   06/19/2016 at Unknown time  . lisinopril (PRINIVIL,ZESTRIL) 2.5 MG tablet Take 2.5 mg by mouth daily.   06/20/2016 at Unknown time  . metFORMIN (GLUCOPHAGE) 500 MG tablet Take 1 tablet (500 mg total) by mouth 2 (two) times daily with a meal. 60 tablet 0 06/20/2016 at Unknown time  . mometasone (NASONEX) 50 MCG/ACT nasal spray Place 2 sprays into the nose daily as needed (for rhinitis).   06/19/2016 at Unknown time  . naphazoline-glycerin (CLEAR EYES) 0.012-0.2 % SOLN Place 1-2 drops into both eyes every 4 (four) hours as needed for irritation.    Past Week at Unknown time  . omega-3 acid ethyl esters (LOVAZA) 1 g capsule Take 1 g by mouth daily.   06/20/2016 at Unknown time  . omeprazole (PRILOSEC) 40 MG capsule Take 1 capsule (40 mg total) by mouth daily. (Patient taking differently: Take 40 mg by mouth daily as needed (for acid reflux). ) 30  capsule 0 Past Week at Unknown time  . polyethylene glycol (MIRALAX / GLYCOLAX) packet Take 17 g by mouth daily as needed for mild constipation.   Past Month at Unknown time  . pregabalin (LYRICA) 300 MG capsule Take 1 capsule (300 mg total) by mouth 2 (two) times daily. 60 capsule 0 06/20/2016 at Unknown time  . rOPINIRole (REQUIP) 0.5 MG tablet Take 1 tablet (0.5 mg total) by mouth 3 (three) times daily. 90 tablet 0 06/20/2016 at Unknown time  . valACYclovir (VALTREX) 500 MG tablet Take 500 mg by mouth 2 (two) times daily.   06/20/2016 at Unknown time   Scheduled: . atorvastatin  40 mg Oral QHS  . guaiFENesin  1,200 mg Oral BID  . insulin aspart  0-9 Units Subcutaneous TID WC  . levothyroxine  75 mcg Oral QAC breakfast  . lisinopril  2.5 mg Oral Daily  . methylPREDNISolone (SOLU-MEDROL) injection  40 mg Intravenous Q12H  . pregabalin  300 mg Oral BID  . rOPINIRole  0.5 mg Oral TID  . valACYclovir  500 mg Oral BID   Continuous: . azithromycin Stopped (06/22/16 0120)  . cefTRIAXone (ROCEPHIN)  IV Stopped (06/21/16 2235)   HGD:JMEQASTMH, HYDROcodone-acetaminophen, ibuprofen  Assesment:She was admitted with community-acquired pneumonia and COPD exacerbation. She is much improved. I think she's ready for discharge. She had a drop in hemoglobin level that I think is somewhat delutional. Will recheck hemoglobin and potassium and while we're doing and go ahead and get iron studies done but I still think she could go home and finish the workup as an outpatient Principal Problem:   CAP (community acquired pneumonia) Active Problems:   Chronic airway obstruction ([place])   Anxiety   Diabetes mellitus ([place])   Acute respiratory failure with hypoxia ([place])   Hypokalemia    Plan: Discharge home today    LOS: 2 days   Akyla Vavrek L 06/22/2016, 8:43 AM

## 2016-06-22 NOTE — Progress Notes (Addendum)
SATURATION QUALIFICATIONS: (This note is used to comply with regulatory documentation for home oxygen)  Patient Saturations on Room Air at Rest = 78 % Patient Saturations on 2 L = 95%  Please briefly explain why patient needs home oxygen:

## 2016-06-22 NOTE — Care Management (Signed)
    Durable Medical Equipment        Start     Ordered   06/22/16 [phone]  For home use only DME oxygen  Once    Question Answer Comment  Mode or (Route) Nasal cannula   Liters per Minute 2   Frequency Continuous (stationary and portable oxygen unit needed)   Oxygen conserving device Yes   Oxygen delivery system Gas      06/22/16 0929

## 2016-06-23 NOTE — Discharge Summary (Signed)
Physician Discharge Summary  Patient ID: Patricia Bass MRN: [id number] DOB/AGE: 1960-08-11 56 y.o. Primary Care Physician:Adhrit Krenz, Ramon DredgeEdward, MD Admit date: 06/20/2016 Discharge date: 06/23/2016    Discharge Diagnoses:   Principal Problem:   CAP (community acquired pneumonia) Active Problems:   Chronic airway obstruction ([facility])   Anxiety   Diabetes mellitus ([facility])   Acute respiratory failure with hypoxia ([facility])   Hypokalemia   Allergies as of 06/22/2016      Reactions   Duragesic Disc Transdermal System [fentanyl] Nausea And Vomiting      Medication List    TAKE these medications   albuterol 108 (90 Base) MCG/ACT inhaler Commonly known as:  PROVENTIL HFA;VENTOLIN HFA Inhale 2 puffs into the lungs every 6 (six) hours as needed for wheezing or shortness of breath.   atorvastatin 40 MG tablet Commonly known as:  LIPITOR Take 40 mg by mouth at bedtime.   azithromycin 250 MG tablet Commonly known as:  ZITHROMAX Z-PAK Take 2 tablets (500 mg) on  Day 1,  followed by 1 tablet (250 mg) once daily on Days 2 through 5.   cefUROXime 250 MG tablet Commonly known as:  CEFTIN Take 1 tablet (250 mg total) by mouth 2 (two) times daily with a meal.   cetirizine 10 MG tablet Commonly known as:  ZYRTEC Take 10 mg by mouth daily as needed for allergies.   clonazePAM 1 MG tablet Commonly known as:  KLONOPIN Take 1 mg by mouth 3 (three) times daily as needed for anxiety.   doxepin 100 MG capsule Commonly known as:  SINEQUAN Take 100 mg by mouth at bedtime.   ferrous sulfate 325 (65 FE) MG tablet Take 1 tablet (325 mg total) by mouth daily.   guaiFENesin 600 MG 12 hr tablet Commonly known as:  MUCINEX Take 2 tablets (1,200 mg total) by mouth 2 (two) times daily.   HYDROcodone-acetaminophen 10-325 MG tablet Commonly known as:  NORCO Take 1 tablet by mouth every 6 (six) hours as needed for moderate pain.   ibuprofen 800 MG tablet Commonly known as:  ADVIL,MOTRIN Take 800 mg by  mouth every 8 (eight) hours as needed for mild pain or moderate pain.   levothyroxine 75 MCG tablet Commonly known as:  SYNTHROID, LEVOTHROID Take 1 tablet (75 mcg total) by mouth daily before breakfast.   lidocaine 5 % ointment Commonly known as:  XYLOCAINE Apply 1 application topically 4 (four) times daily as needed for mild pain.   lisinopril 2.5 MG tablet Commonly known as:  PRINIVIL,ZESTRIL Take 2.5 mg by mouth daily.   metFORMIN 500 MG tablet Commonly known as:  GLUCOPHAGE Take 1 tablet (500 mg total) by mouth 2 (two) times daily with a meal.   mometasone 50 MCG/ACT nasal spray Commonly known as:  NASONEX Place 2 sprays into the nose daily as needed (for rhinitis).   naphazoline-glycerin 0.012-0.2 % Soln Commonly known as:  CLEAR EYES Place 1-2 drops into both eyes every 4 (four) hours as needed for irritation.   omega-3 acid ethyl esters 1 g capsule Commonly known as:  LOVAZA Take 1 g by mouth daily.   omeprazole 40 MG capsule Commonly known as:  PRILOSEC Take 1 capsule (40 mg total) by mouth daily. What changed:  when to take this  reasons to take this   polyethylene glycol packet Commonly known as:  MIRALAX / GLYCOLAX Take 17 g by mouth daily as needed for mild constipation.   predniSONE 10 MG (21) Tbpk tablet Commonly known as:  STERAPRED UNI-PAK 21 TAB Take by package instructions   pregabalin 300 MG capsule Commonly known as:  LYRICA Take 1 capsule (300 mg total) by mouth 2 (two) times daily.   rOPINIRole 0.5 MG tablet Commonly known as:  REQUIP Take 1 tablet (0.5 mg total) by mouth 3 (three) times daily.   valACYclovir 500 MG tablet Commonly known as:  VALTREX Take 500 mg by mouth 2 (two) times daily.       Discharged Condition:Improved    Consults: None  Significant Diagnostic Studies: Dg Chest 2 View  Result Date: 06/20/2016 CLINICAL DATA:  56 year old female with fever and cough. EXAM: CHEST  2 VIEW COMPARISON:  Radiograph dated  01/24/2012 FINDINGS: Confluent area of airspace density in the right mid to lower lung field noted concerning for developing pneumonia, likely in the right middle lobe. There is no pleural effusion or pneumothorax. Stable top-normal cardiac size. No acute osseous pathology. IMPRESSION: Findings concerning for developing right middle lobe pneumonia. Clinical correlation and follow-up recommended. Electronically Signed   By: Elgie Collard M.D.   On: 06/20/2016 23:23    Lab Results: Basic Metabolic Panel:  Recent Labs  16/10/96 2201 06/21/16 0457 06/22/16 0926  NA 137 140 136  K 2.7* 2.8* 3.5  CL 100* 103 101  CO2 28 30 26   GLUCOSE 181* 153* 325*  BUN 11 10 10   CREATININE 0.75 0.56 0.54  CALCIUM 8.7* 8.2* 8.8*  MG 1.7  --   --    Liver Function Tests: No results for input(s): AST, ALT, ALKPHOS, BILITOT, PROT, ALBUMIN in the last 72 hours.   CBC:  Recent Labs  06/21/16 0457 06/22/16 0926  WBC 8.2 8.3  NEUTROABS 5.6 7.4  HGB 8.2* 9.0*  HCT 28.2* 30.1*  MCV 85.7 83.6  PLT 224 279    Recent Results (from the past 240 hour(s))  Culture, blood (routine x 2) Call MD if unable to obtain prior to antibiotics being given     Status: None (Preliminary result)   Collection Time: 06/21/16  4:57 AM  Result Value Ref Range Status   Specimen Description BLOOD LEFT HAND  Final   Special Requests   Final    BOTTLES DRAWN AEROBIC AND ANAEROBIC Blood Culture adequate volume   Culture NO GROWTH 1 DAY  Final   Report Status PENDING  Incomplete  Culture, blood (routine x 2) Call MD if unable to obtain prior to antibiotics being given     Status: None (Preliminary result)   Collection Time: 06/21/16  5:58 AM  Result Value Ref Range Status   Specimen Description BLOOD LEFT ARM  Final   Special Requests   Final    BOTTLES DRAWN AEROBIC AND ANAEROBIC Blood Culture adequate volume   Culture NO GROWTH 1 DAY  Final   Report Status PENDING  Incomplete     Hospital Course: This is a  56 year old with a long known history of COPD at baseline. She came to the emergency department because of increasing cough and congestion and was found to have right midlung pneumonia. She was started on IV antibiotics and IV steroids inhaled bronchodilators and oxygen. She improved over the next 48 hours to the point that she wanted to go home. Her oxygen level was such that she's going to require oxygen despite the use of the antibiotic steroids and bronchodilators. She's discharged home in improved condition.  Discharge Exam: Blood pressure (!) 141/71, pulse (!) 107, temperature 98 F (36.7 C), temperature source Oral, resp. rate  16, height 5' (1.524 m), weight 81.6 kg (180 lb), SpO2 94 %. She is awake and alert. Chest is clear now with some cough and congestion when she coughs. Her heart is regular.  Disposition: Home she will have home oxygen.  Discharge Instructions    Call MD for:  temperature >100.4    Complete by:  As directed    Diet - low sodium heart healthy    Complete by:  As directed    Increase activity slowly    Complete by:  As directed         Signed: Devyne Hauger L   06/23/2016, 8:46 AM

## 2016-06-26 LAB — CULTURE, BLOOD (ROUTINE X 2)
CULTURE: NO GROWTH
CULTURE: NO GROWTH
Special Requests: ADEQUATE
Special Requests: ADEQUATE

## 2016-07-05 ENCOUNTER — Encounter: Payer: Self-pay | Admitting: Internal Medicine

## 2016-07-06 ENCOUNTER — Emergency Department (HOSPITAL_COMMUNITY): Payer: Medicare Other

## 2016-07-06 ENCOUNTER — Inpatient Hospital Stay (HOSPITAL_COMMUNITY)
Admission: EM | Admit: 2016-07-06 | Discharge: 2016-07-08 | DRG: 602 | Disposition: A | Discharge status: Home / Self Care | Payer: Medicare Other | Attending: Pulmonary Disease | Admitting: Pulmonary Disease

## 2016-07-06 ENCOUNTER — Encounter (HOSPITAL_COMMUNITY): Payer: Self-pay

## 2016-07-06 DIAGNOSIS — J9622 Acute and chronic respiratory failure with hypercapnia: Secondary | ICD-10-CM | POA: Diagnosis present

## 2016-07-06 DIAGNOSIS — Z888 Allergy status to other drugs, medicaments and biological substances status: Secondary | ICD-10-CM

## 2016-07-06 DIAGNOSIS — E139 Other specified diabetes mellitus without complications: Secondary | ICD-10-CM | POA: Diagnosis not present

## 2016-07-06 DIAGNOSIS — B9620 Unspecified Escherichia coli [E. coli] as the cause of diseases classified elsewhere: Secondary | ICD-10-CM | POA: Diagnosis present

## 2016-07-06 DIAGNOSIS — E1165 Type 2 diabetes mellitus with hyperglycemia: Secondary | ICD-10-CM

## 2016-07-06 DIAGNOSIS — I2510 Atherosclerotic heart disease of native coronary artery without angina pectoris: Secondary | ICD-10-CM | POA: Diagnosis present

## 2016-07-06 DIAGNOSIS — M79676 Pain in unspecified toe(s): Secondary | ICD-10-CM | POA: Diagnosis not present

## 2016-07-06 DIAGNOSIS — L03039 Cellulitis of unspecified toe: Secondary | ICD-10-CM | POA: Diagnosis present

## 2016-07-06 DIAGNOSIS — J449 Chronic obstructive pulmonary disease, unspecified: Secondary | ICD-10-CM | POA: Diagnosis not present

## 2016-07-06 DIAGNOSIS — Z8249 Family history of ischemic heart disease and other diseases of the circulatory system: Secondary | ICD-10-CM | POA: Diagnosis not present

## 2016-07-06 DIAGNOSIS — N390 Urinary tract infection, site not specified: Secondary | ICD-10-CM

## 2016-07-06 DIAGNOSIS — Z79899 Other long term (current) drug therapy: Secondary | ICD-10-CM

## 2016-07-06 DIAGNOSIS — J9602 Acute respiratory failure with hypercapnia: Secondary | ICD-10-CM | POA: Diagnosis present

## 2016-07-06 DIAGNOSIS — K219 Gastro-esophageal reflux disease without esophagitis: Secondary | ICD-10-CM | POA: Diagnosis present

## 2016-07-06 DIAGNOSIS — M545 Low back pain, unspecified: Secondary | ICD-10-CM | POA: Diagnosis present

## 2016-07-06 DIAGNOSIS — J9601 Acute respiratory failure with hypoxia: Secondary | ICD-10-CM | POA: Diagnosis present

## 2016-07-06 DIAGNOSIS — L0390 Cellulitis, unspecified: Secondary | ICD-10-CM | POA: Diagnosis present

## 2016-07-06 DIAGNOSIS — J9621 Acute and chronic respiratory failure with hypoxia: Secondary | ICD-10-CM | POA: Diagnosis present

## 2016-07-06 DIAGNOSIS — E119 Type 2 diabetes mellitus without complications: Secondary | ICD-10-CM | POA: Diagnosis present

## 2016-07-06 DIAGNOSIS — R0902 Hypoxemia: Secondary | ICD-10-CM | POA: Diagnosis not present

## 2016-07-06 DIAGNOSIS — L03031 Cellulitis of right toe: Secondary | ICD-10-CM | POA: Diagnosis not present

## 2016-07-06 DIAGNOSIS — Z9981 Dependence on supplemental oxygen: Secondary | ICD-10-CM | POA: Diagnosis not present

## 2016-07-06 DIAGNOSIS — R42 Dizziness and giddiness: Secondary | ICD-10-CM | POA: Diagnosis not present

## 2016-07-06 DIAGNOSIS — E039 Hypothyroidism, unspecified: Secondary | ICD-10-CM | POA: Diagnosis present

## 2016-07-06 DIAGNOSIS — F17210 Nicotine dependence, cigarettes, uncomplicated: Secondary | ICD-10-CM | POA: Diagnosis present

## 2016-07-06 DIAGNOSIS — J9620 Acute and chronic respiratory failure, unspecified whether with hypoxia or hypercapnia: Secondary | ICD-10-CM | POA: Diagnosis present

## 2016-07-06 DIAGNOSIS — F419 Anxiety disorder, unspecified: Secondary | ICD-10-CM | POA: Diagnosis present

## 2016-07-06 DIAGNOSIS — J441 Chronic obstructive pulmonary disease with (acute) exacerbation: Secondary | ICD-10-CM | POA: Diagnosis present

## 2016-07-06 DIAGNOSIS — G8929 Other chronic pain: Secondary | ICD-10-CM | POA: Diagnosis present

## 2016-07-06 DIAGNOSIS — S91101A Unspecified open wound of right great toe without damage to nail, initial encounter: Secondary | ICD-10-CM | POA: Diagnosis not present

## 2016-07-06 LAB — LACTIC ACID, PLASMA: Lactic Acid, Venous: 2.1 mmol/L (ref 0.5–1.9)

## 2016-07-06 LAB — URINALYSIS, ROUTINE W REFLEX MICROSCOPIC
BILIRUBIN URINE: NEGATIVE
GLUCOSE, UA: NEGATIVE mg/dL
Hgb urine dipstick: NEGATIVE
KETONES UR: NEGATIVE mg/dL
NITRITE: POSITIVE — AB
PH: 5 (ref 5.0–8.0)
PROTEIN: NEGATIVE mg/dL
Specific Gravity, Urine: 1.009 (ref 1.005–1.030)

## 2016-07-06 LAB — CBC WITH DIFFERENTIAL/PLATELET
BASOS ABS: 0 10*3/uL (ref 0.0–0.1)
BASOS PCT: 0 %
EOS ABS: 0.1 10*3/uL (ref 0.0–0.7)
EOS PCT: 0 %
HEMATOCRIT: 35 % — AB (ref 36.0–46.0)
Hemoglobin: 10.2 g/dL — ABNORMAL LOW (ref 12.0–15.0)
Lymphocytes Relative: 18 %
Lymphs Abs: 3 10*3/uL (ref 0.7–4.0)
MCH: 24.9 pg — ABNORMAL LOW (ref 26.0–34.0)
MCHC: 29.1 g/dL — AB (ref 30.0–36.0)
MCV: 85.6 fL (ref 78.0–100.0)
MONO ABS: 0.9 10*3/uL (ref 0.1–1.0)
MONOS PCT: 5 %
Neutro Abs: 12.9 10*3/uL — ABNORMAL HIGH (ref 1.7–7.7)
Neutrophils Relative %: 77 %
PLATELETS: 284 10*3/uL (ref 150–400)
RBC: 4.09 MIL/uL (ref 3.87–5.11)
RDW: 21.8 % — AB (ref 11.5–15.5)
WBC: 16.9 10*3/uL — ABNORMAL HIGH (ref 4.0–10.5)

## 2016-07-06 LAB — BASIC METABOLIC PANEL
ANION GAP: 9 (ref 5–15)
BUN: 8 mg/dL (ref 6–20)
CALCIUM: 8.7 mg/dL — AB (ref 8.9–10.3)
CO2: 26 mmol/L (ref 22–32)
CREATININE: 0.66 mg/dL (ref 0.44–1.00)
Chloride: 97 mmol/L — ABNORMAL LOW (ref 101–111)
GFR calc Af Amer: >60 mL/min (ref >60)
GLUCOSE: 125 mg/dL — AB (ref 65–99)
Potassium: 4 mmol/L (ref 3.5–5.1)
Sodium: 132 mmol/L — ABNORMAL LOW (ref 135–145)

## 2016-07-06 MED ORDER — ACETAMINOPHEN 325 MG PO TABS
650.0000 mg | ORAL_TABLET | Freq: Once | ORAL | Status: AC
  Administered 2016-07-06: 650 mg via ORAL
  Filled 2016-07-06: qty 2

## 2016-07-06 MED ORDER — SODIUM CHLORIDE 0.9 % IV BOLUS (SEPSIS)
1000.0000 mL | Freq: Once | INTRAVENOUS | Status: AC
  Administered 2016-07-06: 1000 mL via INTRAVENOUS

## 2016-07-06 MED ORDER — DEXTROSE 5 % IV SOLN
2.0000 g | Freq: Once | INTRAVENOUS | Status: AC
  Administered 2016-07-06: 2 g via INTRAVENOUS
  Filled 2016-07-06: qty 2

## 2016-07-06 NOTE — ED Notes (Signed)
Date and time results received: 07/06/16 2312 (use smartphrase ".now" to insert current time)  Test: lactic acid Critical Value: 2.1  Name of Provider Notified: Dr. Hyacinth MeekerMiller  Orders Received? Or Actions Taken?: no/na

## 2016-07-06 NOTE — ED Triage Notes (Signed)
Pt reports chronic thoracic back pain and wound to great toe of right foot after injuring on a staple while removing carpet that occurred about 2 weeks ago. Pt denies SOB or chest pain.

## 2016-07-06 NOTE — ED Notes (Signed)
Patient transported to X-ray 

## 2016-07-06 NOTE — ED Provider Notes (Signed)
[facility]-EM[facility]Y DEPT Provider Note   CSN: [id number] Arrival date & time: 07/06/16  2123     History   Chief Complaint Chief Complaint  Patient presents with  . Wound Check  . Back Pain    HPI SHERRILL BUIKEMA is a 56 y.o. female.  HPI  SIEANNA VANSTONE is a 56 y.o. female with a history of chronic airway obstruction, anxiety, diabetes, and acute respiratory failure with hypoxia and was recently admitted to the hospital with community-acquired pneumonia and discharged on 06/23/2016 presents to the Emergency Department complaining of low back pain, generalized body aches and pain, redness and swelling of the right great toe that has been worsening for 2 weeks. She states that she "stumped her toe on a stable while trying to remove carpeting in her bedroom. She reports an injury to the tip of the great toe that she has been cleaning daily with peroxide.  She describes a throbbing pain to the toe and difficulty with weightbearing due to the level of pain. She also reports intermittent fever and chills today. Patient reports history of chronic COPD and requires continuous home oxygen. She states that she feels that her pneumonia has improved. She denies numbness of the right foot, red streaking, chest pain, shortness of breath, and vomiting  Past Medical History:  Diagnosis Date  . Anxiety   . Bulging disc   . Carpal tunnel syndrome   . Cervicalgia   . Chronic airway obstruction (HCC)   . COPD (chronic obstructive pulmonary disease) (HCC)   . Coronary artery disease   . Diabetes mellitus (HCC) 01/11/2012  . GERD (gastroesophageal reflux disease)   . Headache(784.0)   . Hypothyroid   . Lumbago   . Migraine   . Myalgia and myositis, unspecified   . Neuropathy   . Pneumonia   . Restless leg syndrome   . Shortness of breath   . Sleep apnea     Patient Active Problem List   Diagnosis Date Noted  . CAP (community acquired pneumonia) 06/20/2016  . Acute respiratory failure with hypoxia  (HCC) 06/20/2016  . Hypokalemia 06/20/2016  . Carpal tunnel syndrome   . Migraine   . Hypothyroid   . Myalgia and myositis, unspecified   . Lumbago   . Chronic airway obstruction (HCC)   . Cervicalgia   . Anxiety   . Diabetes mellitus (HCC) 01/11/2012  . S/P cubital tunnel release 04/14/2011  . Ulnar nerve compression 04/14/2011  . Pain in joint, forearm 04/14/2011    Past Surgical History:  Procedure Laterality Date  . BLADDER SUSPENSION    . CARDIAC CATHETERIZATION    . CHOLECYSTECTOMY    . OOPHORECTOMY    . TUBAL LIGATION    . ULNAR NERVE TRANSPOSITION  04/01/2011   Procedure: ULNAR NERVE DECOMPRESSION/TRANSPOSITION;  Surgeon: Dominica Severin, MD;  Location: [facility];  Service: Orthopedics;  Laterality: Left;  ULNAR NERVE RELEASE WITH ANTERIOR TRANS POSITION REPAIR RECONSTRUCTION AS NECESSARY    OB History    No data available       Home Medications    Prior to Admission medications   Medication Sig Start Date End Date Taking? Authorizing Provider  albuterol (PROVENTIL HFA;VENTOLIN HFA) 108 (90 BASE) MCG/ACT inhaler Inhale 2 puffs into the lungs every 6 (six) hours as needed for wheezing or shortness of breath.     [provider]  atorvastatin (LIPITOR) 40 MG tablet Take 40 mg by mouth at bedtime.    [provider]  cefUROXime (  CEFTIN) 250 MG tablet Take 1 tablet (250 mg total) by mouth 2 (two) times daily with a meal. 06/22/16   [name], MD  cetirizine (ZYRTEC) 10 MG tablet Take 10 mg by mouth daily as needed for allergies.     [name], MD  clonazePAM (KLONOPIN) 1 MG tablet Take 1 mg by mouth 3 (three) times daily as needed for anxiety.    [provider]  doxepin (SINEQUAN) 100 MG capsule Take 100 mg by mouth at bedtime.    [provider]  ferrous sulfate 325 (65 FE) MG tablet Take 1 tablet (325 mg total) by mouth daily. 06/22/16 06/22/17  [name], MD  guaiFENesin (MUCINEX) 600 MG 12 hr tablet Take 2 tablets  (1,200 mg total) by mouth 2 (two) times daily. 06/22/16   [name], MD  HYDROcodone-acetaminophen (NORCO) 10-325 MG per tablet Take 1 tablet by mouth every 6 (six) hours as needed for moderate pain.     [provider]  ibuprofen (ADVIL,MOTRIN) 800 MG tablet Take 800 mg by mouth every 8 (eight) hours as needed for mild pain or moderate pain.     [provider]  levothyroxine (SYNTHROID, LEVOTHROID) 75 MCG tablet Take 1 tablet (75 mcg total) by mouth daily before breakfast. 09/05/14   [name], MD  lidocaine (XYLOCAINE) 5 % ointment Apply 1 application topically 4 (four) times daily as needed for mild pain.    [provider]  lisinopril (PRINIVIL,ZESTRIL) 2.5 MG tablet Take 2.5 mg by mouth daily.    [provider]  metFORMIN (GLUCOPHAGE) 500 MG tablet Take 1 tablet (500 mg total) by mouth 2 (two) times daily with a meal. 09/05/14   [name], MD  mometasone (NASONEX) 50 MCG/ACT nasal spray Place 2 sprays into the nose daily as needed (for rhinitis).    [provider]  naphazoline-glycerin (CLEAR EYES) 0.012-0.2 % SOLN Place 1-2 drops into both eyes every 4 (four) hours as needed for irritation.     [provider]  omega-3 acid ethyl esters (LOVAZA) 1 g capsule Take 1 g by mouth daily.    [provider]  omeprazole (PRILOSEC) 40 MG capsule Take 1 capsule (40 mg total) by mouth daily. Patient taking differently: Take 40 mg by mouth daily as needed (for acid reflux).  09/05/14   [name], MD  polyethylene glycol [facility] Ethelene Hal) packet Take 17 g by mouth daily as needed for mild constipation.    [provider]  predniSONE (STERAPRED UNI-PAK 21 TAB) 10 MG (21) TBPK tablet Take by package instructions 06/22/16   [name], MD  pregabalin (LYRICA) 300 MG capsule Take 1 capsule (300 mg total) by mouth 2 (two) times daily. 09/05/14   [name], MD  rOPINIRole (REQUIP) 0.5 MG tablet Take 1  tablet (0.5 mg total) by mouth 3 (three) times daily. 09/05/14   [name], MD  valACYclovir (VALTREX) 500 MG tablet Take 500 mg by mouth 2 (two) times daily.    [name], MD    Family History No family history on file.  Social History Social History  Substance Use Topics  . Smoking status: Current Every Day Smoker    Packs/day: 2.00    Years: 20.00    Types: Cigarettes, E-cigarettes  . Smokeless tobacco: Never Used  . Alcohol use No     Allergies   Duragesic disc transdermal system [fentanyl]   Review of Systems Review of Systems  Constitutional: Positive for chills and fever.  Respiratory: Negative for cough, chest tightness and shortness of breath.   Cardiovascular: Negative for chest pain.  Gastrointestinal: Negative for abdominal pain, nausea and vomiting.  Musculoskeletal: Positive for arthralgias and joint swelling.  Skin: Positive for color change and wound.       redness and swelling of right great toe  Neurological: Negative for dizziness, weakness and numbness.  Psychiatric/Behavioral: Negative for confusion.  All other systems reviewed and are negative.    Physical Exam Updated Vital Signs BP 118/74 (BP Location: Right Arm)   Pulse (!) 120   Temp 100.3 F (37.9 C) (Oral)   Resp 18   Ht 5' (1.524 m)   Wt 81.6 kg (180 lb)   SpO2 (!) 84%   BMI 35.15 kg/m   Physical Exam  Constitutional: She is oriented to person, place, and time. She appears well-developed and well-nourished.  Pt appears disheveled   HENT:  Head: Atraumatic.  Mouth/Throat: Oropharynx is clear and moist.  Neck: Normal range of motion. Neck supple.  Cardiovascular: Regular rhythm and intact distal pulses.  Tachycardia present.   Pulmonary/Chest: Effort normal and breath sounds normal. No respiratory distress.  Abdominal: Soft. She exhibits no distension. There is no tenderness.  Musculoskeletal: She exhibits edema and tenderness.  Edema of the distal right foot and  toe.  Mild to moderate erythema of the right great toe and distal foot.  Scabbed wound to plantar surface of the distal toe.  No drainage.    Neurological: She is alert and oriented to person, place, and time. No sensory deficit.  Skin: Skin is warm. Capillary refill takes less than 2 seconds. There is erythema.  Nursing note and vitals reviewed.    ED Treatments / Results  Labs (all labs ordered are listed, but only abnormal results are displayed) Labs Reviewed  URINALYSIS, ROUTINE W REFLEX MICROSCOPIC - Abnormal; Notable for the following:       Result Value   APPearance HAZY (*)    Nitrite POSITIVE (*)    Leukocytes, UA SMALL (*)    Bacteria, UA RARE (*)    Squamous Epithelial / LPF 0-5 (*)    All other components within normal limits  BASIC METABOLIC PANEL - Abnormal; Notable for the following:    Sodium 132 (*)    Chloride 97 (*)    Glucose, Bld 125 (*)    Calcium 8.7 (*)    All other components within normal limits  CBC WITH DIFFERENTIAL/PLATELET - Abnormal; Notable for the following:    WBC 16.9 (*)    Hemoglobin 10.2 (*)    HCT 35.0 (*)    MCH 24.9 (*)    MCHC 29.1 (*)    RDW 21.8 (*)    Neutro Abs 12.9 (*)    All other components within normal limits  LACTIC ACID, PLASMA - Abnormal; Notable for the following:    Lactic Acid, Venous 2.1 (*)    All other components within normal limits  URINE CULTURE  LACTIC ACID, PLASMA    EKG  EKG Interpretation None       Radiology Dg Toe Great Right  Result Date: 07/06/2016 CLINICAL DATA:  Right great toe wound status post staple injury 2 weeks ago. EXAM: RIGHT GREAT TOE COMPARISON:  None. FINDINGS: There is no evidence of fracture or dislocation. There is no evidence of arthropathy or other focal bone abnormality. Soft tissue defect along the plantar aspect of the great toe is noted consistent with superficial abrasion or ulceration. IMPRESSION: Soft tissue  ulceration or abrasion along the plantar aspect of the right  great toe. No underlying fracture or bone destruction is seen. Electronically Signed   By: Tollie Ethavid  Kwon M.D.   On: 07/06/2016 23:17    Procedures Procedures (including critical care time)  Medications Ordered in ED Medications  acetaminophen (TYLENOL) tablet 650 mg (650 mg Oral Given 07/06/16 2215)  sodium chloride 0.9 % bolus 1,000 mL (0 mLs Intravenous Stopped 07/06/16 2345)  ceFEPIme (MAXIPIME) 2 g in dextrose 5 % 50 mL IVPB (0 g Intravenous Stopped 07/07/16 0018)   CRITICAL CARE Performed by: Betsie Peckman L. Total critical care time: 30 minutes Critical care time was exclusive of separately billable procedures and treating other patients. Critical care was necessary to treat or prevent imminent or life-threatening deterioration. Critical care was time spent personally by me on the following activities: development of treatment plan with patient and/or surrogate as well as nursing, discussions with consultants, evaluation of patient's response to treatment, examination of patient, obtaining history from patient or surrogate, ordering and performing treatments and interventions, ordering and review of laboratory studies, ordering and review of radiographic studies, pulse oximetry and re-evaluation of patient's condition.    Initial Impression / Assessment and Plan / ED Course  I have reviewed the triage vital signs and the nursing notes.  Pertinent labs & imaging results that were available during my care of the patient were reviewed by me and considered in my medical decision making (see chart for details).      Pt O2 sat of 84 % on arrival.  Pt supposed to be on continuous home O2.  sats quickly rose to 90% on 2 L O2 by [place].  Speaking in full clear sentences w/o respiratory distress.  Likely cellulitis of the toe.  Febrile.  Will obtain labs, XR, and likely require admission.   Pt also seen by Dr. Hyacinth MeekerMiller and care plan discussed to include admission.     [id number] Consulted Dr. Sharl MaLama.  Agrees  to admit.    Final Clinical Impressions(s) / ED Diagnoses   Final diagnoses:  Cellulitis of toe of right foot  Urinary tract infection in female    New Prescriptions New Prescriptions   No medications on file     Pauline Ausriplett, Kathrina Crosley, Cordelia Poche-C 07/07/16 0125    Eber HongMiller, Brian, MD 07/08/16 [phone]

## 2016-07-07 ENCOUNTER — Encounter (HOSPITAL_COMMUNITY): Payer: Self-pay | Admitting: *Deleted

## 2016-07-07 DIAGNOSIS — L03039 Cellulitis of unspecified toe: Secondary | ICD-10-CM | POA: Diagnosis not present

## 2016-07-07 DIAGNOSIS — K219 Gastro-esophageal reflux disease without esophagitis: Secondary | ICD-10-CM | POA: Diagnosis present

## 2016-07-07 DIAGNOSIS — M545 Low back pain: Secondary | ICD-10-CM | POA: Diagnosis present

## 2016-07-07 DIAGNOSIS — L0390 Cellulitis, unspecified: Secondary | ICD-10-CM | POA: Diagnosis present

## 2016-07-07 DIAGNOSIS — N390 Urinary tract infection, site not specified: Secondary | ICD-10-CM | POA: Diagnosis not present

## 2016-07-07 DIAGNOSIS — E139 Other specified diabetes mellitus without complications: Secondary | ICD-10-CM | POA: Diagnosis not present

## 2016-07-07 DIAGNOSIS — Z79899 Other long term (current) drug therapy: Secondary | ICD-10-CM | POA: Diagnosis not present

## 2016-07-07 DIAGNOSIS — J449 Chronic obstructive pulmonary disease, unspecified: Secondary | ICD-10-CM

## 2016-07-07 DIAGNOSIS — Z9981 Dependence on supplemental oxygen: Secondary | ICD-10-CM | POA: Diagnosis not present

## 2016-07-07 DIAGNOSIS — I2510 Atherosclerotic heart disease of native coronary artery without angina pectoris: Secondary | ICD-10-CM | POA: Diagnosis present

## 2016-07-07 DIAGNOSIS — L03031 Cellulitis of right toe: Principal | ICD-10-CM

## 2016-07-07 DIAGNOSIS — J9621 Acute and chronic respiratory failure with hypoxia: Secondary | ICD-10-CM | POA: Diagnosis not present

## 2016-07-07 DIAGNOSIS — F419 Anxiety disorder, unspecified: Secondary | ICD-10-CM | POA: Diagnosis present

## 2016-07-07 DIAGNOSIS — J9622 Acute and chronic respiratory failure with hypercapnia: Secondary | ICD-10-CM | POA: Diagnosis not present

## 2016-07-07 DIAGNOSIS — B9620 Unspecified Escherichia coli [E. coli] as the cause of diseases classified elsewhere: Secondary | ICD-10-CM | POA: Diagnosis present

## 2016-07-07 DIAGNOSIS — Z888 Allergy status to other drugs, medicaments and biological substances status: Secondary | ICD-10-CM | POA: Diagnosis not present

## 2016-07-07 DIAGNOSIS — E039 Hypothyroidism, unspecified: Secondary | ICD-10-CM | POA: Diagnosis not present

## 2016-07-07 DIAGNOSIS — E119 Type 2 diabetes mellitus without complications: Secondary | ICD-10-CM | POA: Diagnosis present

## 2016-07-07 DIAGNOSIS — F17210 Nicotine dependence, cigarettes, uncomplicated: Secondary | ICD-10-CM | POA: Diagnosis present

## 2016-07-07 DIAGNOSIS — Z8249 Family history of ischemic heart disease and other diseases of the circulatory system: Secondary | ICD-10-CM | POA: Diagnosis not present

## 2016-07-07 LAB — COMPREHENSIVE METABOLIC PANEL
ALK PHOS: 94 U/L (ref 38–126)
ALT: 32 U/L (ref 14–54)
ANION GAP: 8 (ref 5–15)
AST: 39 U/L (ref 15–41)
Albumin: 3.1 g/dL — ABNORMAL LOW (ref 3.5–5.0)
BILIRUBIN TOTAL: 0.4 mg/dL (ref 0.3–1.2)
BUN: 8 mg/dL (ref 6–20)
CO2: 27 mmol/L (ref 22–32)
Calcium: 8.1 mg/dL — ABNORMAL LOW (ref 8.9–10.3)
Chloride: 101 mmol/L (ref 101–111)
Creatinine, Ser: 0.55 mg/dL (ref 0.44–1.00)
GFR calc non Af Amer: >60 mL/min (ref >60)
Glucose, Bld: 101 mg/dL — ABNORMAL HIGH (ref 65–99)
Potassium: 3.6 mmol/L (ref 3.5–5.1)
SODIUM: 136 mmol/L (ref 135–145)
Total Protein: 6.7 g/dL (ref 6.5–8.1)

## 2016-07-07 LAB — BLOOD GAS, ARTERIAL
ALLENS TEST (PASS/FAIL): POSITIVE — AB
Acid-Base Excess: 2.3 mmol/L — ABNORMAL HIGH (ref 0.0–2.0)
Bicarbonate: 25.8 mmol/L (ref 20.0–28.0)
Drawn by: 382351
FIO2: 28
O2 Saturation: 89.6 %
PCO2 ART: 57 mmHg — AB (ref 32.0–48.0)
PH ART: 7.312 — AB (ref 7.350–7.450)
Patient temperature: 37
pO2, Arterial: 66 mmHg — ABNORMAL LOW (ref 83.0–108.0)

## 2016-07-07 LAB — GLUCOSE, CAPILLARY
GLUCOSE-CAPILLARY: 99 mg/dL (ref 65–99)
GLUCOSE-CAPILLARY: 118 mg/dL — AB (ref 65–99)
Glucose-Capillary: 88 mg/dL (ref 65–99)
Glucose-Capillary: 117 mg/dL — ABNORMAL HIGH (ref 65–99)
Glucose-Capillary: 104 mg/dL — ABNORMAL HIGH (ref 65–99)

## 2016-07-07 LAB — CBC
HCT: 30.8 % — ABNORMAL LOW (ref 36.0–46.0)
HEMOGLOBIN: 9.2 g/dL — AB (ref 12.0–15.0)
MCH: 25.6 pg — AB (ref 26.0–34.0)
MCHC: 29.9 g/dL — AB (ref 30.0–36.0)
MCV: 85.8 fL (ref 78.0–100.0)
Platelets: 248 10*3/uL (ref 150–400)
RBC: 3.59 MIL/uL — AB (ref 3.87–5.11)
RDW: 21.7 % — ABNORMAL HIGH (ref 11.5–15.5)
WBC: 11.9 10*3/uL — ABNORMAL HIGH (ref 4.0–10.5)

## 2016-07-07 LAB — LACTIC ACID, PLASMA: Lactic Acid, Venous: 1.1 mmol/L (ref 0.5–1.9)

## 2016-07-07 LAB — MRSA PCR SCREENING: MRSA BY PCR: NEGATIVE

## 2016-07-07 LAB — HIV ANTIBODY (ROUTINE TESTING W REFLEX): HIV Screen 4th Generation wRfx: NONREACTIVE

## 2016-07-07 MED ORDER — ACETAMINOPHEN 325 MG PO TABS: 650.0000 mg | ORAL_TABLET | Freq: Four times a day (QID) | ORAL | Status: DC | PRN

## 2016-07-07 MED ORDER — CEFEPIME HCL 2 G IJ SOLR
2.0000 g | Freq: Once | INTRAMUSCULAR | Status: AC
  Administered 2016-07-07: 2 g via INTRAVENOUS
  Filled 2016-07-07: qty 2

## 2016-07-07 MED ORDER — GUAIFENESIN ER 600 MG PO TB12
1200.0000 mg | ORAL_TABLET | Freq: Two times a day (BID) | ORAL | Status: DC
Start: 2016-07-07 — End: 2016-07-08
  Administered 2016-07-07 – 2016-07-08 (×3): 1200 mg via ORAL
  Filled 2016-07-07 (×3): qty 2

## 2016-07-07 MED ORDER — ALBUTEROL SULFATE (2.5 MG/3ML) 0.083% IN NEBU: 3.0000 mL | INHALATION_SOLUTION | Freq: Four times a day (QID) | RESPIRATORY_TRACT | Status: DC | PRN

## 2016-07-07 MED ORDER — ATORVASTATIN CALCIUM 40 MG PO TABS
40.0000 mg | ORAL_TABLET | Freq: Every day | ORAL | Status: DC
Start: 2016-07-07 — End: 2016-07-08
  Administered 2016-07-07: 40 mg via ORAL
  Filled 2016-07-07: qty 1

## 2016-07-07 MED ORDER — SODIUM CHLORIDE 0.9 % IV BOLUS (SEPSIS)
500.0000 mL | Freq: Once | INTRAVENOUS | Status: AC
  Administered 2016-07-07: 500 mL via INTRAVENOUS

## 2016-07-07 MED ORDER — ONDANSETRON HCL 4 MG PO TABS: 4.0000 mg | ORAL_TABLET | Freq: Four times a day (QID) | ORAL | Status: DC | PRN

## 2016-07-07 MED ORDER — VANCOMYCIN HCL IN DEXTROSE 1-5 GM/200ML-% IV SOLN
1000.0000 mg | Freq: Two times a day (BID) | INTRAVENOUS | Status: DC
  Administered 2016-07-07 – 2016-07-08 (×2): 1000 mg via INTRAVENOUS
  Filled 2016-07-07 (×2): qty 200

## 2016-07-07 MED ORDER — DOXEPIN HCL 50 MG PO CAPS
100.0000 mg | ORAL_CAPSULE | Freq: Every day | ORAL | Status: DC
Start: 2016-07-07 — End: 2016-07-08
  Administered 2016-07-07: 100 mg via ORAL
  Filled 2016-07-07: qty 2

## 2016-07-07 MED ORDER — LEVOTHYROXINE SODIUM 75 MCG PO TABS
75.0000 ug | ORAL_TABLET | Freq: Every day | ORAL | Status: DC
  Administered 2016-07-07 – 2016-07-08 (×2): 75 ug via ORAL
  Filled 2016-07-07 (×2): qty 1

## 2016-07-07 MED ORDER — VALACYCLOVIR HCL 500 MG PO TABS
500.0000 mg | ORAL_TABLET | Freq: Two times a day (BID) | ORAL | Status: DC
  Administered 2016-07-07 – 2016-07-08 (×3): 500 mg via ORAL
  Filled 2016-07-07 (×5): qty 1

## 2016-07-07 MED ORDER — VANCOMYCIN HCL IN DEXTROSE 1-5 GM/200ML-% IV SOLN
1000.0000 mg | Freq: Once | INTRAVENOUS | Status: AC
  Administered 2016-07-07: 1000 mg via INTRAVENOUS
  Filled 2016-07-07: qty 200

## 2016-07-07 MED ORDER — PREGABALIN 75 MG PO CAPS
300.0000 mg | ORAL_CAPSULE | Freq: Two times a day (BID) | ORAL | Status: DC
  Administered 2016-07-07 – 2016-07-08 (×3): 300 mg via ORAL
  Filled 2016-07-07 (×2): qty 4
  Filled 2016-07-07: qty 12

## 2016-07-07 MED ORDER — DEXTROSE 5 % IV SOLN
2.0000 g | Freq: Two times a day (BID) | INTRAVENOUS | Status: DC
  Administered 2016-07-07 – 2016-07-08 (×2): 2 g via INTRAVENOUS
  Filled 2016-07-07 (×4): qty 2

## 2016-07-07 MED ORDER — LORATADINE 10 MG PO TABS
10.0000 mg | ORAL_TABLET | Freq: Every day | ORAL | Status: DC
  Administered 2016-07-07 – 2016-07-08 (×2): 10 mg via ORAL
  Filled 2016-07-07 (×2): qty 1

## 2016-07-07 MED ORDER — NAPHAZOLINE-GLYCERIN 0.012-0.2 % OP SOLN
1.0000 [drp] | OPHTHALMIC | Status: DC | PRN
  Filled 2016-07-07: qty 15

## 2016-07-07 MED ORDER — POLYETHYLENE GLYCOL 3350 17 G PO PACK: 17.0000 g | PACK | Freq: Every day | ORAL | Status: DC | PRN

## 2016-07-07 MED ORDER — FERROUS SULFATE 325 (65 FE) MG PO TABS
325.0000 mg | ORAL_TABLET | Freq: Every day | ORAL | Status: DC
  Administered 2016-07-07 – 2016-07-08 (×2): 325 mg via ORAL
  Filled 2016-07-07 (×2): qty 1

## 2016-07-07 MED ORDER — HYDROCODONE-ACETAMINOPHEN 10-325 MG PO TABS
1.0000 | ORAL_TABLET | Freq: Four times a day (QID) | ORAL | Status: DC | PRN
Start: 2016-07-07 — End: 2016-07-08
  Administered 2016-07-07 – 2016-07-08 (×3): 1 via ORAL
  Filled 2016-07-07 (×4): qty 1

## 2016-07-07 MED ORDER — SODIUM CHLORIDE 0.9 % IV SOLN
INTRAVENOUS | Status: DC
  Administered 2016-07-07 – 2016-07-08 (×4): via INTRAVENOUS

## 2016-07-07 MED ORDER — ENOXAPARIN SODIUM 40 MG/0.4ML ~~LOC~~ SOLN
40.0000 mg | SUBCUTANEOUS | Status: DC
  Administered 2016-07-07 – 2016-07-08 (×2): 40 mg via SUBCUTANEOUS
  Filled 2016-07-07 (×2): qty 0.4

## 2016-07-07 MED ORDER — CLONAZEPAM 0.5 MG PO TABS
1.0000 mg | ORAL_TABLET | Freq: Three times a day (TID) | ORAL | Status: DC | PRN
  Administered 2016-07-08: 1 mg via ORAL
  Filled 2016-07-07: qty 2

## 2016-07-07 MED ORDER — ACETAMINOPHEN 650 MG RE SUPP: 650.0000 mg | Freq: Four times a day (QID) | RECTAL | Status: DC | PRN

## 2016-07-07 MED ORDER — ONDANSETRON HCL 4 MG/2ML IJ SOLN: 4.0000 mg | Freq: Four times a day (QID) | INTRAMUSCULAR | Status: DC | PRN

## 2016-07-07 MED ORDER — PANTOPRAZOLE SODIUM 40 MG PO TBEC
40.0000 mg | DELAYED_RELEASE_TABLET | Freq: Every day | ORAL | Status: DC
  Administered 2016-07-07 – 2016-07-08 (×2): 40 mg via ORAL
  Filled 2016-07-07 (×2): qty 1

## 2016-07-07 MED ORDER — IBUPROFEN 800 MG PO TABS
800.0000 mg | ORAL_TABLET | Freq: Three times a day (TID) | ORAL | Status: DC | PRN
  Administered 2016-07-07 – 2016-07-08 (×2): 800 mg via ORAL
  Filled 2016-07-07 (×2): qty 1

## 2016-07-07 MED ORDER — ROPINIROLE HCL 1 MG PO TABS
0.5000 mg | ORAL_TABLET | Freq: Three times a day (TID) | ORAL | Status: DC
  Administered 2016-07-07 – 2016-07-08 (×4): 0.5 mg via ORAL
  Filled 2016-07-07 (×6): qty 0.5
  Filled 2016-07-07: qty 1

## 2016-07-07 MED ORDER — INSULIN ASPART 100 UNIT/ML ~~LOC~~ SOLN
0.0000 [IU] | Freq: Three times a day (TID) | SUBCUTANEOUS | Status: DC
  Administered 2016-07-08: 1 [IU] via SUBCUTANEOUS

## 2016-07-07 NOTE — ED Notes (Signed)
Pt given crackers.  

## 2016-07-07 NOTE — ED Provider Notes (Signed)
The patient is a 56 year old female, she is a chronic smoker, she is supposed to be on chronic oxygen however she states that she does not use it. She presents this evening with increased pain and redness of her toe, she feels febrile, she has an elevated white blood cell count and a lactic acid 2.1. On exam she is tachycardic to 110, she has a soft systolic murmur, she has lung sounds with intermittent wheezing but is able to speak in full sentences. Abdomen is soft and nontender, no significant peripheral edema but she does have a red swollen toe as well as warmth swelling and tenderness around the foot as well. There is no subcutaneous emphysema, the patient likely has a significant skin soft tissue infection will likely need to be admitted to the hospital. Broad spectrum and a box were ordered as the patient likely has sepsis.  Medical screening examination/treatment/procedure(s) were conducted as a shared visit with non-physician practitioner(s) and myself.  I personally evaluated the patient during the encounter.  Clinical Impression:   Final diagnoses:  Cellulitis of toe of right foot  Urinary tract infection in female         Eber HongMiller, Joah Patlan, MD 07/08/16 [phone]

## 2016-07-07 NOTE — Progress Notes (Signed)
Report was given to ICU RN. Questions answered. Patient transferred to ICU via bed by this RN and Lowella Dandylare, RN with no complications..[name]

## 2016-07-07 NOTE — H&P (Signed)
TRH H&P    Patient Demographics:    Patricia Bass, is a 56 y.o. female  MRN: [id number]  DOB - Aug 20, 1960  Admit Date - 07/06/2016  Referring MD/NP/PA: Dr. Hyacinth Meeker  Outpatient Primary MD for the patient is [name], MD  Patient coming from: Home  Chief Complaint  Patient presents with  . Wound Check  . Back Pain      HPI:    Patricia Bass  is a 56 y.o. female,With history of COPD on home oxygen, anxiety, diabetes mellitus, who was recently admitted to hospital with chordee acquired pneumonia and discharged on 06/23/2016 came to hospital with generous body aches with worsening redness and swelling of right great toe for past 2 weeks. Patient says that she stumbled on the staple while removing the carpet in her bedroom. She has been cleaned daily with peroxide. Patient has been having difficulty with weightbearing no with intermittent fever and chills. In the ED, lab work showed WBC 16.9, initial lactic acid 2.1 and repeat lactate 1.1. UA showed positive nitrite.  Patient denies chest pain, no cough. No nausea vomiting or diarrhea.    Review of systems:      All other systems reviewed and are negative.   With Past History of the following :    Past Medical History:  Diagnosis Date  . Anxiety   . Bulging disc   . Carpal tunnel syndrome   . Cervicalgia   . Chronic airway obstruction (HCC)   . COPD (chronic obstructive pulmonary disease) (HCC)   . Coronary artery disease   . Diabetes mellitus (HCC) 01/11/2012  . GERD (gastroesophageal reflux disease)   . Headache(784.0)   . Hypothyroid   . Lumbago   . Migraine   . Myalgia and myositis, unspecified   . Neuropathy   . Pneumonia   . Restless leg syndrome   . Shortness of breath   . Sleep apnea       Past Surgical History:  Procedure Laterality Date  . BLADDER SUSPENSION    . CARDIAC CATHETERIZATION    . CHOLECYSTECTOMY    .  OOPHORECTOMY    . TUBAL LIGATION    . ULNAR NERVE TRANSPOSITION  04/01/2011   Procedure: ULNAR NERVE DECOMPRESSION/TRANSPOSITION;  Surgeon: Dominica Severin, MD;  Location: [facility] OR;  Service: Orthopedics;  Laterality: Left;  ULNAR NERVE RELEASE WITH ANTERIOR TRANS POSITION REPAIR RECONSTRUCTION AS NECESSARY      Social History:      Social History  Substance Use Topics  . Smoking status: Current Every Day Smoker    Packs/day: 2.00    Years: 20.00    Types: Cigarettes, E-cigarettes  . Smokeless tobacco: Never Used  . Alcohol use No       Family History :   Patient's father had CAD   Home Medications:   Prior to Admission medications   Medication Sig Start Date End Date Taking? Authorizing Provider  albuterol (PROVENTIL HFA;VENTOLIN HFA) 108 (90 BASE) MCG/ACT inhaler Inhale 2 puffs into the lungs every 6 (six) hours as needed for wheezing  or shortness of breath.     [provider]  atorvastatin (LIPITOR) 40 MG tablet Take 40 mg by mouth at bedtime.    [provider]  cefUROXime (CEFTIN) 250 MG tablet Take 1 tablet (250 mg total) by mouth 2 (two) times daily with a meal. 06/22/16   [name], MD  cetirizine (ZYRTEC) 10 MG tablet Take 10 mg by mouth daily as needed for allergies.     [name], MD  clonazePAM (KLONOPIN) 1 MG tablet Take 1 mg by mouth 3 (three) times daily as needed for anxiety.    [provider]  doxepin (SINEQUAN) 100 MG capsule Take 100 mg by mouth at bedtime.    [provider]  ferrous sulfate 325 (65 FE) MG tablet Take 1 tablet (325 mg total) by mouth daily. 06/22/16 06/22/17  [name], MD  guaiFENesin (MUCINEX) 600 MG 12 hr tablet Take 2 tablets (1,200 mg total) by mouth 2 (two) times daily. 06/22/16   [name], MD  HYDROcodone-acetaminophen (NORCO) 10-325 MG per tablet Take 1 tablet by mouth every 6 (six) hours as needed for moderate pain.     [provider]  ibuprofen (ADVIL,MOTRIN) 800 MG  tablet Take 800 mg by mouth every 8 (eight) hours as needed for mild pain or moderate pain.     [provider]  levothyroxine (SYNTHROID, LEVOTHROID) 75 MCG tablet Take 1 tablet (75 mcg total) by mouth daily before breakfast. 09/05/14   [name], MD  lidocaine (XYLOCAINE) 5 % ointment Apply 1 application topically 4 (four) times daily as needed for mild pain.    [provider]  lisinopril (PRINIVIL,ZESTRIL) 2.5 MG tablet Take 2.5 mg by mouth daily.    [provider]  metFORMIN (GLUCOPHAGE) 500 MG tablet Take 1 tablet (500 mg total) by mouth 2 (two) times daily with a meal. 09/05/14   [name], MD  mometasone (NASONEX) 50 MCG/ACT nasal spray Place 2 sprays into the nose daily as needed (for rhinitis).    [provider]  naphazoline-glycerin (CLEAR EYES) 0.012-0.2 % SOLN Place 1-2 drops into both eyes every 4 (four) hours as needed for irritation.     [provider]  omega-3 acid ethyl esters (LOVAZA) 1 g capsule Take 1 g by mouth daily.    [provider]  omeprazole (PRILOSEC) 40 MG capsule Take 1 capsule (40 mg total) by mouth daily. Patient taking differently: Take 40 mg by mouth daily as needed (for acid reflux).  09/05/14   [name], MD  polyethylene glycol [facility] Ethelene Hal) packet Take 17 g by mouth daily as needed for mild constipation.    [provider]  predniSONE (STERAPRED UNI-PAK 21 TAB) 10 MG (21) TBPK tablet Take by package instructions 06/22/16   [name], MD  pregabalin (LYRICA) 300 MG capsule Take 1 capsule (300 mg total) by mouth 2 (two) times daily. 09/05/14   [name], MD  rOPINIRole (REQUIP) 0.5 MG tablet Take 1 tablet (0.5 mg total) by mouth 3 (three) times daily. 09/05/14   [name], MD  valACYclovir (VALTREX) 500 MG tablet Take 500 mg by mouth 2 (two) times daily.    [name], MD     Allergies:     Allergies  Allergen Reactions  . Duragesic Disc  Transdermal System [Fentanyl] Nausea And Vomiting     Physical Exam:   Vitals  Blood pressure 105/73, pulse 97, temperature 99.2 F (37.3 C), temperature source Oral, resp. rate 14, height  5' (1.524 m), weight 81.6 kg (180 lb), SpO2 93 %.  1.  General: Appears in no acute distress  2. Psychiatric:  Intact judgement and  insight, awake alert, oriented x 3.  3. Neurologic: No focal neurological deficits, all cranial nerves intact.Strength 5/5 all 4 extremities, sensation intact all 4 extremities, plantars down going.  4. Eyes :  anicteric sclerae, moist conjunctivae with no lid lag. PERRLA.  5. ENMT:  Oropharynx clear with moist mucous membranes and good dentition  6. Neck:  supple, no cervical lymphadenopathy appriciated, No thyromegaly  7. Respiratory : Normal respiratory effort, good air movement bilaterally,clear to  auscultation bilaterally  8. Cardiovascular : RRR, no gallops, rubs or murmurs, no leg edema  9. Gastrointestinal:  Positive bowel sounds, abdomen soft, non-tender to palpation,no hepatosplenomegaly, no rigidity or guarding       10. Skin:  Right great toe erythematous, warm to touch mild edema      Data Review:    CBC  Recent Labs Lab 07/06/16 2217  WBC 16.9*  HGB 10.2*  HCT 35.0*  PLT 284  MCV 85.6  MCH 24.9*  MCHC 29.1*  RDW 21.8*  LYMPHSABS 3.0  MONOABS 0.9  EOSABS 0.1  BASOSABS 0.0   ------------------------------------------------------------------------------------------------------------------  Chemistries   Recent Labs Lab 07/06/16 2217  NA 132*  K 4.0  CL 97*  CO2 26  GLUCOSE 125*  BUN 8  CREATININE 0.66  CALCIUM 8.7*   ------------------------------------------------------------------------------------------------------------------  ------------------------------------------------------------------------------------------------------------------ GFR: Estimated Creatinine Clearance: 73.4 mL/min (by C-G formula  based on SCr of 0.66 mg/dL). Liver Function Tests: No results for input(s): AST, ALT, ALKPHOS, BILITOT, PROT, ALBUMIN in the last 168 hours. No results for input(s): LIPASE, AMYLASE in the last 168 hours. No results for input(s): AMMONIA in the last 168 hours. Coagulation Profile: No results for input(s): INR, PROTIME in the last 168 hours. Cardiac Enzymes: No results for input(s): CKTOTAL, CKMB, CKMBINDEX, TROPONINI in the last 168 hours. BNP (last 3 results) No results for input(s): PROBNP in the last 8760 hours. HbA1C: No results for input(s): HGBA1C in the last 72 hours. CBG: No results for input(s): GLUCAP in the last 168 hours. Lipid Profile: No results for input(s): CHOL, HDL, LDLCALC, TRIG, CHOLHDL, LDLDIRECT in the last 72 hours. Thyroid Function Tests: No results for input(s): TSH, T4TOTAL, FREET4, T3FREE, THYROIDAB in the last 72 hours. Anemia Panel: No results for input(s): VITAMINB12, FOLATE, FERRITIN, TIBC, IRON, RETICCTPCT in the last 72 hours.  --------------------------------------------------------------------------------------------------------------- Urine analysis:    Component Value Date/Time   COLORURINE YELLOW 07/06/2016 2206   APPEARANCEUR HAZY (A) 07/06/2016 2206   LABSPEC 1.009 07/06/2016 2206   PHURINE 5.0 07/06/2016 2206   GLUCOSEU NEGATIVE 07/06/2016 2206   HGBUR NEGATIVE 07/06/2016 2206   BILIRUBINUR NEGATIVE 07/06/2016 2206   KETONESUR NEGATIVE 07/06/2016 2206   PROTEINUR NEGATIVE 07/06/2016 2206   NITRITE POSITIVE (A) 07/06/2016 2206   LEUKOCYTESUR SMALL (A) 07/06/2016 2206      Imaging Results:    Dg Toe Great Right  Result Date: 07/06/2016 CLINICAL DATA:  Right great toe wound status post staple injury 2 weeks ago. EXAM: RIGHT GREAT TOE COMPARISON:  None. FINDINGS: There is no evidence of fracture or dislocation. There is no evidence of arthropathy or other focal bone abnormality. Soft tissue defect along the plantar aspect of the great  toe is noted consistent with superficial abrasion or ulceration. IMPRESSION: Soft tissue ulceration or abrasion along the plantar aspect of the right great toe. No underlying fracture or bone  destruction is seen. Electronically Signed   By: Tollie Eth M.D.   On: 07/06/2016 23:17       Assessment & Plan:    Active Problems:   Hypothyroid   Chronic airway obstruction (HCC)   Diabetes mellitus (HCC)   Cellulitis   1. Right great toe cellulitis- will start vancomycin per pharmacy consultation, cefepime to cover for Pseudomonas. Will obtain blood cultures 2. 2. UTI- patient has a normal UA, urine culture has been obtained. Started on cefepime as above. Follow urine culture results. 3. Diabetes mellitus- start sliding scale insulin with NovoLog. 4. COPD- stable, no exacerbation. Continue oxygen via nasal cannula. When necessary albuterol 5. Hypothyroidism-continue Synthroid.   DVT Prophylaxis-   Lovenox   AM Labs Ordered, also please review Full Orders  Family Communication: Admission, patients condition and plan of care including tests being ordered have been discussed with the patient  who indicate understanding and agree with the plan and Code Status.  Code Status: Full code  Admission status: Observation    Time spent in minutes : 60 minutes   Lanson Randle S M.D on 07/07/2016 at 2:14 AM  Between 7am to 7pm - Pager - [phone]. After 7pm go to [email or web address] - password [facility]  [facility] - Office  [phone]

## 2016-07-07 NOTE — Progress Notes (Signed)
This is an assumption of care note. She was in the hospital about 2 weeks ago with a bout of pneumonia. She has since had an injury to her right great toe and has developed a fever. She's been treated for cellulitis of the toe. She had mild acute on chronic hypercapnic respiratory failure and she's been placed on BiPAP but wants to come off now.  Physical examination shows that she is awake and alert and taken off BiPAP and she looks comfortable. Her toe shows some erythema but looks better than description on admission. Her chest is clear with decreased breath sounds  She's going to be treated for cellulitis of the toe. She can use BiPAP as needed. She will remain in stepdown today but probably could transfer tomorrow

## 2016-07-07 NOTE — ED Notes (Signed)
Blood cultures x 2 drawn by lab 

## 2016-07-07 NOTE — Progress Notes (Signed)
ANTIBIOTIC CONSULT NOTE- follow up  Pharmacy Consult for vancomycin, cefepime Indication: cellulitis, wound infection  Allergies  Allergen Reactions  . Duragesic Disc Transdermal System [Fentanyl] Nausea And Vomiting   Patient Measurements: Height: 5' (152.4 cm) Weight: 165 lb 5.5 oz (75 kg) IBW/kg (Calculated) : 45.5  Vital Signs: Temp: 98.2 F (36.8 C) (07/04 0800) Temp Source: Axillary (07/04 0800) BP: 129/63 (07/04 0744) Pulse Rate: 91 (07/04 0744)  Labs:  Recent Labs  07/06/16 2217 07/07/16 0247  WBC 16.9* 11.9*  HGB 10.2* 9.2*  PLT 284 248  CREATININE 0.66 0.55   Estimated Creatinine Clearance: 70.2 mL/min (by C-G formula based on SCr of 0.55 mg/dL).  No results for input(s): VANCOTROUGH, VANCOPEAK, VANCORANDOM, GENTTROUGH, GENTPEAK, GENTRANDOM, TOBRATROUGH, TOBRAPEAK, TOBRARND, AMIKACINPEAK, AMIKACINTROU, AMIKACIN in the last 72 hours.   Microbiology: Recent Results (from the past 720 hour(s))  Culture, blood (routine x 2) Call MD if unable to obtain prior to antibiotics being given     Status: None   Collection Time: 06/21/16  4:57 AM  Result Value Ref Range Status   Specimen Description BLOOD LEFT HAND  Final   Special Requests   Final    BOTTLES DRAWN AEROBIC AND ANAEROBIC Blood Culture adequate volume   Culture NO GROWTH 5 DAYS  Final   Report Status 06/26/2016 FINAL  Final  Culture, blood (routine x 2) Call MD if unable to obtain prior to antibiotics being given     Status: None   Collection Time: 06/21/16  5:58 AM  Result Value Ref Range Status   Specimen Description BLOOD LEFT ARM  Final   Special Requests   Final    BOTTLES DRAWN AEROBIC AND ANAEROBIC Blood Culture adequate volume   Culture NO GROWTH 5 DAYS  Final   Report Status 06/26/2016 FINAL  Final  Culture, blood (Routine X 2) w Reflex to ID Panel     Status: None (Preliminary result)   Collection Time: 07/06/16 10:17 PM  Result Value Ref Range Status   Specimen Description BLOOD LEFT HAND   Final   Special Requests   Final    BOTTLES DRAWN AEROBIC AND ANAEROBIC Blood Culture adequate volume   Culture PENDING  Incomplete   Report Status PENDING  Incomplete  Culture, blood (Routine X 2) w Reflex to ID Panel     Status: None (Preliminary result)   Collection Time: 07/07/16  2:47 AM  Result Value Ref Range Status   Specimen Description BLOOD LEFT HAND  Final   Special Requests   Final    BOTTLES DRAWN AEROBIC AND ANAEROBIC Blood Culture results may not be optimal due to an inadequate volume of blood received in culture bottles   Culture PENDING  Incomplete   Report Status PENDING  Incomplete   Medical History: Past Medical History:  Diagnosis Date  . Anxiety   . Bulging disc   . Carpal tunnel syndrome   . Cervicalgia   . Chronic airway obstruction (HCC)   . COPD (chronic obstructive pulmonary disease) (HCC)   . Coronary artery disease   . Diabetes mellitus (HCC) 01/11/2012  . GERD (gastroesophageal reflux disease)   . Headache(784.0)   . Hypothyroid   . Lumbago   . Migraine   . Myalgia and myositis, unspecified   . Neuropathy   . Pneumonia   . Restless leg syndrome   . Shortness of breath   . Sleep apnea    Medications:  Scheduled:  . atorvastatin  40 mg Oral QHS  .  doxepin  100 mg Oral QHS  . enoxaparin (LOVENOX) injection  40 mg Subcutaneous Q24H  . ferrous sulfate  325 mg Oral Daily  . guaiFENesin  1,200 mg Oral BID  . insulin aspart  0-9 Units Subcutaneous TID WC  . levothyroxine  75 mcg Oral QAC breakfast  . loratadine  10 mg Oral Daily  . pantoprazole  40 mg Oral Daily  . pregabalin  300 mg Oral BID  . rOPINIRole  0.5 mg Oral TID  . valACYclovir  500 mg Oral BID   Infusions:  . sodium chloride 125 mL/hr at 07/07/16 0929  . ceFEPime (MAXIPIME) IV    . ceFEPime (MAXIPIME) IV    . vancomycin     PRN: acetaminophen **OR** acetaminophen, albuterol, clonazePAM, HYDROcodone-acetaminophen, naphazoline-glycerin, ondansetron **OR** ondansetron (ZOFRAN)  IV, polyethylene glycol Anti-infectives    Start     Dose/Rate Route Frequency Ordered Stop   07/07/16 2200  ceFEPIme (MAXIPIME) 2 g in dextrose 5 % 50 mL IVPB     2 g 100 mL/hr over 30 Minutes Intravenous Every 12 hours 07/07/16 0740     07/07/16 1800  vancomycin (VANCOCIN) IVPB 1000 mg/200 mL premix     1,000 mg 200 mL/hr over 60 Minutes Intravenous Every 12 hours 07/07/16 0739     07/07/16 0800  ceFEPIme (MAXIPIME) 2 g in dextrose 5 % 50 mL IVPB     2 g 100 mL/hr over 30 Minutes Intravenous  Once 07/07/16 0527     07/07/16 0330  vancomycin (VANCOCIN) IVPB 1000 mg/200 mL premix     1,000 mg 200 mL/hr over 60 Minutes Intravenous  Once 07/07/16 0318 07/07/16 0523   07/07/16 0315  valACYclovir (VALTREX) tablet 500 mg     500 mg Oral 2 times daily 07/07/16 0310     07/06/16 2345  ceFEPIme (MAXIPIME) 2 g in dextrose 5 % 50 mL IVPB     2 g 100 mL/hr over 30 Minutes Intravenous  Once 07/06/16 2330 07/07/16 0300     Assessment: Pt discharged on 6/20 after treatment for PNA. Admitted today for swelling and pain of right great toe for past 2 weeks. Vancomycin started for cellulitis, cefepime to cover pseudomonas. WBC 16.9, lactic acid 2.1 on admission.   Goal of Therapy:  Vancomycin trough level 10-15 mcg/ml  Plan:  Vancomycin 1000mg  IV q12h Check trough at steady state Cefepime 2gm IV q12hrs Monitor labs, renal fxn, progress and c/s Deescalate ABX when improved / appropriate.     Valrie HartHall, Zenora Karpel A, RPH 07/07/2016,9:59 AM

## 2016-07-07 NOTE — Progress Notes (Signed)
ANTIBIOTIC CONSULT NOTE-Preliminary  Pharmacy Consult for vancomycin, cefepime Indication: cellulitis, wound infection  Allergies  Allergen Reactions  . Duragesic Disc Transdermal System [Fentanyl] Nausea And Vomiting    Patient Measurements: Height: 5' (152.4 cm) Weight: 165 lb 5.5 oz (75 kg) IBW/kg (Calculated) : 45.5 Adjusted Body Weight:   Vital Signs: Temp: 98.2 F (36.8 C) (07/04 0510) Temp Source: Oral (07/04 0510) BP: 92/66 (07/04 0438) Pulse Rate: 87 (07/04 0403)  Labs:  Recent Labs  07/06/16 2217 07/07/16 0247  WBC 16.9* 11.9*  HGB 10.2* 9.2*  PLT 284 248  CREATININE 0.66 0.55    Estimated Creatinine Clearance: 70.2 mL/min (by C-G formula based on SCr of 0.55 mg/dL).  No results for input(s): VANCOTROUGH, VANCOPEAK, VANCORANDOM, GENTTROUGH, GENTPEAK, GENTRANDOM, TOBRATROUGH, TOBRAPEAK, TOBRARND, AMIKACINPEAK, AMIKACINTROU, AMIKACIN in the last 72 hours.   Microbiology: Recent Results (from the past 720 hour(s))  Culture, blood (routine x 2) Call MD if unable to obtain prior to antibiotics being given     Status: None   Collection Time: 06/21/16  4:57 AM  Result Value Ref Range Status   Specimen Description BLOOD LEFT HAND  Final   Special Requests   Final    BOTTLES DRAWN AEROBIC AND ANAEROBIC Blood Culture adequate volume   Culture NO GROWTH 5 DAYS  Final   Report Status 06/26/2016 FINAL  Final  Culture, blood (routine x 2) Call MD if unable to obtain prior to antibiotics being given     Status: None   Collection Time: 06/21/16  5:58 AM  Result Value Ref Range Status   Specimen Description BLOOD LEFT ARM  Final   Special Requests   Final    BOTTLES DRAWN AEROBIC AND ANAEROBIC Blood Culture adequate volume   Culture NO GROWTH 5 DAYS  Final   Report Status 06/26/2016 FINAL  Final  Culture, blood (Routine X 2) w Reflex to ID Panel     Status: None (Preliminary result)   Collection Time: 07/06/16 10:17 PM  Result Value Ref Range Status   Specimen  Description BLOOD LEFT HAND  Final   Special Requests   Final    BOTTLES DRAWN AEROBIC AND ANAEROBIC Blood Culture adequate volume   Culture PENDING  Incomplete   Report Status PENDING  Incomplete  Culture, blood (Routine X 2) w Reflex to ID Panel     Status: None (Preliminary result)   Collection Time: 07/07/16  2:47 AM  Result Value Ref Range Status   Specimen Description BLOOD LEFT HAND  Final   Special Requests   Final    BOTTLES DRAWN AEROBIC AND ANAEROBIC Blood Culture results may not be optimal due to an inadequate volume of blood received in culture bottles   Culture PENDING  Incomplete   Report Status PENDING  Incomplete    Medical History: Past Medical History:  Diagnosis Date  . Anxiety   . Bulging disc   . Carpal tunnel syndrome   . Cervicalgia   . Chronic airway obstruction (HCC)   . COPD (chronic obstructive pulmonary disease) (HCC)   . Coronary artery disease   . Diabetes mellitus (HCC) 01/11/2012  . GERD (gastroesophageal reflux disease)   . Headache(784.0)   . Hypothyroid   . Lumbago   . Migraine   . Myalgia and myositis, unspecified   . Neuropathy   . Pneumonia   . Restless leg syndrome   . Shortness of breath   . Sleep apnea     Medications:  Scheduled:  . atorvastatin  40 mg Oral QHS  . doxepin  100 mg Oral QHS  . enoxaparin (LOVENOX) injection  40 mg Subcutaneous Q24H  . ferrous sulfate  325 mg Oral Daily  . guaiFENesin  1,200 mg Oral BID  . insulin aspart  0-9 Units Subcutaneous TID WC  . levothyroxine  75 mcg Oral QAC breakfast  . loratadine  10 mg Oral Daily  . pantoprazole  40 mg Oral Daily  . pregabalin  300 mg Oral BID  . rOPINIRole  0.5 mg Oral TID  . valACYclovir  500 mg Oral BID   Infusions:  . sodium chloride 125 mL/hr at 07/07/16 0348  . ceFEPime (MAXIPIME) IV     PRN: acetaminophen **OR** acetaminophen, albuterol, clonazePAM, naphazoline-glycerin, ondansetron **OR** ondansetron (ZOFRAN) IV, polyethylene glycol Anti-infectives     Start     Dose/Rate Route Frequency Ordered Stop   07/07/16 0800  ceFEPIme (MAXIPIME) 2 g in dextrose 5 % 50 mL IVPB     2 g 100 mL/hr over 30 Minutes Intravenous  Once 07/07/16 0527     07/07/16 0330  vancomycin (VANCOCIN) IVPB 1000 mg/200 mL premix     1,000 mg 200 mL/hr over 60 Minutes Intravenous  Once 07/07/16 0318 07/07/16 0523   07/07/16 0315  valACYclovir (VALTREX) tablet 500 mg     500 mg Oral 2 times daily 07/07/16 0310     07/06/16 2345  ceFEPIme (MAXIPIME) 2 g in dextrose 5 % 50 mL IVPB     2 g 100 mL/hr over 30 Minutes Intravenous  Once 07/06/16 2330 07/07/16 0300      Assessment: Pt discharged on 6/20 after treatment for  PNA. Admitted today for swelling and pain of right great toe for past 2 weeks. Vancomycin started for cellulitis, cefepime to cover pseudomonas. WBC 16.9, lactic acid 2.1 on admission.   Goal of Therapy:  Vancomycin trough level 10-15 mcg/ml  Plan:  Preliminary review of pertinent patient information completed.  Protocol will be initiated with dose(s) of vancomycin 1000mg  and cefepime 2 grams x1 8 hours after ED dose. Pattricia Boss [facility] clinical pharmacist will complete review during morning rounds to assess patient and finalize treatment regimen if needed.  Kiyanna Biegler Scarlett, RPH 07/07/2016,5:39 AM

## 2016-07-08 DIAGNOSIS — J9621 Acute and chronic respiratory failure with hypoxia: Secondary | ICD-10-CM | POA: Diagnosis not present

## 2016-07-08 DIAGNOSIS — J449 Chronic obstructive pulmonary disease, unspecified: Secondary | ICD-10-CM | POA: Diagnosis not present

## 2016-07-08 DIAGNOSIS — J9601 Acute respiratory failure with hypoxia: Secondary | ICD-10-CM | POA: Diagnosis present

## 2016-07-08 DIAGNOSIS — L03039 Cellulitis of unspecified toe: Secondary | ICD-10-CM | POA: Diagnosis not present

## 2016-07-08 DIAGNOSIS — J9602 Acute respiratory failure with hypercapnia: Secondary | ICD-10-CM | POA: Diagnosis present

## 2016-07-08 DIAGNOSIS — J9622 Acute and chronic respiratory failure with hypercapnia: Secondary | ICD-10-CM | POA: Diagnosis not present

## 2016-07-08 LAB — GLUCOSE, CAPILLARY: GLUCOSE-CAPILLARY: 128 mg/dL — AB (ref 65–99)

## 2016-07-08 LAB — HEMOGLOBIN A1C
Hgb A1c MFr Bld: 5.6 % (ref 4.8–5.6)
Mean Plasma Glucose: 114 mg/dL

## 2016-07-08 MED ORDER — DOXYCYCLINE HYCLATE 100 MG PO CAPS: 100.0000 mg | ORAL_CAPSULE | Freq: Two times a day (BID) | ORAL | 1 refills | Status: DC

## 2016-07-08 NOTE — Discharge Summary (Signed)
Physician Discharge Summary  Patient ID: Patricia Bass MRN: [id number] DOB/AGE: 06/20/1960 56 y.o. Primary Care Physician:Patricia Bass Admit date: 07/06/2016 Discharge date: 07/08/2016    Discharge Diagnoses:   Active Problems:   Hypothyroid   Lumbago   Chronic airway obstruction (HCC)   Anxiety   Diabetes mellitus (HCC)   Respiratory failure, acute and chronic (HCC)   Cellulitis   Cellulitis of great toe   Acute respiratory failure with hypoxia (HCC)   Acute respiratory failure with hypercapnia (HCC)   Allergies as of 07/08/2016      Reactions   Duragesic Disc Transdermal System [fentanyl] Nausea And Vomiting      Medication List    TAKE these medications   albuterol 108 (90 Base) MCG/ACT inhaler Commonly known as:  PROVENTIL HFA;VENTOLIN HFA Inhale 2 puffs into the lungs every 6 (six) hours as needed for wheezing or shortness of breath.   atorvastatin 40 MG tablet Commonly known as:  LIPITOR Take 40 mg by mouth at bedtime.   cetirizine 10 MG tablet Commonly known as:  ZYRTEC Take 10 mg by mouth daily as needed for allergies.   clonazePAM 1 MG tablet Commonly known as:  KLONOPIN Take 1-2 mg by mouth 2 (two) times daily. Take 1 tablet in the morning and 2 tablets at bedtime.   doxepin 100 MG capsule Commonly known as:  SINEQUAN Take 100 mg by mouth at bedtime.   doxycycline 100 MG capsule Commonly known as:  VIBRAMYCIN Take 1 capsule (100 mg total) by mouth 2 (two) times daily.   ferrous sulfate 325 (65 FE) MG tablet Take 1 tablet (325 mg total) by mouth daily.   guaiFENesin 600 MG 12 hr tablet Commonly known as:  MUCINEX Take 2 tablets (1,200 mg total) by mouth 2 (two) times daily.   HYDROcodone-acetaminophen 10-325 MG tablet Commonly known as:  NORCO Take 1 tablet by mouth every 6 (six) hours as needed for moderate pain.   hydrocortisone valerate cream 0.2 % Commonly known as:  WESTCORT Apply 1 application topically daily as needed for rash.    ibuprofen 800 MG tablet Commonly known as:  ADVIL,MOTRIN Take 800 mg by mouth every 8 (eight) hours as needed for mild pain or moderate pain.   levothyroxine 75 MCG tablet Commonly known as:  SYNTHROID, LEVOTHROID Take 1 tablet (75 mcg total) by mouth daily before breakfast.   lidocaine 5 % ointment Commonly known as:  XYLOCAINE Apply 1 application topically 4 (four) times daily as needed for mild pain.   lisinopril 2.5 MG tablet Commonly known as:  PRINIVIL,ZESTRIL Take 2.5 mg by mouth daily.   metFORMIN 500 MG tablet Commonly known as:  GLUCOPHAGE Take 1 tablet (500 mg total) by mouth 2 (two) times daily with a meal.   mometasone 50 MCG/ACT nasal spray Commonly known as:  NASONEX Place 2 sprays into the nose daily as needed (for rhinitis).   naphazoline-glycerin 0.012-0.2 % Soln Commonly known as:  CLEAR EYES Place 1-2 drops into both eyes every 4 (four) hours as needed for irritation.   omega-3 acid ethyl esters 1 g capsule Commonly known as:  LOVAZA Take 1 g by mouth daily.   omeprazole 40 MG capsule Commonly known as:  PRILOSEC Take 1 capsule (40 mg total) by mouth daily. What changed:  when to take this  reasons to take this   polyethylene glycol packet Commonly known as:  MIRALAX / GLYCOLAX Take 17 g by mouth daily as needed for mild constipation.  pregabalin 300 MG capsule Commonly known as:  LYRICA Take 1 capsule (300 mg total) by mouth 2 (two) times daily.   rOPINIRole 0.5 MG tablet Commonly known as:  REQUIP Take 1 tablet (0.5 mg total) by mouth 3 (three) times daily.   valACYclovir 500 MG tablet Commonly known as:  VALTREX Take 500 mg by mouth 2 (two) times daily.       Discharged Condition:Improved    Consults: None  Significant Diagnostic Studies: Dg Chest 2 View  Result Date: 06/20/2016 CLINICAL DATA:  56 year old female with fever and cough. EXAM: CHEST  2 VIEW COMPARISON:  Radiograph dated 01/24/2012 FINDINGS: Confluent area of  airspace density in the right mid to lower lung field noted concerning for developing pneumonia, likely in the right middle lobe. There is no pleural effusion or pneumothorax. Stable top-normal cardiac size. No acute osseous pathology. IMPRESSION: Findings concerning for developing right middle lobe pneumonia. Clinical correlation and follow-up recommended. Electronically Signed   By: Elgie Collard M.D.   On: 06/20/2016 23:23   Dg Toe Great Right  Result Date: 07/06/2016 CLINICAL DATA:  Right great toe wound status post staple injury 2 weeks ago. EXAM: RIGHT GREAT TOE COMPARISON:  None. FINDINGS: There is no evidence of fracture or dislocation. There is no evidence of arthropathy or other focal bone abnormality. Soft tissue defect along the plantar aspect of the great toe is noted consistent with superficial abrasion or ulceration. IMPRESSION: Soft tissue ulceration or abrasion along the plantar aspect of the right great toe. No underlying fracture or bone destruction is seen. Electronically Signed   By: Tollie Eth M.D.   On: 07/06/2016 23:17    Lab Results: Basic Metabolic Panel:  Recent Labs  16/10/96 2217 07/07/16 0247  NA 132* 136  K 4.0 3.6  CL 97* 101  CO2 26 27  GLUCOSE 125* 101*  BUN 8 8  CREATININE 0.66 0.55  CALCIUM 8.7* 8.1*   Liver Function Tests:  Recent Labs  07/07/16 0247  AST 39  ALT 32  ALKPHOS 94  BILITOT 0.4  PROT 6.7  ALBUMIN 3.1*     CBC:  Recent Labs  07/06/16 2217 07/07/16 0247  WBC 16.9* 11.9*  NEUTROABS 12.9*  --   HGB 10.2* 9.2*  HCT 35.0* 30.8*  MCV 85.6 85.8  PLT 284 248    Recent Results (from the past 240 hour(s))  Culture, blood (Routine X 2) w Reflex to ID Panel     Status: None (Preliminary result)   Collection Time: 07/06/16 10:17 PM  Result Value Ref Range Status   Specimen Description BLOOD LEFT HAND  Final   Special Requests   Final    BOTTLES DRAWN AEROBIC AND ANAEROBIC Blood Culture adequate volume   Culture NO GROWTH  < 12 HOURS  Final   Report Status PENDING  Incomplete  Culture, blood (Routine X 2) w Reflex to ID Panel     Status: None (Preliminary result)   Collection Time: 07/07/16  2:47 AM  Result Value Ref Range Status   Specimen Description BLOOD LEFT HAND  Final   Special Requests   Final    BOTTLES DRAWN AEROBIC AND ANAEROBIC Blood Culture results may not be optimal due to an inadequate volume of blood received in culture bottles   Culture NO GROWTH < 12 HOURS  Final   Report Status PENDING  Incomplete  MRSA PCR Screening     Status: None   Collection Time: 07/07/16  5:26 AM  Result  Value Ref Range Status   MRSA by PCR NEGATIVE NEGATIVE Final    Comment:        The GeneXpert MRSA Assay (FDA approved for NASAL specimens only), is one component of a comprehensive MRSA colonization surveillance program. It is not intended to diagnose MRSA infection nor to guide or monitor treatment for MRSA infections.      Hospital Course: This is a 56 year old who had been in the hospital with pneumonia. She was doing well as far as that was concerning came to the emergency department because of redness and swelling of her right great toe. She had stumbled on a staple by removing carpeting in her bedroom. She's been using peroxide on the toe but she's had weightbearing difficulty. She had a white blood count of 16,900 in the emergency department and her chest x-ray was read as showing possibly developing pneumonia but I think this is an area that has not cleared from her previous bout of pneumonia based on my personal review of film. She was started on cefepime and vancomycin and showed marked improvement very rapidly. She required BiPAP briefly but this was discontinued and she did not require BiPAP during the rest of her hospital stay. She required more than her home level of oxygen up to about 4 L but that was able to be weaned. By the time of discharge her toe looked much better she didn't need BiPAP or  oxygen was back at 2 L and she was ready to go home. Her diabetes was treated while she was in the hospital with sliding scale insulin  Discharge Exam: Blood pressure 113/70, pulse 78, temperature 97.8 F (36.6 C), temperature source Oral, resp. rate 12, height 5' (1.524 m), weight 75 kg (165 lb 5.5 oz), SpO2 94 %. She is awake and alert. Her toe looks much better. No erythema now.  Disposition: Home. She has an aid at home and doesn't feel like she needs any other services now  Discharge Instructions    Diet - low sodium heart healthy    Complete by:  As directed    Increase activity slowly    Complete by:  As directed         Signed: Kristian Hazzard L   07/08/2016, 7:35 AM

## 2016-07-08 NOTE — Plan of Care (Signed)
Problem: Skin Integrity: Goal: Risk for impaired skin integrity will decrease Outcome: Progressing Discussed diabetic foot care. Encouraged to wear shoes and not to use abrasive pedicure tools to remove callous.

## 2016-07-08 NOTE — Progress Notes (Signed)
Subjective: She says she feels much better. No complaints of pain in the toe. She's not had any fever. She has not required BiPAP since yesterday morning. We have been able to wean her oxygen down to about 2-3 L and her oxygenation is okay. She's been moving around. She says she wants to go home. Blood cultures are negative so far. She denies shortness of breath. No cough or congestion.  Objective: Vital signs in last 24 hours: Temp:  [97.8 F (36.6 C)-98.3 F (36.8 C)] 97.8 F (36.6 C) (07/05 0400) Pulse Rate:  [77-96] 78 (07/05 0600) Resp:  [11-23] 12 (07/05 0600) BP: (101-136)/(62-82) 113/70 (07/05 0600) SpO2:  [91 %-99 %] 94 % (07/05 0600) Weight:  [75 kg (165 lb 5.5 oz)] 75 kg (165 lb 5.5 oz) (07/05 0500) Weight change: -6.647 kg (-14 lb 10.5 oz) Last BM Date: 07/07/16  Intake/Output from previous day: 07/04 0701 - 07/05 0700 In: 3818.3 [P.O.:360; I.V.:3408.3; IV Piggyback:50] Out: 4225 [Urine:4225]  PHYSICAL EXAM General appearance: alert, cooperative and no distress Resp: clear to auscultation bilaterally Cardio: regular rate and rhythm, S1, S2 normal, no murmur, click, rub or gallop GI: soft, non-tender; bowel sounds normal; no masses,  no organomegaly Extremities: Her toe looks much better. She still has an open area at the tip of her toe but her toe does not show any erythema and is no longer warm Mucous membranes are moist  Lab Results:  Results for orders placed or performed during the hospital encounter of 07/06/16 (from the past 48 hour(s))  Urinalysis, Routine w reflex microscopic     Status: Abnormal   Collection Time: 07/06/16 10:06 PM  Result Value Ref Range   Color, Urine YELLOW YELLOW   APPearance HAZY (A) CLEAR   Specific Gravity, Urine 1.009 1.005 - 1.030   pH 5.0 5.0 - 8.0   Glucose, UA NEGATIVE NEGATIVE mg/dL   Hgb urine dipstick NEGATIVE NEGATIVE   Bilirubin Urine NEGATIVE NEGATIVE   Ketones, ur NEGATIVE NEGATIVE mg/dL   Protein, ur NEGATIVE  NEGATIVE mg/dL   Nitrite POSITIVE (A) NEGATIVE   Leukocytes, UA SMALL (A) NEGATIVE   RBC / HPF 0-5 0 - 5 RBC/hpf   WBC, UA 6-30 0 - 5 WBC/hpf   Bacteria, UA RARE (A) NONE SEEN   Squamous Epithelial / LPF 0-5 (A) NONE SEEN  Basic metabolic panel     Status: Abnormal   Collection Time: 07/06/16 10:17 PM  Result Value Ref Range   Sodium 132 (L) 135 - 145 mmol/L   Potassium 4.0 3.5 - 5.1 mmol/L   Chloride 97 (L) 101 - 111 mmol/L   CO2 26 22 - 32 mmol/L   Glucose, Bld 125 (H) 65 - 99 mg/dL   BUN 8 6 - 20 mg/dL   Creatinine, Ser 0.66 0.44 - 1.00 mg/dL   Calcium 8.7 (L) 8.9 - 10.3 mg/dL   GFR calc non Af Amer >60 >60 mL/min   GFR calc Af Amer >60 >60 mL/min    Comment: (NOTE) The eGFR has been calculated using the CKD EPI equation. This calculation has not been validated in all clinical situations. eGFR's persistently <60 mL/min signify possible Chronic Kidney Disease.    Anion gap 9 5 - 15  CBC with Differential     Status: Abnormal   Collection Time: 07/06/16 10:17 PM  Result Value Ref Range   WBC 16.9 (H) 4.0 - 10.5 K/uL   RBC 4.09 3.87 - 5.11 MIL/uL   Hemoglobin 10.2 (L) 12.0 -  15.0 g/dL   HCT 35.0 (L) 36.0 - 46.0 %   MCV 85.6 78.0 - 100.0 fL   MCH 24.9 (L) 26.0 - 34.0 pg   MCHC 29.1 (L) 30.0 - 36.0 g/dL   RDW 21.8 (H) 11.5 - 15.5 %   Platelets 284 150 - 400 K/uL   Neutrophils Relative % 77 %   Neutro Abs 12.9 (H) 1.7 - 7.7 K/uL   Lymphocytes Relative 18 %   Lymphs Abs 3.0 0.7 - 4.0 K/uL   Monocytes Relative 5 %   Monocytes Absolute 0.9 0.1 - 1.0 K/uL   Eosinophils Relative 0 %   Eosinophils Absolute 0.1 0.0 - 0.7 K/uL   Basophils Relative 0 %   Basophils Absolute 0.0 0.0 - 0.1 K/uL  Lactic acid, plasma     Status: Abnormal   Collection Time: 07/06/16 10:17 PM  Result Value Ref Range   Lactic Acid, Venous 2.1 (HH) 0.5 - 1.9 mmol/L    Comment: CRITICAL RESULT CALLED TO, READ BACK BY AND VERIFIED WITH:  TALBOTT,T @ 2312 ON 07/06/16 BY JUW   Culture, blood (Routine X  2) w Reflex to ID Panel     Status: None (Preliminary result)   Collection Time: 07/06/16 10:17 PM  Result Value Ref Range   Specimen Description BLOOD LEFT HAND    Special Requests      BOTTLES DRAWN AEROBIC AND ANAEROBIC Blood Culture adequate volume   Culture NO GROWTH < 12 HOURS    Report Status PENDING   Hemoglobin A1c     Status: None   Collection Time: 07/06/16 10:17 PM  Result Value Ref Range   Hgb A1c MFr Bld 5.6 4.8 - 5.6 %    Comment: (NOTE)         Pre-diabetes: 5.7 - 6.4         Diabetes: >6.4         Glycemic control for adults with diabetes: <7.0    Mean Plasma Glucose 114 mg/dL    Comment: (NOTE) Performed At: [facility] [place], [place] [id number] Lindon Romp MD PP:[id number]   HIV antibody (Routine Testing)     Status: None   Collection Time: 07/06/16 10:17 PM  Result Value Ref Range   HIV Screen 4th Generation wRfx Non Reactive Non Reactive    Comment: (NOTE) Performed At: [facility] [address] [place], [place] [id number] Lindon Romp MD SN:[id number]   Lactic acid, plasma     Status: None   Collection Time: 07/07/16  1:17 AM  Result Value Ref Range   Lactic Acid, Venous 1.1 0.5 - 1.9 mmol/L  Culture, blood (Routine X 2) w Reflex to ID Panel     Status: None (Preliminary result)   Collection Time: 07/07/16  2:47 AM  Result Value Ref Range   Specimen Description BLOOD LEFT HAND    Special Requests      BOTTLES DRAWN AEROBIC AND ANAEROBIC Blood Culture results may not be optimal due to an inadequate volume of blood received in culture bottles   Culture NO GROWTH < 12 HOURS    Report Status PENDING   CBC     Status: Abnormal   Collection Time: 07/07/16  2:47 AM  Result Value Ref Range   WBC 11.9 (H) 4.0 - 10.5 K/uL   RBC 3.59 (L) 3.87 - 5.11 MIL/uL   Hemoglobin 9.2 (L) 12.0 - 15.0 g/dL   HCT 30.8 (L) 36.0 - 46.0 %   MCV  85.8 78.0 - 100.0 fL   MCH 25.6 (L) 26.0 - 34.0 pg   MCHC 29.9 (L) 30.0 - 36.0  g/dL   RDW 21.7 (H) 11.5 - 15.5 %   Platelets 248 150 - 400 K/uL  Comprehensive metabolic panel     Status: Abnormal   Collection Time: 07/07/16  2:47 AM  Result Value Ref Range   Sodium 136 135 - 145 mmol/L   Potassium 3.6 3.5 - 5.1 mmol/L   Chloride 101 101 - 111 mmol/L   CO2 27 22 - 32 mmol/L   Glucose, Bld 101 (H) 65 - 99 mg/dL   BUN 8 6 - 20 mg/dL   Creatinine, Ser 0.55 0.44 - 1.00 mg/dL   Calcium 8.1 (L) 8.9 - 10.3 mg/dL   Total Protein 6.7 6.5 - 8.1 g/dL   Albumin 3.1 (L) 3.5 - 5.0 g/dL   AST 39 15 - 41 U/L   ALT 32 14 - 54 U/L   Alkaline Phosphatase 94 38 - 126 U/L   Total Bilirubin 0.4 0.3 - 1.2 mg/dL   GFR calc non Af Amer >60 >60 mL/min   GFR calc Af Amer >60 >60 mL/min    Comment: (NOTE) The eGFR has been calculated using the CKD EPI equation. This calculation has not been validated in all clinical situations. eGFR's persistently <60 mL/min signify possible Chronic Kidney Disease.    Anion gap 8 5 - 15  Glucose, capillary     Status: None   Collection Time: 07/07/16  3:46 AM  Result Value Ref Range   Glucose-Capillary 88 65 - 99 mg/dL  Blood gas, arterial     Status: Abnormal   Collection Time: 07/07/16  3:50 AM  Result Value Ref Range   FIO2 28.00    Delivery systems NASAL CANNULA     Comment: 2LPM   pH, Arterial 7.312 (L) 7.350 - 7.450   pCO2 arterial 57.0 (H) 32.0 - 48.0 mmHg   pO2, Arterial 66.0 (L) 83.0 - 108.0 mmHg   Bicarbonate 25.8 20.0 - 28.0 mmol/L   Acid-Base Excess 2.3 (H) 0.0 - 2.0 mmol/L   O2 Saturation 89.6 %   Patient temperature 37.0    Collection site RIGHT RADIAL    Drawn by [id number]    Sample type ARTERIAL DRAW    Allens test (pass/fail) POSITIVE (A) PASS  MRSA PCR Screening     Status: None   Collection Time: 07/07/16  5:26 AM  Result Value Ref Range   MRSA by PCR NEGATIVE NEGATIVE    Comment:        The GeneXpert MRSA Assay (FDA approved for NASAL specimens only), is one component of a comprehensive MRSA  colonization surveillance program. It is not intended to diagnose MRSA infection nor to guide or monitor treatment for MRSA infections.   Glucose, capillary     Status: None   Collection Time: 07/07/16  7:53 AM  Result Value Ref Range   Glucose-Capillary 99 65 - 99 mg/dL  Glucose, capillary     Status: Abnormal   Collection Time: 07/07/16 11:31 AM  Result Value Ref Range   Glucose-Capillary 117 (H) 65 - 99 mg/dL  Glucose, capillary     Status: Abnormal   Collection Time: 07/07/16  4:17 PM  Result Value Ref Range   Glucose-Capillary 118 (H) 65 - 99 mg/dL  Glucose, capillary     Status: Abnormal   Collection Time: 07/07/16  9:11 PM  Result Value Ref Range   Glucose-Capillary 104 (  H) 65 - 99 mg/dL    ABGS  Recent Labs  07/07/16 0350  PHART 7.312*  PO2ART 66.0*  HCO3 25.8   CULTURES Recent Results (from the past 240 hour(s))  Culture, blood (Routine X 2) w Reflex to ID Panel     Status: None (Preliminary result)   Collection Time: 07/06/16 10:17 PM  Result Value Ref Range Status   Specimen Description BLOOD LEFT HAND  Final   Special Requests   Final    BOTTLES DRAWN AEROBIC AND ANAEROBIC Blood Culture adequate volume   Culture NO GROWTH < 12 HOURS  Final   Report Status PENDING  Incomplete  Culture, blood (Routine X 2) w Reflex to ID Panel     Status: None (Preliminary result)   Collection Time: 07/07/16  2:47 AM  Result Value Ref Range Status   Specimen Description BLOOD LEFT HAND  Final   Special Requests   Final    BOTTLES DRAWN AEROBIC AND ANAEROBIC Blood Culture results may not be optimal due to an inadequate volume of blood received in culture bottles   Culture NO GROWTH < 12 HOURS  Final   Report Status PENDING  Incomplete  MRSA PCR Screening     Status: None   Collection Time: 07/07/16  5:26 AM  Result Value Ref Range Status   MRSA by PCR NEGATIVE NEGATIVE Final    Comment:        The GeneXpert MRSA Assay (FDA approved for NASAL specimens only), is one  component of a comprehensive MRSA colonization surveillance program. It is not intended to diagnose MRSA infection nor to guide or monitor treatment for MRSA infections.    Studies/Results: Dg Toe Great Right  Result Date: 07/06/2016 CLINICAL DATA:  Right great toe wound status post staple injury 2 weeks ago. EXAM: RIGHT GREAT TOE COMPARISON:  None. FINDINGS: There is no evidence of fracture or dislocation. There is no evidence of arthropathy or other focal bone abnormality. Soft tissue defect along the plantar aspect of the great toe is noted consistent with superficial abrasion or ulceration. IMPRESSION: Soft tissue ulceration or abrasion along the plantar aspect of the right great toe. No underlying fracture or bone destruction is seen. Electronically Signed   By: Ashley Royalty M.D.   On: 07/06/2016 23:17    Medications:  Prior to Admission:  Prescriptions Prior to Admission  Medication Sig Dispense Refill Last Dose  . albuterol (PROVENTIL HFA;VENTOLIN HFA) 108 (90 BASE) MCG/ACT inhaler Inhale 2 puffs into the lungs every 6 (six) hours as needed for wheezing or shortness of breath.    unknown  . atorvastatin (LIPITOR) 40 MG tablet Take 40 mg by mouth at bedtime.   07/05/2016 at Unknown time  . cetirizine (ZYRTEC) 10 MG tablet Take 10 mg by mouth daily as needed for allergies.    Past Week at Unknown time  . clonazePAM (KLONOPIN) 1 MG tablet Take 1-2 mg by mouth 2 (two) times daily. Take 1 tablet in the morning and 2 tablets at bedtime.   07/06/2016 at Unknown time  . doxepin (SINEQUAN) 100 MG capsule Take 100 mg by mouth at bedtime.   07/05/2016  . ferrous sulfate 325 (65 FE) MG tablet Take 1 tablet (325 mg total) by mouth daily. 30 tablet 3 07/06/2016 at Unknown time  . guaiFENesin (MUCINEX) 600 MG 12 hr tablet Take 2 tablets (1,200 mg total) by mouth 2 (two) times daily. 30 tablet 1 07/06/2016 at Unknown time  . HYDROcodone-acetaminophen (NORCO) 10-325 MG  per tablet Take 1 tablet by mouth every 6  (six) hours as needed for moderate pain.    07/06/2016 at Unknown time  . hydrocortisone valerate cream (WESTCORT) 0.2 % Apply 1 application topically daily as needed for rash.   unknown  . ibuprofen (ADVIL,MOTRIN) 800 MG tablet Take 800 mg by mouth every 8 (eight) hours as needed for mild pain or moderate pain.    07/06/2016 at Unknown time  . levothyroxine (SYNTHROID, LEVOTHROID) 75 MCG tablet Take 1 tablet (75 mcg total) by mouth daily before breakfast. 30 tablet 0 07/06/2016 at Unknown time  . lidocaine (XYLOCAINE) 5 % ointment Apply 1 application topically 4 (four) times daily as needed for mild pain.   unknown  . lisinopril (PRINIVIL,ZESTRIL) 2.5 MG tablet Take 2.5 mg by mouth daily.   07/06/2016 at Unknown time  . metFORMIN (GLUCOPHAGE) 500 MG tablet Take 1 tablet (500 mg total) by mouth 2 (two) times daily with a meal. 60 tablet 0 07/06/2016 at Unknown time  . mometasone (NASONEX) 50 MCG/ACT nasal spray Place 2 sprays into the nose daily as needed (for rhinitis).   unknown  . naphazoline-glycerin (CLEAR EYES) 0.012-0.2 % SOLN Place 1-2 drops into both eyes every 4 (four) hours as needed for irritation.    unknown  . omega-3 acid ethyl esters (LOVAZA) 1 g capsule Take 1 g by mouth daily.   07/06/2016 at Unknown time  . omeprazole (PRILOSEC) 40 MG capsule Take 1 capsule (40 mg total) by mouth daily. (Patient taking differently: Take 40 mg by mouth daily as needed (for acid reflux). ) 30 capsule 0 unknown  . polyethylene glycol (MIRALAX / GLYCOLAX) packet Take 17 g by mouth daily as needed for mild constipation.   07/06/2016 at Unknown time  . pregabalin (LYRICA) 300 MG capsule Take 1 capsule (300 mg total) by mouth 2 (two) times daily. 60 capsule 0 07/06/2016 at Unknown time  . rOPINIRole (REQUIP) 0.5 MG tablet Take 1 tablet (0.5 mg total) by mouth 3 (three) times daily. 90 tablet 0 07/06/2016 at Unknown time  . valACYclovir (VALTREX) 500 MG tablet Take 500 mg by mouth 2 (two) times daily.   07/06/2016 at Unknown  time   Scheduled: . atorvastatin  40 mg Oral QHS  . doxepin  100 mg Oral QHS  . enoxaparin (LOVENOX) injection  40 mg Subcutaneous Q24H  . ferrous sulfate  325 mg Oral Daily  . guaiFENesin  1,200 mg Oral BID  . insulin aspart  0-9 Units Subcutaneous TID WC  . levothyroxine  75 mcg Oral QAC breakfast  . loratadine  10 mg Oral Daily  . pantoprazole  40 mg Oral Daily  . pregabalin  300 mg Oral BID  . rOPINIRole  0.5 mg Oral TID  . valACYclovir  500 mg Oral BID   Continuous: . sodium chloride 125 mL/hr at 07/08/16 0215  . ceFEPime (MAXIPIME) IV Stopped (07/07/16 2330)  . vancomycin 1,000 mg (07/08/16 0630)   DJS:[id number] **OR** acetaminophen, albuterol, clonazePAM, HYDROcodone-acetaminophen, ibuprofen, naphazoline-glycerin, ondansetron **OR** ondansetron (ZOFRAN) IV, polyethylene glycol  Assesment: She was admitted with cellulitis of the great toe. She has an open area at the tip of the right great toe. She is much improved. Additionally she had acute on chronic hypoxic and hypercapnic respiratory failure requiring BiPAP briefly. She is much better. She's ready for discharge and I think she can go home on oral antibiotics. At baseline she has diabetes and that's been pretty well controlled. Active Problems:   Hypothyroid  Lumbago   Chronic airway obstruction (HCC)   Anxiety   Diabetes mellitus ([place])   Cellulitis   Cellulitis of great toe   Acute respiratory failure with hypoxia (HCC)   Acute respiratory failure with hypercapnia ([place])    Plan: She will receive her dose of IV antibiotics this morning and then be discharged.    LOS: 1 day   Markan Cazarez L 07/08/2016, 7:32 AM

## 2016-07-08 NOTE — Progress Notes (Signed)
D/C instructions and paperwork given to patient. Patient aware to [facility]. IV catheter removed from RIGHT FA, intact w/no s/s of infection and/or infiltration noted at this time. Patient confirmed she had all of her belongings gathered to leave with her. Patient transported to vehicle via w/c, brother to transport patient home.

## 2016-07-09 LAB — URINE CULTURE

## 2016-07-12 LAB — CULTURE, BLOOD (ROUTINE X 2)
CULTURE: NO GROWTH
CULTURE: NO GROWTH
Special Requests: ADEQUATE

## 2016-07-22 DIAGNOSIS — J441 Chronic obstructive pulmonary disease with (acute) exacerbation: Secondary | ICD-10-CM | POA: Diagnosis not present

## 2016-08-04 ENCOUNTER — Other Ambulatory Visit (HOSPITAL_COMMUNITY): Payer: Self-pay | Admitting: Pulmonary Disease

## 2016-08-04 DIAGNOSIS — Z0000 Encounter for general adult medical examination without abnormal findings: Secondary | ICD-10-CM | POA: Diagnosis not present

## 2016-08-04 DIAGNOSIS — Z780 Asymptomatic menopausal state: Secondary | ICD-10-CM

## 2016-08-12 ENCOUNTER — Inpatient Hospital Stay (HOSPITAL_COMMUNITY): Admission: RE | Admit: 2016-08-12 | Payer: Self-pay | Source: Ambulatory Visit

## 2016-08-22 DIAGNOSIS — J441 Chronic obstructive pulmonary disease with (acute) exacerbation: Secondary | ICD-10-CM | POA: Diagnosis not present

## 2016-08-25 ENCOUNTER — Telehealth: Payer: Self-pay | Admitting: Nurse Practitioner

## 2016-08-25 ENCOUNTER — Other Ambulatory Visit (HOSPITAL_COMMUNITY): Payer: Self-pay

## 2016-08-25 ENCOUNTER — Encounter: Payer: Self-pay | Admitting: Nurse Practitioner

## 2016-08-25 ENCOUNTER — Encounter (HOSPITAL_COMMUNITY): Payer: Self-pay

## 2016-08-25 ENCOUNTER — Ambulatory Visit: Payer: Medicare Other | Admitting: Nurse Practitioner

## 2016-08-25 NOTE — Telephone Encounter (Signed)
PATIENT WAS A NO SHOW AND LETTER SENT  °

## 2016-08-25 NOTE — Telephone Encounter (Signed)
Noted  

## 2016-09-02 ENCOUNTER — Other Ambulatory Visit (HOSPITAL_COMMUNITY): Payer: Self-pay

## 2016-09-22 DIAGNOSIS — J441 Chronic obstructive pulmonary disease with (acute) exacerbation: Secondary | ICD-10-CM | POA: Diagnosis not present

## 2016-09-30 ENCOUNTER — Ambulatory Visit (HOSPITAL_COMMUNITY)
Admission: RE | Admit: 2016-09-30 | Discharge: 2016-09-30 | Disposition: A | Discharge status: Home / Self Care | Payer: Medicare Other | Source: Ambulatory Visit | Attending: Pulmonary Disease | Admitting: Pulmonary Disease

## 2016-09-30 DIAGNOSIS — M85851 Other specified disorders of bone density and structure, right thigh: Secondary | ICD-10-CM | POA: Diagnosis not present

## 2016-09-30 DIAGNOSIS — Z780 Asymptomatic menopausal state: Secondary | ICD-10-CM

## 2016-09-30 DIAGNOSIS — M8580 Other specified disorders of bone density and structure, unspecified site: Secondary | ICD-10-CM | POA: Diagnosis not present

## 2016-09-30 DIAGNOSIS — N951 Menopausal and female climacteric states: Secondary | ICD-10-CM | POA: Insufficient documentation

## 2016-10-22 DIAGNOSIS — J441 Chronic obstructive pulmonary disease with (acute) exacerbation: Secondary | ICD-10-CM | POA: Diagnosis not present

## 2016-10-27 ENCOUNTER — Encounter: Payer: Self-pay | Admitting: Gastroenterology

## 2016-10-27 ENCOUNTER — Ambulatory Visit (INDEPENDENT_AMBULATORY_CARE_PROVIDER_SITE_OTHER): Payer: Medicare Other | Admitting: Gastroenterology

## 2016-10-27 ENCOUNTER — Telehealth: Payer: Self-pay

## 2016-10-27 ENCOUNTER — Other Ambulatory Visit: Payer: Self-pay

## 2016-10-27 DIAGNOSIS — K219 Gastro-esophageal reflux disease without esophagitis: Secondary | ICD-10-CM

## 2016-10-27 DIAGNOSIS — R1013 Epigastric pain: Secondary | ICD-10-CM

## 2016-10-27 DIAGNOSIS — R1310 Dysphagia, unspecified: Secondary | ICD-10-CM | POA: Insufficient documentation

## 2016-10-27 DIAGNOSIS — R1319 Other dysphagia: Secondary | ICD-10-CM

## 2016-10-27 DIAGNOSIS — D509 Iron deficiency anemia, unspecified: Secondary | ICD-10-CM

## 2016-10-27 MED ORDER — PEG 3350-KCL-NA BICARB-NACL 420 G PO SOLR: 4000.0000 mL | ORAL | 0 refills | Status: DC

## 2016-10-27 NOTE — Telephone Encounter (Signed)
Tried to call pt to inform of pre-op aap 11/30/16 at 12:45pm, LMOVM. Letter also mailed.

## 2016-10-27 NOTE — Patient Instructions (Signed)
1. Colonoscopy and upper endoscopy as scheduled. Please see separate instructions. 

## 2016-10-27 NOTE — Assessment & Plan Note (Signed)
56 year old female with multiple medical problems as outlined above evaluation of iron deficiency anemia.  No GI bleeding.  No recent Hemoccults.  He is on aspirin and-regularly.  She has epigastric pain on exam.  Complains of solid food/pill dysphagia no prior colonoscopy.  Recommend colonoscopy and EGD/ED with Dr. Jena Gaussourk in the near future.  Sedation in the OR due to polypharmacy.  I have discussed the risks, alternatives, benefits with regards to but not limited to the risk of reaction to medication, bleeding, infection, perforation and the patient is agreeable to proceed. Written consent to be obtained.

## 2016-10-27 NOTE — Progress Notes (Signed)
Primary Care Physician:  [name], MD  Primary Gastroenterologist:  Roetta Sessions, MD   Chief Complaint  Patient presents with  . abnormal labs    HPI:  Patricia Bass is a 56 y.o. female here for further further evaluation of iron deficiency anemia.  She was hospitalized back in June with pneumonia.  She was noted to have a hemoglobin of 9.7 hematocrit 32 at that time. MCV 82.  Iron was 9.  TIBC stain, iron saturation was 3%.  Occult status unknown.  She does recall having Hemoccults last year which were negative.  Denies overt melena or rectal bleeding.  Weight down 15 pounds.  Bowel movements denies abdominal pain.  Has dysphagia heartburn well controlled on omeprazole but she tends to skip a dose every once in a while when she can.  History of remote ulcers.  No prior colonoscopy.  Admits to NSAID use ibuprofen 800 mg up to 3 times daily.  Also on aspirin 81 mg daily      Current Outpatient Prescriptions  Medication Sig Dispense Refill  . albuterol (PROVENTIL HFA;VENTOLIN HFA) 108 (90 BASE) MCG/ACT inhaler Inhale 2 puffs into the lungs every 6 (six) hours as needed for wheezing or shortness of breath.     [name] aspirin 81 MG chewable tablet Chew by mouth daily.    [name] atorvastatin (LIPITOR) 40 MG tablet Take 40 mg by mouth at bedtime.    . cetirizine (ZYRTEC) 10 MG tablet Take 10 mg by mouth daily as needed for allergies.     . CHANTIX 1 MG tablet Take 1 tablet by mouth 2 (two) times daily.    . clonazePAM (KLONOPIN) 1 MG tablet Take 1-2 mg by mouth 2 (two) times daily. Take 1 tablet in the morning and 2 tablets at bedtime.    [name] doxepin (SINEQUAN) 100 MG capsule Take 100 mg by mouth at bedtime.    [name] HYDROcodone-acetaminophen (NORCO) 10-325 MG per tablet Take 1 tablet by mouth every 6 (six) hours as needed for moderate pain.     . hydrocortisone valerate cream (WESTCORT) 0.2 % Apply 1 application topically daily as needed for rash.    . ibuprofen (ADVIL,MOTRIN) 800 MG tablet Take 800  mg by mouth every 8 (eight) hours as needed for mild pain or moderate pain.     [name] levothyroxine (SYNTHROID, LEVOTHROID) 75 MCG tablet Take 1 tablet (75 mcg total) by mouth daily before breakfast. 30 tablet 0  . lidocaine (XYLOCAINE) 5 % ointment Apply 1 application topically 4 (four) times daily as needed for mild pain.    [name] lisinopril (PRINIVIL,ZESTRIL) 2.5 MG tablet Take 2.5 mg by mouth daily.    . metFORMIN (GLUCOPHAGE) 500 MG tablet Take 1 tablet (500 mg total) by mouth 2 (two) times daily with a meal. 60 tablet 0  . mometasone (NASONEX) 50 MCG/ACT nasal spray Place 2 sprays into the nose daily as needed (for rhinitis).    . naphazoline-glycerin (CLEAR EYES) 0.012-0.2 % SOLN Place 1-2 drops into both eyes every 4 (four) hours as needed for irritation.     [name] omega-3 acid ethyl esters (LOVAZA) 1 g capsule Take 1 g by mouth daily.    [name] omeprazole (PRILOSEC) 40 MG capsule Take 1 capsule (40 mg total) by mouth daily. (Patient taking differently: Take 40 mg by mouth daily as needed (for acid reflux). ) 30 capsule 0  . polyethylene glycol (MIRALAX / GLYCOLAX) packet Take 17 g by mouth daily as needed for mild constipation.    [name]  pregabalin (LYRICA) 300 MG capsule Take 1 capsule (300 mg total) by mouth 2 (two) times daily. 60 capsule 0  . rOPINIRole (REQUIP) 0.5 MG tablet Take 1 tablet (0.5 mg total) by mouth 3 (three) times daily. 90 tablet 0  . valACYclovir (VALTREX) 500 MG tablet Take 500 mg by mouth 2 (two) times daily.     No current facility-administered medications for this visit.     Allergies as of 10/27/2016 - Review Complete 10/27/2016  Allergen Reaction Noted  . Duragesic disc transdermal system [fentanyl] Nausea And Vomiting 03/19/2011    Past Medical History:  Diagnosis Date  . Anxiety   . Bulging disc   . Carpal tunnel syndrome   . Cervicalgia   . Chronic airway obstruction (HCC)   . COPD (chronic obstructive pulmonary disease) (HCC)   . Coronary artery disease   . Diabetes  mellitus (HCC) 01/11/2012  . GERD (gastroesophageal reflux disease)   . Headache(784.0)   . Hypothyroid   . Lumbago   . Migraine   . Myalgia and myositis, unspecified   . Neuropathy   . Pneumonia   . Restless leg syndrome   . Shortness of breath   . Sleep apnea     Past Surgical History:  Procedure Laterality Date  . BLADDER SUSPENSION    . CARDIAC CATHETERIZATION    . CHOLECYSTECTOMY    . OOPHORECTOMY     patient not aware  . TUBAL LIGATION    . ULNAR NERVE TRANSPOSITION  04/01/2011   Procedure: ULNAR NERVE DECOMPRESSION/TRANSPOSITION;  Surgeon: Dominica Severin, MD;  Location: [facility] OR;  Service: Orthopedics;  Laterality: Left;  ULNAR NERVE RELEASE WITH ANTERIOR TRANS POSITION REPAIR RECONSTRUCTION AS NECESSARY    Family History  Problem Relation Age of Onset  . Colon cancer Neg Hx   . Celiac disease Neg Hx     Social History   Social History  . Marital status: Divorced    Spouse name: N/A  . Number of children: N/A  . Years of education: N/A   Occupational History  . Not on file.   Social History Main Topics  . Smoking status: Current Every Day Smoker    Packs/day: 2.00    Years: 20.00    Types: Cigarettes, E-cigarettes  . Smokeless tobacco: Never Used  . Alcohol use No  . Drug use: Yes    Types: Hydrocodone, Marijuana     Comment: occ  . Sexual activity: No   Other Topics Concern  . Not on file   Social History Narrative  . No narrative on file      ROS:  General: Negative for anorexia, weight loss, fever, chills, fatigue, weakness. Eyes: Negative for vision changes.  ENT: Negative for hoarseness, difficulty swallowing , nasal congestion. CV: Negative for chest pain, angina, palpitations, dyspnea on exertion, peripheral edema.  Respiratory: Negative for dyspnea at rest, dyspnea on exertion, cough, sputum, wheezing.  GI: See history of present illness. GU:  Negative for dysuria, hematuria, urinary incontinence, urinary frequency, nocturnal urination.   MS: Chronic neck and back pain  Dermatology: No rash neuro: Negative for weakness, abnormal sensation, seizure, frequent headaches, memory loss, confusion.  Psych: Negative for anxiety, depression, suicidal ideation, hallucinations.  Endo: Negative for unusual weight change.  Heme: Negative for bruising or bleeding. Allergy: Negative for rash or hives.    Physical Examination:  BP 110/72   Pulse 98   Temp (!) 97.4 F (36.3 C) (Oral)   Ht 5' (1.524 m)   Wt  166 lb 3.2 oz (75.4 kg)   BMI 32.46 kg/m    General: Well-nourished, well-developed in no acute distress.  Appears older than stated age Head: Normocephalic, atraumatic.   Eyes: Conjunctiva pink, no icterus. Mouth: Oropharyngeal mucosa moist and pink , no lesions erythema or exudate. Neck: Supple without thyromegaly, masses, or lymphadenopathy.  Lungs: Scattered wheezing bilaterally, no rhonchi or rales   Heart: Regular rate and rhythm, no murmurs rubs or gallops.  Abdomen: Bowel sounds are normal, epigastric tender, nondistended, no hepatosplenomegaly or masses, no abdominal bruits or    hernia , no rebound or guarding.   Rectal: Not performed Extremities: No lower extremity edema. No clubbing or deformities.  Neuro: Alert and oriented x 4 , grossly normal neurologically.  Skin: Warm and dry, no rash or jaundice.   Psych: Alert and cooperative, normal mood and affect.  Labs: Lab Results  Component Value Date   CREATININE 0.55 07/07/2016   BUN 8 07/07/2016   NA 136 07/07/2016   K 3.6 07/07/2016   CL 101 07/07/2016   CO2 27 07/07/2016   Lab Results  Component Value Date   ALT 32 07/07/2016   AST 39 07/07/2016   ALKPHOS 94 07/07/2016   BILITOT 0.4 07/07/2016   Lab Results  Component Value Date   WBC 11.9 (H) 07/07/2016   HGB 9.2 (L) 07/07/2016   HCT 30.8 (L) 07/07/2016   MCV 85.8 07/07/2016   PLT 248 07/07/2016   Lab Results  Component Value Date   IRON 9 (L) 06/22/2016   TIBC 316 06/22/2016   No  results found for: VITAMINB12 No results found for: FOLATE   Imaging Studies: Dg Bone Density (dxa)  Result Date: 09/30/2016 EXAM: DUAL X-RAY ABSORPTIOMETRY (DXA) FOR BONE MINERAL DENSITY IMPRESSION: Ordering Physician:  Dr. Kari BaarsEDWARD HAWKINS, Your patient Patricia Bass completed a BMD test on 09/30/2016 using the Lunar Prodigy DXA System (software version: 14.10) manufactured by [employer] LUNAR. The following summarizes the results of our evaluation. PATIENT BIOGRAPHICAL: Name: Patricia Bass, Patricia Bass Patient ID: [id number] Birth Date: 1960-02-25 Height: 60.0 in. Gender: Female Exam Date: 09/30/2016 Weight: 165.0 lbs. Indications: Caucasian, Height Loss, Post Menopausal, Tobacco User, Tobacco User (Current Smoker), Secondary Osteoporosis Fractures: Treatments: Asprin, Multivitamin, Synthroid DENSITOMETRY RESULTS: Site      Region     Measured Date Measured Age WHO Classification Young Adult T-score BMD         %Change vs. Previous Significant Change (*) AP Spine L1-L4 (L3) 09/30/2016 56.2 Normal -0.4 1.116 g/cm2 - - DualFemur Neck Right 09/30/2016 57.2 Osteopenia -1.1 0.885 g/cm2 - - ASSESSMENT: BMD as determined from Femur Neck Right is 0.885 g/cm2 with a T-Score of -1.1. This patient's diagnostic category is considered LOW BONE MASS/OSTEOPENIA according to World Health Organization [facility](WHO) criteria.(L-3 was excluded due to advanced degenerative changes.) World Health Organization [facility](WHO) criteria for post-menopausal, Caucasian Women: Normal:       T-score at or above -1 SD Osteopenia:   T-score between -1 and -2.5 SD Osteoporosis: T-score at or below -2.5 SD RECOMMENDATIONS: National Osteoporosis Foundation recommends that FDA-approved medial therapies be considered in postmenopausal women and men age 57 or older with a: 1. Hip or vertebral (clinical or morphometric) fracture. 2. T-Score of < -2.5 at the spine or hip. 3. Ten-year fracture probability by FRAX of 3% or greater for hip fracture or 20% or greater for  major osteoporotic fracture. All treatment decisions require clinical judgment and consideration of individual patient factors, including patient preferences, co-morbidities, previous  drug use, risk factors not captured in the FRAX model (e.g. falls, vitamin D deficiency, increased bone turnover, interval significant decline in bone density) and possible under-or over-estimation of fracture risk by FRAX. All patients should ensure an adequate intake of dietary calcium (1200 mg/d) and vitamin D (800 IU daily) unless contraindicated. FOLLOW-UP: People with diagnosed cases of osteoporosis or osteopenia should be regularly tested for bone mineral density. For patients eligible for Medicare, routine testing is allowed once every 2 years. Testing frequency can be increased for patients who have rapidly progressing disease, or for those who are receiving medical therapy to restore bone mass. I have reviewed this report, and agree with the above findings. [facility] Dear Dr. [name], Your patient Patricia Bass completed a FRAX assessment on 09/30/2016 using the Lunar Prodigy DXA System (analysis version: 14.10) manufactured by [employer]. The following summarizes the results of our evaluation. PATIENT BIOGRAPHICAL: Name: Patricia Bass, Patricia Bass Patient ID: [id number] Birth Date: 04-16-1960 Height:    60.0 in. Gender:     [age]    [age]       Weight:    165.0 lbs. Ethnicity:  White                            Exam Date: 09/30/2016 FRAX* RESULTS:  (version: 3.5) 10-year Probability of Fracture1 Major Osteoporotic Fracture2 Hip Fracture 6.1% 0.6% Population: [place] (Caucasian) Risk Factors: Tobacco User (Current Smoker), Secondary Osteoporosis Based on Femur (Right) Neck BMD 1 -The 10-year probability of fracture may be lower than reported if the patient has received treatment. 2 -Major Osteoporotic Fracture: Clinical Spine, Forearm, Hip or Shoulder *FRAX is a Armed forces logistics/support/administrative officer of the [employer] for Metabolic Bone Disease, a World Science writer (WHO) [employer]. ASSESSMENT: The probability of a major osteoporotic fracture is 6.1% within the next ten years. The probability of a hip fracture is 0.6% within the next ten years. Electronically Signed   By: Harmon Pier M.D.   On: 09/30/2016 14:19

## 2016-10-28 NOTE — Progress Notes (Signed)
cc'd to pcp 

## 2016-11-01 DIAGNOSIS — Z23 Encounter for immunization: Secondary | ICD-10-CM | POA: Diagnosis not present

## 2016-11-01 DIAGNOSIS — Z79891 Long term (current) use of opiate analgesic: Secondary | ICD-10-CM | POA: Diagnosis not present

## 2016-11-22 DIAGNOSIS — J441 Chronic obstructive pulmonary disease with (acute) exacerbation: Secondary | ICD-10-CM | POA: Diagnosis not present

## 2016-11-24 NOTE — Patient Instructions (Signed)
Patricia Bass  11/24/2016     @PREFPERIOPPHARMACY @   Your procedure is scheduled on 12/06/2016.  Report to [facility] at 8:00 A.M.  Call this number if you have problems the morning of surgery:  [phone]   Remember:  Do not eat food or drink liquids after midnight.  Take these medicines the morning of surgery with A SIP OF WATER Albuterol inhaler and bring with you, Zyrtec, Klonopin, Sinequan, Hydrocodone if  Needed, Synthroid, Lisinopril, Prilosec, Lyrica, Requip   Do not wear jewelry, make-up or nail polish.  Do not wear lotions, powders, or perfumes, or deoderant.  Do not shave 48 hours prior to surgery.  Men may shave face and neck.  Do not bring valuables to the hospital.  [facility] is not responsible for any belongings or valuables.  Contacts, dentures or bridgework may not be worn into surgery.  Leave your suitcase in the car.  After surgery it may be brought to your room.  For patients admitted to the hospital, discharge time will be determined by your treatment team.  Patients discharged the day of surgery will not be allowed to drive home.    Please read over the following fact sheets that you were given. Anesthesia Post-op Instructions     PATIENT INSTRUCTIONS POST-ANESTHESIA  IMMEDIATELY FOLLOWING SURGERY:  Do not drive or operate machinery for the first twenty four hours after surgery.  Do not make any important decisions for twenty four hours after surgery or while taking narcotic pain medications or sedatives.  If you develop intractable nausea and vomiting or a severe headache please notify your doctor immediately.  FOLLOW-UP:  Please make an appointment with your surgeon as instructed. You do not need to follow up with anesthesia unless specifically instructed to do so.  WOUND CARE INSTRUCTIONS (if applicable):  Keep a dry clean dressing on the anesthesia/puncture wound site if there is drainage.  Once the wound has quit draining you may leave it  open to air.  Generally you should leave the bandage intact for twenty four hours unless there is drainage.  If the epidural site drains for more than 36-48 hours please call the anesthesia department.  QUESTIONS?:  Please feel free to call your physician or the hospital operator if you have any questions, and they will be happy to assist you.      Colonoscopy, Adult A colonoscopy is an exam to look at the entire large intestine. During the exam, a lubricated, bendable tube is inserted into the anus and then passed into the rectum, colon, and other parts of the large intestine. A colonoscopy is often done as a part of normal colorectal screening or in response to certain symptoms, such as anemia, persistent diarrhea, abdominal pain, and blood in the stool. The exam can help screen for and diagnose medical problems, including:  Tumors.  Polyps.  Inflammation.  Areas of bleeding.  Tell a health care provider about:  Any allergies you have.  All medicines you are taking, including vitamins, herbs, eye drops, creams, and over-the-counter medicines.  Any problems you or family members have had with anesthetic medicines.  Any blood disorders you have.  Any surgeries you have had.  Any medical conditions you have.  Any problems you have had passing stool. What are the risks? Generally, this is a safe procedure. However, problems may occur, including:  Bleeding.  A tear in the intestine.  A reaction to medicines given during the exam.  Infection (rare).  What  happens before the procedure? Eating and drinking restrictions Follow instructions from your health care provider about eating and drinking, which may include:  A few days before the procedure - follow a low-fiber diet. Avoid nuts, seeds, dried fruit, raw fruits, and vegetables.  1-3 days before the procedure - follow a clear liquid diet. Drink only clear liquids, such as clear broth or bouillon, black coffee or tea,  clear juice, clear soft drinks or sports drinks, gelatin dessert, and popsicles. Avoid any liquids that contain red or purple dye.  On the day of the procedure - do not eat or drink anything during the 2 hours before the procedure, or within the time period that your health care provider recommends.  Bowel prep If you were prescribed an oral bowel prep to clean out your colon:  Take it as told by your health care provider. Starting the day before your procedure, you will need to drink a large amount of medicated liquid. The liquid will cause you to have multiple loose stools until your stool is almost clear or light green.  If your skin or anus gets irritated from diarrhea, you may use these to relieve the irritation: ? Medicated wipes, such as adult wet wipes with aloe and vitamin E. ? A skin soothing-product like petroleum jelly.  If you vomit while drinking the bowel prep, take a break for up to 60 minutes and then begin the bowel prep again. If vomiting continues and you cannot take the bowel prep without vomiting, call your health care provider.  General instructions  Ask your health care provider about changing or stopping your regular medicines. This is especially important if you are taking diabetes medicines or blood thinners.  Plan to have someone take you home from the hospital or clinic. What happens during the procedure?  An IV tube may be inserted into one of your veins.  You will be given medicine to help you relax (sedative).  To reduce your risk of infection: ? Your health care team will wash or sanitize their hands. ? Your anal area will be washed with soap.  You will be asked to lie on your side with your knees bent.  Your health care provider will lubricate a long, thin, flexible tube. The tube will have a camera and a light on the end.  The tube will be inserted into your anus.  The tube will be gently eased through your rectum and colon.  Air will be  delivered into your colon to keep it open. You may feel some pressure or cramping.  The camera will be used to take images during the procedure.  A small tissue sample may be removed from your body to be examined under a microscope (biopsy). If any potential problems are found, the tissue will be sent to a lab for testing.  If small polyps are found, your health care provider may remove them and have them checked for cancer cells.  The tube that was inserted into your anus will be slowly removed. The procedure may vary among health care providers and hospitals. What happens after the procedure?  Your blood pressure, heart rate, breathing rate, and blood oxygen level will be monitored until the medicines you were given have worn off.  Do not drive for 24 hours after the exam.  You may have a small amount of blood in your stool.  You may pass gas and have mild abdominal cramping or bloating due to the air that was used to  inflate your colon during the exam.  It is up to you to get the results of your procedure. Ask your health care provider, or the department performing the procedure, when your results will be ready. This information is not intended to replace advice given to you by your health care provider. Make sure you discuss any questions you have with your health care provider. Document Released: 12/19/1999 Document Revised: 10/22/2015 Document Reviewed: 03/04/2015 Elsevier Interactive Patient Education  2018 [employer]. Esophageal Dilatation Esophageal dilatation is a procedure to open a blocked or narrowed part of the esophagus. The esophagus is the long tube in your throat that carries food and liquid from your mouth to your stomach. The procedure is also called esophageal dilation. You may need this procedure if you have a buildup of scar tissue in your esophagus that makes it difficult, painful, or even impossible to swallow. This can be caused by gastroesophageal reflux  disease (GERD). In rare cases, people need this procedure because they have cancer of the esophagus or a problem with the way food moves through the esophagus. Sometimes you may need to have another dilatation to enlarge the opening of the esophagus gradually. Tell a health care provider about:  Any allergies you have.  All medicines you are taking, including vitamins, herbs, eye drops, creams, and over-the-counter medicines.  Any problems you or family members have had with anesthetic medicines.  Any blood disorders you have.  Any surgeries you have had.  Any medical conditions you have.  Any antibiotic medicines you are required to take before dental procedures. What are the risks? Generally, this is a safe procedure. However, problems can occur and include:  Bleeding from a tear in the lining of the esophagus.  A hole (perforation) in the esophagus.  What happens before the procedure?  Do not eat or drink anything after midnight on the night before the procedure or as directed by your health care provider.  Ask your health care provider about changing or stopping your regular medicines. This is especially important if you are taking diabetes medicines or blood thinners.  Plan to have someone take you home after the procedure. What happens during the procedure?  You will be given a medicine that makes you relaxed and sleepy (sedative).  A medicine may be sprayed or gargled to numb the back of the throat.  Your health care provider can use various instruments to do an esophageal dilatation. During the procedure, the instrument used will be placed in your mouth and passed down into your esophagus. Options include: ? Simple dilators. This instrument is carefully placed in the esophagus to stretch it. ? Guided wire bougies. In this method, a flexible tube (endoscope) is used to insert a wire into the esophagus. The dilator is passed over this wire to enlarge the esophagus. Then  the wire is removed. ? Balloon dilators. An endoscope with a small balloon at the end is passed down into the esophagus. Inflating the balloon gently stretches the esophagus and opens it up. What happens after the procedure?  Your blood pressure, heart rate, breathing rate, and blood oxygen level will be monitored often until the medicines you were given have worn off.  Your throat may feel slightly sore and will probably still feel numb. This will improve slowly over time.  You will not be allowed to eat or drink until the throat numbness has resolved.  If this is a same-day procedure, you may be allowed to go home once you  have been able to drink, urinate, and sit on the edge of the bed without nausea or dizziness.  If this is a same-day procedure, you should have a friend or family member with you for the next 24 hours after the procedure. This information is not intended to replace advice given to you by your health care provider. Make sure you discuss any questions you have with your health care provider. Document Released: 02/11/2005 Document Revised: 05/29/2015 Document Reviewed: 05/02/2013 Elsevier Interactive Patient Education  2017 Elsevier Inc. Esophagogastroduodenoscopy Esophagogastroduodenoscopy (EGD) is a procedure to examine the lining of the esophagus, stomach, and first part of the small intestine (duodenum). This procedure is done to check for problems such as inflammation, bleeding, ulcers, or growths. During this procedure, a long, flexible, lighted tube with a camera attached (endoscope) is inserted down the throat. Tell a health care provider about:  Any allergies you have.  All medicines you are taking, including vitamins, herbs, eye drops, creams, and over-the-counter medicines.  Any problems you or family members have had with anesthetic medicines.  Any blood disorders you have.  Any surgeries you have had.  Any medical conditions you have.  Whether you are  pregnant or may be pregnant. What are the risks? Generally, this is a safe procedure. However, problems may occur, including:  Infection.  Bleeding.  A tear (perforation) in the esophagus, stomach, or duodenum.  Trouble breathing.  Excessive sweating.  Spasms of the larynx.  A slowed heartbeat.  Low blood pressure.  What happens before the procedure?  Follow instructions from your health care provider about eating or drinking restrictions.  Ask your health care provider about: ? Changing or stopping your regular medicines. This is especially important if you are taking diabetes medicines or blood thinners. ? Taking medicines such as aspirin and ibuprofen. These medicines can thin your blood. Do not take these medicines before your procedure if your health care provider instructs you not to.  Plan to have someone take you home after the procedure.  If you wear dentures, be ready to remove them before the procedure. What happens during the procedure?  To reduce your risk of infection, your health care team will wash or sanitize their hands.  An IV tube will be put in a vein in your hand or arm. You will get medicines and fluids through this tube.  You will be given one or more of the following: ? A medicine to help you relax (sedative). ? A medicine to numb the area (local anesthetic). This medicine may be sprayed into your throat. It will make you feel more comfortable and keep you from gagging or coughing during the procedure. ? A medicine for pain.  A mouth guard may be placed in your mouth to protect your teeth and to keep you from biting on the endoscope.  You will be asked to lie on your left side.  The endoscope will be lowered down your throat into your esophagus, stomach, and duodenum.  Air will be put into the endoscope. This will help your health care provider see better.  The lining of your esophagus, stomach, and duodenum will be examined.  Your health  care provider may: ? Take a tissue sample so it can be looked at in a lab (biopsy). ? Remove growths. ? Remove objects (foreign bodies) that are stuck. ? Treat any bleeding with medicines or other devices that stop tissue from bleeding. ? Widen (dilate) or stretch narrowed areas of your esophagus and stomach.  The endoscope will be taken out. The procedure may vary among health care providers and hospitals. What happens after the procedure?  Your blood pressure, heart rate, breathing rate, and blood oxygen level will be monitored often until the medicines you were given have worn off.  Do not eat or drink anything until the numbing medicine has worn off and your gag reflex has returned. This information is not intended to replace advice given to you by your health care provider. Make sure you discuss any questions you have with your health care provider. Document Released: 04/23/2004 Document Revised: 05/29/2015 Document Reviewed: 11/14/2014 Elsevier Interactive Patient Education  [employer]2018 Elsevier Inc.

## 2016-11-30 ENCOUNTER — Other Ambulatory Visit: Payer: Self-pay

## 2016-11-30 ENCOUNTER — Encounter (HOSPITAL_COMMUNITY): Payer: Self-pay

## 2016-11-30 ENCOUNTER — Encounter (HOSPITAL_COMMUNITY)
Admission: RE | Admit: 2016-11-30 | Discharge: 2016-11-30 | Disposition: A | Discharge status: Home / Self Care | Payer: Medicare Other | Source: Ambulatory Visit | Attending: Internal Medicine | Admitting: Internal Medicine

## 2016-11-30 DIAGNOSIS — Z01812 Encounter for preprocedural laboratory examination: Secondary | ICD-10-CM | POA: Insufficient documentation

## 2016-11-30 HISTORY — DX: Dorsalgia, unspecified: M54.9

## 2016-11-30 HISTORY — DX: Unspecified osteoarthritis, unspecified site: M19.90

## 2016-11-30 HISTORY — DX: Other chronic pain: G89.29

## 2016-11-30 HISTORY — DX: Anemia, unspecified: D64.9

## 2016-11-30 LAB — CBC WITH DIFFERENTIAL/PLATELET
BASOS ABS: 0 10*3/uL (ref 0.0–0.1)
BASOS PCT: 0 %
EOS ABS: 0.1 10*3/uL (ref 0.0–0.7)
Eosinophils Relative: 0 %
HEMATOCRIT: 40.9 % (ref 36.0–46.0)
Hemoglobin: 12.6 g/dL (ref 12.0–15.0)
Lymphocytes Relative: 30 %
Lymphs Abs: 3.5 10*3/uL (ref 0.7–4.0)
MCH: 29.6 pg (ref 26.0–34.0)
MCHC: 30.8 g/dL (ref 30.0–36.0)
MCV: 96.2 fL (ref 78.0–100.0)
MONO ABS: 0.5 10*3/uL (ref 0.1–1.0)
Monocytes Relative: 4 %
NEUTROS ABS: 7.4 10*3/uL (ref 1.7–7.7)
NEUTROS PCT: 66 %
Platelets: 218 10*3/uL (ref 150–400)
RBC: 4.25 MIL/uL (ref 3.87–5.11)
RDW: 15.4 % (ref 11.5–15.5)
WBC: 11.4 10*3/uL — ABNORMAL HIGH (ref 4.0–10.5)

## 2016-11-30 LAB — BASIC METABOLIC PANEL
ANION GAP: 9 (ref 5–15)
BUN: 13 mg/dL (ref 6–20)
CALCIUM: 9.3 mg/dL (ref 8.9–10.3)
CO2: 29 mmol/L (ref 22–32)
CREATININE: 0.72 mg/dL (ref 0.44–1.00)
Chloride: 102 mmol/L (ref 101–111)
GFR calc non Af Amer: >60 mL/min (ref >60)
Glucose, Bld: 113 mg/dL — ABNORMAL HIGH (ref 65–99)
Potassium: 3.4 mmol/L — ABNORMAL LOW (ref 3.5–5.1)
SODIUM: 140 mmol/L (ref 135–145)

## 2016-12-06 ENCOUNTER — Encounter (HOSPITAL_COMMUNITY)
Admission: RE | Disposition: A | Discharge status: Home / Self Care | Payer: Self-pay | Source: Ambulatory Visit | Attending: Internal Medicine

## 2016-12-06 ENCOUNTER — Ambulatory Visit (HOSPITAL_COMMUNITY)
Admission: RE | Admit: 2016-12-06 | Discharge: 2016-12-06 | Disposition: A | Discharge status: Home / Self Care | Payer: Medicare Other | Source: Ambulatory Visit | Attending: Internal Medicine | Admitting: Internal Medicine

## 2016-12-06 ENCOUNTER — Ambulatory Visit (HOSPITAL_COMMUNITY): Payer: Medicare Other | Admitting: Anesthesiology

## 2016-12-06 ENCOUNTER — Encounter (HOSPITAL_COMMUNITY): Payer: Self-pay | Admitting: Anesthesiology

## 2016-12-06 DIAGNOSIS — D509 Iron deficiency anemia, unspecified: Secondary | ICD-10-CM

## 2016-12-06 DIAGNOSIS — G2581 Restless legs syndrome: Secondary | ICD-10-CM | POA: Diagnosis not present

## 2016-12-06 DIAGNOSIS — F419 Anxiety disorder, unspecified: Secondary | ICD-10-CM | POA: Insufficient documentation

## 2016-12-06 DIAGNOSIS — G4730 Sleep apnea, unspecified: Secondary | ICD-10-CM | POA: Insufficient documentation

## 2016-12-06 DIAGNOSIS — Z79899 Other long term (current) drug therapy: Secondary | ICD-10-CM | POA: Insufficient documentation

## 2016-12-06 DIAGNOSIS — F17290 Nicotine dependence, other tobacco product, uncomplicated: Secondary | ICD-10-CM | POA: Insufficient documentation

## 2016-12-06 DIAGNOSIS — M549 Dorsalgia, unspecified: Secondary | ICD-10-CM | POA: Diagnosis not present

## 2016-12-06 DIAGNOSIS — J449 Chronic obstructive pulmonary disease, unspecified: Secondary | ICD-10-CM | POA: Diagnosis not present

## 2016-12-06 DIAGNOSIS — M609 Myositis, unspecified: Secondary | ICD-10-CM | POA: Insufficient documentation

## 2016-12-06 DIAGNOSIS — K3189 Other diseases of stomach and duodenum: Secondary | ICD-10-CM | POA: Diagnosis not present

## 2016-12-06 DIAGNOSIS — I2510 Atherosclerotic heart disease of native coronary artery without angina pectoris: Secondary | ICD-10-CM | POA: Insufficient documentation

## 2016-12-06 DIAGNOSIS — Z7984 Long term (current) use of oral hypoglycemic drugs: Secondary | ICD-10-CM | POA: Diagnosis not present

## 2016-12-06 DIAGNOSIS — G8929 Other chronic pain: Secondary | ICD-10-CM | POA: Diagnosis not present

## 2016-12-06 DIAGNOSIS — Z7982 Long term (current) use of aspirin: Secondary | ICD-10-CM | POA: Diagnosis not present

## 2016-12-06 DIAGNOSIS — E039 Hypothyroidism, unspecified: Secondary | ICD-10-CM | POA: Insufficient documentation

## 2016-12-06 DIAGNOSIS — Z9981 Dependence on supplemental oxygen: Secondary | ICD-10-CM | POA: Insufficient documentation

## 2016-12-06 DIAGNOSIS — R1310 Dysphagia, unspecified: Secondary | ICD-10-CM

## 2016-12-06 DIAGNOSIS — F17210 Nicotine dependence, cigarettes, uncomplicated: Secondary | ICD-10-CM | POA: Insufficient documentation

## 2016-12-06 DIAGNOSIS — K219 Gastro-esophageal reflux disease without esophagitis: Secondary | ICD-10-CM | POA: Diagnosis not present

## 2016-12-06 DIAGNOSIS — R1013 Epigastric pain: Secondary | ICD-10-CM

## 2016-12-06 DIAGNOSIS — K2950 Unspecified chronic gastritis without bleeding: Secondary | ICD-10-CM | POA: Diagnosis not present

## 2016-12-06 DIAGNOSIS — Z538 Procedure and treatment not carried out for other reasons: Secondary | ICD-10-CM | POA: Diagnosis not present

## 2016-12-06 DIAGNOSIS — K2960 Other gastritis without bleeding: Secondary | ICD-10-CM | POA: Diagnosis not present

## 2016-12-06 DIAGNOSIS — Z885 Allergy status to narcotic agent status: Secondary | ICD-10-CM | POA: Insufficient documentation

## 2016-12-06 DIAGNOSIS — E1140 Type 2 diabetes mellitus with diabetic neuropathy, unspecified: Secondary | ICD-10-CM | POA: Diagnosis not present

## 2016-12-06 HISTORY — PX: ESOPHAGOGASTRODUODENOSCOPY (EGD) WITH PROPOFOL: SHX5813

## 2016-12-06 HISTORY — PX: COLONOSCOPY WITH PROPOFOL: SHX5780

## 2016-12-06 HISTORY — PX: MALONEY DILATION: SHX5535

## 2016-12-06 HISTORY — PX: BIOPSY: SHX5522

## 2016-12-06 LAB — GLUCOSE, CAPILLARY
Glucose-Capillary: 104 mg/dL — ABNORMAL HIGH (ref 65–99)
Glucose-Capillary: 86 mg/dL (ref 65–99)

## 2016-12-06 SURGERY — COLONOSCOPY WITH PROPOFOL
Anesthesia: Monitor Anesthesia Care

## 2016-12-06 MED ORDER — PROPOFOL 10 MG/ML IV BOLUS
INTRAVENOUS | Status: AC
  Filled 2016-12-06: qty 40

## 2016-12-06 MED ORDER — LACTATED RINGERS IV SOLN
INTRAVENOUS | Status: DC
  Administered 2016-12-06: 08:00:00 via INTRAVENOUS

## 2016-12-06 MED ORDER — ONDANSETRON 4 MG PO TBDP
4.0000 mg | ORAL_TABLET | Freq: Once | ORAL | Status: AC
  Administered 2016-12-06: 4 mg via ORAL

## 2016-12-06 MED ORDER — CHLORHEXIDINE GLUCONATE CLOTH 2 % EX PADS: 6.0000 | MEDICATED_PAD | Freq: Once | CUTANEOUS | Status: DC

## 2016-12-06 MED ORDER — IPRATROPIUM-ALBUTEROL 0.5-2.5 (3) MG/3ML IN SOLN
RESPIRATORY_TRACT | Status: AC
  Filled 2016-12-06: qty 3

## 2016-12-06 MED ORDER — PHENYLEPHRINE HCL 10 MG/ML IJ SOLN
INTRAMUSCULAR | Status: DC | PRN
  Administered 2016-12-06: 40 ug via INTRAVENOUS
  Administered 2016-12-06: 80 ug via INTRAVENOUS
  Administered 2016-12-06: 40 ug via INTRAVENOUS

## 2016-12-06 MED ORDER — LIDOCAINE VISCOUS 2 % MT SOLN
OROMUCOSAL | Status: DC | PRN
  Administered 2016-12-06: 8 mL via OROMUCOSAL

## 2016-12-06 MED ORDER — CHLORHEXIDINE GLUCONATE CLOTH 2 % EX PADS
6.0000 | MEDICATED_PAD | Freq: Once | CUTANEOUS | Status: DC
Start: 2016-12-06 — End: 2016-12-06

## 2016-12-06 MED ORDER — PROPOFOL 500 MG/50ML IV EMUL
INTRAVENOUS | Status: DC | PRN
Start: 2016-12-06 — End: 2016-12-06
  Administered 2016-12-06: 100 ug/kg/min via INTRAVENOUS

## 2016-12-06 MED ORDER — ONDANSETRON 4 MG PO TBDP
ORAL_TABLET | ORAL | Status: AC
  Filled 2016-12-06: qty 1

## 2016-12-06 MED ORDER — PROPOFOL 10 MG/ML IV BOLUS
INTRAVENOUS | Status: DC | PRN
  Administered 2016-12-06: 10 mg via INTRAVENOUS

## 2016-12-06 MED ORDER — PHENYLEPHRINE 40 MCG/ML (10ML) SYRINGE FOR IV PUSH (FOR BLOOD PRESSURE SUPPORT)
PREFILLED_SYRINGE | INTRAVENOUS | Status: AC
  Filled 2016-12-06: qty 10

## 2016-12-06 MED ORDER — IPRATROPIUM-ALBUTEROL 0.5-2.5 (3) MG/3ML IN SOLN
3.0000 mL | Freq: Four times a day (QID) | RESPIRATORY_TRACT | Status: DC
  Administered 2016-12-06: 3 mL via RESPIRATORY_TRACT

## 2016-12-06 NOTE — Op Note (Signed)
[facility] Patient Name: Patricia CoffeeCarrie Goins Procedure Date: 12/06/2016 8:28 AM MRN: [id number] Date of Birth: [birth year] Attending MD: Gennette Pacobert Michael  , MD CSN: [id number] Age: [id number] Admit Type: Outpatient Procedure:                Upper GI endoscopy Indications:              Dysphagia Providers:                Gennette Pacobert Michael , MD, Criselda PeachesLurae B. Patsy LagerAlbert RN, RN,                            Burke Keelsrisann Tilley, Technician, Edythe ClarityKelly Cox, Technician Referring MD:              Medicines:                Propofol per Anesthesia Complications:            No immediate complications. Estimated Blood Loss:     Estimated blood loss was minimal. Procedure:                Pre-Anesthesia Assessment:                           - Prior to the procedure, a History and Physical                            was performed, and patient medications and                            allergies were reviewed. The patient's tolerance of                            previous anesthesia was also reviewed. The risks                            and benefits of the procedure and the sedation                            options and risks were discussed with the patient.                            All questions were answered, and informed consent                            was obtained. Prior Anticoagulants: The patient has                            taken no previous anticoagulant or antiplatelet                            agents. ASA Grade Assessment: III - A patient with                            severe systemic disease. After reviewing the risks  and benefits, the patient was deemed in                            satisfactory condition to undergo the procedure.                           After obtaining informed consent, the endoscope was                            passed under direct vision. Throughout the                            procedure, the patient's blood pressure, pulse, and                             oxygen saturations were monitored continuously. The                            [id number]) scope was introduced through the                            and advanced to the second part of duodenum. The                            upper GI endoscopy was accomplished without                            difficulty. The patient tolerated the procedure                            well. Scope In: 9:00:36 AM Scope Out: 9:08:08 AM Total Procedure Duration: 0 hours 7 minutes 32 seconds  Findings:      The examined esophagus was normal.      Mucosal changes were found in the stomach. some snakeskinning of the       gastric mucosa diffusely. No ulcer or infiltrating process seen.      The duodenal bulb and second portion of the duodenum were normal. The       scope was withdrawn. Dilation was performed with a Maloney dilator with       mild resistance at 54 Fr. The dilation site was examined following       endoscope reinsertion and showed a small tear through the UES and GE       junction. Estimated blood loss was minimal. Subsequently, biopsies of       the gastric mucosa taken. Impression:               - Normal esophagus. [facility]                            dilation.The patient may have had a submucosal ring                            and/or web                           -  Abnormal appearing gastric mucosa of uncertain                            significance?"status post biopsy.                           - Normal duodenal bulb and second portion of the                            duodenum.                           - Moderate Sedation:      Moderate (conscious) sedation was personally administered by an       anesthesia professional. The following parameters were monitored: oxygen       saturation, heart rate, blood pressure, respiratory rate, EKG, adequacy       of pulmonary ventilation, and response to care. Total physician       intraservice time was 15  minutes. Recommendation:           - Patient has a contact number available for                            emergencies. The signs and symptoms of potential                            delayed complications were discussed with the                            patient. Return to normal activities tomorrow.                            Written discharge instructions were provided to the                            patient.                           - Resume previous diet.                           - Continue present medications.                           - Await pathology results.                           - No repeat upper endoscopy.                           - Return to GI office after studies are complete.                            See colonoscopy report. Procedure Code(s):        --- Professional ---  13244, Esophagogastroduodenoscopy, flexible,                            transoral; diagnostic, including collection of                            specimen(s) by brushing or washing, when performed                            (separate procedure)                           43450, Dilation of esophagus, by unguided sound or                            bougie, single or multiple passes Diagnosis Code(s):        --- Professional ---                           K31.89, Other diseases of stomach and duodenum                           R13.10, Dysphagia, unspecified CPT copyright 2016 American Medical Association. All rights reserved. The codes documented in this report are preliminary and upon coder review may  be revised to meet current compliance requirements. Gerrit Friends. , MD Gennette Pac, MD 12/06/2016 9:24:03 AM This report has been signed electronically. Number of Addenda: 0

## 2016-12-06 NOTE — Op Note (Signed)
[name] Patient Name: Patricia CoffeeCarrie Bass Procedure Date: 12/06/2016 9:10 AM MRN: [id number] Date of Birth: 1960/01/24 Attending MD: Gennette Pacobert Michael Rourk , MD CSN: [id number] Age: 56 Admit Type: Outpatient Procedure:                Colonoscopy (attempted - FS) Indications:              Unexplained iron deficiency anemia Providers:                Gennette Pacobert Michael Rourk, MD, Brain HiltsLurae Albert RN, RN,                            Burke Keelsrisann Tilley, Technician, Edythe ClarityKelly Cox, Technician Referring MD:              Medicines:                Propofol per Anesthesia Complications:            No immediate complications. Estimated Blood Loss:     Estimated blood loss: none. Procedure:                Pre-Anesthesia Assessment:                           - Prior to the procedure, a History and Physical                            was performed, and patient medications and                            allergies were reviewed. The patient's tolerance of                            previous anesthesia was also reviewed. The risks                            and benefits of the procedure and the sedation                            options and risks were discussed with the patient.                            All questions were answered, and informed consent                            was obtained. Prior Anticoagulants: The patient has                            taken no previous anticoagulant or antiplatelet                            agents. ASA Grade Assessment: III - A patient with                            severe systemic disease. After reviewing the risks  and benefits, the patient was deemed in                            satisfactory condition to undergo the procedure.                           After obtaining informed consent, the colonoscope                            was passed under direct vision. Throughout the                            procedure, the patient's blood pressure,  pulse, and                            oxygen saturations were monitored continuously. The                            EC-[id number] ([id number]) scope was introduced through                            the anus and advanced to the the sigmoid segment.                            Formed stool in the sigmoid precluded complete                            colonoscopy. The quality of the bowel preparation                            was inadequate. No anatomical landmarks were                            photographed. Scope In: 9:13:53 AM Scope Out: 9:15:25 AM Total Procedure Duration: 0 hours 1 minute 32 seconds  Findings:      The perianal and digital rectal examinations were normal. Formed stool       in the rectum and sigmoid precluded carrying out a complete colonoscopy. Impression:               - Preparation of the colon was inadequate.                            Procedure not completed                           - No specimens collected. Moderate Sedation:      Moderate (conscious) sedation was personally administered by an       anesthesia professional. The following parameters were monitored: oxygen       saturation, heart rate, blood pressure, respiratory rate, EKG, adequacy       of pulmonary ventilation, and response to care. Total physician       intraservice time was 9 minutes. Recommendation:           - Patient has a contact number available for  emergencies. The signs and symptoms of potential                            delayed complications were discussed with the                            patient. Return to normal activities tomorrow.                            Written discharge instructions were provided to the                            patient.                           - Resume previous diet. see EGD report. Patient to                            return to the office to regroup and make plans for                            second attempt at colonoscopy                            - Repeat colonoscopy in 2 months [reason]. Procedure Code(s):        --- Professional ---                           [id number], 53, Colonoscopy, flexible; diagnostic,                            including collection of specimen(s) by brushing or                            washing, when performed (separate procedure) Diagnosis Code(s):        --- Professional ---                           D50.9, Iron deficiency anemia, unspecified CPT copyright 2016 American Medical Association. All rights reserved. The codes documented in this report are preliminary and upon coder review may  be revised to meet current compliance requirements. Gerrit Friendsobert M. Rourk, MD Gennette Pacobert Michael Rourk, MD 12/06/2016 10:04:03 AM This report has been signed electronically. Number of Addenda: 0

## 2016-12-06 NOTE — Transfer of Care (Signed)
Immediate Anesthesia Transfer of Care Note  Patient: Patricia Bass  Procedure(s) Performed: COLONOSCOPY WITH PROPOFOL (N/A ) ESOPHAGOGASTRODUODENOSCOPY (EGD) WITH PROPOFOL (N/A ) MALONEY DILATION (N/A ) BIOPSY  Patient Location: PACU  Anesthesia Type:MAC  Level of Consciousness: awake  Airway & Oxygen Therapy: Patient Spontanous Breathing  Post-op Assessment: Report given to RN  Post vital signs: Reviewed and stable  Last Vitals:  Vitals:   12/06/16 0840 12/06/16 0845  BP: (!) 89/61   Pulse:    Resp: 10 12  Temp:    SpO2: 97% 96%    Last Pain:  Vitals:   12/06/16 0854  TempSrc:   PainSc: 5       Patients Stated Pain Goal: 6 (09/31/12 1624)  Complications: No apparent anesthesia complications

## 2016-12-06 NOTE — H&P (Signed)
 @LOGO @   Primary Care Physician:  Kari BaarsHawkins, Edward, MD Primary Gastroenterologist:  Dr. Jena Gaussourk  Pre-Procedure History & Physical: HPI:  Patricia Bass is a 56 y.o. female here for further evaluation of dysphagia and iron deficiency anemia. EGD with ED and colonoscopy planned today.  Past Medical History:  Diagnosis Date  . Anemia   . Anxiety   . Arthritis   . Bulging disc   . Carpal tunnel syndrome   . Cervicalgia   . Chronic airway obstruction (HCC)   . Chronic back pain   . COPD (chronic obstructive pulmonary disease) (HCC)   . Coronary artery disease   . Diabetes mellitus (HCC) 01/11/2012  . GERD (gastroesophageal reflux disease)   . Headache(784.0)   . Hypothyroid   . Lumbago   . Migraine   . Myalgia and myositis, unspecified   . Neuropathy   . Pneumonia   . Restless leg syndrome   . Shortness of breath   . Sleep apnea    uses O2 at home. uses it at home all times and at night at 2 liters.    Past Surgical History:  Procedure Laterality Date  . BLADDER SUSPENSION    . CARDIAC CATHETERIZATION    . CHOLECYSTECTOMY    . TUBAL LIGATION    . ULNAR NERVE TRANSPOSITION  04/01/2011   Procedure: ULNAR NERVE DECOMPRESSION/TRANSPOSITION;  Surgeon: Dominica SeverinWilliam Gramig, MD;  [facility];  Service: Orthopedics;  Laterality: Left;  ULNAR NERVE RELEASE WITH ANTERIOR TRANS POSITION REPAIR RECONSTRUCTION AS NECESSARY    Prior to Admission medications   Medication Sig Start Date End Date Taking? Authorizing Provider  albuterol (PROVENTIL HFA;VENTOLIN HFA) 108 (90 BASE) MCG/ACT inhaler Inhale 2 puffs into the lungs every 6 (six) hours as needed for wheezing or shortness of breath.    Yes [provider]  aspirin 81 MG chewable tablet Chew 81 mg 2 (two) times a week by mouth.    Yes [provider]  atorvastatin (LIPITOR) 40 MG tablet Take 40 mg by mouth at bedtime.   Yes [provider]  BIOTIN PO Take 1 tablet 3 (three) times a week by mouth.   Yes [provider]  cetirizine (ZYRTEC) 10 MG tablet Take 10 mg daily by mouth.    Yes Kari BaarsHawkins, Edward, MD  CHANTIX 1 MG tablet Take 1 tablet by mouth 2 (two) times daily. 10/19/16  Yes [provider]  clonazePAM (KLONOPIN) 1 MG tablet Take 0.5-2 mg 3 (three) times daily by mouth. Takes 0.5  tablet in the morning and afternoon and  1 tablet at bedtime.   Yes [provider]  doxepin (SINEQUAN) 100 MG capsule Take 100 mg by mouth at bedtime.   Yes [provider]  HYDROcodone-acetaminophen (NORCO) 10-325 MG per tablet Take 1 tablet every 4 (four) hours by mouth.    Yes [provider]  ibuprofen (ADVIL,MOTRIN) 800 MG tablet Take 800 mg 3 (three) times daily by mouth.  07/07/16  Yes [provider]  levothyroxine (SYNTHROID, LEVOTHROID) 75 MCG tablet Take 1 tablet (75 mcg total) by mouth daily before breakfast. 09/05/14  Yes Benjiman CorePickering, Nathan, MD  lidocaine (XYLOCAINE) 5 % ointment Apply 1 application topically 4 (four) times daily as needed for mild pain.   Yes [provider]  lisinopril (PRINIVIL,ZESTRIL) 2.5 MG tablet Take 2.5 mg at bedtime by mouth.    Yes [provider]  metFORMIN (GLUCOPHAGE) 500 MG tablet Take 1 tablet (500 mg total) by mouth 2 (two)  times daily with a meal. 09/05/14  Yes [name], MD  mometasone (NASONEX) 50 MCG/ACT nasal spray Place 2 sprays into the nose daily as needed (for rhinitis).   Yes [provider]  naphazoline-glycerin (CLEAR EYES) 0.012-0.2 % SOLN Place 1-2 drops every 4 (four) hours as needed into both eyes for eye irritation.    Yes [provider]  omega-3 acid ethyl esters (LOVAZA) 1 g capsule Take 2 g 2 (two) times daily by mouth.    Yes [provider]  omeprazole (PRILOSEC) 40 MG capsule Take 1 capsule (40 mg total) by mouth daily. Patient taking differently: Take 40 mg by mouth daily as needed (for acid reflux).  09/05/14  Yes [name], MD  polyethylene glycol  [facility] / GLYCOLAX) packet Take 17 g by mouth daily as needed for mild constipation.   Yes [provider]  polyethylene glycol-electrolytes (TRILYTE) 420 g solution Take 4,000 mLs by mouth as directed. 10/27/16  Yes Nickalas Mccarrick, Gerrit Friends, MD  pregabalin (LYRICA) 300 MG capsule Take 1 capsule (300 mg total) by mouth 2 (two) times daily. 09/05/14  Yes [name], MD  rOPINIRole (REQUIP) 0.5 MG tablet Take 1 tablet (0.5 mg total) by mouth 3 (three) times daily. 09/05/14  Yes [name], MD  valACYclovir (VALTREX) 500 MG tablet Take 500 mg by mouth 2 (two) times daily.   Yes [name], MD    Allergies as of 10/27/2016 - Review Complete 10/27/2016  Allergen Reaction Noted  . Duragesic disc transdermal system [fentanyl] Nausea And Vomiting 03/19/2011    Family History  Problem Relation Age of Onset  . Colon cancer Neg Hx   . Celiac disease Neg Hx     Social History   Socioeconomic History  . Marital status: Divorced    Spouse name: Not on file  . Number of children: Not on file  . Years of education: Not on file  . Highest education level: Not on file  Social Needs  . Financial resource strain: Not on file  . Food insecurity - worry: Not on file  . Food insecurity - inability: Not on file  . Transportation needs - medical: Not on file  . Transportation needs - non-medical: Not on file  Occupational History  . Not on file  Tobacco Use  . Smoking status: Current Every Day Smoker    Packs/day: 0.50    Years: 20.00    Pack years: 10.00    Types: Cigarettes, E-cigarettes  . Smokeless tobacco: Never Used  Substance and Sexual Activity  . Alcohol use: No  . Drug use: Yes    Types: Hydrocodone, Marijuana    Comment: occ  . Sexual activity: No  Other Topics Concern  . Not on file  Social History Narrative  . Not on file    Review of Systems: See HPI, otherwise negative ROS  Physical Exam: Pulse 84   Temp 98.8 F (37.1 C) (Oral)   Resp 15   SpO2 95%   General:   Chronically ill-appearing pleasant and cooperative in NAD Neck:  Supple; no masses or thyromegaly. No significant cervical adenopathy. Lungs:  Clear throughout to auscultation.   No wheezes, crackles, or rhonchi. No acute distress. Heart:  Regular rate and rhythm; no murmurs, clicks, rubs,  or gallops. Abdomen: Non-distended, normal bowel sounds.  Soft and nontender without appreciable mass or hepatosplenomegaly.  Pulses:  Normal pulses noted. Extremities:  Without clubbing or edema.  Impression: 56 year old lady with dysphagia and GERD iron deficiency  anemia. Further evaluation warranted. Has chronic lung disease and tenuous respiratory status today. Discussed with anesthesia.  Recommendations: I will offer the patient EGD with EGD and colonoscopy per plan. Patient will receive a breathing treatment prior to the procedure will be watched closely by anesthesia. The risks, benefits, limitations, imponderables and alternatives regarding both EGD and colonoscopy have been reviewed with the patient. Questions have been answered. All parties agreeable.    Notice: This dictation was prepared with Dragon dictation along with smaller phrase technology. Any transcriptional errors that result from this process are unintentional and may not be corrected upon review.

## 2016-12-06 NOTE — Discharge Instructions (Signed)
EGD Discharge instructions Please read the instructions outlined below and refer to this sheet in the next few weeks. These discharge instructions provide you with general information on caring for yourself after you leave the hospital. Your doctor may also give you specific instructions. While your treatment has been planned according to the most current medical practices available, unavoidable complications occasionally occur. If you have any problems or questions after discharge, please call your doctor. ACTIVITY  You may resume your regular activity but move at a slower pace for the next 24 hours.   Take frequent rest periods for the next 24 hours.   Walking will help expel (get rid of) the air and reduce the bloated feeling in your abdomen.   No driving for 24 hours (because of the anesthesia (medicine) used during the test).   You may shower.   Do not sign any important legal documents or operate any machinery for 24 hours (because of the anesthesia used during the test).  NUTRITION  Drink plenty of fluids.   You may resume your normal diet.   Begin with a light meal and progress to your normal diet.   Avoid alcoholic beverages for 24 hours or as instructed by your caregiver.  MEDICATIONS  You may resume your normal medications unless your caregiver tells you otherwise.  WHAT YOU CAN EXPECT TODAY  You may experience abdominal discomfort such as a feeling of fullness or gas pains.  FOLLOW-UP  Your doctor will discuss the results of your test with you.  SEEK IMMEDIATE MEDICAL ATTENTION IF ANY OF THE FOLLOWING OCCUR:  Excessive nausea (feeling sick to your stomach) and/or vomiting.   Severe abdominal pain and distention (swelling).   Trouble swallowing.   Temperature over 101 F (37.8 C).   Rectal bleeding or vomiting of blood.    Colonoscopy Discharge Instructions  Read the instructions outlined below and refer to this sheet in the next few weeks. These  discharge instructions provide you with general information on caring for yourself after you leave the hospital. Your doctor may also give you specific instructions. While your treatment has been planned according to the most current medical practices available, unavoidable complications occasionally occur. If you have any problems or questions after discharge, call Dr. Gala Romney at [phone]. ACTIVITY  You may resume your regular activity, but move at a slower pace for the next 24 hours.   Take frequent rest periods for the next 24 hours.   Walking will help get rid of the air and reduce the bloated feeling in your belly (abdomen).   No driving for 24 hours (because of the medicine (anesthesia) used during the test).    Do not sign any important legal documents or operate any machinery for 24 hours (because of the anesthesia used during the test).  NUTRITION  Drink plenty of fluids.   You may resume your normal diet as instructed by your doctor.   Begin with a light meal and progress to your normal diet. Heavy or fried foods are harder to digest and may make you feel sick to your stomach (nauseated).   Avoid alcoholic beverages for 24 hours or as instructed.  MEDICATIONS  You may resume your normal medications unless your doctor tells you otherwise.  WHAT YOU CAN EXPECT TODAY  Some feelings of bloating in the abdomen.   Passage of more gas than usual.   Spotting of blood in your stool or on the toilet paper.  IF YOU HAD POLYPS REMOVED DURING THE  COLONOSCOPY:  No aspirin products for 7 days or as instructed.   No alcohol for 7 days or as instructed.   Eat a soft diet for the next 24 hours.  FINDING OUT THE RESULTS OF YOUR TEST Not all test results are available during your visit. If your test results are not back during the visit, make an appointment with your caregiver to find out the results. Do not assume everything is normal if you have not heard from your caregiver or the  medical facility. It is important for you to follow up on all of your test results.  SEEK IMMEDIATE MEDICAL ATTENTION IF:  You have more than a spotting of blood in your stool.   Your belly is swollen (abdominal distention).   You are nauseated or vomiting.   You have a temperature over 101.   You have abdominal pain or discomfort that is severe or gets worse throughout the day.    Your colon was not adequately prepped to complete a colonoscopy today  The stomach was abnormal and was biopsied. Your esophagus was dilated.  Further recommendations to follow pending review of pathology report  Office visit with us in about 2 months     PATIENT INSTRUCTIONS POST-ANESTHESIA  IMMEDIATELY FOLLOWING SURGERY:  Do not drive or operate machinery for the first twenty four hours after surgery.  Do not make any important decisions for twenty four hours after surgery or while taking narcotic pain medications or sedatives.  If you develop intractable nausea and vomiting or a severe headache please notify your doctor immediately.  FOLLOW-UP:  Please make an appointment with your surgeon as instructed. You do not need to follow up with anesthesia unless specifically instructed to do so.  WOUND CARE INSTRUCTIONS (if applicable):  Keep a dry clean dressing on the anesthesia/puncture wound site if there is drainage.  Once the wound has quit draining you may leave it open to air.  Generally you should leave the bandage intact for twenty four hours unless there is drainage.  If the epidural site drains for more than 36-48 hours please call the anesthesia department.  QUESTIONS?:  Please feel free to call your physician or the hospital operator if you have any questions, and they will be happy to assist you.

## 2016-12-06 NOTE — Anesthesia Preprocedure Evaluation (Signed)
Anesthesia Evaluation  Patient identified by MRN, date of birth, ID band Patient awake  General Assessment Comment:Resting room air sat 84%  Airway Mallampati: I       Dental  (+) Edentulous Upper, Edentulous Lower   Pulmonary sleep apnea , pneumonia, resolved, COPD (severe),  COPD inhaler and oxygen dependent, Current Smoker,  Results for Patricia Bass, Patricia Bass (MRN [id number]) as of 12/06/2016 08:00  07/07/2016 03:50 Delivery systems: NASAL CANNULA FIO2: 28.00 pH, Arterial: 7.312 (L) pCO2 arterial: 57.0 (H) pO2, Arterial: 66.0 (L) Acid-Base Excess: 2.3 (H) Bicarbonate: 25.8 O2 Saturation: 89.6 Patient temperature: 37.0   (July 2018)     + decreased breath sounds (diminished throughout)      Cardiovascular Exercise Tolerance: Poor + CAD  Normal cardiovascular exam Rhythm:Regular Rate:Normal     Neuro/Psych  Headaches, Anxiety    GI/Hepatic   Endo/Other  diabetes  Renal/GU      Musculoskeletal   Abdominal   Peds  Hematology   Anesthesia Other Findings   Reproductive/Obstetrics                             Anesthesia Physical Anesthesia Plan  ASA: IV  Anesthesia Plan: MAC   Post-op Pain Management:    Induction: Intravenous  PONV Risk Score and Plan:   Airway Management Planned: Nasal Cannula  Additional Equipment:   Intra-op Plan:   Post-operative Plan:   Informed Consent: I have reviewed the patients History and Physical, chart, labs and discussed the procedure including the risks, benefits and alternatives for the proposed anesthesia with the patient or authorized representative who has indicated his/her understanding and acceptance.     Plan Discussed with: CRNA  Anesthesia Plan Comments:         Anesthesia Quick Evaluation

## 2016-12-06 NOTE — Anesthesia Postprocedure Evaluation (Signed)
Anesthesia Post Note  Patient: Patricia Bass  Procedure(s) Performed: COLONOSCOPY WITH PROPOFOL (N/A ) ESOPHAGOGASTRODUODENOSCOPY (EGD) WITH PROPOFOL (N/A ) MALONEY DILATION (N/A ) BIOPSY  Patient location during evaluation: Short Stay Anesthesia Type: MAC Level of consciousness: awake and alert and oriented Pain management: pain level controlled Vital Signs Assessment: post-procedure vital signs reviewed and stable Respiratory status: spontaneous breathing Cardiovascular status: blood pressure returned to baseline Postop Assessment: no apparent nausea or vomiting Anesthetic complications: no     Last Vitals:  Vitals:   12/06/16 1000 12/06/16 1016  BP:  109/73  Pulse: 79 81  Resp: 14   Temp:  36.7 C  SpO2: 93% 92%    Last Pain:  Vitals:   12/06/16 1016  TempSrc: Oral  PainSc:                  Tressie Stalker

## 2016-12-08 ENCOUNTER — Encounter (HOSPITAL_COMMUNITY): Payer: Self-pay | Admitting: Internal Medicine

## 2016-12-08 ENCOUNTER — Encounter: Payer: Self-pay | Admitting: Internal Medicine

## 2016-12-22 DIAGNOSIS — J441 Chronic obstructive pulmonary disease with (acute) exacerbation: Secondary | ICD-10-CM | POA: Diagnosis not present

## 2017-01-12 DIAGNOSIS — J449 Chronic obstructive pulmonary disease, unspecified: Secondary | ICD-10-CM | POA: Diagnosis not present

## 2017-01-12 DIAGNOSIS — I10 Essential (primary) hypertension: Secondary | ICD-10-CM | POA: Diagnosis not present

## 2017-01-12 DIAGNOSIS — M545 Low back pain: Secondary | ICD-10-CM | POA: Diagnosis not present

## 2017-01-12 DIAGNOSIS — G4733 Obstructive sleep apnea (adult) (pediatric): Secondary | ICD-10-CM | POA: Diagnosis not present

## 2017-01-16 ENCOUNTER — Emergency Department (HOSPITAL_COMMUNITY)
Admission: EM | Admit: 2017-01-16 | Discharge: 2017-01-16 | Disposition: A | Discharge status: Home / Self Care | Payer: Medicare Other | Attending: Emergency Medicine | Admitting: Emergency Medicine

## 2017-01-16 ENCOUNTER — Emergency Department (HOSPITAL_COMMUNITY): Payer: Medicare Other

## 2017-01-16 ENCOUNTER — Other Ambulatory Visit: Payer: Self-pay

## 2017-01-16 ENCOUNTER — Encounter (HOSPITAL_COMMUNITY): Payer: Self-pay

## 2017-01-16 DIAGNOSIS — Z79899 Other long term (current) drug therapy: Secondary | ICD-10-CM | POA: Insufficient documentation

## 2017-01-16 DIAGNOSIS — R1032 Left lower quadrant pain: Secondary | ICD-10-CM | POA: Diagnosis not present

## 2017-01-16 DIAGNOSIS — K5900 Constipation, unspecified: Secondary | ICD-10-CM | POA: Insufficient documentation

## 2017-01-16 DIAGNOSIS — E039 Hypothyroidism, unspecified: Secondary | ICD-10-CM | POA: Insufficient documentation

## 2017-01-16 DIAGNOSIS — K429 Umbilical hernia without obstruction or gangrene: Secondary | ICD-10-CM | POA: Diagnosis not present

## 2017-01-16 DIAGNOSIS — Z7984 Long term (current) use of oral hypoglycemic drugs: Secondary | ICD-10-CM | POA: Insufficient documentation

## 2017-01-16 DIAGNOSIS — E119 Type 2 diabetes mellitus without complications: Secondary | ICD-10-CM | POA: Insufficient documentation

## 2017-01-16 DIAGNOSIS — I259 Chronic ischemic heart disease, unspecified: Secondary | ICD-10-CM | POA: Diagnosis not present

## 2017-01-16 DIAGNOSIS — Z7982 Long term (current) use of aspirin: Secondary | ICD-10-CM | POA: Insufficient documentation

## 2017-01-16 DIAGNOSIS — F17210 Nicotine dependence, cigarettes, uncomplicated: Secondary | ICD-10-CM | POA: Diagnosis not present

## 2017-01-16 DIAGNOSIS — J449 Chronic obstructive pulmonary disease, unspecified: Secondary | ICD-10-CM | POA: Diagnosis not present

## 2017-01-16 LAB — COMPREHENSIVE METABOLIC PANEL
ALT: 22 U/L (ref 14–54)
AST: 29 U/L (ref 15–41)
Albumin: 3.8 g/dL (ref 3.5–5.0)
Alkaline Phosphatase: 87 U/L (ref 38–126)
Anion gap: 12 (ref 5–15)
BUN: 9 mg/dL (ref 6–20)
CALCIUM: 9.3 mg/dL (ref 8.9–10.3)
CHLORIDE: 101 mmol/L (ref 101–111)
CO2: 27 mmol/L (ref 22–32)
CREATININE: 0.68 mg/dL (ref 0.44–1.00)
Glucose, Bld: 91 mg/dL (ref 65–99)
Potassium: 3.7 mmol/L (ref 3.5–5.1)
Sodium: 140 mmol/L (ref 135–145)
Total Bilirubin: 0.6 mg/dL (ref 0.3–1.2)
Total Protein: 7.8 g/dL (ref 6.5–8.1)

## 2017-01-16 LAB — URINALYSIS, ROUTINE W REFLEX MICROSCOPIC
BILIRUBIN URINE: NEGATIVE
GLUCOSE, UA: NEGATIVE mg/dL
HGB URINE DIPSTICK: NEGATIVE
KETONES UR: NEGATIVE mg/dL
LEUKOCYTES UA: NEGATIVE
Nitrite: POSITIVE — AB
PROTEIN: NEGATIVE mg/dL
Specific Gravity, Urine: 1.004 — ABNORMAL LOW (ref 1.005–1.030)
pH: 5 (ref 5.0–8.0)

## 2017-01-16 LAB — CBC
HCT: 39.2 % (ref 36.0–46.0)
Hemoglobin: 12.2 g/dL (ref 12.0–15.0)
MCH: 30 pg (ref 26.0–34.0)
MCHC: 31.1 g/dL (ref 30.0–36.0)
MCV: 96.3 fL (ref 78.0–100.0)
PLATELETS: 183 10*3/uL (ref 150–400)
RBC: 4.07 MIL/uL (ref 3.87–5.11)
RDW: 14.5 % (ref 11.5–15.5)
WBC: 10.8 10*3/uL — AB (ref 4.0–10.5)

## 2017-01-16 LAB — LIPASE, BLOOD: LIPASE: 24 U/L (ref 11–51)

## 2017-01-16 MED ORDER — IOPAMIDOL (ISOVUE-300) INJECTION 61%
100.0000 mL | Freq: Once | INTRAVENOUS | Status: AC | PRN
  Administered 2017-01-16: 100 mL via INTRAVENOUS

## 2017-01-16 MED ORDER — SODIUM CHLORIDE 0.9 % IV BOLUS (SEPSIS)
1000.0000 mL | Freq: Once | INTRAVENOUS | Status: AC
  Administered 2017-01-16: 1000 mL via INTRAVENOUS

## 2017-01-16 NOTE — ED Provider Notes (Signed)
[facility] EMERGENCY DEPARTMENT Provider Note   CSN: [id number] Arrival date & time: 01/16/17  1130     History   Chief Complaint Chief Complaint  Patient presents with  . Abdominal Pain    HPI Patricia Bass is a 57 y.o. female.  Patient complains of some left lower quadrant abdominal pain no nausea no vomiting no fever   The history is provided by the patient. No language interpreter was used.  Abdominal Pain   This is a new problem. The current episode started 3 to 5 hours ago. The problem occurs constantly. The problem has not changed since onset.The pain is associated with an unknown factor. The pain is located in the LLQ. The quality of the pain is aching. The pain is at a severity of 5/10. Pertinent negatives include diarrhea, frequency, hematuria and headaches.    Past Medical History:  Diagnosis Date  . Anemia   . Anxiety   . Arthritis   . Bulging disc   . Carpal tunnel syndrome   . Cervicalgia   . Chronic airway obstruction (HCC)   . Chronic back pain   . COPD (chronic obstructive pulmonary disease) (HCC)   . Coronary artery disease   . Diabetes mellitus (HCC) 01/11/2012  . GERD (gastroesophageal reflux disease)   . Headache(784.0)   . Hypothyroid   . Lumbago   . Migraine   . Myalgia and myositis, unspecified   . Neuropathy   . Pneumonia   . Restless leg syndrome   . Shortness of breath   . Sleep apnea    uses O2 at home. uses it at home all times and at night at 2 liters.    Patient Active Problem List   Diagnosis Date Noted  . Abdominal pain, epigastric 10/27/2016  . IDA (iron deficiency anemia) 10/27/2016  . GERD (gastroesophageal reflux disease) 10/27/2016  . Esophageal dysphagia 10/27/2016  . Acute respiratory failure with hypoxia (HCC) 07/08/2016  . Acute respiratory failure with hypercapnia (HCC) 07/08/2016  . Cellulitis 07/07/2016  . Cellulitis of great toe 07/07/2016  . CAP (community acquired pneumonia) 06/20/2016  . Respiratory  failure, acute and chronic (HCC) 06/20/2016  . Hypokalemia 06/20/2016  . Carpal tunnel syndrome   . Migraine   . Hypothyroid   . Myalgia and myositis, unspecified   . Lumbago   . Chronic airway obstruction (HCC)   . Cervicalgia   . Anxiety   . Diabetes mellitus (HCC) 01/11/2012  . S/P cubital tunnel release 04/14/2011  . Ulnar nerve compression 04/14/2011  . Pain in joint, forearm 04/14/2011    Past Surgical History:  Procedure Laterality Date  . BIOPSY  12/06/2016   Procedure: BIOPSY;  Surgeon: Corbin Ade, MD;  Location: [facility];  Service: Endoscopy;;  gastric   . BLADDER SUSPENSION    . CARDIAC CATHETERIZATION    . CHOLECYSTECTOMY    . COLONOSCOPY WITH PROPOFOL N/A 12/06/2016   Procedure: COLONOSCOPY WITH PROPOFOL  Formed stool could not do;  Surgeon: Corbin Ade, MD;  Location: [facility];  Service: Endoscopy;  Laterality: N/A;  10:00am  . ESOPHAGOGASTRODUODENOSCOPY (EGD) WITH PROPOFOL N/A 12/06/2016   Procedure: ESOPHAGOGASTRODUODENOSCOPY (EGD) WITH PROPOFOL;  Surgeon: Corbin Ade, MD;  Location: [facility];  Service: Endoscopy;  Laterality: N/A;  . Elease Hashimoto DILATION N/A 12/06/2016   Procedure: Elease Hashimoto DILATION;  Surgeon: Corbin Ade, MD;  Location: [facility];  Service: Endoscopy;  Laterality: N/A;  . TUBAL LIGATION    .  ULNAR NERVE TRANSPOSITION  04/01/2011   Procedure: ULNAR NERVE DECOMPRESSION/TRANSPOSITION;  Surgeon: Dominica SeverinWilliam Gramig, MD;  [facility];  Service: Orthopedics;  Laterality: Left;  ULNAR NERVE RELEASE WITH ANTERIOR TRANS POSITION REPAIR RECONSTRUCTION AS NECESSARY    OB History    No data available       Home Medications    Prior to Admission medications   Medication Sig Start Date End Date Taking? Authorizing Provider  albuterol (PROVENTIL HFA;VENTOLIN HFA) 108 (90 BASE) MCG/ACT inhaler Inhale 2 puffs into the lungs every 6 (six) hours as needed for wheezing or shortness of breath.     [provider]  aspirin  81 MG chewable tablet Chew 81 mg 2 (two) times a week by mouth.     [provider]  atorvastatin (LIPITOR) 40 MG tablet Take 40 mg by mouth at bedtime.    [provider]  BIOTIN PO Take 1 tablet 3 (three) times a week by mouth.    [provider]  cetirizine (ZYRTEC) 10 MG tablet Take 10 mg daily by mouth.     Kari BaarsHawkins, Edward, MD  CHANTIX 1 MG tablet Take 1 tablet by mouth 2 (two) times daily. 10/19/16   [provider]  clonazePAM (KLONOPIN) 1 MG tablet Take 0.5-2 mg 3 (three) times daily by mouth. Takes 0.5  tablet in the morning and afternoon and  1 tablet at bedtime.    [provider]  doxepin (SINEQUAN) 100 MG capsule Take 100 mg by mouth at bedtime.    [provider]  HYDROcodone-acetaminophen (NORCO) 10-325 MG per tablet Take 1 tablet every 4 (four) hours by mouth.     [provider]  ibuprofen (ADVIL,MOTRIN) 800 MG tablet Take 800 mg 3 (three) times daily by mouth.  07/07/16   [provider]  levothyroxine (SYNTHROID, LEVOTHROID) 75 MCG tablet Take 1 tablet (75 mcg total) by mouth daily before breakfast. 09/05/14   Benjiman CorePickering, Nathan, MD  lidocaine (XYLOCAINE) 5 % ointment Apply 1 application topically 4 (four) times daily as needed for mild pain.    [provider]  lisinopril (PRINIVIL,ZESTRIL) 2.5 MG tablet Take 2.5 mg at bedtime by mouth.     [provider]  metFORMIN (GLUCOPHAGE) 500 MG tablet Take 1 tablet (500 mg total) by mouth 2 (two) times daily with a meal. 09/05/14   Benjiman CorePickering, Nathan, MD  mometasone (NASONEX) 50 MCG/ACT nasal spray Place 2 sprays into the nose daily as needed (for rhinitis).    [provider]  naphazoline-glycerin (CLEAR EYES) 0.012-0.2 % SOLN Place 1-2 drops every 4 (four) hours as needed into both eyes for eye irritation.     [provider]  omega-3 acid ethyl esters (LOVAZA) 1 g capsule Take 2 g 2 (two) times daily by mouth.     [provider]   omeprazole (PRILOSEC) 40 MG capsule Take 1 capsule (40 mg total) by mouth daily. Patient taking differently: Take 40 mg by mouth daily as needed (for acid reflux).  09/05/14   Benjiman CorePickering, Nathan, MD  polyethylene glycol [facility](MIRALAX / Ethelene HalGLYCOLAX) packet Take 17 g by mouth daily as needed for mild constipation.    [provider]  polyethylene glycol-electrolytes (TRILYTE) 420 g solution Take 4,000 mLs by mouth as directed. 10/27/16   Rourk, Gerrit Friendsobert M, MD  pregabalin (LYRICA) 300 MG capsule Take 1 capsule (300 mg total) by mouth 2 (two) times daily. 09/05/14   Benjiman CorePickering, Nathan, MD  rOPINIRole (REQUIP) 0.5 MG tablet Take 1 tablet (  0.5 mg total) by mouth 3 (three) times daily. 09/05/14   [name], MD  valACYclovir (VALTREX) 500 MG tablet Take 500 mg by mouth 2 (two) times daily.    [name], MD    Family History Family History  Problem Relation Age of Onset  . Colon cancer Neg Hx   . Celiac disease Neg Hx     Social History Social History   Tobacco Use  . Smoking status: Current Every Day Smoker    Packs/day: 0.50    Years: 20.00    Pack years: 10.00    Types: Cigarettes, E-cigarettes  . Smokeless tobacco: Never Used  Substance Use Topics  . Alcohol use: No  . Drug use: Yes    Types: Hydrocodone, Marijuana    Comment: occ     Allergies   Duragesic disc transdermal system [fentanyl]   Review of Systems Review of Systems  Constitutional: Negative for appetite change and fatigue.  HENT: Negative for congestion, ear discharge and sinus pressure.   Eyes: Negative for discharge.  Respiratory: Negative for cough.   Cardiovascular: Negative for chest pain.  Gastrointestinal: Positive for abdominal pain. Negative for diarrhea.  Genitourinary: Negative for frequency and hematuria.  Musculoskeletal: Negative for back pain.  Skin: Negative for rash.  Neurological: Negative for seizures and headaches.  Psychiatric/Behavioral: Negative for hallucinations.      Physical Exam Updated Vital Signs BP 92/67   Pulse 82   Temp 98.3 F (36.8 C) (Oral)   Resp (!) 24   Ht 5' (1.524 m)   Wt 72.6 kg (160 lb)   SpO2 98%   BMI 31.25 kg/m   Physical Exam  Constitutional: She is oriented to person, place, and time. She appears well-developed.  HENT:  Head: Normocephalic.  Eyes: Conjunctivae and EOM are normal. No scleral icterus.  Neck: Neck supple. No thyromegaly present.  Cardiovascular: Normal rate and regular rhythm. Exam reveals no gallop and no friction rub.  No murmur heard. Pulmonary/Chest: No stridor. She has no wheezes. She has no rales. She exhibits no tenderness.  Abdominal: She exhibits no distension. There is no tenderness. There is no rebound.  Minimal left lower quadrant tenderness  Musculoskeletal: Normal range of motion. She exhibits no edema.  Lymphadenopathy:    She has no cervical adenopathy.  Neurological: She is oriented to person, place, and time. She exhibits normal muscle tone. Coordination normal.  Skin: No rash noted. No erythema.  Psychiatric: She has a normal mood and affect. Her behavior is normal.     ED Treatments / Results  Labs (all labs ordered are listed, but only abnormal results are displayed) Labs Reviewed  CBC - Abnormal; Notable for the following components:      Result Value   WBC 10.8 (*)    All other components within normal limits  URINALYSIS, ROUTINE W REFLEX MICROSCOPIC - Abnormal; Notable for the following components:   Color, Urine STRAW (*)    Specific Gravity, Urine 1.004 (*)    Nitrite POSITIVE (*)    Bacteria, UA RARE (*)    Squamous Epithelial / LPF 0-5 (*)    All other components within normal limits  LIPASE, BLOOD  COMPREHENSIVE METABOLIC PANEL    EKG  EKG Interpretation None       Radiology Ct Abdomen Pelvis W Contrast  Result Date: 01/16/2017 CLINICAL DATA:  Umbilical and periumbilical pain since yesterday. Umbilical tenderness. EXAM: CT ABDOMEN AND PELVIS WITH  CONTRAST TECHNIQUE: Multidetector CT imaging of the abdomen and  pelvis was performed using the standard protocol following bolus administration of intravenous contrast. CONTRAST:  ISOVUE-300 IOPAMIDOL (ISOVUE-300) INJECTION 61% COMPARISON:  Lumbar spine radiographs dated 02/27/2016 and bilateral hip radiographs dated 12/14/2010. FINDINGS: Lower chest: Mild bibasilar atelectasis. Hepatobiliary: No focal liver abnormality is seen. Status post cholecystectomy. No biliary dilatation. Pancreas: Diffuse pancreatic atrophy. Spleen: Normal in size without focal abnormality. Adrenals/Urinary Tract: Tiny posterior right renal upper pole cortical calcification. Otherwise, normal appearing kidneys, ureters, urinary bladder and adrenal glands. Stomach/Bowel: Prominent stool in the colon. Unremarkable stomach and small bowel. Normal appearing appendix. Vascular/Lymphatic: Atheromatous arterial calcifications without aneurysm. No enlarged lymph nodes. Reproductive: Uterus and bilateral adnexa are unremarkable. Other: Tiny umbilical hernia containing fat with a small amount of central soft tissue density. Musculoskeletal: Lumbar and lower thoracic spine degenerative changes. Mild dextroconvex thoracolumbar scoliosis. IMPRESSION: 1. No acute abnormality. 2. Prominent stool in the colon. 3. Tiny umbilical hernia containing fat with a small amount of nonspecific central soft tissue density. Electronically Signed   By: Beckie Salts M.D.   On: 01/16/2017 14:40    Procedures Procedures (including critical care time)  Medications Ordered in ED Medications  sodium chloride 0.9 % bolus 1,000 mL (1,000 mLs Intravenous New Bag/Given 01/16/17 1426)  iopamidol (ISOVUE-300) 61 % injection 100 mL (100 mLs Intravenous Contrast Given 01/16/17 1400)     Initial Impression / Assessment and Plan / ED Course  I have reviewed the triage vital signs and the nursing notes.  Pertinent labs & imaging results that were available during my  care of the patient were reviewed by me and considered in my medical decision making (see chart for details).     Patient improved with some IV fluids and time.  CT scan shows some constipation.  I suspect patient has had some constipation pain and will follow-up with her doctor if any problems.  At time of discharge she no longer had discomfort  Final Clinical Impressions(s) / ED Diagnoses   Final diagnoses:  Constipation, unspecified constipation type    ED Discharge Orders    None       Bethann Berkshire, MD 01/16/17 1456

## 2017-01-16 NOTE — Discharge Instructions (Signed)
Drink plenty of fluids.  Follow up with your md this week if not improving. °

## 2017-01-16 NOTE — ED Triage Notes (Signed)
Pt has pain in her abdoment that starts at her navel and extends all around her umbilical region. Pain started yesterday. States it feels as though it's a sharp pain. Normal BM. Denies Nausea or vomiting. Abdomen is is tender in umbilicus area.

## 2017-01-16 NOTE — ED Notes (Signed)
Pt is on chronic O2

## 2017-01-20 ENCOUNTER — Other Ambulatory Visit (HOSPITAL_COMMUNITY): Payer: Self-pay | Admitting: Pulmonary Disease

## 2017-01-20 DIAGNOSIS — M545 Low back pain: Secondary | ICD-10-CM

## 2017-01-22 DIAGNOSIS — J441 Chronic obstructive pulmonary disease with (acute) exacerbation: Secondary | ICD-10-CM | POA: Diagnosis not present

## 2017-02-07 ENCOUNTER — Encounter: Payer: Self-pay | Admitting: Gastroenterology

## 2017-02-07 ENCOUNTER — Ambulatory Visit: Payer: Medicare Other | Admitting: Gastroenterology

## 2017-02-07 ENCOUNTER — Telehealth: Payer: Self-pay | Admitting: Gastroenterology

## 2017-02-07 NOTE — Telephone Encounter (Signed)
PATIENT WAS A NO SHOW AND LETTER SENT  °

## 2017-02-22 DIAGNOSIS — J441 Chronic obstructive pulmonary disease with (acute) exacerbation: Secondary | ICD-10-CM | POA: Diagnosis not present

## 2017-03-22 DIAGNOSIS — J441 Chronic obstructive pulmonary disease with (acute) exacerbation: Secondary | ICD-10-CM | POA: Diagnosis not present

## 2017-04-22 DIAGNOSIS — J441 Chronic obstructive pulmonary disease with (acute) exacerbation: Secondary | ICD-10-CM | POA: Diagnosis not present

## 2017-05-03 DIAGNOSIS — Z79891 Long term (current) use of opiate analgesic: Secondary | ICD-10-CM | POA: Diagnosis not present

## 2017-05-12 DIAGNOSIS — J449 Chronic obstructive pulmonary disease, unspecified: Secondary | ICD-10-CM | POA: Diagnosis not present

## 2017-05-12 DIAGNOSIS — I10 Essential (primary) hypertension: Secondary | ICD-10-CM | POA: Diagnosis not present

## 2017-05-12 DIAGNOSIS — J301 Allergic rhinitis due to pollen: Secondary | ICD-10-CM | POA: Diagnosis not present

## 2017-05-12 DIAGNOSIS — M545 Low back pain: Secondary | ICD-10-CM | POA: Diagnosis not present

## 2017-05-22 DIAGNOSIS — J441 Chronic obstructive pulmonary disease with (acute) exacerbation: Secondary | ICD-10-CM | POA: Diagnosis not present

## 2017-06-22 DIAGNOSIS — J441 Chronic obstructive pulmonary disease with (acute) exacerbation: Secondary | ICD-10-CM | POA: Diagnosis not present

## 2017-07-22 DIAGNOSIS — J441 Chronic obstructive pulmonary disease with (acute) exacerbation: Secondary | ICD-10-CM | POA: Diagnosis not present

## 2017-08-22 DIAGNOSIS — J441 Chronic obstructive pulmonary disease with (acute) exacerbation: Secondary | ICD-10-CM | POA: Diagnosis not present

## 2017-09-22 DIAGNOSIS — J441 Chronic obstructive pulmonary disease with (acute) exacerbation: Secondary | ICD-10-CM | POA: Diagnosis not present

## 2017-10-18 DIAGNOSIS — Z79891 Long term (current) use of opiate analgesic: Secondary | ICD-10-CM | POA: Diagnosis not present

## 2017-10-22 DIAGNOSIS — J441 Chronic obstructive pulmonary disease with (acute) exacerbation: Secondary | ICD-10-CM | POA: Diagnosis not present

## 2017-10-25 ENCOUNTER — Other Ambulatory Visit: Payer: Self-pay

## 2017-10-25 ENCOUNTER — Emergency Department (HOSPITAL_COMMUNITY): Payer: Medicare Other

## 2017-10-25 ENCOUNTER — Inpatient Hospital Stay (HOSPITAL_COMMUNITY)
Admission: EM | Admit: 2017-10-25 | Discharge: 2017-10-28 | DRG: 871 | Disposition: A | Discharge status: Home / Self Care | Payer: Medicare Other | Attending: Family Medicine | Admitting: Family Medicine

## 2017-10-25 ENCOUNTER — Encounter (HOSPITAL_COMMUNITY): Payer: Self-pay | Admitting: Emergency Medicine

## 2017-10-25 DIAGNOSIS — G4733 Obstructive sleep apnea (adult) (pediatric): Secondary | ICD-10-CM | POA: Diagnosis not present

## 2017-10-25 DIAGNOSIS — F419 Anxiety disorder, unspecified: Secondary | ICD-10-CM | POA: Diagnosis present

## 2017-10-25 DIAGNOSIS — E876 Hypokalemia: Secondary | ICD-10-CM | POA: Diagnosis not present

## 2017-10-25 DIAGNOSIS — Z23 Encounter for immunization: Secondary | ICD-10-CM

## 2017-10-25 DIAGNOSIS — K219 Gastro-esophageal reflux disease without esophagitis: Secondary | ICD-10-CM | POA: Diagnosis not present

## 2017-10-25 DIAGNOSIS — R0689 Other abnormalities of breathing: Secondary | ICD-10-CM | POA: Diagnosis not present

## 2017-10-25 DIAGNOSIS — A419 Sepsis, unspecified organism: Secondary | ICD-10-CM | POA: Diagnosis not present

## 2017-10-25 DIAGNOSIS — M549 Dorsalgia, unspecified: Secondary | ICD-10-CM | POA: Diagnosis not present

## 2017-10-25 DIAGNOSIS — R0600 Dyspnea, unspecified: Secondary | ICD-10-CM | POA: Diagnosis not present

## 2017-10-25 DIAGNOSIS — E119 Type 2 diabetes mellitus without complications: Secondary | ICD-10-CM | POA: Diagnosis not present

## 2017-10-25 DIAGNOSIS — I2510 Atherosclerotic heart disease of native coronary artery without angina pectoris: Secondary | ICD-10-CM | POA: Diagnosis present

## 2017-10-25 DIAGNOSIS — Z79899 Other long term (current) drug therapy: Secondary | ICD-10-CM | POA: Diagnosis not present

## 2017-10-25 DIAGNOSIS — I10 Essential (primary) hypertension: Secondary | ICD-10-CM | POA: Diagnosis not present

## 2017-10-25 DIAGNOSIS — R000 Tachycardia, unspecified: Secondary | ICD-10-CM | POA: Diagnosis not present

## 2017-10-25 DIAGNOSIS — E039 Hypothyroidism, unspecified: Secondary | ICD-10-CM | POA: Diagnosis not present

## 2017-10-25 DIAGNOSIS — R6520 Severe sepsis without septic shock: Secondary | ICD-10-CM

## 2017-10-25 DIAGNOSIS — J9621 Acute and chronic respiratory failure with hypoxia: Secondary | ICD-10-CM | POA: Diagnosis present

## 2017-10-25 DIAGNOSIS — I4510 Unspecified right bundle-branch block: Secondary | ICD-10-CM | POA: Diagnosis not present

## 2017-10-25 DIAGNOSIS — E785 Hyperlipidemia, unspecified: Secondary | ICD-10-CM | POA: Diagnosis not present

## 2017-10-25 DIAGNOSIS — Z9981 Dependence on supplemental oxygen: Secondary | ICD-10-CM

## 2017-10-25 DIAGNOSIS — J440 Chronic obstructive pulmonary disease with acute lower respiratory infection: Secondary | ICD-10-CM | POA: Diagnosis present

## 2017-10-25 DIAGNOSIS — R05 Cough: Secondary | ICD-10-CM | POA: Diagnosis not present

## 2017-10-25 DIAGNOSIS — G2581 Restless legs syndrome: Secondary | ICD-10-CM | POA: Diagnosis not present

## 2017-10-25 DIAGNOSIS — Z7982 Long term (current) use of aspirin: Secondary | ICD-10-CM | POA: Diagnosis not present

## 2017-10-25 DIAGNOSIS — J189 Pneumonia, unspecified organism: Secondary | ICD-10-CM | POA: Diagnosis present

## 2017-10-25 DIAGNOSIS — R8271 Bacteriuria: Secondary | ICD-10-CM | POA: Diagnosis present

## 2017-10-25 DIAGNOSIS — Z7984 Long term (current) use of oral hypoglycemic drugs: Secondary | ICD-10-CM

## 2017-10-25 DIAGNOSIS — F17210 Nicotine dependence, cigarettes, uncomplicated: Secondary | ICD-10-CM | POA: Diagnosis present

## 2017-10-25 DIAGNOSIS — J9601 Acute respiratory failure with hypoxia: Secondary | ICD-10-CM | POA: Diagnosis not present

## 2017-10-25 DIAGNOSIS — G8929 Other chronic pain: Secondary | ICD-10-CM | POA: Diagnosis present

## 2017-10-25 DIAGNOSIS — Z885 Allergy status to narcotic agent status: Secondary | ICD-10-CM

## 2017-10-25 DIAGNOSIS — D72829 Elevated white blood cell count, unspecified: Secondary | ICD-10-CM | POA: Diagnosis not present

## 2017-10-25 LAB — COMPREHENSIVE METABOLIC PANEL
ALBUMIN: 3.9 g/dL (ref 3.5–5.0)
ALK PHOS: 94 U/L (ref 38–126)
ALT: 18 U/L (ref 0–44)
ANION GAP: 12 (ref 5–15)
AST: 20 U/L (ref 15–41)
BILIRUBIN TOTAL: 0.8 mg/dL (ref 0.3–1.2)
BUN: 16 mg/dL (ref 6–20)
CALCIUM: 8.7 mg/dL — AB (ref 8.9–10.3)
CO2: 28 mmol/L (ref 22–32)
Chloride: 97 mmol/L — ABNORMAL LOW (ref 98–111)
Creatinine, Ser: 0.8 mg/dL (ref 0.44–1.00)
GFR calc Af Amer: >60 mL/min (ref >60)
GLUCOSE: 114 mg/dL — AB (ref 70–99)
POTASSIUM: 3.2 mmol/L — AB (ref 3.5–5.1)
SODIUM: 137 mmol/L (ref 135–145)
TOTAL PROTEIN: 7.9 g/dL (ref 6.5–8.1)

## 2017-10-25 LAB — I-STAT CG4 LACTIC ACID, ED: LACTIC ACID, VENOUS: 3.37 mmol/L — AB (ref 0.5–1.9)

## 2017-10-25 LAB — CBC WITH DIFFERENTIAL/PLATELET
Abs Immature Granulocytes: 0.1 10*3/uL — ABNORMAL HIGH (ref 0.00–0.07)
BASOS ABS: 0 10*3/uL (ref 0.0–0.1)
BASOS PCT: 0 %
EOS ABS: 0 10*3/uL (ref 0.0–0.5)
EOS PCT: 0 %
HEMATOCRIT: 41.2 % (ref 36.0–46.0)
Hemoglobin: 12.2 g/dL (ref 12.0–15.0)
Immature Granulocytes: 1 %
LYMPHS ABS: 0.9 10*3/uL (ref 0.7–4.0)
Lymphocytes Relative: 4 %
MCH: 26.8 pg (ref 26.0–34.0)
MCHC: 29.6 g/dL — AB (ref 30.0–36.0)
MCV: 90.5 fL (ref 80.0–100.0)
MONOS PCT: 5 %
Monocytes Absolute: 0.9 10*3/uL (ref 0.1–1.0)
NRBC: 0 % (ref 0.0–0.2)
Neutro Abs: 17.6 10*3/uL — ABNORMAL HIGH (ref 1.7–7.7)
Neutrophils Relative %: 90 %
Platelets: 208 10*3/uL (ref 150–400)
RBC: 4.55 MIL/uL (ref 3.87–5.11)
RDW: 16.9 % — AB (ref 11.5–15.5)
WBC: 19.5 10*3/uL — ABNORMAL HIGH (ref 4.0–10.5)

## 2017-10-25 LAB — I-STAT BETA HCG BLOOD, ED (MC, WL, AP ONLY): I-stat hCG, quantitative: <5 m[IU]/mL (ref <5)

## 2017-10-25 LAB — PROTIME-INR
INR: 1.01
Prothrombin Time: 13.2 seconds (ref 11.4–15.2)

## 2017-10-25 MED ORDER — SODIUM CHLORIDE 0.9 % IV SOLN
500.0000 mg | INTRAVENOUS | Status: DC
  Administered 2017-10-25 – 2017-10-28 (×3): 500 mg via INTRAVENOUS
  Filled 2017-10-25 (×6): qty 500

## 2017-10-25 MED ORDER — LACTATED RINGERS IV BOLUS (SEPSIS)
1000.0000 mL | Freq: Once | INTRAVENOUS | Status: AC
  Administered 2017-10-25: 1000 mL via INTRAVENOUS

## 2017-10-25 MED ORDER — SODIUM CHLORIDE 0.9 % IV SOLN
2.0000 g | Freq: Once | INTRAVENOUS | Status: AC
  Administered 2017-10-25: 2 g via INTRAVENOUS
  Filled 2017-10-25: qty 2

## 2017-10-25 NOTE — ED Provider Notes (Addendum)
[facility] EMERGENCY DEPARTMENT Provider Note   CSN: [id number] Arrival date & time: 10/25/17  2259     History   Chief Complaint Chief Complaint  Patient presents with  . Chest Pain    HPI Patricia Bass is a 57 y.o. female.  HPI  57 year old female with history of CAD, diabetes, COPD on 2 L of home oxygen at all times, OSA comes in with chief complaint of chest pain and cough.  Patient's home aide went to check on her and noted that patient was hot and diaphoretic.  Patient's oxygen saturation was also low therefore she called the ambulance.  Patient states that she is been feeling sick for the last 3 days.  She has been taking ibuprofen around-the-clock, but today she had significant malaise and did not get out of her bed at all.  She also has been requiring more oxygen at home.  Patient has a cough that is producing yellow phlegm and she has some chest discomfort and back pain that is worse with her cough.  Patient also admits to chills, malaise and diaphoresis.  She has not received a flu shot yet.  Past Medical History:  Diagnosis Date  . Anemia   . Anxiety   . Arthritis   . Bulging disc   . Carpal tunnel syndrome   . Cervicalgia   . Chronic airway obstruction (HCC)   . Chronic back pain   . COPD (chronic obstructive pulmonary disease) (HCC)   . Coronary artery disease   . Diabetes mellitus (HCC) 01/11/2012  . GERD (gastroesophageal reflux disease)   . Headache(784.0)   . Hypothyroid   . Lumbago   . Migraine   . Myalgia and myositis, unspecified   . Neuropathy   . Pneumonia   . Restless leg syndrome   . Shortness of breath   . Sleep apnea    uses O2 at home. uses it at home all times and at night at 2 liters.    Patient Active Problem List   Diagnosis Date Noted  . Abdominal pain, epigastric 10/27/2016  . IDA (iron deficiency anemia) 10/27/2016  . GERD (gastroesophageal reflux disease) 10/27/2016  . Esophageal dysphagia 10/27/2016  . Acute respiratory  failure with hypoxia (HCC) 07/08/2016  . Acute respiratory failure with hypercapnia (HCC) 07/08/2016  . Cellulitis 07/07/2016  . Cellulitis of great toe 07/07/2016  . CAP (community acquired pneumonia) 06/20/2016  . Respiratory failure, acute and chronic (HCC) 06/20/2016  . Hypokalemia 06/20/2016  . Carpal tunnel syndrome   . Migraine   . Hypothyroid   . Myalgia and myositis, unspecified   . Lumbago   . Chronic airway obstruction (HCC)   . Cervicalgia   . Anxiety   . Diabetes mellitus (HCC) 01/11/2012  . S/P cubital tunnel release 04/14/2011  . Ulnar nerve compression 04/14/2011  . Pain in joint, forearm 04/14/2011    Past Surgical History:  Procedure Laterality Date  . BIOPSY  12/06/2016   Procedure: BIOPSY;  Surgeon: Corbin Ade, MD;  Location: [facility];  Service: Endoscopy;;  gastric   . BLADDER SUSPENSION    . CARDIAC CATHETERIZATION    . CHOLECYSTECTOMY    . COLONOSCOPY WITH PROPOFOL N/A 12/06/2016   Procedure: COLONOSCOPY WITH PROPOFOL  Formed stool could not do;  Surgeon: Corbin Ade, MD;  Location: [facility];  Service: Endoscopy;  Laterality: N/A;  10:00am  . ESOPHAGOGASTRODUODENOSCOPY (EGD) WITH PROPOFOL N/A 12/06/2016   Procedure: ESOPHAGOGASTRODUODENOSCOPY (EGD) WITH PROPOFOL;  Surgeon:  Rourk, Gerrit Friends, MD;  Location: [facility];  Service: Endoscopy;  Laterality: N/A;  . Elease Hashimoto DILATION N/A 12/06/2016   Procedure: Elease Hashimoto DILATION;  Surgeon: Corbin Ade, MD;  Location: [facility];  Service: Endoscopy;  Laterality: N/A;  . TUBAL LIGATION    . ULNAR NERVE TRANSPOSITION  04/01/2011   Procedure: ULNAR NERVE DECOMPRESSION/TRANSPOSITION;  Surgeon: Dominica Severin, MD;  Location: [facility] OR;  Service: Orthopedics;  Laterality: Left;  ULNAR NERVE RELEASE WITH ANTERIOR TRANS POSITION REPAIR RECONSTRUCTION AS NECESSARY     OB History   None      Home Medications    Prior to Admission medications   Medication Sig Start Date End Date Taking?  Authorizing Provider  albuterol (PROVENTIL HFA;VENTOLIN HFA) 108 (90 BASE) MCG/ACT inhaler Inhale 2 puffs into the lungs every 6 (six) hours as needed for wheezing or shortness of breath.     [provider]  aspirin 81 MG chewable tablet Chew 81 mg 2 (two) times a week by mouth.     [provider]  atorvastatin (LIPITOR) 40 MG tablet Take 40 mg by mouth at bedtime.    [provider]  BIOTIN PO Take 1 tablet 3 (three) times a week by mouth.    [provider]  cetirizine (ZYRTEC) 10 MG tablet Take 10 mg daily by mouth.     [name], MD  CHANTIX 1 MG tablet Take 1 tablet by mouth 2 (two) times daily. 10/19/16   [provider]  clonazePAM (KLONOPIN) 1 MG tablet Take 0.5-2 mg 3 (three) times daily by mouth. Takes 0.5  tablet in the morning and afternoon and  1 tablet at bedtime.    [provider]  doxepin (SINEQUAN) 100 MG capsule Take 100 mg by mouth at bedtime.    [provider]  HYDROcodone-acetaminophen (NORCO) 10-325 MG per tablet Take 1 tablet every 4 (four) hours by mouth.     [provider]  ibuprofen (ADVIL,MOTRIN) 800 MG tablet Take 800 mg 3 (three) times daily by mouth.  07/07/16   [provider]  levothyroxine (SYNTHROID, LEVOTHROID) 75 MCG tablet Take 1 tablet (75 mcg total) by mouth daily before breakfast. 09/05/14   [name], MD  lidocaine (XYLOCAINE) 5 % ointment Apply 1 application topically 4 (four) times daily as needed for mild pain.    [provider]  lisinopril (PRINIVIL,ZESTRIL) 2.5 MG tablet Take 2.5 mg at bedtime by mouth.     [provider]  metFORMIN (GLUCOPHAGE) 500 MG tablet Take 1 tablet (500 mg total) by mouth 2 (two) times daily with a meal. 09/05/14   [name], MD  mometasone (NASONEX) 50 MCG/ACT nasal spray Place 2 sprays into the nose daily as needed (for rhinitis).    [provider]  naphazoline-glycerin (CLEAR EYES) 0.012-0.2 % SOLN  Place 1-2 drops every 4 (four) hours as needed into both eyes for eye irritation.     [provider]  omega-3 acid ethyl esters (LOVAZA) 1 g capsule Take 2 g 2 (two) times daily by mouth.     [provider]  omeprazole (PRILOSEC) 40 MG capsule Take 1 capsule (40 mg total) by mouth daily. Patient taking differently: Take 40 mg by mouth daily as needed (for acid reflux).  09/05/14   [name], MD  polyethylene glycol [facility] Ethelene Hal) packet Take 17 g by mouth daily as needed for mild constipation.    [provider]  polyethylene glycol-electrolytes (TRILYTE) 420 g  solution Take 4,000 mLs by mouth as directed. 10/27/16   Rourk, Gerrit Friends, MD  pregabalin (LYRICA) 300 MG capsule Take 1 capsule (300 mg total) by mouth 2 (two) times daily. 09/05/14   [name], MD  rOPINIRole (REQUIP) 0.5 MG tablet Take 1 tablet (0.5 mg total) by mouth 3 (three) times daily. 09/05/14   [name], MD  valACYclovir (VALTREX) 500 MG tablet Take 500 mg by mouth 2 (two) times daily.    [name], MD    Family History Family History  Problem Relation Age of Onset  . Colon cancer Neg Hx   . Celiac disease Neg Hx     Social History Social History   Tobacco Use  . Smoking status: Current Every Day Smoker    Packs/day: 0.50    Years: 20.00    Pack years: 10.00    Types: Cigarettes, E-cigarettes  . Smokeless tobacco: Never Used  Substance Use Topics  . Alcohol use: No  . Drug use: Yes    Types: Hydrocodone, Marijuana    Comment: occ     Allergies   Duragesic disc transdermal system [fentanyl]   Review of Systems Review of Systems  Constitutional: Positive for activity change, chills and fever.  Respiratory: Positive for cough and shortness of breath.   Cardiovascular: Positive for chest pain.  Musculoskeletal: Positive for myalgias.  Allergic/Immunologic: Negative for immunocompromised state.  Hematological: Does not bruise/bleed easily.  All  other systems reviewed and are negative.    Physical Exam Updated Vital Signs BP 113/67   Pulse (!) 115   Resp 19   Ht 5\' 1"  (1.549 m)   Wt 72.6 kg   SpO2 94%   BMI 30.24 kg/m   Physical Exam  Constitutional: She is oriented to person, place, and time. She appears well-developed. No distress.  HENT:  Head: Normocephalic and atraumatic.  Eyes: EOM are normal.  Neck: Normal range of motion. Neck supple.  Cardiovascular: Intact distal pulses and normal pulses.  Tachycardia  Pulmonary/Chest: Effort normal. No accessory muscle usage. Tachypnea noted. No respiratory distress. She has no decreased breath sounds. She has no wheezes. She has no rhonchi. She has rales in the left middle field and the left lower field.  Abdominal: Bowel sounds are normal.  Neurological: She is alert and oriented to person, place, and time.  Skin: Skin is warm and dry.  Nursing note and vitals reviewed.    ED Treatments / Results  Labs (all labs ordered are listed, but only abnormal results are displayed) Labs Reviewed  COMPREHENSIVE METABOLIC PANEL - Abnormal; Notable for the following components:      Result Value   Potassium 3.2 (*)    Chloride 97 (*)    Glucose, Bld 114 (*)    Calcium 8.7 (*)    All other components within normal limits  CBC WITH DIFFERENTIAL/PLATELET - Abnormal; Notable for the following components:   WBC 19.5 (*)    MCHC 29.6 (*)    RDW 16.9 (*)    Neutro Abs 17.6 (*)    Abs Immature Granulocytes 0.10 (*)    All other components within normal limits  URINALYSIS, ROUTINE W REFLEX MICROSCOPIC - Abnormal; Notable for the following components:   Specific Gravity, Urine <1.005 (*)    Nitrite POSITIVE (*)    Leukocytes, UA SMALL (*)    All other components within normal limits  URINALYSIS, MICROSCOPIC (REFLEX) - Abnormal; Notable for the following components:   Bacteria, UA RARE (*)  All other components within normal limits  I-STAT CG4 LACTIC ACID, ED - Abnormal;  Notable for the following components:   Lactic Acid, Venous 3.37 (*)    All other components within normal limits  CULTURE, BLOOD (ROUTINE X 2)  CULTURE, BLOOD (ROUTINE X 2)  PROTIME-INR  INFLUENZA PANEL BY PCR (TYPE A & B)  I-STAT BETA HCG BLOOD, ED ([facility], [facility], [facility] ONLY)  I-STAT CG4 LACTIC ACID, ED    EKG EKG Interpretation  Date/Time:  Tuesday October 25 2017 23:02:39 EDT Ventricular Rate:  124 PR Interval:    QRS Duration: 137 QT Interval:  340 QTC Calculation: 489 R Axis:   -117 Text Interpretation:  Sinus tachycardia Right bundle branch block Probable inferior infarct, recent No significant change since last tracing Confirmed by Derwood Kaplan [phone]) on 10/25/2017 11:09:01 PM   Radiology Dg Chest 2 View  Result Date: 10/25/2017 CLINICAL DATA:  Cough and fever EXAM: CHEST - 2 VIEW COMPARISON:  06/20/2016 FINDINGS: Streaky atelectasis or infiltrate at the left base. Ground-glass opacity and mild consolidation at the right middle lobe and and right base. Mild cardiomegaly. No pneumothorax. No significant pleural effusion. IMPRESSION: Ground-glass opacity and mild consolidation at the right middle lobe with streaky airspace disease at both bases concerning for multifocal pneumonia. Electronically Signed   By: Jasmine Pang M.D.   On: 10/25/2017 23:50    Procedures .Critical Care Performed by: Derwood Kaplan, MD Authorized by: Derwood Kaplan, MD   Critical care provider statement:    Critical care time (minutes):  45   Critical care start time:  10/26/2017 11:02 AM   Critical care end time:  10/26/2017 2:02 AM   Critical care time was exclusive of:  Separately billable procedures and treating other patients   Critical care was necessary to treat or prevent imminent or life-threatening deterioration of the following conditions:  Sepsis   Critical care was time spent personally by me on the following activities:  Discussions with consultants, evaluation of patient's response  to treatment, examination of patient, ordering and performing treatments and interventions, ordering and review of laboratory studies, ordering and review of radiographic studies, pulse oximetry, re-evaluation of patient's condition, obtaining history from patient or surrogate and review of old charts   (including critical care time)  Medications Ordered in ED Medications  azithromycin (ZITHROMAX) 500 mg in sodium chloride 0.9 % 250 mL IVPB (0 mg Intravenous Stopped 10/26/17 0048)  ceFEPIme (MAXIPIME) 2 g in sodium chloride 0.9 % 100 mL IVPB (0 g Intravenous Stopped 10/26/17 0030)  lactated ringers bolus 1,000 mL (0 mLs Intravenous Stopped 10/26/17 0048)  sodium chloride 0.9 % bolus 1,000 mL (0 mLs Intravenous Stopped 10/26/17 0141)     Initial Impression / Assessment and Plan / ED Course  I have reviewed the triage vital signs and the nursing notes.  Pertinent labs & imaging results that were available during my care of the patient were reviewed by me and considered in my medical decision making (see chart for details).  Clinical Course as of Oct 26 200  Wed Oct 26, 2017  0201 X-ray consistent with multifocal pneumonia.  She has received cefepime and azithromycin already.  DG Chest 2 View [AN]  0201 Nitrite positive urine.  She denies any UTI-like symptoms.  UA otherwise equivocal.  Cefepime should cover a UTI if she had one.  Nitrite(!): POSITIVE [AN]  0202 Flu swab is negative.  Influenza panel by PCR (type A & B) [AN]  0202 Lactic acid is improved.  Stable for admission.  Lactic Acid, Venous: 1.80 [AN]    Clinical Course User Index [AN] Derwood Kaplan, MD    57 year old female comes in with chief complaint of cough. She has history of advanced COPD and diabetes.  Patient is noted to be hypoxic on her home 2 L, therefore her O2 has been increased to 4 L per nasal cannula.  She is also noted to have left-sided rales, and complains of increased cough with yellow sputum  production.  Finally patient has multiple constitutional's consistent with infectious etiology, she has not received influenza vaccination this year and in addition to bacterial pneumonia, influenza is in the differential diagnosis as well.  Plan is to start patient on broad-spectrum antibiotics and initiate sepsis work-up.   Final Clinical Impressions(s) / ED Diagnoses   Final diagnoses:  Severe sepsis (HCC)  Acute respiratory failure with hypoxia [facility])    ED Discharge Orders    None       Derwood Kaplan, MD 10/25/17 2340    Derwood Kaplan, MD 10/26/17 0202

## 2017-10-25 NOTE — ED Triage Notes (Signed)
Pt brought in from home via [facility]. Pt C/O chest pain, cough and lower back pain that began 3 days ago. Pt 324mg  asprin at home.

## 2017-10-26 DIAGNOSIS — E119 Type 2 diabetes mellitus without complications: Secondary | ICD-10-CM | POA: Diagnosis present

## 2017-10-26 DIAGNOSIS — A419 Sepsis, unspecified organism: Secondary | ICD-10-CM | POA: Diagnosis not present

## 2017-10-26 DIAGNOSIS — J9601 Acute respiratory failure with hypoxia: Secondary | ICD-10-CM | POA: Diagnosis not present

## 2017-10-26 DIAGNOSIS — Z23 Encounter for immunization: Secondary | ICD-10-CM | POA: Diagnosis not present

## 2017-10-26 DIAGNOSIS — Z885 Allergy status to narcotic agent status: Secondary | ICD-10-CM | POA: Diagnosis not present

## 2017-10-26 DIAGNOSIS — R6520 Severe sepsis without septic shock: Secondary | ICD-10-CM | POA: Diagnosis not present

## 2017-10-26 DIAGNOSIS — G8929 Other chronic pain: Secondary | ICD-10-CM | POA: Diagnosis present

## 2017-10-26 DIAGNOSIS — I10 Essential (primary) hypertension: Secondary | ICD-10-CM | POA: Diagnosis present

## 2017-10-26 DIAGNOSIS — Z7984 Long term (current) use of oral hypoglycemic drugs: Secondary | ICD-10-CM | POA: Diagnosis not present

## 2017-10-26 DIAGNOSIS — K219 Gastro-esophageal reflux disease without esophagitis: Secondary | ICD-10-CM | POA: Diagnosis present

## 2017-10-26 DIAGNOSIS — E876 Hypokalemia: Secondary | ICD-10-CM | POA: Diagnosis present

## 2017-10-26 DIAGNOSIS — Z9981 Dependence on supplemental oxygen: Secondary | ICD-10-CM | POA: Diagnosis not present

## 2017-10-26 DIAGNOSIS — J189 Pneumonia, unspecified organism: Secondary | ICD-10-CM | POA: Diagnosis not present

## 2017-10-26 DIAGNOSIS — R8271 Bacteriuria: Secondary | ICD-10-CM | POA: Diagnosis present

## 2017-10-26 DIAGNOSIS — G4733 Obstructive sleep apnea (adult) (pediatric): Secondary | ICD-10-CM | POA: Diagnosis present

## 2017-10-26 DIAGNOSIS — J440 Chronic obstructive pulmonary disease with acute lower respiratory infection: Secondary | ICD-10-CM | POA: Diagnosis present

## 2017-10-26 DIAGNOSIS — I2510 Atherosclerotic heart disease of native coronary artery without angina pectoris: Secondary | ICD-10-CM | POA: Diagnosis present

## 2017-10-26 DIAGNOSIS — D72829 Elevated white blood cell count, unspecified: Secondary | ICD-10-CM | POA: Diagnosis not present

## 2017-10-26 DIAGNOSIS — F419 Anxiety disorder, unspecified: Secondary | ICD-10-CM | POA: Diagnosis present

## 2017-10-26 DIAGNOSIS — F17210 Nicotine dependence, cigarettes, uncomplicated: Secondary | ICD-10-CM | POA: Diagnosis present

## 2017-10-26 DIAGNOSIS — E039 Hypothyroidism, unspecified: Secondary | ICD-10-CM | POA: Diagnosis present

## 2017-10-26 DIAGNOSIS — G2581 Restless legs syndrome: Secondary | ICD-10-CM | POA: Diagnosis present

## 2017-10-26 DIAGNOSIS — Z79899 Other long term (current) drug therapy: Secondary | ICD-10-CM | POA: Diagnosis not present

## 2017-10-26 DIAGNOSIS — E785 Hyperlipidemia, unspecified: Secondary | ICD-10-CM | POA: Diagnosis present

## 2017-10-26 DIAGNOSIS — M549 Dorsalgia, unspecified: Secondary | ICD-10-CM | POA: Diagnosis present

## 2017-10-26 DIAGNOSIS — Z7982 Long term (current) use of aspirin: Secondary | ICD-10-CM | POA: Diagnosis not present

## 2017-10-26 DIAGNOSIS — J9621 Acute and chronic respiratory failure with hypoxia: Secondary | ICD-10-CM | POA: Diagnosis not present

## 2017-10-26 LAB — COMPREHENSIVE METABOLIC PANEL
ALT: 18 U/L (ref 0–44)
AST: 21 U/L (ref 15–41)
Albumin: 3.1 g/dL — ABNORMAL LOW (ref 3.5–5.0)
Alkaline Phosphatase: 76 U/L (ref 38–126)
Anion gap: 8 (ref 5–15)
BUN: 13 mg/dL (ref 6–20)
CO2: 26 mmol/L (ref 22–32)
CREATININE: 0.52 mg/dL (ref 0.44–1.00)
Calcium: 8 mg/dL — ABNORMAL LOW (ref 8.9–10.3)
Chloride: 107 mmol/L (ref 98–111)
Glucose, Bld: 122 mg/dL — ABNORMAL HIGH (ref 70–99)
Potassium: 3.2 mmol/L — ABNORMAL LOW (ref 3.5–5.1)
SODIUM: 141 mmol/L (ref 135–145)
TOTAL PROTEIN: 6.4 g/dL — AB (ref 6.5–8.1)
Total Bilirubin: 0.4 mg/dL (ref 0.3–1.2)

## 2017-10-26 LAB — CBC
HCT: 36.5 % (ref 36.0–46.0)
HEMOGLOBIN: 11.1 g/dL — AB (ref 12.0–15.0)
MCH: 27.9 pg (ref 26.0–34.0)
MCHC: 30.4 g/dL (ref 30.0–36.0)
MCV: 91.7 fL (ref 80.0–100.0)
Platelets: 203 10*3/uL (ref 150–400)
RBC: 3.98 MIL/uL (ref 3.87–5.11)
RDW: 17 % — ABNORMAL HIGH (ref 11.5–15.5)
WBC: 18.4 10*3/uL — ABNORMAL HIGH (ref 4.0–10.5)
nRBC: 0 % (ref 0.0–0.2)

## 2017-10-26 LAB — CBG MONITORING, ED
GLUCOSE-CAPILLARY: 115 mg/dL — AB (ref 70–99)
Glucose-Capillary: 109 mg/dL — ABNORMAL HIGH (ref 70–99)

## 2017-10-26 LAB — URINALYSIS, ROUTINE W REFLEX MICROSCOPIC
Bilirubin Urine: NEGATIVE
Glucose, UA: NEGATIVE mg/dL
Hgb urine dipstick: NEGATIVE
Ketones, ur: NEGATIVE mg/dL
NITRITE: POSITIVE — AB
PH: 5.5 (ref 5.0–8.0)
Protein, ur: NEGATIVE mg/dL

## 2017-10-26 LAB — URINALYSIS, MICROSCOPIC (REFLEX)

## 2017-10-26 LAB — INFLUENZA PANEL BY PCR (TYPE A & B)
INFLBPCR: NEGATIVE
Influenza A By PCR: NEGATIVE

## 2017-10-26 LAB — STREP PNEUMONIAE URINARY ANTIGEN: Strep Pneumo Urinary Antigen: NEGATIVE

## 2017-10-26 LAB — GLUCOSE, CAPILLARY: Glucose-Capillary: 182 mg/dL — ABNORMAL HIGH (ref 70–99)

## 2017-10-26 LAB — I-STAT CG4 LACTIC ACID, ED: LACTIC ACID, VENOUS: 1.8 mmol/L (ref 0.5–1.9)

## 2017-10-26 MED ORDER — IBUPROFEN 400 MG PO TABS
600.0000 mg | ORAL_TABLET | Freq: Once | ORAL | Status: AC
  Administered 2017-10-26: 600 mg via ORAL

## 2017-10-26 MED ORDER — POTASSIUM CHLORIDE 10 MEQ/100ML IV SOLN
10.0000 meq | INTRAVENOUS | Status: AC
  Administered 2017-10-26 (×2): 10 meq via INTRAVENOUS
  Filled 2017-10-26 (×2): qty 100

## 2017-10-26 MED ORDER — ENOXAPARIN SODIUM 40 MG/0.4ML ~~LOC~~ SOLN
40.0000 mg | SUBCUTANEOUS | Status: DC
  Administered 2017-10-26 – 2017-10-28 (×3): 40 mg via SUBCUTANEOUS
  Filled 2017-10-26 (×3): qty 0.4

## 2017-10-26 MED ORDER — POTASSIUM CHLORIDE IN NACL 20-0.45 MEQ/L-% IV SOLN
INTRAVENOUS | Status: DC
  Administered 2017-10-26 – 2017-10-27 (×2): via INTRAVENOUS
  Filled 2017-10-26 (×3): qty 1000

## 2017-10-26 MED ORDER — IBUPROFEN 400 MG PO TABS
ORAL_TABLET | ORAL | Status: AC
  Administered 2017-10-26: 600 mg via ORAL
  Filled 2017-10-26: qty 2

## 2017-10-26 MED ORDER — LEVOTHYROXINE SODIUM 75 MCG PO TABS
75.0000 ug | ORAL_TABLET | Freq: Every day | ORAL | Status: DC
  Administered 2017-10-26 – 2017-10-28 (×3): 75 ug via ORAL
  Filled 2017-10-26 (×2): qty 1
  Filled 2017-10-26: qty 2

## 2017-10-26 MED ORDER — HYDROCORTISONE NA SUCCINATE PF 100 MG IJ SOLR
50.0000 mg | Freq: Four times a day (QID) | INTRAMUSCULAR | Status: DC
  Administered 2017-10-26 – 2017-10-28 (×8): 50 mg via INTRAVENOUS
  Filled 2017-10-26 (×8): qty 2

## 2017-10-26 MED ORDER — ACETAMINOPHEN 325 MG PO TABS
ORAL_TABLET | ORAL | Status: AC
  Filled 2017-10-26: qty 2

## 2017-10-26 MED ORDER — PREGABALIN 75 MG PO CAPS
300.0000 mg | ORAL_CAPSULE | Freq: Two times a day (BID) | ORAL | Status: DC
  Administered 2017-10-26 – 2017-10-28 (×5): 300 mg via ORAL
  Filled 2017-10-26 (×5): qty 4

## 2017-10-26 MED ORDER — HYDROCODONE-ACETAMINOPHEN 10-325 MG PO TABS
1.0000 | ORAL_TABLET | Freq: Four times a day (QID) | ORAL | Status: DC | PRN
  Administered 2017-10-26 – 2017-10-28 (×5): 1 via ORAL
  Filled 2017-10-26 (×5): qty 1

## 2017-10-26 MED ORDER — LORATADINE 10 MG PO TABS
10.0000 mg | ORAL_TABLET | Freq: Every day | ORAL | Status: DC
  Administered 2017-10-26 – 2017-10-28 (×3): 10 mg via ORAL
  Filled 2017-10-26 (×3): qty 1

## 2017-10-26 MED ORDER — CLONAZEPAM 0.5 MG PO TABS
0.5000 mg | ORAL_TABLET | Freq: Three times a day (TID) | ORAL | Status: DC
  Administered 2017-10-26: 2 mg via ORAL
  Administered 2017-10-26 (×2): 0.5 mg via ORAL
  Administered 2017-10-27 – 2017-10-28 (×4): 2 mg via ORAL
  Filled 2017-10-26 (×2): qty 4
  Filled 2017-10-26: qty 1
  Filled 2017-10-26: qty 4
  Filled 2017-10-26: qty 1
  Filled 2017-10-26 (×2): qty 4

## 2017-10-26 MED ORDER — ASPIRIN 81 MG PO CHEW
81.0000 mg | CHEWABLE_TABLET | ORAL | Status: DC
  Administered 2017-10-27: 81 mg via ORAL
  Filled 2017-10-26: qty 1

## 2017-10-26 MED ORDER — SODIUM CHLORIDE 0.9 % IV BOLUS: 1000.0000 mL | Freq: Once | INTRAVENOUS | Status: DC

## 2017-10-26 MED ORDER — INFLUENZA VAC SPLIT QUAD 0.5 ML IM SUSY
0.5000 mL | PREFILLED_SYRINGE | INTRAMUSCULAR | Status: AC
  Administered 2017-10-27: 0.5 mL via INTRAMUSCULAR
  Filled 2017-10-26: qty 0.5

## 2017-10-26 MED ORDER — ORAL CARE MOUTH RINSE
15.0000 mL | Freq: Two times a day (BID) | OROMUCOSAL | Status: DC
  Administered 2017-10-26 – 2017-10-28 (×4): 15 mL via OROMUCOSAL

## 2017-10-26 MED ORDER — ONDANSETRON HCL 4 MG PO TABS: 4.0000 mg | ORAL_TABLET | Freq: Four times a day (QID) | ORAL | Status: DC | PRN

## 2017-10-26 MED ORDER — LISINOPRIL 5 MG PO TABS: 2.5000 mg | ORAL_TABLET | Freq: Every day | ORAL | Status: DC

## 2017-10-26 MED ORDER — IBUPROFEN 400 MG PO TABS
600.0000 mg | ORAL_TABLET | Freq: Three times a day (TID) | ORAL | Status: DC | PRN
  Administered 2017-10-26 (×2): 600 mg via ORAL
  Filled 2017-10-26 (×2): qty 2

## 2017-10-26 MED ORDER — VALACYCLOVIR HCL 500 MG PO TABS
500.0000 mg | ORAL_TABLET | Freq: Two times a day (BID) | ORAL | Status: DC
  Administered 2017-10-26 – 2017-10-28 (×5): 500 mg via ORAL
  Filled 2017-10-26 (×7): qty 1

## 2017-10-26 MED ORDER — SODIUM CHLORIDE 0.9 % IV BOLUS
1000.0000 mL | Freq: Once | INTRAVENOUS | Status: AC
  Administered 2017-10-26: 1000 mL via INTRAVENOUS

## 2017-10-26 MED ORDER — ATORVASTATIN CALCIUM 40 MG PO TABS
40.0000 mg | ORAL_TABLET | Freq: Every day | ORAL | Status: DC
  Administered 2017-10-26 – 2017-10-27 (×2): 40 mg via ORAL
  Filled 2017-10-26 (×2): qty 1

## 2017-10-26 MED ORDER — ONDANSETRON HCL 4 MG/2ML IJ SOLN: 4.0000 mg | Freq: Four times a day (QID) | INTRAMUSCULAR | Status: DC | PRN

## 2017-10-26 MED ORDER — PANTOPRAZOLE SODIUM 40 MG PO TBEC
40.0000 mg | DELAYED_RELEASE_TABLET | Freq: Every day | ORAL | Status: DC
  Administered 2017-10-26 – 2017-10-28 (×3): 40 mg via ORAL
  Filled 2017-10-26 (×3): qty 1

## 2017-10-26 MED ORDER — SODIUM CHLORIDE 0.9 % IV SOLN
2.0000 g | Freq: Three times a day (TID) | INTRAVENOUS | Status: DC
  Administered 2017-10-26 – 2017-10-28 (×7): 2 g via INTRAVENOUS
  Filled 2017-10-26 (×16): qty 2

## 2017-10-26 MED ORDER — SODIUM CHLORIDE 0.45 % IV SOLN
INTRAVENOUS | Status: DC
  Filled 2017-10-26 (×2): qty 1000

## 2017-10-26 MED ORDER — DOXEPIN HCL 25 MG PO CAPS
100.0000 mg | ORAL_CAPSULE | Freq: Every evening | ORAL | Status: DC | PRN
  Administered 2017-10-26: 100 mg via ORAL
  Filled 2017-10-26: qty 2

## 2017-10-26 MED ORDER — INSULIN ASPART 100 UNIT/ML ~~LOC~~ SOLN
0.0000 [IU] | Freq: Three times a day (TID) | SUBCUTANEOUS | Status: DC
  Administered 2017-10-26 – 2017-10-27 (×3): 2 [IU] via SUBCUTANEOUS
  Administered 2017-10-27: 1 [IU] via SUBCUTANEOUS
  Administered 2017-10-28: 2 [IU] via SUBCUTANEOUS
  Administered 2017-10-28: 1 [IU] via SUBCUTANEOUS

## 2017-10-26 MED ORDER — ACETAMINOPHEN 325 MG PO TABS: 650.0000 mg | ORAL_TABLET | Freq: Once | ORAL | Status: DC

## 2017-10-26 MED ORDER — SODIUM CHLORIDE 0.9 % IV SOLN
INTRAVENOUS | Status: DC
  Administered 2017-10-26: 05:00:00 via INTRAVENOUS

## 2017-10-26 MED ORDER — ROPINIROLE HCL 0.25 MG PO TABS
0.5000 mg | ORAL_TABLET | Freq: Three times a day (TID) | ORAL | Status: DC
  Administered 2017-10-26 – 2017-10-28 (×7): 0.5 mg via ORAL
  Filled 2017-10-26: qty 1
  Filled 2017-10-26: qty 2
  Filled 2017-10-26 (×3): qty 1
  Filled 2017-10-26: qty 2
  Filled 2017-10-26 (×3): qty 1
  Filled 2017-10-26: qty 2
  Filled 2017-10-26: qty 1
  Filled 2017-10-26 (×2): qty 2

## 2017-10-26 NOTE — H&P (Addendum)
TRH H&P    Patient Demographics:    Patricia Bass, is a 57 y.o. female  MRN: [id number]  DOB - Jun 14, 1960  Admit Date - 10/25/2017  Referring MD/NP/PA: Dr. Kathrynn Humble  Outpatient Primary MD for the patient is Sinda Du, MD  Patient coming from: Home  Chief complaint-cough and shortness of breath   HPI:    Patricia Bass  is a 57 y.o. female, with a history of diabetes mellitus, CAD, COPD on 2 L of oxygen at home, obstructive sleep apnea on oxygen at home, GERD, hypothyroidism, restless leg syndrome, chronic back pain came to hospital with cough and shortness of breath.  Patient says that she has been feeling sick for past 3 days.  She has been coughing up yellow-colored phlegm.  Had a high fever at home.  Denies chest pain Denies nausea vomiting or diarrhea. Denies passing out.  No blurred vision. No previous history of stroke or seizures.  In the ED chest x-ray showed multifocal pneumonia, patient started on cefepime and Zithromax in the ED.  Also has abnormal UA with positive nitrite.  The patient denies any dysuria. Lab work showed lactic acid 3.37, WBC 19,000.   Review of systems:    In addition to the HPI above,   All other systems reviewed and are negative.   With Past History of the following :    Past Medical History:  Diagnosis Date  . Anemia   . Anxiety   . Arthritis   . Bulging disc   . Carpal tunnel syndrome   . Cervicalgia   . Chronic airway obstruction ([place])   . Chronic back pain   . COPD (chronic obstructive pulmonary disease) ([place])   . Coronary artery disease   . Diabetes mellitus ([place]) 01/11/2012  . GERD (gastroesophageal reflux disease)   . Headache(784.0)   . Hypothyroid   . Lumbago   . Migraine   . Myalgia and myositis, unspecified   . Neuropathy   . Pneumonia   . Restless leg syndrome   . Shortness of breath   . Sleep apnea    uses O2 at home. uses it at home  all times and at night at 2 liters.      Past Surgical History:  Procedure Laterality Date  . BIOPSY  12/06/2016   Procedure: BIOPSY;  Surgeon: Daneil Dolin, MD;  [facility];  Service: Endoscopy;;  gastric   . BLADDER SUSPENSION    . CARDIAC CATHETERIZATION    . CHOLECYSTECTOMY    . COLONOSCOPY WITH PROPOFOL N/A 12/06/2016   Procedure: COLONOSCOPY WITH PROPOFOL  Formed stool could not do;  Surgeon: Daneil Dolin, MD;  [facility];  Service: Endoscopy;  Laterality: N/A;  10:00am  . ESOPHAGOGASTRODUODENOSCOPY (EGD) WITH PROPOFOL N/A 12/06/2016   Procedure: ESOPHAGOGASTRODUODENOSCOPY (EGD) WITH PROPOFOL;  Surgeon: Daneil Dolin, MD;  [facility];  Service: Endoscopy;  Laterality: N/A;  . Venia Minks DILATION N/A 12/06/2016   Procedure: Venia Minks DILATION;  Surgeon: Daneil Dolin, MD;  [facility];  Service: Endoscopy;  Laterality: N/A;  . TUBAL LIGATION    . ULNAR NERVE TRANSPOSITION  04/01/2011   Procedure: ULNAR NERVE DECOMPRESSION/TRANSPOSITION;  Surgeon: Roseanne Kaufman, MD;  Location: [place];  Service: Orthopedics;  Laterality: Left;  ULNAR NERVE RELEASE WITH ANTERIOR TRANS POSITION REPAIR RECONSTRUCTION AS NECESSARY      Social History:      Social History   Tobacco Use  . Smoking status: Current Every Day Smoker    Packs/day: 0.50    Years: 20.00    Pack years: 10.00    Types: Cigarettes, E-cigarettes  . Smokeless tobacco: Never Used  Substance Use Topics  . Alcohol use: No       Family History :     Family History  Problem Relation Age of Onset  . Colon cancer Neg Hx   . Celiac disease Neg Hx       Home Medications:   Prior to Admission medications   Medication Sig Start Date End Date Taking? Authorizing Provider  albuterol (PROVENTIL HFA;VENTOLIN HFA) 108 (90 BASE) MCG/ACT inhaler Inhale 2 puffs into the lungs every 6 (six) hours as needed for wheezing or shortness of breath.     [provider]    aspirin 81 MG chewable tablet Chew 81 mg 2 (two) times a week by mouth.     [provider]  atorvastatin (LIPITOR) 40 MG tablet Take 40 mg by mouth at bedtime.    [provider]  BIOTIN PO Take 1 tablet 3 (three) times a week by mouth.    [provider]  cetirizine (ZYRTEC) 10 MG tablet Take 10 mg daily by mouth.     Sinda Du, MD  CHANTIX 1 MG tablet Take 1 tablet by mouth 2 (two) times daily. 10/19/16   [provider]  clonazePAM (KLONOPIN) 1 MG tablet Take 0.5-2 mg 3 (three) times daily by mouth. Takes 0.5  tablet in the morning and afternoon and  1 tablet at bedtime.    [provider]  doxepin (SINEQUAN) 100 MG capsule Take 100 mg by mouth at bedtime.    [provider]  HYDROcodone-acetaminophen (NORCO) 10-325 MG per tablet Take 1 tablet every 4 (four) hours by mouth.     [provider]  ibuprofen (ADVIL,MOTRIN) 800 MG tablet Take 800 mg 3 (three) times daily by mouth.  07/07/16   [provider]  levothyroxine (SYNTHROID, LEVOTHROID) 75 MCG tablet Take 1 tablet (75 mcg total) by mouth daily before breakfast. 09/05/14   Davonna Belling, MD  lidocaine (XYLOCAINE) 5 % ointment Apply 1 application topically 4 (four) times daily as needed for mild pain.    [provider]  lisinopril (PRINIVIL,ZESTRIL) 2.5 MG tablet Take 2.5 mg at bedtime by mouth.     [provider]  metFORMIN (GLUCOPHAGE) 500 MG tablet Take 1 tablet (500 mg total) by mouth 2 (two) times daily with a meal. 09/05/14   Davonna Belling, MD  mometasone (NASONEX) 50 MCG/ACT nasal spray Place 2 sprays into the nose daily as needed (for rhinitis).    [provider]  naphazoline-glycerin (CLEAR EYES) 0.012-0.2 % SOLN Place 1-2 drops every 4 (four) hours as needed into both eyes for eye irritation.     [provider]  omega-3 acid ethyl esters (LOVAZA) 1 g capsule Take 2 g 2 (two) times daily by mouth.     [provider]  omeprazole (PRILOSEC) 40 MG capsule Take 1 capsule (40 mg total) by mouth daily.  Patient taking differently: Take 40 mg by mouth daily as needed (for acid reflux).  09/05/14   Davonna Belling, MD  polyethylene glycol [facility] Floria Raveling) packet Take 17 g by mouth daily as needed for mild constipation.    [provider]  polyethylene glycol-electrolytes (TRILYTE) 420 g solution Take 4,000 mLs by mouth as directed. 10/27/16   Rourk, Cristopher Estimable, MD  pregabalin (LYRICA) 300 MG capsule Take 1 capsule (300 mg total) by mouth 2 (two) times daily. 09/05/14   Davonna Belling, MD  rOPINIRole (REQUIP) 0.5 MG tablet Take 1 tablet (0.5 mg total) by mouth 3 (three) times daily. 09/05/14   Davonna Belling, MD  valACYclovir (VALTREX) 500 MG tablet Take 500 mg by mouth 2 (two) times daily.    Sinda Du, MD     Allergies:     Allergies  Allergen Reactions  . Duragesic Disc Transdermal System [Fentanyl] Nausea And Vomiting     Physical Exam:   Vitals  Blood pressure (!) 103/58, pulse (!) 103, resp. rate 19, height '5\' 1"'  (1.549 m), weight 72.6 kg, SpO2 91 %.  1.  General: Appears in no acute distress  2. Psychiatric:  Intact judgement and  insight, awake alert, oriented x 3.  3. Neurologic: No focal neurological deficits, all cranial nerves intact.Strength 5/5 all 4 extremities, sensation intact all 4 extremities, plantars down going.  4. Eyes :  anicteric sclerae, moist conjunctivae with no lid lag. PERRLA.  5. ENMT:  Oropharynx clear with moist mucous membranes and good dentition  6. Neck:  supple, no cervical lymphadenopathy appriciated, No thyromegaly  7. Respiratory : Normal respiratory effort, decreased breath sounds at lung bases.  8. Cardiovascular : RRR, no gallops, rubs or murmurs, no leg edema  9. Gastrointestinal:  Positive bowel sounds, abdomen soft, non-tender to palpation,no hepatosplenomegaly, no rigidity or guarding       10. Skin:  No  cyanosis, normal texture and turgor, no rash, lesions or ulcers  11.Musculoskeletal:  Good muscle tone,  joints appear normal , no effusions,  normal range of motion    Data Review:    CBC Recent Labs  Lab 10/25/17 2313  WBC 19.5*  HGB 12.2  HCT 41.2  PLT 208  MCV 90.5  MCH 26.8  MCHC 29.6*  RDW 16.9*  LYMPHSABS 0.9  MONOABS 0.9  EOSABS 0.0  BASOSABS 0.0   ------------------------------------------------------------------------------------------------------------------  Results for orders placed or performed during the hospital encounter of 10/25/17 (from the past 48 hour(s))  Urinalysis, Routine w reflex microscopic     Status: Abnormal   Collection Time: 10/25/17 11:06 PM  Result Value Ref Range   Color, Urine YELLOW YELLOW    Comment: YELLOW   APPearance CLEAR CLEAR    Comment: CLEAR   Specific Gravity, Urine <1.005 (L) 1.005 - 1.030   pH 5.5 5.0 - 8.0   Glucose, UA NEGATIVE NEGATIVE mg/dL   Hgb urine dipstick NEGATIVE NEGATIVE   Bilirubin Urine NEGATIVE NEGATIVE   Ketones, ur NEGATIVE NEGATIVE mg/dL   Protein, ur NEGATIVE NEGATIVE mg/dL   Nitrite POSITIVE (A) NEGATIVE   Leukocytes, UA SMALL (A) NEGATIVE    Comment: Performed at [facility], [address] [place], [place] [address]  Urinalysis, Microscopic (reflex)     Status: Abnormal   Collection Time: 10/25/17 11:06 PM  Result Value Ref Range   RBC / HPF 0-5 0 - 5 RBC/hpf   WBC, UA 0-5 0 - 5 WBC/hpf   Bacteria, UA RARE (A) NONE SEEN  Squamous Epithelial / LPF 0-5 0 - 5    Comment: Performed at [facility], [address] [place], [place] [address]  Comprehensive metabolic panel     Status: Abnormal   Collection Time: 10/25/17 11:13 PM  Result Value Ref Range   Sodium 137 135 - 145 mmol/L   Potassium 3.2 (L) 3.5 - 5.1 mmol/L   Chloride 97 (L) 98 - 111 mmol/L   CO2 28 22 - 32 mmol/L   Glucose, Bld 114 (H) 70 - 99 mg/dL   BUN 16 6 - 20 mg/dL   Creatinine, Ser 0.80 0.44 - 1.00 mg/dL   Calcium 8.7  (L) 8.9 - 10.3 mg/dL   Total Protein 7.9 6.5 - 8.1 g/dL   Albumin 3.9 3.5 - 5.0 g/dL   AST 20 15 - 41 U/L   ALT 18 0 - 44 U/L   Alkaline Phosphatase 94 38 - 126 U/L   Total Bilirubin 0.8 0.3 - 1.2 mg/dL   GFR calc non Af Amer >60 >60 mL/min   GFR calc Af Amer >60 >60 mL/min    Comment: (NOTE) The eGFR has been calculated using the CKD EPI equation. This calculation has not been validated in all clinical situations. eGFR's persistently <60 mL/min signify possible Chronic Kidney Disease.    Anion gap 12 5 - 15    Comment: Performed at [facility], [address] [place], [place] [address]  CBC with Differential     Status: Abnormal   Collection Time: 10/25/17 11:13 PM  Result Value Ref Range   WBC 19.5 (H) 4.0 - 10.5 K/uL   RBC 4.55 3.87 - 5.11 MIL/uL   Hemoglobin 12.2 12.0 - 15.0 g/dL   HCT 41.2 36.0 - 46.0 %   MCV 90.5 80.0 - 100.0 fL   MCH 26.8 26.0 - 34.0 pg   MCHC 29.6 (L) 30.0 - 36.0 g/dL   RDW 16.9 (H) 11.5 - 15.5 %   Platelets 208 150 - 400 K/uL   nRBC 0.0 0.0 - 0.2 %   Neutrophils Relative % 90 %   Neutro Abs 17.6 (H) 1.7 - 7.7 K/uL   Lymphocytes Relative 4 %   Lymphs Abs 0.9 0.7 - 4.0 K/uL   Monocytes Relative 5 %   Monocytes Absolute 0.9 0.1 - 1.0 K/uL   Eosinophils Relative 0 %   Eosinophils Absolute 0.0 0.0 - 0.5 K/uL   Basophils Relative 0 %   Basophils Absolute 0.0 0.0 - 0.1 K/uL   Immature Granulocytes 1 %   Abs Immature Granulocytes 0.10 (H) 0.00 - 0.07 K/uL    Comment: Performed at [facility], [address], [place], [place] [address]  Protime-INR     Status: None   Collection Time: 10/25/17 11:13 PM  Result Value Ref Range   Prothrombin Time 13.2 11.4 - 15.2 seconds   INR 1.01     Comment: Performed at [facility], [address] [place], [place] [address]  Culture, blood (Routine x 2)     Status: None (Preliminary result)   Collection Time: 10/25/17 11:13 PM  Result Value Ref Range   Specimen Description BLOOD RIGHT ANTECUBITAL    Special  Requests      BOTTLES DRAWN AEROBIC AND ANAEROBIC Blood Culture adequate volume Performed at [facility] [address], [place], [place] [address]    Culture PENDING    Report Status PENDING   I-Stat beta hCG blood, ED     Status: None   Collection Time: 10/25/17  11:23 PM  Result Value Ref Range   I-stat hCG, quantitative <5.0 <5 mIU/mL   Comment 3            Comment:   GEST. AGE      CONC.  (mIU/mL)   <=1 WEEK        5 - 50     2 WEEKS       50 - 500     3 WEEKS       100 - 10,000     4 WEEKS     1,000 - 30,000        FEMALE AND NON-PREGNANT FEMALE:     LESS THAN 5 mIU/mL   I-Stat CG4 Lactic Acid, ED     Status: Abnormal   Collection Time: 10/25/17 11:24 PM  Result Value Ref Range   Lactic Acid, Venous 3.37 (HH) 0.5 - 1.9 mmol/L  Culture, blood (Routine x 2)     Status: None (Preliminary result)   Collection Time: 10/25/17 11:24 PM  Result Value Ref Range   Specimen Description BLOOD LEFT HAND    Special Requests      BOTTLES DRAWN AEROBIC AND ANAEROBIC Blood Culture adequate volume Performed at [facility], [address], [place], [place] [address]    Culture PENDING    Report Status PENDING   Influenza panel by PCR (type A & B)     Status: None   Collection Time: 10/25/17 11:32 PM  Result Value Ref Range   Influenza A By PCR NEGATIVE NEGATIVE   Influenza B By PCR NEGATIVE NEGATIVE    Comment: (NOTE) The Xpert Xpress Flu assay is intended as an aid in the diagnosis of  influenza and should not be used as a sole basis for treatment.  This  assay is FDA approved for nasopharyngeal swab specimens only. Nasal  washings and aspirates are unacceptable for Xpert Xpress Flu testing. Performed at [facility], [address] [place], [place] [address]   I-Stat CG4 Lactic Acid, ED     Status: None   Collection Time: 10/26/17  1:12 AM  Result Value Ref Range   Lactic Acid, Venous 1.80 0.5 - 1.9 mmol/L    Chemistries  Recent Labs  Lab 10/25/17 2313  NA 137  K 3.2*  CL  97*  CO2 28  GLUCOSE 114*  BUN 16  CREATININE 0.80  CALCIUM 8.7*  AST 20  ALT 18  ALKPHOS 94  BILITOT 0.8   ------------------------------------------------------------------------------------------------------------------  ------------------------------------------------------------------------------------------------------------------ GFR: Estimated Creatinine Clearance: 69.8 mL/min (by C-G formula based on SCr of 0.8 mg/dL). Liver Function Tests: Recent Labs  Lab 10/25/17 2313  AST 20  ALT 18  ALKPHOS 94  BILITOT 0.8  PROT 7.9  ALBUMIN 3.9   No results for input(s): LIPASE, AMYLASE in the last 168 hours. No results for input(s): AMMONIA in the last 168 hours. Coagulation Profile: Recent Labs  Lab 10/25/17 2313  INR 1.01   Cardiac Enzymes: No results for input(s): CKTOTAL, CKMB, CKMBINDEX, TROPONINI in the last 168 hours. BNP (last 3 results) No results for input(s): PROBNP in the last 8760 hours. HbA1C: No results for input(s): HGBA1C in the last 72 hours. CBG: No results for input(s): GLUCAP in the last 168 hours. Lipid Profile: No results for input(s): CHOL, HDL, LDLCALC, TRIG, CHOLHDL, LDLDIRECT in the last 72 hours. Thyroid Function Tests: No results for input(s): TSH, T4TOTAL, FREET4, T3FREE, THYROIDAB in the last 72 hours. Anemia Panel: No results for input(s): VITAMINB12, FOLATE,  FERRITIN, TIBC, IRON, RETICCTPCT in the last 72 hours.  --------------------------------------------------------------------------------------------------------------- Urine analysis:    Component Value Date/Time   COLORURINE YELLOW 10/25/2017 2306   APPEARANCEUR CLEAR 10/25/2017 2306   LABSPEC <1.005 (L) 10/25/2017 2306   PHURINE 5.5 10/25/2017 2306   GLUCOSEU NEGATIVE 10/25/2017 2306   HGBUR NEGATIVE 10/25/2017 2306   BILIRUBINUR NEGATIVE 10/25/2017 2306   KETONESUR NEGATIVE 10/25/2017 2306   PROTEINUR NEGATIVE 10/25/2017 2306   NITRITE POSITIVE (A) 10/25/2017 2306     LEUKOCYTESUR SMALL (A) 10/25/2017 2306      Imaging Results:    Dg Chest 2 View  Result Date: 10/25/2017 CLINICAL DATA:  Cough and fever EXAM: CHEST - 2 VIEW COMPARISON:  06/20/2016 FINDINGS: Streaky atelectasis or infiltrate at the left base. Ground-glass opacity and mild consolidation at the right middle lobe and and right base. Mild cardiomegaly. No pneumothorax. No significant pleural effusion. IMPRESSION: Ground-glass opacity and mild consolidation at the right middle lobe with streaky airspace disease at both bases concerning for multifocal pneumonia. Electronically Signed   By: Donavan Foil M.D.   On: 10/25/2017 23:50    My personal review of EKG: Rhythm NSR   Assessment & Plan:    Active Problems:   CAP (community acquired pneumonia)   1. Sepsis -patient presented with sepsis physiology, with tachycardia, hypoxia, lactic acid elevated 3.87, leukocytosis 19,000, due to community-acquired pneumonia-chest x-ray shows multifocal pneumonia, patient started on cefepime and Zithromax.  Follow blood culture results.  Also follow urine culture results.  2. Acute on chronic respiratory failure -patient has underlying COPD and is on 2 L of oxygen at all times, now presenting with worsening of respiratory status due to community-acquired pneumonia-started on IV antibiotics as above.  Will obtain urinary strep pneumo antigen.  3. Diabetes mellitus type 2-hold metformin, start sliding scale insulin with NovoLog.  4. Hypokalemia-potassium 3.2, will replace potassium and check BMP in am.  5. Restless leg syndrome-continue home medications including Requip, Lyrica  6. Hypertension-blood pressure stable, continue lisinopril   DVT Prophylaxis-   Lovenox   AM Labs Ordered, also please review Full Orders  Family Communication: Admission, patients condition and plan of care including tests being ordered have been discussed with the patient and her fianc at bedside who indicate  understanding and agree with the plan and Code Status.  Code Status: Full code  Admission status: Inpatient: Based on patients clinical presentation and evaluation of above clinical data, I have made determination that patient meets Inpatient criteria at this time.  Patient requiring IV antibiotics for multifocal pneumonia  Time spent in minutes : 60 minutes   Oswald Hillock M.D on 10/26/2017 at 3:20 AM  Between 7am to 7pm - Pager - [phone]. After 7pm go to [email or web address] - password [facility]  [facility] - Office  [phone]

## 2017-10-26 NOTE — Progress Notes (Signed)
Pharmacy Antibiotic Note  GUSTAVO MEDITZ is a 57 y.o. female admitted on 10/25/2017 with pneumonia.  Pharmacy has been consulted for Cefepime dosing.  Plan: Cefepime 2000 mg IV every 8 hours.  Monitor labs, c/s, and patient improvement.  Height: 5\' 1"  (154.9 cm) Weight: 160 lb 0.9 oz (72.6 kg) IBW/kg (Calculated) : 47.8  Temp (24hrs), Avg:98.6 F (37 C), Min:98.6 F (37 C), Max:98.6 F (37 C)  Recent Labs  Lab 10/25/17 2313 10/25/17 2324 10/26/17 0112 10/26/17 0624  WBC 19.5*  --   --  18.4*  CREATININE 0.80  --   --  0.52  LATICACIDVEN  --  3.37* 1.80  --     Estimated Creatinine Clearance: 69.8 mL/min (by C-G formula based on SCr of 0.52 mg/dL).    Allergies  Allergen Reactions  . Duragesic Disc Transdermal System [Fentanyl] Nausea And Vomiting    Antimicrobials this admission: Cefepime 10/22>>  Azithromycin 10/22 >>   Dose adjustments this admission: N/A  Microbiology results: 10/22 BCx:  10/22 UCx:      Thank you for allowing pharmacy to be a part of this patient's care.  Tad Moore 10/26/2017 7:54 AM

## 2017-10-26 NOTE — Progress Notes (Signed)
PROGRESS NOTE  Brief Narrative: Patricia Bass is a 57 y.o. female with a history of 2L-dependent COPD, OSA, CAD among others who presented to the ED late last night with productive cough, weakness, and shortness of breath associated with high fever at home. She was hypoxic worse than baseline and appeared to have sepsis with lactic acid 3.37, WBC 19k, tachycardia, and mild hypotension due to multifocal pneumonia based on CXR. Flu negative. Cefepime and azithromycin were started pending culture data, and she was admitted this morning by Dr. Sharl Ma.  Subjective: Seen in the ED awaiting a bed, she is tired, really just wants to sleep and not talk to me much. Says she still feels very weak and having about the same trouble breathing as admission a couple hours ago, but generally better than when she came to the ED. Isn't sure, but thinks she used to be on prednisone and was taken off.  Objective: BP 107/69   Pulse 92   Temp 98.6 F (37 C) (Oral)   Resp 15   Ht 5\' 1"  (1.549 m)   Wt 72.6 kg   SpO2 91%   BMI 30.24 kg/m   Gen: Chronically ill-appearing female resting quietly Pulm: Decreased aeration globally worst at bases, slightly tachypneic at time of my evaluation. No wheezes. CV: Regular tachycardia, no JVD, no edema GI: Soft, NT, ND, +BS  Neuro: Alert and oriented. No focal deficits. Skin: No rashes, lesions or ulcers  ECG: Personally reviewed, sinus tachycardia with RBBB which is very similar to most recent tracing. No new ST-T changes (though baseline abnormalities persist)  Assessment & Plan: Sepsis due to multifocal CAP causing acute on chronic hypoxic respiratory failure. No evidence of COPD exacerbation, per se. Called by RN regarding worsening hypotension, and pt believes she's been on prednisone in the past and recently taken off of it. - Will start stress steroids and monitor BP, wean as able.  - Continue abx as ordered - Monitor cultures - WBC may be less reliable with  steroids, will check PCT in AM. - Continue home COPD medications - 4LPM O2 for now, wean as tolerated for SpO2 to maintain 88-92%.  Nitrite + bacteriuria: Asymptomatic without WBCs on micro. Doubt this is clinically significant.  - Monitor urine culture, on abx as above.  Hypokalemia:  - Replace in IVF, recheck in AM  T2DM:  - Start SSI.   HTN: Hold low dose lisinopril for now with hypotension.   HLD: Continue statin  Anxiety: Continue prn clonazepam  RLS: Continue home medications  Tyrone Nine, MD 10/26/2017, 2:14 PM

## 2017-10-26 NOTE — ED Notes (Signed)
Date and time results received: 10/25/2017  2342 (use smartphrase ".now" to insert current time)  Test: Lactic Critical Value: 3.37  Name of Provider Notified: DR Rhunette Croft  Orders Received? Or Actions Taken?: MD notified

## 2017-10-26 NOTE — Progress Notes (Signed)
Pt states she takes Doxepin at home for sleep. Doxepin not ordered for pt this admit. Merdis Delay, NP paged to make aware to see if medication could be ordered for pt. Waiting for call back/orders.

## 2017-10-27 DIAGNOSIS — J9621 Acute and chronic respiratory failure with hypoxia: Secondary | ICD-10-CM

## 2017-10-27 DIAGNOSIS — J189 Pneumonia, unspecified organism: Secondary | ICD-10-CM

## 2017-10-27 DIAGNOSIS — D72829 Elevated white blood cell count, unspecified: Secondary | ICD-10-CM

## 2017-10-27 LAB — CBC
HCT: 32.4 % — ABNORMAL LOW (ref 36.0–46.0)
HEMOGLOBIN: 9.6 g/dL — AB (ref 12.0–15.0)
MCH: 27.7 pg (ref 26.0–34.0)
MCHC: 29.6 g/dL — ABNORMAL LOW (ref 30.0–36.0)
MCV: 93.4 fL (ref 80.0–100.0)
NRBC: 0 % (ref 0.0–0.2)
PLATELETS: 175 10*3/uL (ref 150–400)
RBC: 3.47 MIL/uL — ABNORMAL LOW (ref 3.87–5.11)
RDW: 17 % — ABNORMAL HIGH (ref 11.5–15.5)
WBC: 9.7 10*3/uL (ref 4.0–10.5)

## 2017-10-27 LAB — URINE CULTURE: CULTURE: NO GROWTH

## 2017-10-27 LAB — BASIC METABOLIC PANEL
ANION GAP: 4 — AB (ref 5–15)
BUN: 13 mg/dL (ref 6–20)
CALCIUM: 8.6 mg/dL — AB (ref 8.9–10.3)
CO2: 29 mmol/L (ref 22–32)
Chloride: 107 mmol/L (ref 98–111)
Creatinine, Ser: 0.55 mg/dL (ref 0.44–1.00)
Glucose, Bld: 169 mg/dL — ABNORMAL HIGH (ref 70–99)
POTASSIUM: 3.9 mmol/L (ref 3.5–5.1)
Sodium: 140 mmol/L (ref 135–145)

## 2017-10-27 LAB — MAGNESIUM: Magnesium: 2.1 mg/dL (ref 1.7–2.4)

## 2017-10-27 LAB — GLUCOSE, CAPILLARY: GLUCOSE-CAPILLARY: 210 mg/dL — AB (ref 70–99)

## 2017-10-27 LAB — HIV ANTIBODY (ROUTINE TESTING W REFLEX): HIV Screen 4th Generation wRfx: NONREACTIVE

## 2017-10-27 LAB — MRSA PCR SCREENING: MRSA by PCR: NEGATIVE

## 2017-10-27 LAB — PROCALCITONIN: PROCALCITONIN: 3.29 ng/mL

## 2017-10-27 MED ORDER — ACETAMINOPHEN 325 MG PO TABS
650.0000 mg | ORAL_TABLET | Freq: Four times a day (QID) | ORAL | Status: DC | PRN
  Administered 2017-10-27: 650 mg via ORAL
  Filled 2017-10-27: qty 2

## 2017-10-27 NOTE — Progress Notes (Addendum)
PROGRESS NOTE  Patricia Bass  ZOX:[id number]  DOB: 03-27-1960  DOA: 10/25/2017 PCP: [name], MD  Brief Admission Hx: Patricia Bass is a 57 y.o. female with a history of 2L-dependent COPD, OSA, CAD among others who presented to the ED late last night with productive cough, weakness, and shortness of breath associated with high fever at home. She was hypoxic worse than baseline and appeared to have sepsis with lactic acid 3.37, WBC 19k, tachycardia, and mild hypotension due to multifocal pneumonia based on CXR. Flu negative. Cefepime and azithromycin were started pending culture data.    MDM/Assessment & Plan:   Sepsis due to multifocal CAP causing acute on chronic hypoxic respiratory failure. Pt seems to have responded better to the steroids.  Will continue for now.  Wean 10/25.  Pt is only minimally improved and still has high oxygen requirement, much more than baseline.  Pt not stable to discharge at this time.   - Continue abx as ordered, DC vancomycin.  Hopefully can further de-escalate antibiotics 10/25.  - Monitor cultures - Continue home COPD medications - continue supplemental oxygen, wean oxygen as tolerated for SpO2 to maintain 88-92%.  Nitrite + bacteriuria: Asymptomatic without WBCs on micro. Doubt this is clinically significant.  - Monitor urine culture, on abx as above.  Hypokalemia:  - Replaced in IV fluids, following.  T2DM:  - Start SSI.   CBG (last 3)  Recent Labs    10/26/17 0904 10/26/17 1322 10/26/17 1643  GLUCAP 109* 115* 182*    HTN: Holding lisinopril temporarily due to soft blood pressures.   HLD: Continue statin  Anxiety: Continue prn clonazepam  RLS: Continue home medications  DVT prophylaxis: lovenox Code Status: FULL  Family Communication: patient updated at bedside  Disposition Plan: Home when medically stabilized, needs further IV antibiotics.   Antimicrobials:  Cefepime 10/23-  Azithromycin 10/23-  Subjective: Pt  says she still has a lot of SOB, no cough, no wheeze, no chest pain, no fever.  Pt does have headache and asking for ibuprofen.    Objective: Vitals:   10/27/17 0759 10/27/17 0803 10/27/17 1100 10/27/17 1200  BP:    (!) 139/98  Pulse:    78  Resp:    (!) 21  Temp: 98.1 F (36.7 C)  98.4 F (36.9 C)   TempSrc: Oral  Oral   SpO2:  96%  94%  Weight:      Height:        Intake/Output Summary (Last 24 hours) at 10/27/2017 1351 Last data filed at 10/27/2017 0520 Gross per 24 hour  Intake 2489.25 ml  Output 1200 ml  Net 1289.25 ml   Filed Weights   10/25/17 2303 10/26/17 1625 10/27/17 0500  Weight: 72.6 kg 82.5 kg 82.5 kg   REVIEW OF SYSTEMS  As per history otherwise all reviewed and reported negative  Exam:  Gen: Chronically ill-appearing female resting quietly.  Pt is on high flow nasal cannula.   Pulm: diminished BS at bases. No wheezes. No rhonchi.  No rales heard.   CV: normal s1, s2 sounds, no JVD, no edema GI: Soft, NT, ND, +BS  Neuro: Alert and oriented. No focal deficits. Skin: No rashes, lesions or ulcers  Data Reviewed: Basic Metabolic Panel: Recent Labs  Lab 10/25/17 2313 10/26/17 0624 10/27/17 0422  NA 137 141 140  K 3.2* 3.2* 3.9  CL 97* 107 107  CO2 28 26 29   GLUCOSE 114* 122* 169*  BUN 16 13 13   CREATININE  0.80 0.52 0.55  CALCIUM 8.7* 8.0* 8.6*  MG  --   --  2.1   Liver Function Tests: Recent Labs  Lab 10/25/17 2313 10/26/17 0624  AST 20 21  ALT 18 18  ALKPHOS 94 76  BILITOT 0.8 0.4  PROT 7.9 6.4*  ALBUMIN 3.9 3.1*   No results for input(s): LIPASE, AMYLASE in the last 168 hours. No results for input(s): AMMONIA in the last 168 hours. CBC: Recent Labs  Lab 10/25/17 2313 10/26/17 0624 10/27/17 0422  WBC 19.5* 18.4* 9.7  NEUTROABS 17.6*  --   --   HGB 12.2 11.1* 9.6*  HCT 41.2 36.5 32.4*  MCV 90.5 91.7 93.4  PLT 208 203 175   Cardiac Enzymes: No results for input(s): CKTOTAL, CKMB, CKMBINDEX, TROPONINI in the last 168  hours. CBG (last 3)  Recent Labs    10/26/17 0904 10/26/17 1322 10/26/17 1643  GLUCAP 109* 115* 182*   Recent Results (from the past 240 hour(s))  Culture, blood (Routine x 2)     Status: None (Preliminary result)   Collection Time: 10/25/17 11:13 PM  Result Value Ref Range Status   Specimen Description BLOOD RIGHT ANTECUBITAL  Final   Special Requests   Final    BOTTLES DRAWN AEROBIC AND ANAEROBIC Blood Culture adequate volume   Culture   Final    NO GROWTH 2 DAYS Performed at [facility], [address], [place], [place] [address]    Report Status PENDING  Incomplete  Culture, blood (Routine x 2)     Status: None (Preliminary result)   Collection Time: 10/25/17 11:24 PM  Result Value Ref Range Status   Specimen Description BLOOD LEFT HAND  Final   Special Requests   Final    BOTTLES DRAWN AEROBIC AND ANAEROBIC Blood Culture adequate volume   Culture   Final    NO GROWTH 2 DAYS Performed at [facility], [address], [place], [place] [address]    Report Status PENDING  Incomplete  Culture, Urine     Status: None   Collection Time: 10/26/17 12:33 AM  Result Value Ref Range Status   Specimen Description   Final    URINE, RANDOM Performed at [facility], [address], [place], [place] [address]    Special Requests   Final    NONE Performed at [facility], [address], [place], [place] [address]    Culture   Final    NO GROWTH Performed at [facility], [address], [place], [place] [address]    Report Status 10/27/2017 FINAL  Final  MRSA PCR Screening     Status: None   Collection Time: 10/26/17  6:48 PM  Result Value Ref Range Status   MRSA by PCR NEGATIVE NEGATIVE Final    Comment:        The GeneXpert MRSA Assay (FDA approved for NASAL specimens only), is one component of a comprehensive MRSA colonization surveillance program. It is not intended to diagnose MRSA infection nor to guide or monitor treatment for MRSA infections. Performed at  [facility], [address] [place], [place] [address]      Studies: Dg Chest 2 View  Result Date: 10/25/2017 CLINICAL DATA:  Cough and fever EXAM: CHEST - 2 VIEW COMPARISON:  06/20/2016 FINDINGS: Streaky atelectasis or infiltrate at the left base. Ground-glass opacity and mild consolidation at the right middle lobe and and right base. Mild cardiomegaly. No pneumothorax. No significant pleural effusion. IMPRESSION: Ground-glass opacity and  mild consolidation at the right middle lobe with streaky airspace disease at both bases concerning for multifocal pneumonia. Electronically Signed   By: Jasmine Pang M.D.   On: 10/25/2017 23:50   Scheduled Meds: . aspirin  81 mg Oral Once per day on Mon Thu  . atorvastatin  40 mg Oral QHS  . clonazePAM  0.5-2 mg Oral TID  . enoxaparin (LOVENOX) injection  40 mg Subcutaneous Q24H  . hydrocortisone sodium succinate  50 mg Intravenous Q6H  . insulin aspart  0-9 Units Subcutaneous TID WC  . levothyroxine  75 mcg Oral QAC breakfast  . loratadine  10 mg Oral Daily  . mouth rinse  15 mL Mouth Rinse BID  . pantoprazole  40 mg Oral Daily  . pregabalin  300 mg Oral BID  . rOPINIRole  0.5 mg Oral TID  . valACYclovir  500 mg Oral BID   Continuous Infusions: . azithromycin 500 mg (10/26/17 2245)  . ceFEPime (MAXIPIME) IV 2 g (10/27/17 1015)    Active Problems:   CAP (community acquired pneumonia)  Critical Care Time spent: 32 minutes  Standley Dakins, MD, FAAFP [facility] Pager [phone]  If 7PM-7AM, please contact night-coverage [email or web address] Password [email or web address] 10/27/2017, 1:51 PM    LOS: 1 day

## 2017-10-27 NOTE — Progress Notes (Signed)
Stable patient arrived to floor.

## 2017-10-27 NOTE — Progress Notes (Signed)
Pt keeps desatting to 85% and lower- Oxygen increased to 4L and then 6L d/t low oxygen saturations.  Will continue to monitor pt

## 2017-10-27 NOTE — Care Management Note (Signed)
Case Management Note  Patient Details  Name: INDI WILLHITE MRN: [id number] Date of Birth: May 26, 1960  Subjective/Objective:   CAP. From home with significant other. Has aide services M-F for four hours a day. Has PCP- [name]. Has home oxygen- 2 liter baseline (believes it is with [employer]). Uses [employer] for mediation delivery.                  Action/Plan: DC home.   Expected Discharge Date:     10/29/2017             Expected Discharge Plan:  Home/Self Care  In-House Referral:     Discharge planning Services  CM Consult, NA  Post Acute Care Choice:    Choice offered to:  NA  DME Arranged:    DME Agency:     HH Arranged:    HH Agency:     Status of Service:  Completed, signed off  If discussed at [employer] of Stay Meetings, dates discussed:    Additional Comments:  Jun Osment, Chrystine Oiler, RN 10/27/2017, 2:57 PM

## 2017-10-28 DIAGNOSIS — A419 Sepsis, unspecified organism: Principal | ICD-10-CM

## 2017-10-28 DIAGNOSIS — Z23 Encounter for immunization: Secondary | ICD-10-CM | POA: Diagnosis not present

## 2017-10-28 LAB — MAGNESIUM: Magnesium: 2 mg/dL (ref 1.7–2.4)

## 2017-10-28 LAB — GLUCOSE, CAPILLARY
GLUCOSE-CAPILLARY: 186 mg/dL — AB (ref 70–99)
GLUCOSE-CAPILLARY: 150 mg/dL — AB (ref 70–99)
Glucose-Capillary: 174 mg/dL — ABNORMAL HIGH (ref 70–99)
Glucose-Capillary: 171 mg/dL — ABNORMAL HIGH (ref 70–99)
Glucose-Capillary: 126 mg/dL — ABNORMAL HIGH (ref 70–99)
Glucose-Capillary: 180 mg/dL — ABNORMAL HIGH (ref 70–99)

## 2017-10-28 LAB — PROCALCITONIN: Procalcitonin: 1.85 ng/mL

## 2017-10-28 LAB — BASIC METABOLIC PANEL
ANION GAP: 6 (ref 5–15)
BUN: 15 mg/dL (ref 6–20)
CO2: 30 mmol/L (ref 22–32)
Calcium: 8.3 mg/dL — ABNORMAL LOW (ref 8.9–10.3)
Chloride: 106 mmol/L (ref 98–111)
Creatinine, Ser: 0.56 mg/dL (ref 0.44–1.00)
GFR calc non Af Amer: >60 mL/min (ref >60)
Glucose, Bld: 176 mg/dL — ABNORMAL HIGH (ref 70–99)
POTASSIUM: 3.5 mmol/L (ref 3.5–5.1)
Sodium: 142 mmol/L (ref 135–145)

## 2017-10-28 MED ORDER — HYDROCORTISONE NA SUCCINATE PF 100 MG IJ SOLR: 25.0000 mg | Freq: Three times a day (TID) | INTRAMUSCULAR | Status: DC

## 2017-10-28 MED ORDER — PREDNISONE 20 MG PO TABS: ORAL_TABLET | ORAL | 0 refills | Status: DC

## 2017-10-28 MED ORDER — LEVOFLOXACIN 750 MG PO TABS: 750.0000 mg | ORAL_TABLET | Freq: Every day | ORAL | 0 refills | Status: AC

## 2017-10-28 NOTE — Care Management Important Message (Signed)
Important Message  Patient Details  Name: Patricia Bass MRN: [id number] Date of Birth: [birth year]   Medicare Important Message Given:  Yes    Renie Ora 10/28/2017, 10:15 AM

## 2017-10-28 NOTE — Discharge Summary (Signed)
Physician Discharge Summary  DELLE Bass UEA:[id number] DOB: Sep 25, 1960 DOA: 10/25/2017  PCP: [name], MD  Admit date: 10/25/2017 Discharge date: 10/28/2017  Admitted From: Home  Disposition: Home   Recommendations for Outpatient Follow-up:  1. Follow up with PCP in 2 weeks  2. Please follow up on the following pending results: final culture results  Discharge Condition: STABLE   CODE STATUS: FULL    Brief Hospitalization Summary: Please see all hospital notes, images, labs for full details of the hospitalization. HPI:   Patricia Bass  is a 57 y.o. female, with a history of diabetes mellitus, CAD, COPD on 2 L of oxygen at home, obstructive sleep apnea on oxygen at home, GERD, hypothyroidism, restless leg syndrome, chronic back pain came to hospital with cough and shortness of breath.  Patient says that she has been feeling sick for past 3 days.  She has been coughing up yellow-colored phlegm.  Had a high fever at home.  Denies chest pain.  Denies nausea vomiting or diarrhea.  Denies passing out.  No blurred vision.  No previous history of stroke or seizures.  In the ED chest x-ray showed multifocal pneumonia, patient started on cefepime and Zithromax in the ED.  Also has abnormal UA with positive nitrite.  The patient denies any dysuria. Lab work showed lactic acid 3.37, WBC 19,000.  Brief Admission Hx: Patricia Bass y.o.femalewith a history of 2L-dependent COPD, OSA, CAD among others who presented to the ED late last night with productive cough, weakness, and shortness of breath associated with high fever at home. She was hypoxic worse than baseline and appeared to have sepsis with lactic acid 3.37, WBC 19k, tachycardia, and mild hypotension due to multifocal pneumonia based on CXR. Flu negative. Cefepime and azithromycin were started empirically.    MDM/Assessment & Plan:   Sepsis due to multifocal CAP causing acute on chronic hypoxic respiratory failure. Pt  seems to have responded better to the steroids.  Discharging patient on steroid taper.  Pt is feeling much better and feeling ready to discharge home.  Pt is back down to 3L [place]. - Discharge home on levofloxacin 750 mg x 7d.  - Continue home COPD medications - continue home supplemental oxygen to maintain 88-92%.  Nitrite + bacteriuria: Asymptomatic without WBCs on micro. Doubt this is clinically significant.  - Monitor urine culture, on abx as above.  Hypokalemia: RESOLVED - Replaced in IV fluids.   T2DM:  - Treated with SSI in the hospital but resume home treatment regimen at discharge.   HTN: resuming home treatments.   HLD: Continue statin  Anxiety: Continue prn clonazepam  RLS: Continue home medications  DVT prophylaxis: lovenox Code Status: FULL  Family Communication: patient updated at bedside  Disposition Plan: Home  Antimicrobials:  Cefepime 10/23-10/25  Azithromycin 10/23-10/25  Discharge Diagnoses:  Active Problems:   CAP (community acquired pneumonia)  Discharge Instructions: Discharge Instructions    Call MD for:  difficulty breathing, headache or visual disturbances   Complete by:  As directed    Call MD for:  extreme fatigue   Complete by:  As directed    Call MD for:  persistant dizziness or light-headedness   Complete by:  As directed    Call MD for:  severe uncontrolled pain   Complete by:  As directed    Call MD for:  temperature >100.4   Complete by:  As directed    Increase activity slowly   Complete by:  As directed  Allergies as of 10/28/2017      Reactions   Duragesic Disc Transdermal System [fentanyl] Nausea And Vomiting      Medication List    STOP taking these medications   ibuprofen 600 MG tablet Commonly known as:  ADVIL,MOTRIN     TAKE these medications   albuterol 108 (90 Base) MCG/ACT inhaler Commonly known as:  PROVENTIL HFA;VENTOLIN HFA Inhale 2 puffs into the lungs every 6 (six) hours as needed for  wheezing or shortness of breath.   aspirin 81 MG chewable tablet Chew 81 mg 2 (two) times a week by mouth.   atorvastatin 40 MG tablet Commonly known as:  LIPITOR Take 40 mg by mouth at bedtime.   BIOTIN PO Take 1 tablet 3 (three) times a week by mouth.   cetirizine 10 MG tablet Commonly known as:  ZYRTEC Take 10 mg daily by mouth.   CHANTIX 1 MG tablet Generic drug:  varenicline Take 1 tablet by mouth 2 (two) times daily.   clonazePAM 1 MG tablet Commonly known as:  KLONOPIN Take 0.5-2 mg 3 (three) times daily by mouth. Takes 0.5  tablet in the morning and afternoon and  1 tablet at bedtime.   doxepin 100 MG capsule Commonly known as:  SINEQUAN Take 100 mg by mouth at bedtime.   HYDROcodone-acetaminophen 10-325 MG tablet Commonly known as:  NORCO Take 1 tablet every 4 (four) hours by mouth.   levofloxacin 750 MG tablet Commonly known as:  LEVAQUIN Take 1 tablet (750 mg total) by mouth daily for 7 days.   levothyroxine 75 MCG tablet Commonly known as:  SYNTHROID, LEVOTHROID Take 1 tablet (75 mcg total) by mouth daily before breakfast.   lidocaine 5 % ointment Commonly known as:  XYLOCAINE Apply 1 application topically 4 (four) times daily as needed for mild pain.   lisinopril 2.5 MG tablet Commonly known as:  PRINIVIL,ZESTRIL Take 2.5 mg at bedtime by mouth.   metFORMIN 500 MG tablet Commonly known as:  GLUCOPHAGE Take 1 tablet (500 mg total) by mouth 2 (two) times daily with a meal.   mometasone 50 MCG/ACT nasal spray Commonly known as:  NASONEX Place 2 sprays into the nose daily as needed (for rhinitis).   naphazoline-glycerin 0.012-0.2 % Soln Commonly known as:  CLEAR EYES REDNESS Place 1-2 drops every 4 (four) hours as needed into both eyes for eye irritation.   omega-3 acid ethyl esters 1 g capsule Commonly known as:  LOVAZA Take 2 g 2 (two) times daily by mouth.   omeprazole 40 MG capsule Commonly known as:  PRILOSEC Take 1 capsule (40 mg total)  by mouth daily. What changed:    when to take this  reasons to take this   polyethylene glycol packet Commonly known as:  MIRALAX / GLYCOLAX Take 17 g by mouth daily as needed for mild constipation.   predniSONE 20 MG tablet Commonly known as:  DELTASONE Take 3 PO QAM x3days, 2 PO QAM x3days, 1 PO QAM x3days Start taking on:  10/29/2017   pregabalin 300 MG capsule Commonly known as:  LYRICA Take 1 capsule (300 mg total) by mouth 2 (two) times daily.   rOPINIRole 0.5 MG tablet Commonly known as:  REQUIP Take 1 tablet (0.5 mg total) by mouth 3 (three) times daily.   valACYclovir 500 MG tablet Commonly known as:  VALTREX Take 500 mg by mouth 2 (two) times daily as needed.      Follow-up Information    [name], MD.  Schedule an appointment as soon as possible for a visit in 2 week(s).   Specialty:  Pulmonary Disease Why:  Hospital Follow Up  Contact information: [address] [place] [address] [phone]          Allergies  Allergen Reactions  . Duragesic Disc Transdermal System [Fentanyl] Nausea And Vomiting   Allergies as of 10/28/2017      Reactions   Duragesic Disc Transdermal System [fentanyl] Nausea And Vomiting      Medication List    STOP taking these medications   ibuprofen 600 MG tablet Commonly known as:  ADVIL,MOTRIN     TAKE these medications   albuterol 108 (90 Base) MCG/ACT inhaler Commonly known as:  PROVENTIL HFA;VENTOLIN HFA Inhale 2 puffs into the lungs every 6 (six) hours as needed for wheezing or shortness of breath.   aspirin 81 MG chewable tablet Chew 81 mg 2 (two) times a week by mouth.   atorvastatin 40 MG tablet Commonly known as:  LIPITOR Take 40 mg by mouth at bedtime.   BIOTIN PO Take 1 tablet 3 (three) times a week by mouth.   cetirizine 10 MG tablet Commonly known as:  ZYRTEC Take 10 mg daily by mouth.   CHANTIX 1 MG tablet Generic drug:  varenicline Take 1 tablet by mouth 2 (two)  times daily.   clonazePAM 1 MG tablet Commonly known as:  KLONOPIN Take 0.5-2 mg 3 (three) times daily by mouth. Takes 0.5  tablet in the morning and afternoon and  1 tablet at bedtime.   doxepin 100 MG capsule Commonly known as:  SINEQUAN Take 100 mg by mouth at bedtime.   HYDROcodone-acetaminophen 10-325 MG tablet Commonly known as:  NORCO Take 1 tablet every 4 (four) hours by mouth.   levofloxacin 750 MG tablet Commonly known as:  LEVAQUIN Take 1 tablet (750 mg total) by mouth daily for 7 days.   levothyroxine 75 MCG tablet Commonly known as:  SYNTHROID, LEVOTHROID Take 1 tablet (75 mcg total) by mouth daily before breakfast.   lidocaine 5 % ointment Commonly known as:  XYLOCAINE Apply 1 application topically 4 (four) times daily as needed for mild pain.   lisinopril 2.5 MG tablet Commonly known as:  PRINIVIL,ZESTRIL Take 2.5 mg at bedtime by mouth.   metFORMIN 500 MG tablet Commonly known as:  GLUCOPHAGE Take 1 tablet (500 mg total) by mouth 2 (two) times daily with a meal.   mometasone 50 MCG/ACT nasal spray Commonly known as:  NASONEX Place 2 sprays into the nose daily as needed (for rhinitis).   naphazoline-glycerin 0.012-0.2 % Soln Commonly known as:  CLEAR EYES REDNESS Place 1-2 drops every 4 (four) hours as needed into both eyes for eye irritation.   omega-3 acid ethyl esters 1 g capsule Commonly known as:  LOVAZA Take 2 g 2 (two) times daily by mouth.   omeprazole 40 MG capsule Commonly known as:  PRILOSEC Take 1 capsule (40 mg total) by mouth daily. What changed:    when to take this  reasons to take this   polyethylene glycol packet Commonly known as:  MIRALAX / GLYCOLAX Take 17 g by mouth daily as needed for mild constipation.   predniSONE 20 MG tablet Commonly known as:  DELTASONE Take 3 PO QAM x3days, 2 PO QAM x3days, 1 PO QAM x3days Start taking on:  10/29/2017   pregabalin 300 MG capsule Commonly known as:  LYRICA Take 1 capsule (300  mg total) by mouth  2 (two) times daily.   rOPINIRole 0.5 MG tablet Commonly known as:  REQUIP Take 1 tablet (0.5 mg total) by mouth 3 (three) times daily.   valACYclovir 500 MG tablet Commonly known as:  VALTREX Take 500 mg by mouth 2 (two) times daily as needed.      Procedures/Studies: Dg Chest 2 View  Result Date: 10/25/2017 CLINICAL DATA:  Cough and fever EXAM: CHEST - 2 VIEW COMPARISON:  06/20/2016 FINDINGS: Streaky atelectasis or infiltrate at the left base. Ground-glass opacity and mild consolidation at the right middle lobe and and right base. Mild cardiomegaly. No pneumothorax. No significant pleural effusion. IMPRESSION: Ground-glass opacity and mild consolidation at the right middle lobe with streaky airspace disease at both bases concerning for multifocal pneumonia. Electronically Signed   By: Jasmine Pang M.D.   On: 10/25/2017 23:50      Subjective: Pt reports that she is feeling better today and feels ready to go home today.  She has no SOB or chest pain.  She does not feel tired or fatigued.   Discharge Exam: Vitals:   10/27/17 2114 10/28/17 0542  BP: (!) 147/77 130/76  Pulse: 77 66  Resp: 18 19  Temp: 97.7 F (36.5 C) 98.1 F (36.7 C)  SpO2: 98% 97%   Vitals:   10/27/17 1515 10/27/17 1835 10/27/17 2114 10/28/17 0542  BP:  136/75 (!) 147/77 130/76  Pulse: 72 78 77 66  Resp:  18 18 19   Temp:  98.3 F (36.8 C) 97.7 F (36.5 C) 98.1 F (36.7 C)  TempSrc:  Oral Oral Oral  SpO2: 94% 96% 98% 97%  Weight:      Height:       Gen: pleasant female resting quietly. NAD, speaking full sentences.  Pulm: BBS fairly clear today. No wheezes. No rhonchi.  No rales heard.   RU:EAVWUJ s1, s2 sounds, no JVD, no edema WJ:XBJY, NT, ND, +BS  Neuro:Alert and oriented. No focal deficits. Skin: No rashes, lesionsorulcers   The results of significant diagnostics from this hospitalization (including imaging, microbiology, ancillary and laboratory) are listed below for  reference.     Microbiology: Recent Results (from the past 240 hour(s))  Culture, blood (Routine x 2)     Status: None (Preliminary result)   Collection Time: 10/25/17 11:13 PM  Result Value Ref Range Status   Specimen Description BLOOD RIGHT ANTECUBITAL  Final   Special Requests   Final    BOTTLES DRAWN AEROBIC AND ANAEROBIC Blood Culture adequate volume   Culture   Final    NO GROWTH 3 DAYS Performed at [facility], [address] [place], [place] [address]    Report Status PENDING  Incomplete  Culture, blood (Routine x 2)     Status: None (Preliminary result)   Collection Time: 10/25/17 11:24 PM  Result Value Ref Range Status   Specimen Description BLOOD LEFT HAND  Final   Special Requests   Final    BOTTLES DRAWN AEROBIC AND ANAEROBIC Blood Culture adequate volume   Culture   Final    NO GROWTH 3 DAYS Performed at [facility], [address], [place], [place] [address]    Report Status PENDING  Incomplete  Culture, Urine     Status: None   Collection Time: 10/26/17 12:33 AM  Result Value Ref Range Status   Specimen Description   Final    URINE, RANDOM Performed at [facility], [address], [place], [place] [address]    Special Requests  Final    NONE Performed at [facility], [address], [place], [place] [address]    Culture   Final    NO GROWTH Performed at [facility], [address] [place], [place] [address]    Report Status 10/27/2017 FINAL  Final  MRSA PCR Screening     Status: None   Collection Time: 10/26/17  6:48 PM  Result Value Ref Range Status   MRSA by PCR NEGATIVE NEGATIVE Final    Comment:        The GeneXpert MRSA Assay (FDA approved for NASAL specimens only), is one component of a comprehensive MRSA colonization surveillance program. It is not intended to diagnose MRSA infection nor to guide or monitor treatment for MRSA infections. Performed at [facility], [address], [place], [place] [address]      Labs: BNP  (last 3 results) No results for input(s): BNP in the last 8760 hours. Basic Metabolic Panel: Recent Labs  Lab 10/25/17 2313 10/26/17 0624 10/27/17 0422 10/28/17 0535  NA 137 141 140 142  K 3.2* 3.2* 3.9 3.5  CL 97* 107 107 106  CO2 28 26 29 30   GLUCOSE 114* 122* 169* 176*  BUN 16 13 13 15   CREATININE 0.80 0.52 0.55 0.56  CALCIUM 8.7* 8.0* 8.6* 8.3*  MG  --   --  2.1 2.0   Liver Function Tests: Recent Labs  Lab 10/25/17 2313 10/26/17 0624  AST 20 21  ALT 18 18  ALKPHOS 94 76  BILITOT 0.8 0.4  PROT 7.9 6.4*  ALBUMIN 3.9 3.1*   No results for input(s): LIPASE, AMYLASE in the last 168 hours. No results for input(s): AMMONIA in the last 168 hours. CBC: Recent Labs  Lab 10/25/17 2313 10/26/17 0624 10/27/17 0422  WBC 19.5* 18.4* 9.7  NEUTROABS 17.6*  --   --   HGB 12.2 11.1* 9.6*  HCT 41.2 36.5 32.4*  MCV 90.5 91.7 93.4  PLT 208 203 175   Cardiac Enzymes: No results for input(s): CKTOTAL, CKMB, CKMBINDEX, TROPONINI in the last 168 hours. BNP: Invalid input(s): POCBNP CBG: Recent Labs  Lab 10/27/17 0756 10/27/17 1138 10/27/17 1636 10/27/17 2142 10/28/17 0743  GLUCAP 174* 171* 126* 210* 150*   D-Dimer No results for input(s): DDIMER in the last 72 hours. Hgb A1c No results for input(s): HGBA1C in the last 72 hours. Lipid Profile No results for input(s): CHOL, HDL, LDLCALC, TRIG, CHOLHDL, LDLDIRECT in the last 72 hours. Thyroid function studies No results for input(s): TSH, T4TOTAL, T3FREE, THYROIDAB in the last 72 hours.  Invalid input(s): FREET3 Anemia work up No results for input(s): VITAMINB12, FOLATE, FERRITIN, TIBC, IRON, RETICCTPCT in the last 72 hours. Urinalysis    Component Value Date/Time   COLORURINE YELLOW 10/25/2017 2306   APPEARANCEUR CLEAR 10/25/2017 2306   LABSPEC <1.005 (L) 10/25/2017 2306   PHURINE 5.5 10/25/2017 2306   GLUCOSEU NEGATIVE 10/25/2017 2306   HGBUR NEGATIVE 10/25/2017 2306   BILIRUBINUR NEGATIVE 10/25/2017 2306    KETONESUR NEGATIVE 10/25/2017 2306   PROTEINUR NEGATIVE 10/25/2017 2306   NITRITE POSITIVE (A) 10/25/2017 2306   LEUKOCYTESUR SMALL (A) 10/25/2017 2306   Sepsis Labs Invalid input(s): PROCALCITONIN,  WBC,  LACTICIDVEN Microbiology Recent Results (from the past 240 hour(s))  Culture, blood (Routine x 2)     Status: None (Preliminary result)   Collection Time: 10/25/17 11:13 PM  Result Value Ref Range Status   Specimen Description BLOOD RIGHT ANTECUBITAL  Final   Special Requests  Final    BOTTLES DRAWN AEROBIC AND ANAEROBIC Blood Culture adequate volume   Culture   Final    NO GROWTH 3 DAYS Performed at [facility], [address] [place], [place] [address]    Report Status PENDING  Incomplete  Culture, blood (Routine x 2)     Status: None (Preliminary result)   Collection Time: 10/25/17 11:24 PM  Result Value Ref Range Status   Specimen Description BLOOD LEFT HAND  Final   Special Requests   Final    BOTTLES DRAWN AEROBIC AND ANAEROBIC Blood Culture adequate volume   Culture   Final    NO GROWTH 3 DAYS Performed at [facility], [address] [place], [place] [address]    Report Status PENDING  Incomplete  Culture, Urine     Status: None   Collection Time: 10/26/17 12:33 AM  Result Value Ref Range Status   Specimen Description   Final    URINE, RANDOM Performed at [facility], [address], [place], [place] [address]    Special Requests   Final    NONE Performed at [facility], [address] [place], [place] [address]    Culture   Final    NO GROWTH Performed at [facility], [address], [place], [place] [address]    Report Status 10/27/2017 FINAL  Final  MRSA PCR Screening     Status: None   Collection Time: 10/26/17  6:48 PM  Result Value Ref Range Status   MRSA by PCR NEGATIVE NEGATIVE Final    Comment:        The GeneXpert MRSA Assay (FDA approved for NASAL specimens only), is one component of a comprehensive MRSA colonization surveillance  program. It is not intended to diagnose MRSA infection nor to guide or monitor treatment for MRSA infections. Performed at [facility], [address] [place], [place] [address]    Time coordinating discharge: 32 minutes  SIGNED:  Standley Dakins, MD  [facility] 10/28/2017, 8:55 AM Pager [phone]  If 7PM-7AM, please contact night-coverage [email or web address] Password [email or web address]

## 2017-10-28 NOTE — Discharge Instructions (Signed)
Seek medical care or return to ER if symptoms come back, worsen or new problems develop.   Don't take ibuprofen while your are still taking the prednisone.     Antibiotic Medicine, Adult Antibiotic medicines treat infections caused by a type of germ called bacteria. They work by killing the bacteria that make you sick. When do I need to take antibiotics? You often need these medicines to treat bacterial infections, such as:  A urinary tract infection (UTI).  Strep throat.  Meningitis. This affects the spinal cord and brain.  A bad lung infection.  You may start the medicines while your doctor waits for tests to come back. When the tests come back, your doctor may change or stop your medicine. When are antibiotics not needed? You do not need these medicines for most common illnesses, such as:  A cold.  The flu.  A sore throat.  Antibiotics are not always needed for all infections caused by bacteria. Do not ask for these medicines, or take them, when they are not needed. What are the risks of taking antibiotics? Most antibiotics can cause an infection called Clostridium difficile.This causes watery poop (diarrhea). Let your doctor know right away if:  You have watery poop while taking an antibiotic.  You have watery poop after you stop taking an antibiotic. The illness can happen weeks after you stop the medicine.  You also have a risk of getting an infection in the future that antibiotics cannot treat (antibiotic-resistant infection). This type of infection can be dangerous. What else should I know about taking antibiotics?  You need to take the entire prescription. ? Take the medicine for as long as told by your doctor. ? Do not stop taking it even if you start to feel better.  Try not to miss any doses. If you miss a dose, call your doctor.  Birth control pills may not work. If you take birth control pills: ? Keep on taking them. ? Use a second form of birth control,  such as a condom. Do this for as long as told by your doctor.  Ask your doctor: ? How long to wait in between doses. ? If you should take the medicine with food. ? If there is anything you should stay away from while taking the antibiotic, such as: ? Food. ? Drinks. ? Medicines. ? If there are any side effects you should watch for.  Only take the medicines that your doctor told you to take. Do not take medicines that were given to someone else.  Drink a large glass of water with the medicine.  Ask the pharmacist for a tool to measure the medicine, such as: ? A syringe. ? A cup. ? A spoon.  Throw away any extra medicine. Contact a doctor if:  You get worse.  You have new joint pain or muscle aches after starting the medicine.  You have side effects from the medicine, such as: ? Stomach pain. ? Watery poop. ? Feeling sick to your stomach (nausea). Get help right away if:  You have signs of a very bad allergic reaction. If this happens, stop taking the medicine right away. Signs may include: ? Hives. These are raised, itchy, red bumps on the skin. ? Skin rash. ? Trouble breathing. ? Wheezing. ? Swelling. ? Feeling dizzy. ? Throwing up (vomiting).  Your pee (urine) is dark, or is the color of blood.  Your skin turns yellow.  You bruise easily.  You bleed easily.  You have  very bad watery poop and cramps in your belly.  You have a very bad headache. Summary  Antibiotics are often used to treat infections caused by bacteria.  Only take these medicines when needed.  Let your doctor know if you have watery poop while taking an antibiotic.  You need to take the entire prescription. This information is not intended to replace advice given to you by your health care provider. Make sure you discuss any questions you have with your health care provider. Document Released: 09/30/2007 Document Revised: 12/24/2015 Document Reviewed: 12/24/2015 Elsevier Interactive  Patient Education  2017 [employer]  Community-Acquired Pneumonia, Adult Pneumonia is an infection of the lungs. There are different types of pneumonia. One type can develop while a person is in a hospital. A different type, called community-acquired pneumonia, develops in people who are not, or have not recently been, in the hospital or other health care facility. What are the causes? Pneumonia may be caused by bacteria, viruses, or funguses. Community-acquired pneumonia is often caused by Streptococcus pneumonia bacteria. These bacteria are often passed from one person to another by breathing in droplets from the cough or sneeze of an infected person. What increases the risk? The condition is more likely to develop in:  People who havechronic diseases, such as chronic obstructive pulmonary disease (COPD), asthma, congestive heart failure, cystic fibrosis, diabetes, or kidney disease.  People who haveearly-stage or late-stage HIV.  People who havesickle cell disease.  People who havehad their spleen removed (splenectomy).  People who havepoor Administrator.  People who havemedical conditions that increase the risk of breathing in (aspirating) secretions their own mouth and nose.  People who havea weakened immune system (immunocompromised).  People who smoke.  People whotravel to areas where pneumonia-causing germs commonly exist.  People whoare around animal habitats or animals that have pneumonia-causing germs, including birds, bats, rabbits, cats, and farm animals.  What are the signs or symptoms? Symptoms of this condition include:  Adry cough.  A wet (productive) cough.  Fever.  Sweating.  Chest pain, especially when breathing deeply or coughing.  Rapid breathing or difficulty breathing.  Shortness of breath.  Shaking chills.  Fatigue.  Muscle aches.  How is this diagnosed? Your health care provider will take a medical history and perform a  physical exam. You may also have other tests, including:  Imaging studies of your chest, including X-rays.  Tests to check your blood oxygen level and other blood gases.  Other tests on blood, mucus (sputum), fluid around your lungs (pleural fluid), and urine.  If your pneumonia is severe, other tests may be done to identify the specific cause of your illness. How is this treated? The type of treatment that you receive depends on many factors, such as the cause of your pneumonia, the medicines you take, and other medical conditions that you have. For most adults, treatment and recovery from pneumonia may occur at home. In some cases, treatment must happen in a hospital. Treatment may include:  Antibiotic medicines, if the pneumonia was caused by bacteria.  Antiviral medicines, if the pneumonia was caused by a virus.  Medicines that are given by mouth or through an IV tube.  Oxygen.  Respiratory therapy.  Although rare, treating severe pneumonia may include:  Mechanical ventilation. This is done if you are not breathing well on your own and you cannot maintain a safe blood oxygen level.  Thoracentesis. This procedureremoves fluid around one lung or both lungs to help you breathe better.  Follow these instructions at home:  Take over-the-counter and prescription medicines only as told by your health care provider. ? Only takecough medicine if you are losing sleep. Understand that cough medicine can prevent your bodys natural ability to remove mucus from your lungs. ? If you were prescribed an antibiotic medicine, take it as told by your health care provider. Do not stop taking the antibiotic even if you start to feel better.  Sleep in a semi-upright position at night. Try sleeping in a reclining chair, or place a few pillows under your head.  Do not use tobacco products, including cigarettes, chewing tobacco, and e-cigarettes. If you need help quitting, ask your health care  provider.  Drink enough water to keep your urine clear or pale yellow. This will help to thin out mucus secretions in your lungs. How is this prevented? There are ways that you can decrease your risk of developing community-acquired pneumonia. Consider getting a pneumococcal vaccine if:  You are older than 58 years of age.  You are older than 58 years of age and are undergoing cancer treatment, have chronic lung disease, or have other medical conditions that affect your immune system. Ask your health care provider if this applies to you.  There are different types and schedules of pneumococcal vaccines. Ask your health care provider which vaccination option is best for you. You may also prevent community-acquired pneumonia if you take these actions:  Get an influenza vaccine every year. Ask your health care provider which type of influenza vaccine is best for you.  Go to the dentist on a regular basis.  Wash your hands often. Use hand sanitizer if soap and water are not available.  Contact a health care provider if:  You have a fever.  You are losing sleep because you cannot control your cough with cough medicine. Get help right away if:  You have worsening shortness of breath.  You have increased chest pain.  Your sickness becomes worse, especially if you are an older adult or have a weakened immune system.  You cough up blood. This information is not intended to replace advice given to you by your health care provider. Make sure you discuss any questions you have with your health care provider. Document Released: 12/21/2004 Document Revised: 05/01/2015 Document Reviewed: 04/17/2014 Elsevier Interactive Patient Education  [employer].

## 2017-10-28 NOTE — Progress Notes (Signed)
Removed IV-clean, dry, intact.Reviewed d/c paperwork with patient. Reviewed new medications. Answered all questions. Wheeled stable patient and belongings to main entrance where she was picked up by cousin.

## 2017-10-30 LAB — CULTURE, BLOOD (ROUTINE X 2)
Culture: NO GROWTH
Culture: NO GROWTH
Special Requests: ADEQUATE
Special Requests: ADEQUATE

## 2017-11-02 DIAGNOSIS — Z79891 Long term (current) use of opiate analgesic: Secondary | ICD-10-CM | POA: Diagnosis not present

## 2017-11-11 ENCOUNTER — Emergency Department (HOSPITAL_COMMUNITY): Payer: Medicare Other

## 2017-11-11 ENCOUNTER — Inpatient Hospital Stay (HOSPITAL_COMMUNITY)
Admission: EM | Admit: 2017-11-11 | Discharge: 2017-11-14 | DRG: 190 | Disposition: A | Discharge status: Home / Self Care | Payer: Medicare Other | Attending: Pulmonary Disease | Admitting: Pulmonary Disease

## 2017-11-11 ENCOUNTER — Encounter (HOSPITAL_COMMUNITY): Payer: Self-pay

## 2017-11-11 ENCOUNTER — Other Ambulatory Visit: Payer: Self-pay

## 2017-11-11 DIAGNOSIS — Z888 Allergy status to other drugs, medicaments and biological substances status: Secondary | ICD-10-CM

## 2017-11-11 DIAGNOSIS — G2581 Restless legs syndrome: Secondary | ICD-10-CM | POA: Diagnosis present

## 2017-11-11 DIAGNOSIS — I959 Hypotension, unspecified: Secondary | ICD-10-CM | POA: Diagnosis present

## 2017-11-11 DIAGNOSIS — E039 Hypothyroidism, unspecified: Secondary | ICD-10-CM | POA: Diagnosis not present

## 2017-11-11 DIAGNOSIS — K219 Gastro-esophageal reflux disease without esophagitis: Secondary | ICD-10-CM | POA: Diagnosis present

## 2017-11-11 DIAGNOSIS — R0902 Hypoxemia: Secondary | ICD-10-CM | POA: Diagnosis not present

## 2017-11-11 DIAGNOSIS — J181 Lobar pneumonia, unspecified organism: Secondary | ICD-10-CM | POA: Diagnosis not present

## 2017-11-11 DIAGNOSIS — E1142 Type 2 diabetes mellitus with diabetic polyneuropathy: Secondary | ICD-10-CM | POA: Diagnosis present

## 2017-11-11 DIAGNOSIS — J9601 Acute respiratory failure with hypoxia: Secondary | ICD-10-CM | POA: Diagnosis not present

## 2017-11-11 DIAGNOSIS — Z79899 Other long term (current) drug therapy: Secondary | ICD-10-CM | POA: Diagnosis not present

## 2017-11-11 DIAGNOSIS — Z9981 Dependence on supplemental oxygen: Secondary | ICD-10-CM | POA: Diagnosis not present

## 2017-11-11 DIAGNOSIS — Z79890 Hormone replacement therapy (postmenopausal): Secondary | ICD-10-CM | POA: Diagnosis not present

## 2017-11-11 DIAGNOSIS — F17210 Nicotine dependence, cigarettes, uncomplicated: Secondary | ICD-10-CM | POA: Diagnosis present

## 2017-11-11 DIAGNOSIS — J441 Chronic obstructive pulmonary disease with (acute) exacerbation: Secondary | ICD-10-CM | POA: Diagnosis not present

## 2017-11-11 DIAGNOSIS — J189 Pneumonia, unspecified organism: Secondary | ICD-10-CM | POA: Diagnosis present

## 2017-11-11 DIAGNOSIS — G8929 Other chronic pain: Secondary | ICD-10-CM | POA: Diagnosis present

## 2017-11-11 DIAGNOSIS — Z8701 Personal history of pneumonia (recurrent): Secondary | ICD-10-CM

## 2017-11-11 DIAGNOSIS — G5600 Carpal tunnel syndrome, unspecified upper limb: Secondary | ICD-10-CM | POA: Diagnosis present

## 2017-11-11 DIAGNOSIS — E139 Other specified diabetes mellitus without complications: Secondary | ICD-10-CM | POA: Diagnosis not present

## 2017-11-11 DIAGNOSIS — Z7984 Long term (current) use of oral hypoglycemic drugs: Secondary | ICD-10-CM | POA: Diagnosis not present

## 2017-11-11 DIAGNOSIS — E119 Type 2 diabetes mellitus without complications: Secondary | ICD-10-CM

## 2017-11-11 DIAGNOSIS — R000 Tachycardia, unspecified: Secondary | ICD-10-CM | POA: Diagnosis not present

## 2017-11-11 DIAGNOSIS — G4730 Sleep apnea, unspecified: Secondary | ICD-10-CM | POA: Diagnosis not present

## 2017-11-11 DIAGNOSIS — E876 Hypokalemia: Secondary | ICD-10-CM | POA: Diagnosis present

## 2017-11-11 DIAGNOSIS — F419 Anxiety disorder, unspecified: Secondary | ICD-10-CM | POA: Diagnosis present

## 2017-11-11 DIAGNOSIS — I2510 Atherosclerotic heart disease of native coronary artery without angina pectoris: Secondary | ICD-10-CM | POA: Diagnosis not present

## 2017-11-11 DIAGNOSIS — Z7982 Long term (current) use of aspirin: Secondary | ICD-10-CM

## 2017-11-11 DIAGNOSIS — R05 Cough: Secondary | ICD-10-CM | POA: Diagnosis not present

## 2017-11-11 DIAGNOSIS — M1990 Unspecified osteoarthritis, unspecified site: Secondary | ICD-10-CM | POA: Diagnosis present

## 2017-11-11 DIAGNOSIS — J440 Chronic obstructive pulmonary disease with acute lower respiratory infection: Principal | ICD-10-CM | POA: Diagnosis present

## 2017-11-11 DIAGNOSIS — E1165 Type 2 diabetes mellitus with hyperglycemia: Secondary | ICD-10-CM

## 2017-11-11 DIAGNOSIS — R0602 Shortness of breath: Secondary | ICD-10-CM | POA: Diagnosis not present

## 2017-11-11 LAB — CBC WITH DIFFERENTIAL/PLATELET
ABS IMMATURE GRANULOCYTES: 0.04 10*3/uL (ref 0.00–0.07)
BASOS ABS: 0 10*3/uL (ref 0.0–0.1)
Basophils Relative: 0 %
Eosinophils Absolute: 0 10*3/uL (ref 0.0–0.5)
Eosinophils Relative: 0 %
HEMATOCRIT: 38 % (ref 36.0–46.0)
HEMOGLOBIN: 11.3 g/dL — AB (ref 12.0–15.0)
IMMATURE GRANULOCYTES: 0 %
LYMPHS ABS: 2.4 10*3/uL (ref 0.7–4.0)
LYMPHS PCT: 22 %
MCH: 27.7 pg (ref 26.0–34.0)
MCHC: 29.7 g/dL — ABNORMAL LOW (ref 30.0–36.0)
MCV: 93.1 fL (ref 80.0–100.0)
MONOS PCT: 5 %
Monocytes Absolute: 0.5 10*3/uL (ref 0.1–1.0)
NEUTROS ABS: 8 10*3/uL — AB (ref 1.7–7.7)
NEUTROS PCT: 73 %
NRBC: 0 % (ref 0.0–0.2)
Platelets: 205 10*3/uL (ref 150–400)
RBC: 4.08 MIL/uL (ref 3.87–5.11)
RDW: 16.8 % — AB (ref 11.5–15.5)
WBC: 11 10*3/uL — ABNORMAL HIGH (ref 4.0–10.5)

## 2017-11-11 LAB — COMPREHENSIVE METABOLIC PANEL
ALBUMIN: 3.5 g/dL (ref 3.5–5.0)
ALK PHOS: 93 U/L (ref 38–126)
ALT: 35 U/L (ref 0–44)
ANION GAP: 11 (ref 5–15)
AST: 43 U/L — ABNORMAL HIGH (ref 15–41)
BUN: 10 mg/dL (ref 6–20)
CALCIUM: 8.6 mg/dL — AB (ref 8.9–10.3)
CHLORIDE: 97 mmol/L — AB (ref 98–111)
CO2: 31 mmol/L (ref 22–32)
Creatinine, Ser: 0.65 mg/dL (ref 0.44–1.00)
GFR calc non Af Amer: >60 mL/min (ref >60)
GLUCOSE: 154 mg/dL — AB (ref 70–99)
POTASSIUM: 2.8 mmol/L — AB (ref 3.5–5.1)
SODIUM: 139 mmol/L (ref 135–145)
Total Bilirubin: 0.7 mg/dL (ref 0.3–1.2)
Total Protein: 7.4 g/dL (ref 6.5–8.1)

## 2017-11-11 LAB — GLUCOSE, CAPILLARY: Glucose-Capillary: 126 mg/dL — ABNORMAL HIGH (ref 70–99)

## 2017-11-11 LAB — INFLUENZA PANEL BY PCR (TYPE A & B)
Influenza A By PCR: NEGATIVE
Influenza B By PCR: NEGATIVE

## 2017-11-11 LAB — BRAIN NATRIURETIC PEPTIDE: B Natriuretic Peptide: 88 pg/mL (ref 0.0–100.0)

## 2017-11-11 LAB — TROPONIN I: Troponin I: 0.04 ng/mL (ref <0.03)

## 2017-11-11 MED ORDER — VANCOMYCIN HCL 500 MG IV SOLR
INTRAVENOUS | Status: AC
  Filled 2017-11-11: qty 500

## 2017-11-11 MED ORDER — HYDROCODONE-ACETAMINOPHEN 10-325 MG PO TABS
1.0000 | ORAL_TABLET | ORAL | Status: DC
  Administered 2017-11-11 – 2017-11-12 (×3): 1 via ORAL
  Filled 2017-11-11 (×3): qty 1

## 2017-11-11 MED ORDER — INSULIN ASPART 100 UNIT/ML ~~LOC~~ SOLN
0.0000 [IU] | Freq: Three times a day (TID) | SUBCUTANEOUS | Status: DC
  Administered 2017-11-12: 2 [IU] via SUBCUTANEOUS
  Administered 2017-11-12 – 2017-11-13 (×2): 3 [IU] via SUBCUTANEOUS
  Administered 2017-11-13: 2 [IU] via SUBCUTANEOUS

## 2017-11-11 MED ORDER — TRAMADOL HCL 50 MG PO TABS
ORAL_TABLET | ORAL | Status: AC
  Filled 2017-11-11: qty 1

## 2017-11-11 MED ORDER — ALBUTEROL SULFATE (2.5 MG/3ML) 0.083% IN NEBU: 3.0000 mL | INHALATION_SOLUTION | Freq: Four times a day (QID) | RESPIRATORY_TRACT | Status: DC | PRN

## 2017-11-11 MED ORDER — CLONAZEPAM 0.5 MG PO TABS: 0.5000 mg | ORAL_TABLET | Freq: Three times a day (TID) | ORAL | Status: DC

## 2017-11-11 MED ORDER — POTASSIUM CHLORIDE CRYS ER 20 MEQ PO TBCR
40.0000 meq | EXTENDED_RELEASE_TABLET | Freq: Two times a day (BID) | ORAL | Status: DC
  Administered 2017-11-11 – 2017-11-14 (×7): 40 meq via ORAL
  Filled 2017-11-11 (×4): qty 2
  Filled 2017-11-11: qty 4
  Filled 2017-11-11: qty 2

## 2017-11-11 MED ORDER — LIDOCAINE 5 % EX OINT
1.0000 "application " | TOPICAL_OINTMENT | Freq: Four times a day (QID) | CUTANEOUS | Status: DC | PRN
  Filled 2017-11-11: qty 35.44

## 2017-11-11 MED ORDER — ACETAMINOPHEN 500 MG PO TABS: 1000.0000 mg | ORAL_TABLET | Freq: Once | ORAL | Status: DC

## 2017-11-11 MED ORDER — ENOXAPARIN SODIUM 40 MG/0.4ML ~~LOC~~ SOLN
40.0000 mg | SUBCUTANEOUS | Status: DC
  Administered 2017-11-11 – 2017-11-13 (×3): 40 mg via SUBCUTANEOUS
  Filled 2017-11-11 (×3): qty 0.4

## 2017-11-11 MED ORDER — LORATADINE 10 MG PO TABS
10.0000 mg | ORAL_TABLET | Freq: Every day | ORAL | Status: DC
  Administered 2017-11-12 – 2017-11-14 (×3): 10 mg via ORAL
  Filled 2017-11-11 (×3): qty 1

## 2017-11-11 MED ORDER — METFORMIN HCL 500 MG PO TABS
500.0000 mg | ORAL_TABLET | Freq: Two times a day (BID) | ORAL | Status: DC
  Filled 2017-11-11: qty 1

## 2017-11-11 MED ORDER — SODIUM CHLORIDE 0.9 % IV SOLN
INTRAVENOUS | Status: DC | PRN
  Administered 2017-11-11: 500 mL via INTRAVENOUS

## 2017-11-11 MED ORDER — DOXEPIN HCL 25 MG PO CAPS
100.0000 mg | ORAL_CAPSULE | Freq: Every day | ORAL | Status: DC
  Administered 2017-11-12 – 2017-11-13 (×3): 100 mg via ORAL
  Filled 2017-11-11 (×3): qty 4

## 2017-11-11 MED ORDER — VARENICLINE TARTRATE 1 MG PO TABS
1.0000 mg | ORAL_TABLET | Freq: Two times a day (BID) | ORAL | Status: DC
  Administered 2017-11-12 – 2017-11-14 (×4): 1 mg via ORAL
  Filled 2017-11-11 (×10): qty 1

## 2017-11-11 MED ORDER — FLUTICASONE PROPIONATE 50 MCG/ACT NA SUSP
1.0000 | Freq: Every day | NASAL | Status: DC
  Administered 2017-11-12 – 2017-11-14 (×3): 1 via NASAL
  Filled 2017-11-11: qty 16

## 2017-11-11 MED ORDER — ASPIRIN 81 MG PO CHEW
81.0000 mg | CHEWABLE_TABLET | ORAL | Status: DC
  Administered 2017-11-14: 81 mg via ORAL
  Filled 2017-11-11: qty 1

## 2017-11-11 MED ORDER — TRAMADOL HCL 50 MG PO TABS
50.0000 mg | ORAL_TABLET | Freq: Once | ORAL | Status: AC
  Administered 2017-11-11: 50 mg via ORAL

## 2017-11-11 MED ORDER — POTASSIUM CHLORIDE CRYS ER 20 MEQ PO TBCR
EXTENDED_RELEASE_TABLET | ORAL | Status: AC
  Administered 2017-11-11: 40 meq via ORAL
  Filled 2017-11-11: qty 2

## 2017-11-11 MED ORDER — ROPINIROLE HCL 0.25 MG PO TABS
0.5000 mg | ORAL_TABLET | Freq: Three times a day (TID) | ORAL | Status: DC
  Administered 2017-11-12 – 2017-11-14 (×8): 0.5 mg via ORAL
  Filled 2017-11-11 (×8): qty 2

## 2017-11-11 MED ORDER — SODIUM CHLORIDE 0.9 % IV SOLN
1.0000 g | Freq: Three times a day (TID) | INTRAVENOUS | Status: DC
  Administered 2017-11-11 – 2017-11-14 (×8): 1 g via INTRAVENOUS
  Filled 2017-11-11 (×14): qty 1

## 2017-11-11 MED ORDER — PANTOPRAZOLE SODIUM 40 MG PO TBEC
40.0000 mg | DELAYED_RELEASE_TABLET | Freq: Every day | ORAL | Status: DC
  Administered 2017-11-12 – 2017-11-14 (×3): 40 mg via ORAL
  Filled 2017-11-11 (×3): qty 1

## 2017-11-11 MED ORDER — VANCOMYCIN HCL IN DEXTROSE 750-5 MG/150ML-% IV SOLN
750.0000 mg | Freq: Two times a day (BID) | INTRAVENOUS | Status: DC
  Filled 2017-11-11 (×5): qty 150

## 2017-11-11 MED ORDER — LEVOTHYROXINE SODIUM 75 MCG PO TABS
75.0000 ug | ORAL_TABLET | Freq: Every day | ORAL | Status: DC
  Administered 2017-11-12 – 2017-11-14 (×3): 75 ug via ORAL
  Filled 2017-11-11 (×3): qty 1

## 2017-11-11 MED ORDER — CLONAZEPAM 0.5 MG PO TABS: 0.5000 mg | ORAL_TABLET | Freq: Two times a day (BID) | ORAL | Status: DC

## 2017-11-11 MED ORDER — CLONAZEPAM 0.5 MG PO TABS
1.0000 mg | ORAL_TABLET | Freq: Every day | ORAL | Status: DC
  Administered 2017-11-12: 1 mg via ORAL
  Filled 2017-11-11: qty 2

## 2017-11-11 MED ORDER — VANCOMYCIN HCL 1000 MG IV SOLR
INTRAVENOUS | Status: AC
  Filled 2017-11-11: qty 1000

## 2017-11-11 MED ORDER — LISINOPRIL 5 MG PO TABS: 2.5000 mg | ORAL_TABLET | Freq: Every day | ORAL | Status: DC

## 2017-11-11 MED ORDER — PREGABALIN 75 MG PO CAPS
300.0000 mg | ORAL_CAPSULE | Freq: Two times a day (BID) | ORAL | Status: DC
  Administered 2017-11-12 (×2): 300 mg via ORAL
  Filled 2017-11-11 (×2): qty 4

## 2017-11-11 MED ORDER — VANCOMYCIN HCL 10 G IV SOLR
1500.0000 mg | Freq: Once | INTRAVENOUS | Status: AC
  Administered 2017-11-11: 1500 mg via INTRAVENOUS
  Filled 2017-11-11: qty 1500

## 2017-11-11 MED ORDER — ACETAMINOPHEN 500 MG PO TABS
1000.0000 mg | ORAL_TABLET | Freq: Once | ORAL | Status: AC
  Administered 2017-11-11: 1000 mg via ORAL
  Filled 2017-11-11: qty 2

## 2017-11-11 MED ORDER — ATORVASTATIN CALCIUM 40 MG PO TABS
40.0000 mg | ORAL_TABLET | ORAL | Status: DC
  Administered 2017-11-12 – 2017-11-14 (×3): 40 mg via ORAL
  Filled 2017-11-11 (×3): qty 1

## 2017-11-11 MED ORDER — IBUPROFEN 800 MG PO TABS
800.0000 mg | ORAL_TABLET | Freq: Three times a day (TID) | ORAL | Status: DC
  Administered 2017-11-12 – 2017-11-14 (×8): 800 mg via ORAL
  Filled 2017-11-11 (×8): qty 1

## 2017-11-11 MED ORDER — INSULIN ASPART 100 UNIT/ML ~~LOC~~ SOLN: 0.0000 [IU] | Freq: Every day | SUBCUTANEOUS | Status: DC

## 2017-11-11 NOTE — H&P (Signed)
History and Physical  Patricia Bass:[id number] DOB: 12-29-1960 DOA: 11/11/2017  Referring physician: Dr Jeraldine Loots, ED physician PCP: [name], MD  Outpatient Specialists:  Patient Coming From: home  Chief Complaint: Cough, fever, shortness of breath  HPI: Patricia Bass is a 57 y.o. female with a history of COPD, anxiety, diabetes, GERD, sleep apnea with home O2 use at night.  Patient recently hospitalized 2 weeks ago for pneumonia for 2 days.  She was released and sent home on oral antibiotics and prednisone.  She finished these 2 days ago.  This morning, the patient began to feel awful with body aches and fevers.  As the day progressed, the patient's symptoms continue to increase with purulent sputum.  Patient brought to the hospital for evaluation.  No palliating or provoking factors.  Symptoms are worsening.  Denies sick contacts.  Patient still smokes.  Emergency Department Course: Checks x-ray shows continued pneumonia in the right middle lobe.  White count 11.  Patient started on cefepime and Vanco.  Flu swab is still pending.  Review of Systems:   Pt complains of right hip pain and leg pain.  Pt denies any fevers, chills, nausea, vomiting, diarrhea, constipation, abdominal pain, palpitations, headache, vision changes, lightheadedness, dizziness, melena, rectal bleeding.  Review of systems are otherwise negative  Past Medical History:  Diagnosis Date  . Anemia   . Anxiety   . Arthritis   . Bulging disc   . Carpal tunnel syndrome   . Cervicalgia   . Chronic airway obstruction (HCC)   . Chronic back pain   . COPD (chronic obstructive pulmonary disease) (HCC)   . Coronary artery disease   . Diabetes mellitus (HCC) 01/11/2012  . GERD (gastroesophageal reflux disease)   . Headache(784.0)   . Hypothyroid   . Lumbago   . Migraine   . Myalgia and myositis, unspecified   . Neuropathy   . Pneumonia   . Restless leg syndrome   . Shortness of breath   . Sleep apnea      uses O2 at home. uses it at home all times and at night at 2 liters.   Past Surgical History:  Procedure Laterality Date  . BIOPSY  12/06/2016   Procedure: BIOPSY;  Surgeon: Corbin Ade, MD;  Location: [facility];  Service: Endoscopy;;  gastric   . BLADDER SUSPENSION    . CARDIAC CATHETERIZATION    . CHOLECYSTECTOMY    . COLONOSCOPY WITH PROPOFOL N/A 12/06/2016   Procedure: COLONOSCOPY WITH PROPOFOL  Formed stool could not do;  Surgeon: Corbin Ade, MD;  Location: [facility];  Service: Endoscopy;  Laterality: N/A;  10:00am  . ESOPHAGOGASTRODUODENOSCOPY (EGD) WITH PROPOFOL N/A 12/06/2016   Procedure: ESOPHAGOGASTRODUODENOSCOPY (EGD) WITH PROPOFOL;  Surgeon: Corbin Ade, MD;  Location: [facility];  Service: Endoscopy;  Laterality: N/A;  . Elease Hashimoto DILATION N/A 12/06/2016   Procedure: Elease Hashimoto DILATION;  Surgeon: Corbin Ade, MD;  Location: [facility];  Service: Endoscopy;  Laterality: N/A;  . TUBAL LIGATION    . ULNAR NERVE TRANSPOSITION  04/01/2011   Procedure: ULNAR NERVE DECOMPRESSION/TRANSPOSITION;  Surgeon: Dominica Severin, MD;  Location: [facility] OR;  Service: Orthopedics;  Laterality: Left;  ULNAR NERVE RELEASE WITH ANTERIOR TRANS POSITION REPAIR RECONSTRUCTION AS NECESSARY   Social History:  reports that she has been smoking cigarettes and e-cigarettes. She has a 10.00 pack-year smoking history. She has never used smokeless tobacco. She reports that she has current or past drug history.  Drugs: Hydrocodone and Marijuana. She reports that she does not drink alcohol. Patient lives at home  Allergies  Allergen Reactions  . Duragesic Disc Transdermal System [Fentanyl] Nausea And Vomiting    Family History  Problem Relation Age of Onset  . Colon cancer Neg Hx   . Celiac disease Neg Hx       Prior to Admission medications   Medication Sig Start Date End Date Taking? Authorizing Provider  albuterol (PROVENTIL HFA;VENTOLIN HFA) 108 (90 BASE) MCG/ACT inhaler Inhale 2  puffs into the lungs every 6 (six) hours as needed for wheezing or shortness of breath.     [provider]  aspirin 81 MG chewable tablet Chew 81 mg 2 (two) times a week by mouth.     [provider]  atorvastatin (LIPITOR) 40 MG tablet Take 40 mg by mouth at bedtime.    [provider]  BIOTIN PO Take 1 tablet 3 (three) times a week by mouth.    [provider]  cetirizine (ZYRTEC) 10 MG tablet Take 10 mg daily by mouth.     [name], MD  CHANTIX 1 MG tablet Take 1 tablet by mouth 2 (two) times daily. 10/19/16   [provider]  clonazePAM (KLONOPIN) 1 MG tablet Take 0.5-2 mg 3 (three) times daily by mouth. Takes 0.5  tablet in the morning and afternoon and  1 tablet at bedtime.    [provider]  doxepin (SINEQUAN) 100 MG capsule Take 100 mg by mouth at bedtime.    [provider]  HYDROcodone-acetaminophen (NORCO) 10-325 MG per tablet Take 1 tablet every 4 (four) hours by mouth.     [provider]  levothyroxine (SYNTHROID, LEVOTHROID) 75 MCG tablet Take 1 tablet (75 mcg total) by mouth daily before breakfast. 09/05/14   [name], MD  lidocaine (XYLOCAINE) 5 % ointment Apply 1 application topically 4 (four) times daily as needed for mild pain.    [provider]  lisinopril (PRINIVIL,ZESTRIL) 2.5 MG tablet Take 2.5 mg at bedtime by mouth.     [provider]  metFORMIN (GLUCOPHAGE) 500 MG tablet Take 1 tablet (500 mg total) by mouth 2 (two) times daily with a meal. 09/05/14   [name], MD  mometasone (NASONEX) 50 MCG/ACT nasal spray Place 2 sprays into the nose daily as needed (for rhinitis).    [provider]  naphazoline-glycerin (CLEAR EYES) 0.012-0.2 % SOLN Place 1-2 drops every 4 (four) hours as needed into both eyes for eye irritation.     [provider]  omega-3 acid ethyl esters (LOVAZA) 1 g capsule Take 2 g 2 (two) times daily by mouth.     [provider]  omeprazole (PRILOSEC) 40 MG capsule Take 1 capsule (40 mg total) by mouth daily. Patient taking differently: Take 40 mg by mouth daily as needed (for acid reflux).  09/05/14   [name], MD  polyethylene glycol [facility]) packet Take 17 g by mouth daily as needed for mild constipation.    [provider]  predniSONE (DELTASONE) 20 MG tablet Take 3 PO QAM x3days, 2 PO QAM x3days, 1 PO QAM x3days 10/29/17   Johnson, Clanford L, MD  pregabalin (LYRICA) 300 MG capsule Take 1 capsule (300 mg total) by mouth 2 (two) times daily. 09/05/14   [name], MD  rOPINIRole (REQUIP) 0.5 MG tablet Take 1 tablet (0.5 mg total) by mouth 3 (three) times daily. 09/05/14   [name], MD  valACYclovir (VALTREX) 500 MG tablet Take 500 mg by mouth 2 (two) times daily as needed.     [name], MD    Physical Exam: BP 132/85   Pulse (!) 117   Temp (!) 103.1 F (39.5 C) (Oral)   Resp 15   Ht 5\' 1"  (1.549 m)   Wt 81.6 kg   SpO2 91%   BMI 34.01 kg/m   . General: Middle-aged Caucasian female. Awake and alert and oriented x3. No acute cardiopulmonary distress.  [name] HEENT: Normocephalic atraumatic.  Right and left ears normal in appearance.  Pupils equal, round, reactive to light. Extraocular muscles are intact. Sclerae anicteric and noninjected.  Moist mucosal membranes. No mucosal lesions.  . Neck: Neck supple without lymphadenopathy. No carotid bruits. No masses palpated.  . Cardiovascular: Regular rate with normal S1-S2 sounds. No murmurs, rubs, gallops auscultated. No JVD.  [name] Respiratory: Rales and middle lobe on the right side.  No wheezing.  No accessory muscle use. . Abdomen: Soft, nontender, nondistended. Active bowel sounds. No masses or hepatosplenomegaly  . Skin: No rashes, lesions, or ulcerations.  Dry, warm to touch. 2+ dorsalis pedis and radial pulses. . Musculoskeletal: No calf or leg pain. All major joints not erythematous nontender.  No upper or  lower joint deformation.  Good ROM.  No contractures  . Psychiatric: Intact judgment and insight. Pleasant and cooperative. . Neurologic: No focal neurological deficits. Strength is 5/5 and symmetric in upper and lower extremities.  Cranial nerves II through XII are grossly intact.           Labs on Admission: I have personally reviewed following labs and imaging studies  CBC: Recent Labs  Lab 11/11/17 2000  WBC 11.0*  NEUTROABS 8.0*  HGB 11.3*  HCT 38.0  MCV 93.1  PLT 205   Basic Metabolic Panel: Recent Labs  Lab 11/11/17 2000  NA 139  K 2.8*  CL 97*  CO2 31  GLUCOSE 154*  BUN 10  CREATININE 0.65  CALCIUM 8.6*   GFR: Estimated Creatinine Clearance: 74.2 mL/min (by C-G formula based on SCr of 0.65 mg/dL). Liver Function Tests: Recent Labs  Lab 11/11/17 2000  AST 43*  ALT 35  ALKPHOS 93  BILITOT 0.7  PROT 7.4  ALBUMIN 3.5   No results for input(s): LIPASE, AMYLASE in the last 168 hours. No results for input(s): AMMONIA in the last 168 hours. Coagulation Profile: No results for input(s): INR, PROTIME in the last 168 hours. Cardiac Enzymes: Recent Labs  Lab 11/11/17 2000  TROPONINI 0.04*   BNP (last 3 results) No results for input(s): PROBNP in the last 8760 hours. HbA1C: No results for input(s): HGBA1C in the last 72 hours. CBG: No results for input(s): GLUCAP in the last 168 hours. Lipid Profile: No results for input(s): CHOL, HDL, LDLCALC, TRIG, CHOLHDL, LDLDIRECT in the last 72 hours. Thyroid Function Tests: No results for input(s): TSH, T4TOTAL, FREET4, T3FREE, THYROIDAB in the last 72 hours. Anemia Panel: No results for input(s): VITAMINB12, FOLATE, FERRITIN, TIBC, IRON, RETICCTPCT in the last 72 hours. Urine analysis:    Component Value Date/Time   COLORURINE YELLOW 10/25/2017 2306   APPEARANCEUR CLEAR 10/25/2017 2306   LABSPEC <1.005 (L) 10/25/2017 2306   PHURINE 5.5 10/25/2017 2306   GLUCOSEU NEGATIVE 10/25/2017 2306   HGBUR NEGATIVE  10/25/2017 2306   BILIRUBINUR NEGATIVE 10/25/2017 2306   KETONESUR NEGATIVE 10/25/2017 2306   PROTEINUR NEGATIVE 10/25/2017 2306   NITRITE POSITIVE (A) 10/25/2017 2306   LEUKOCYTESUR SMALL (  A) 10/25/2017 2306   Sepsis Labs: @LABRCNTIP (procalcitonin:4,lacticidven:4) ) Recent Results (from the past 240 hour(s))  Culture, blood (routine x 2) Call MD if unable to obtain prior to antibiotics being given     Status: None (Preliminary result)   Collection Time: 11/11/17  8:45 PM  Result Value Ref Range Status   Specimen Description   Final    BLOOD BLOOD RIGHT HAND BOTTLES DRAWN AEROBIC AND ANAEROBIC   Special Requests   Final    Blood Culture adequate volume Performed at [facility], [address] [place], [place] [address]    Culture PENDING  Incomplete   Report Status PENDING  Incomplete  Culture, blood (routine x 2) Call MD if unable to obtain prior to antibiotics being given     Status: None (Preliminary result)   Collection Time: 11/11/17  8:48 PM  Result Value Ref Range Status   Specimen Description   Final    BLOOD LEFT ANTECUBITAL BOTTLES DRAWN AEROBIC AND ANAEROBIC   Special Requests   Final    Blood Culture results may not be optimal due to an excessive volume of blood received in culture bottles Performed at [facility], [address] [place], [place] [address]    Culture PENDING  Incomplete   Report Status PENDING  Incomplete     Radiological Exams on Admission: Dg Chest 2 View  Result Date: 11/11/2017 CLINICAL DATA:  57 y/o F; recent diagnosis of pneumonia. Productive cough, shortness of breath. EXAM: CHEST - 2 VIEW COMPARISON:  10/25/2017 chest radiograph. FINDINGS: Stable normal cardiac silhouette given projection and technique. Decrease in right middle lobe consolidation. Clear lungs. No pleural effusion or pneumothorax. No acute osseous abnormality is evident. IMPRESSION: Consolidation in the right middle lobe compatible with recurrent/residual pneumonia,  decreased from prior chest radiographs. Electronically Signed   By: Mitzi Hansen M.D.   On: 11/11/2017 20:13    EKG: Independently reviewed.  Sinus rhythm with right bundle branch block.  No acute ST changes.  Assessment/Plan: Principal Problem:   Acute respiratory failure with hypoxia (HCC) Active Problems:   Hypothyroid   Diabetes mellitus (HCC)   Hypokalemia   GERD (gastroesophageal reflux disease)   Right middle lobe pneumonia (HCC)    This patient was discussed with the ED physician, including pertinent vitals, physical exam findings, labs, and imaging.  We also discussed care given by the ED provider.  1. Acute respiratory failure with hypoxia a. Patient dependent on nasal cannula and becomes hypoxic to the 70s without it. b. Troponin also shows heart strain c. Support tray status with nasal cannula to good effect. 2. Right middle lobe pneumonia a. Outpatient failure b. Vanco, cefepime c. Blood cultures, sputum culture obtained d. Flu swab pending e. Droplet precautions f. Strep antigen pending g. CBC, BMP tomorrow 3. Hypokalemia a. Replace potassium and check potassium in the morning 4. Diabetes a. Continue metformin. b. CBGs before meals and nightly c. Sliding scale insulin 5. Hypothyroid a. Continue Synthroid 6. GERD  DVT prophylaxis: Lovenox Consultants: None Code Status: Code Family Communication: Multiple family members in the room Disposition Plan: Patient should be able to return home following admission   Noralee Chars [facility] Pager [phone]  If 7PM-7AM, please contact night-coverage [email or web address] Password [email or web address]

## 2017-11-11 NOTE — ED Triage Notes (Signed)
Pt recently diagnosed with pneumonia.  Pt states she took her last antibiotic 2 days ago. Pt has productive cough, no chest discomfort

## 2017-11-11 NOTE — Progress Notes (Signed)
Pharmacy Antibiotic Note  Patricia Bass is a 57 y.o. female admitted on 11/11/2017 with pneumonia.  Pharmacy has been consulted for Vancomycin dosing.  Plan: Vancomycin 1500mg  IV loading dose, then 750mg  IV every 12 hours.  Goal trough 15-20 mcg/mL.  Also on cefepime 1 gm IV q8h F/U cxs and clinical progress Monitor V/S, labs and levels as indicated  Height: 5\' 1"  (154.9 cm) Weight: 180 lb (81.6 kg) IBW/kg (Calculated) : 47.8  Temp (24hrs), Avg:103.1 F (39.5 C), Min:103.1 F (39.5 C), Max:103.1 F (39.5 C)  Recent Labs  Lab 11/11/17 2000  WBC 11.0*  CREATININE 0.65    Estimated Creatinine Clearance: 74.2 mL/min (by C-G formula based on SCr of 0.65 mg/dL).    Allergies  Allergen Reactions  . Duragesic Disc Transdermal System [Fentanyl] Nausea And Vomiting    Antimicrobials this admission: Vancomycin 11/8 >>  Cefepime 11/8 >>   Dose adjustments this admission: N/A  Microbiology results: 11/1 BCx: pending  Sputum:   MRSA PCR:   Thank you for allowing pharmacy to be a part of this patient's care.  Elder Cyphers, BS Loura Back, [place] Clinical Pharmacist Pager [phone] 11/11/2017 8:59 PM

## 2017-11-11 NOTE — ED Provider Notes (Signed)
[facility] EMERGENCY DEPARTMENT Provider Note   CSN: [id number] Arrival date & time: 11/11/17  1932     History   Chief Complaint Chief Complaint  Patient presents with  . Shortness of Breath    HPI Patricia ABBETT is a 57 y.o. female.  HPI Active smoker with 24/7 oxygen use presents with fever, dyspnea, cough. Patient notes that she completed her last course of antibiotics 2 days ago, and was transiently well, but today had a return of cough, fever, without focal pain, without syncope, without vomiting, diarrhea. Patient notes that she typically has 2 L via nasal cannula, but EMS reports that on arrival the patient was hypoxic, with saturation 70% in spite of using increased oxygen. Patient is here with a female companion who assists with the HPI.  Past Medical History:  Diagnosis Date  . Anemia   . Anxiety   . Arthritis   . Bulging disc   . Carpal tunnel syndrome   . Cervicalgia   . Chronic airway obstruction (HCC)   . Chronic back pain   . COPD (chronic obstructive pulmonary disease) (HCC)   . Coronary artery disease   . Diabetes mellitus (HCC) 01/11/2012  . GERD (gastroesophageal reflux disease)   . Headache(784.0)   . Hypothyroid   . Lumbago   . Migraine   . Myalgia and myositis, unspecified   . Neuropathy   . Pneumonia   . Restless leg syndrome   . Shortness of breath   . Sleep apnea    uses O2 at home. uses it at home all times and at night at 2 liters.    Patient Active Problem List   Diagnosis Date Noted  . Abdominal pain, epigastric 10/27/2016  . IDA (iron deficiency anemia) 10/27/2016  . GERD (gastroesophageal reflux disease) 10/27/2016  . Esophageal dysphagia 10/27/2016  . Acute respiratory failure with hypoxia (HCC) 07/08/2016  . Acute respiratory failure with hypercapnia (HCC) 07/08/2016  . Cellulitis 07/07/2016  . Cellulitis of great toe 07/07/2016  . CAP (community acquired pneumonia) 06/20/2016  . Respiratory failure, acute and chronic (HCC)  06/20/2016  . Hypokalemia 06/20/2016  . Carpal tunnel syndrome   . Migraine   . Hypothyroid   . Myalgia and myositis, unspecified   . Lumbago   . Chronic airway obstruction (HCC)   . Cervicalgia   . Anxiety   . Diabetes mellitus (HCC) 01/11/2012  . S/P cubital tunnel release 04/14/2011  . Ulnar nerve compression 04/14/2011  . Pain in joint, forearm 04/14/2011    Past Surgical History:  Procedure Laterality Date  . BIOPSY  12/06/2016   Procedure: BIOPSY;  Surgeon: Corbin Ade, MD;  Location: [facility];  Service: Endoscopy;;  gastric   . BLADDER SUSPENSION    . CARDIAC CATHETERIZATION    . CHOLECYSTECTOMY    . COLONOSCOPY WITH PROPOFOL N/A 12/06/2016   Procedure: COLONOSCOPY WITH PROPOFOL  Formed stool could not do;  Surgeon: Corbin Ade, MD;  Location: [facility];  Service: Endoscopy;  Laterality: N/A;  10:00am  . ESOPHAGOGASTRODUODENOSCOPY (EGD) WITH PROPOFOL N/A 12/06/2016   Procedure: ESOPHAGOGASTRODUODENOSCOPY (EGD) WITH PROPOFOL;  Surgeon: Corbin Ade, MD;  Location: [facility];  Service: Endoscopy;  Laterality: N/A;  . Elease Hashimoto DILATION N/A 12/06/2016   Procedure: Elease Hashimoto DILATION;  Surgeon: Corbin Ade, MD;  Location: [facility];  Service: Endoscopy;  Laterality: N/A;  . TUBAL LIGATION    . ULNAR NERVE TRANSPOSITION  04/01/2011   Procedure: ULNAR NERVE  DECOMPRESSION/TRANSPOSITION;  Surgeon: Dominica Severin, MD;  Location: [facility] OR;  Service: Orthopedics;  Laterality: Left;  ULNAR NERVE RELEASE WITH ANTERIOR TRANS POSITION REPAIR RECONSTRUCTION AS NECESSARY     OB History   None      Home Medications    Prior to Admission medications   Medication Sig Start Date End Date Taking? Authorizing Provider  albuterol (PROVENTIL HFA;VENTOLIN HFA) 108 (90 BASE) MCG/ACT inhaler Inhale 2 puffs into the lungs every 6 (six) hours as needed for wheezing or shortness of breath.     [provider]  aspirin 81 MG chewable tablet Chew 81 mg 2 (two) times a  week by mouth.     [provider]  atorvastatin (LIPITOR) 40 MG tablet Take 40 mg by mouth at bedtime.    [provider]  BIOTIN PO Take 1 tablet 3 (three) times a week by mouth.    [provider]  cetirizine (ZYRTEC) 10 MG tablet Take 10 mg daily by mouth.     [name], MD  CHANTIX 1 MG tablet Take 1 tablet by mouth 2 (two) times daily. 10/19/16   [provider]  clonazePAM (KLONOPIN) 1 MG tablet Take 0.5-2 mg 3 (three) times daily by mouth. Takes 0.5  tablet in the morning and afternoon and  1 tablet at bedtime.    [provider]  doxepin (SINEQUAN) 100 MG capsule Take 100 mg by mouth at bedtime.    [provider]  HYDROcodone-acetaminophen (NORCO) 10-325 MG per tablet Take 1 tablet every 4 (four) hours by mouth.     [provider]  levothyroxine (SYNTHROID, LEVOTHROID) 75 MCG tablet Take 1 tablet (75 mcg total) by mouth daily before breakfast. 09/05/14   [name], MD  lidocaine (XYLOCAINE) 5 % ointment Apply 1 application topically 4 (four) times daily as needed for mild pain.    [provider]  lisinopril (PRINIVIL,ZESTRIL) 2.5 MG tablet Take 2.5 mg at bedtime by mouth.     [provider]  metFORMIN (GLUCOPHAGE) 500 MG tablet Take 1 tablet (500 mg total) by mouth 2 (two) times daily with a meal. 09/05/14   [name], MD  mometasone (NASONEX) 50 MCG/ACT nasal spray Place 2 sprays into the nose daily as needed (for rhinitis).    [provider]  naphazoline-glycerin (CLEAR EYES) 0.012-0.2 % SOLN Place 1-2 drops every 4 (four) hours as needed into both eyes for eye irritation.     [provider]  omega-3 acid ethyl esters (LOVAZA) 1 g capsule Take 2 g 2 (two) times daily by mouth.     [provider]  omeprazole (PRILOSEC) 40 MG capsule Take 1 capsule (40 mg total) by mouth daily. Patient taking differently: Take 40 mg by mouth daily as needed (for acid reflux).   09/05/14   [name], MD  polyethylene glycol [facility] / Ethelene Hal) packet Take 17 g by mouth daily as needed for mild constipation.    [provider]  predniSONE (DELTASONE) 20 MG tablet Take 3 PO QAM x3days, 2 PO QAM x3days, 1 PO QAM x3days 10/29/17   Johnson, Clanford L, MD  pregabalin (LYRICA) 300 MG capsule Take 1 capsule (300 mg total) by mouth 2 (two) times daily. 09/05/14   [name], MD  rOPINIRole (REQUIP) 0.5 MG tablet Take 1 tablet (0.5 mg total) by mouth 3 (three) times daily. 09/05/14   [name], MD  valACYclovir (VALTREX) 500 MG tablet Take 500 mg by mouth 2 (two) times daily  as needed.     [name], MD    Family History Family History  Problem Relation Age of Onset  . Colon cancer Neg Hx   . Celiac disease Neg Hx     Social History Social History   Tobacco Use  . Smoking status: Current Every Day Smoker    Packs/day: 0.50    Years: 20.00    Pack years: 10.00    Types: Cigarettes, E-cigarettes  . Smokeless tobacco: Never Used  Substance Use Topics  . Alcohol use: No  . Drug use: Yes    Types: Hydrocodone, Marijuana    Comment: occ     Allergies   Duragesic disc transdermal system [fentanyl]   Review of Systems Review of Systems  Constitutional:       Per HPI, otherwise negative  HENT:       Per HPI, otherwise negative  Respiratory:       Per HPI, otherwise negative  Cardiovascular:       Per HPI, otherwise negative  Gastrointestinal: Negative for vomiting.  Endocrine:       Negative aside from HPI  Genitourinary:       Neg aside from HPI   Musculoskeletal:       Per HPI, otherwise negative  Skin: Negative.   Neurological: Positive for weakness. Negative for syncope.     Physical Exam Updated Vital Signs BP 124/75 (BP Location: Left Arm)   Pulse (!) 134   Temp (!) 103.1 F (39.5 C) (Oral)   Resp (!) 22   Ht 5\' 1"  (1.549 m)   Wt 81.6 kg   SpO2 (!) 82%   BMI 34.01 kg/m   Physical Exam    Constitutional: She is oriented to person, place, and time. She has a sickly appearance. No distress.  Sickly yet obese female awake and alert  HENT:  Head: Normocephalic and atraumatic.  Eyes: Conjunctivae and EOM are normal.  Cardiovascular: Regular rhythm. Tachycardia present.  Pulmonary/Chest: Accessory muscle usage present. No stridor. Tachypnea noted. She has decreased breath sounds.  Abdominal: She exhibits no distension.  Musculoskeletal: She exhibits no edema.  Neurological: She is alert and oriented to person, place, and time. No cranial nerve deficit.  Skin: Skin is warm and dry.  Multiple tattoos  Psychiatric: She has a normal mood and affect.  Nursing note and vitals reviewed.    ED Treatments / Results  Labs (all labs ordered are listed, but only abnormal results are displayed) Labs Reviewed  COMPREHENSIVE METABOLIC PANEL  CBC WITH DIFFERENTIAL/PLATELET  TROPONIN I  BRAIN NATRIURETIC PEPTIDE    EKG EKG Interpretation  Date/Time:  Friday November 11 2017 19:42:47 EST Ventricular Rate:  134 PR Interval:    QRS Duration: 132 QT Interval:  313 QTC Calculation: 468 R Axis:   -96 Text Interpretation:  Sinus tachycardia Right bundle branch block Inferior infarct, old Baseline wander in lead(s) II aVF V4 Abnormal ekg Confirmed by Gerhard Munch [phone]) on 11/11/2017 8:21:08 PM   Radiology Dg Chest 2 View  Result Date: 11/11/2017 CLINICAL DATA:  57 y/o F; recent diagnosis of pneumonia. Productive cough, shortness of breath. EXAM: CHEST - 2 VIEW COMPARISON:  10/25/2017 chest radiograph. FINDINGS: Stable normal cardiac silhouette given projection and technique. Decrease in right middle lobe consolidation. Clear lungs. No pleural effusion or pneumothorax. No acute osseous abnormality is evident. IMPRESSION: Consolidation in the right middle lobe compatible with recurrent/residual pneumonia, decreased from prior chest radiographs. Electronically Signed   By: Mitzi Hansen  M.D.   On: 11/11/2017 20:13    Procedures Procedures (including critical care time)  Medications Ordered in ED Medications - No data to display   Initial Impression / Assessment and Plan / ED Course  I have reviewed the triage vital signs and the nursing notes.  Pertinent labs & imaging results that were available during my care of the patient were reviewed by me and considered in my medical decision making (see chart for details).  Chart review notable for admission for multilobar pneumonia 2 weeks ago.  On repeat exam patient is in similar condition, requiring 5 L via nasal cannula remains saturation of 90%. How she is however, awake and alert. We discussed findings including concern for pneumonia, patient will start broad-spectrum antibiotics, required admission given her recent episode, with recurrent for recurrence given her fever, tachycardia, tachypnea, increased work of breathing, evidence for systemic inflammatory response syndrome.   Final Clinical Impressions(s) / ED Diagnoses  HCAP  CRITICAL CARE Performed by: Gerhard Munch Total critical care time: 35 minutes Critical care time was exclusive of separately billable procedures and treating other patients. Critical care was necessary to treat or prevent imminent or life-threatening deterioration. Critical care was time spent personally by me on the following activities: development of treatment plan with patient and/or surrogate as well as nursing, discussions with consultants, evaluation of patient's response to treatment, examination of patient, obtaining history from patient or surrogate, ordering and performing treatments and interventions, ordering and review of laboratory studies, ordering and review of radiographic studies, pulse oximetry and re-evaluation of patient's condition.    Gerhard Munch, MD 11/11/17 2022

## 2017-11-11 NOTE — ED Notes (Signed)
CRITICAL VALUE ALERT  Critical Value:  Troponin 0.04  Date & Time Notied:  11/11/17 2050  Provider Notified: Dr Jeraldine Loots   Orders Received/Actions taken:

## 2017-11-12 LAB — BLOOD CULTURE ID PANEL (REFLEXED)
ACINETOBACTER BAUMANNII: NOT DETECTED
CANDIDA ALBICANS: NOT DETECTED
CANDIDA GLABRATA: NOT DETECTED
CANDIDA PARAPSILOSIS: NOT DETECTED
Candida krusei: NOT DETECTED
Candida tropicalis: NOT DETECTED
ENTEROBACTERIACEAE SPECIES: NOT DETECTED
Enterobacter cloacae complex: NOT DETECTED
Enterococcus species: NOT DETECTED
Escherichia coli: NOT DETECTED
HAEMOPHILUS INFLUENZAE: NOT DETECTED
KLEBSIELLA OXYTOCA: NOT DETECTED
KLEBSIELLA PNEUMONIAE: NOT DETECTED
Listeria monocytogenes: NOT DETECTED
METHICILLIN RESISTANCE: NOT DETECTED
Neisseria meningitidis: NOT DETECTED
Proteus species: NOT DETECTED
Pseudomonas aeruginosa: NOT DETECTED
STREPTOCOCCUS PYOGENES: NOT DETECTED
STREPTOCOCCUS SPECIES: NOT DETECTED
Serratia marcescens: NOT DETECTED
Staphylococcus aureus (BCID): NOT DETECTED
Staphylococcus species: DETECTED — AB
Streptococcus agalactiae: NOT DETECTED
Streptococcus pneumoniae: NOT DETECTED

## 2017-11-12 LAB — MRSA PCR SCREENING: MRSA by PCR: NEGATIVE

## 2017-11-12 LAB — CBC
HEMATOCRIT: 34.3 % — AB (ref 36.0–46.0)
Hemoglobin: 10.1 g/dL — ABNORMAL LOW (ref 12.0–15.0)
MCH: 27.7 pg (ref 26.0–34.0)
MCHC: 29.4 g/dL — ABNORMAL LOW (ref 30.0–36.0)
MCV: 94.2 fL (ref 80.0–100.0)
NRBC: 0 % (ref 0.0–0.2)
PLATELETS: 169 10*3/uL (ref 150–400)
RBC: 3.64 MIL/uL — AB (ref 3.87–5.11)
RDW: 17.1 % — ABNORMAL HIGH (ref 11.5–15.5)
WBC: 8.1 10*3/uL (ref 4.0–10.5)

## 2017-11-12 LAB — BASIC METABOLIC PANEL
ANION GAP: 7 (ref 5–15)
BUN: 11 mg/dL (ref 6–20)
CALCIUM: 8.2 mg/dL — AB (ref 8.9–10.3)
CHLORIDE: 102 mmol/L (ref 98–111)
CO2: 34 mmol/L — AB (ref 22–32)
Creatinine, Ser: 0.62 mg/dL (ref 0.44–1.00)
GFR calc non Af Amer: >60 mL/min (ref >60)
GLUCOSE: 141 mg/dL — AB (ref 70–99)
Potassium: 3 mmol/L — ABNORMAL LOW (ref 3.5–5.1)
Sodium: 143 mmol/L (ref 135–145)

## 2017-11-12 LAB — GLUCOSE, CAPILLARY
GLUCOSE-CAPILLARY: 103 mg/dL — AB (ref 70–99)
GLUCOSE-CAPILLARY: 118 mg/dL — AB (ref 70–99)
Glucose-Capillary: 134 mg/dL — ABNORMAL HIGH (ref 70–99)
Glucose-Capillary: 167 mg/dL — ABNORMAL HIGH (ref 70–99)

## 2017-11-12 LAB — MAGNESIUM: Magnesium: 2.1 mg/dL (ref 1.7–2.4)

## 2017-11-12 MED ORDER — ACETAMINOPHEN 325 MG PO TABS: 650.0000 mg | ORAL_TABLET | Freq: Four times a day (QID) | ORAL | Status: DC | PRN

## 2017-11-12 MED ORDER — VANCOMYCIN HCL IN DEXTROSE 750-5 MG/150ML-% IV SOLN
750.0000 mg | Freq: Two times a day (BID) | INTRAVENOUS | Status: DC
  Administered 2017-11-12 – 2017-11-14 (×4): 750 mg via INTRAVENOUS
  Filled 2017-11-12 (×7): qty 150

## 2017-11-12 MED ORDER — POTASSIUM CHLORIDE 10 MEQ/100ML IV SOLN
10.0000 meq | INTRAVENOUS | Status: AC
  Administered 2017-11-12 (×2): 10 meq via INTRAVENOUS
  Filled 2017-11-12: qty 100

## 2017-11-12 MED ORDER — SODIUM CHLORIDE 0.9 % IV SOLN
INTRAVENOUS | Status: DC
  Administered 2017-11-12: 09:00:00 via INTRAVENOUS

## 2017-11-12 MED ORDER — TRAMADOL HCL 50 MG PO TABS
50.0000 mg | ORAL_TABLET | Freq: Four times a day (QID) | ORAL | Status: DC | PRN
  Administered 2017-11-12 – 2017-11-14 (×3): 50 mg via ORAL
  Filled 2017-11-12 (×3): qty 1

## 2017-11-12 MED ORDER — CLONAZEPAM 0.5 MG PO TABS
0.5000 mg | ORAL_TABLET | Freq: Every day | ORAL | Status: DC
  Administered 2017-11-12 – 2017-11-13 (×2): 0.5 mg via ORAL
  Filled 2017-11-12 (×2): qty 1

## 2017-11-12 MED ORDER — SODIUM CHLORIDE 0.9 % IV SOLN
INTRAVENOUS | Status: DC
  Administered 2017-11-12 – 2017-11-14 (×4): via INTRAVENOUS

## 2017-11-12 NOTE — Progress Notes (Signed)
Subjective: She was admitted with pneumonia hypoxic respiratory failure.  She is been hypotensive.  She is been sleepy and had been continued on her home medications but I am going to hold a number of her medications it could cause her sleepy at least for now.  She has no other new complaints.  She is still short of breath.  She is still wearing oxygen.  No chest pain.  Her potassium level is still low.  Objective: Vital signs in last 24 hours: Temp:  [97.9 F (36.6 C)-103.1 F (39.5 C)] 97.9 F (36.6 C) (11/09 0017) Pulse Rate:  [98-134] 98 (11/09 0749) Resp:  [15-26] 20 (11/09 0749) BP: (90-148)/(58-85) 95/58 (11/09 0749) SpO2:  [79 %-92 %] 92 % (11/09 0017) FiO2 (%):  [40 %] 40 % (11/08 2227) Weight:  [81.6 kg-82.2 kg] 82.2 kg (11/09 0017) Weight change:  Last BM Date: 11/10/17  Intake/Output from previous day: 11/08 0701 - 11/09 0700 In: 602.8 [I.V.:2.8; IV Piggyback:600] Out: 300 [Urine:300]  PHYSICAL EXAM General appearance: She is alert now but gets sleepy quickly Resp: rhonchi Right more than left Cardio: regular rate and rhythm, S1, S2 normal, no murmur, click, rub or gallop GI: soft, non-tender; bowel sounds normal; no masses,  no organomegaly Extremities: extremities normal, atraumatic, no cyanosis or edema  Lab Results:  Results for orders placed or performed during the hospital encounter of 11/11/17 (from the past 48 hour(s))  Comprehensive metabolic panel     Status: Abnormal   Collection Time: 11/11/17  8:00 PM  Result Value Ref Range   Sodium 139 135 - 145 mmol/L   Potassium 2.8 (L) 3.5 - 5.1 mmol/L   Chloride 97 (L) 98 - 111 mmol/L   CO2 31 22 - 32 mmol/L   Glucose, Bld 154 (H) 70 - 99 mg/dL   BUN 10 6 - 20 mg/dL   Creatinine, Ser 0.65 0.44 - 1.00 mg/dL   Calcium 8.6 (L) 8.9 - 10.3 mg/dL   Total Protein 7.4 6.5 - 8.1 g/dL   Albumin 3.5 3.5 - 5.0 g/dL   AST 43 (H) 15 - 41 U/L   ALT 35 0 - 44 U/L   Alkaline Phosphatase 93 38 - 126 U/L   Total Bilirubin  0.7 0.3 - 1.2 mg/dL   GFR calc non Af Amer >60 >60 mL/min   GFR calc Af Amer >60 >60 mL/min    Comment: (NOTE) The eGFR has been calculated using the CKD EPI equation. This calculation has not been validated in all clinical situations. eGFR's persistently <60 mL/min signify possible Chronic Kidney Disease.    Anion gap 11 5 - 15    Comment: Performed at [facility], [address], [place], [place] [address]  CBC WITH DIFFERENTIAL     Status: Abnormal   Collection Time: 11/11/17  8:00 PM  Result Value Ref Range   WBC 11.0 (H) 4.0 - 10.5 K/uL   RBC 4.08 3.87 - 5.11 MIL/uL   Hemoglobin 11.3 (L) 12.0 - 15.0 g/dL   HCT 38.0 36.0 - 46.0 %   MCV 93.1 80.0 - 100.0 fL   MCH 27.7 26.0 - 34.0 pg   MCHC 29.7 (L) 30.0 - 36.0 g/dL   RDW 16.8 (H) 11.5 - 15.5 %   Platelets 205 150 - 400 K/uL   nRBC 0.0 0.0 - 0.2 %   Neutrophils Relative % 73 %   Neutro Abs 8.0 (H) 1.7 - 7.7 K/uL   Lymphocytes Relative 22 %   Lymphs  Abs 2.4 0.7 - 4.0 K/uL   Monocytes Relative 5 %   Monocytes Absolute 0.5 0.1 - 1.0 K/uL   Eosinophils Relative 0 %   Eosinophils Absolute 0.0 0.0 - 0.5 K/uL   Basophils Relative 0 %   Basophils Absolute 0.0 0.0 - 0.1 K/uL   Immature Granulocytes 0 %   Abs Immature Granulocytes 0.04 0.00 - 0.07 K/uL    Comment: Performed at [facility], [address], [place], [place] [address]  Troponin I Once     Status: Abnormal   Collection Time: 11/11/17  8:00 PM  Result Value Ref Range   Troponin I 0.04 (HH) <0.03 ng/mL    Comment: CRITICAL RESULT CALLED TO, READ BACK BY AND VERIFIED WITH: EDWARDS,C ON 11/11/17 AT 2045 BY LOY,C Performed at [facility], [address], [place], [place] [address]   Brain natriuretic peptide     Status: None   Collection Time: 11/11/17  8:00 PM  Result Value Ref Range   B Natriuretic Peptide 88.0 0.0 - 100.0 pg/mL    Comment: Performed at [facility], [address] [place], [place] [address]  Influenza panel by PCR (type A & B)     Status: None    Collection Time: 11/11/17  8:24 PM  Result Value Ref Range   Influenza A By PCR NEGATIVE NEGATIVE   Influenza B By PCR NEGATIVE NEGATIVE    Comment: (NOTE) The Xpert Xpress Flu assay is intended as an aid in the diagnosis of  influenza and should not be used as a sole basis for treatment.  This  assay is FDA approved for nasopharyngeal swab specimens only. Nasal  washings and aspirates are unacceptable for Xpert Xpress Flu testing. Performed at [facility], [address], [place], [place] [address]   Culture, blood (routine x 2) Call MD if unable to obtain prior to antibiotics being given     Status: None (Preliminary result)   Collection Time: 11/11/17  8:45 PM  Result Value Ref Range   Specimen Description      BLOOD BLOOD RIGHT HAND BOTTLES DRAWN AEROBIC AND ANAEROBIC   Special Requests Blood Culture adequate volume    Culture      NO GROWTH < 12 HOURS Performed at [facility], [address] [place], [place] [address]    Report Status PENDING   Culture, blood (routine x 2) Call MD if unable to obtain prior to antibiotics being given     Status: None (Preliminary result)   Collection Time: 11/11/17  8:48 PM  Result Value Ref Range   Specimen Description      BLOOD LEFT ANTECUBITAL BOTTLES DRAWN AEROBIC AND ANAEROBIC   Special Requests      Blood Culture results may not be optimal due to an excessive volume of blood received in culture bottles   Culture      NO GROWTH < 12 HOURS Performed at [facility], [address], [place], [place] [address]    Report Status PENDING   MRSA PCR Screening     Status: None   Collection Time: 11/11/17  9:11 PM  Result Value Ref Range   MRSA by PCR NEGATIVE NEGATIVE    Comment:        The GeneXpert MRSA Assay (FDA approved for NASAL specimens only), is one component of a comprehensive MRSA colonization surveillance program. It is not intended to diagnose MRSA infection nor to guide or monitor treatment for MRSA  infections. Performed at [facility],  [address] [place], [place] [address]   Glucose, capillary     Status: Abnormal   Collection Time: 11/11/17 10:17 PM  Result Value Ref Range   Glucose-Capillary 126 (H) 70 - 99 mg/dL  Basic metabolic panel     Status: Abnormal   Collection Time: 11/12/17  5:35 AM  Result Value Ref Range   Sodium 143 135 - 145 mmol/L   Potassium 3.0 (L) 3.5 - 5.1 mmol/L   Chloride 102 98 - 111 mmol/L   CO2 34 (H) 22 - 32 mmol/L   Glucose, Bld 141 (H) 70 - 99 mg/dL   BUN 11 6 - 20 mg/dL   Creatinine, Ser 0.62 0.44 - 1.00 mg/dL   Calcium 8.2 (L) 8.9 - 10.3 mg/dL   GFR calc non Af Amer >60 >60 mL/min   GFR calc Af Amer >60 >60 mL/min    Comment: (NOTE) The eGFR has been calculated using the CKD EPI equation. This calculation has not been validated in all clinical situations. eGFR's persistently <60 mL/min signify possible Chronic Kidney Disease.    Anion gap 7 5 - 15    Comment: Performed at [facility], [address], [place], [place] [address]  CBC     Status: Abnormal   Collection Time: 11/12/17  5:35 AM  Result Value Ref Range   WBC 8.1 4.0 - 10.5 K/uL   RBC 3.64 (L) 3.87 - 5.11 MIL/uL   Hemoglobin 10.1 (L) 12.0 - 15.0 g/dL   HCT 34.3 (L) 36.0 - 46.0 %   MCV 94.2 80.0 - 100.0 fL   MCH 27.7 26.0 - 34.0 pg   MCHC 29.4 (L) 30.0 - 36.0 g/dL   RDW 17.1 (H) 11.5 - 15.5 %   Platelets 169 150 - 400 K/uL   nRBC 0.0 0.0 - 0.2 %    Comment: Performed at [facility], [address], [place], [place] [address]  Magnesium     Status: None   Collection Time: 11/12/17  5:35 AM  Result Value Ref Range   Magnesium 2.1 1.7 - 2.4 mg/dL    Comment: Performed at [facility], [address] [place], [place] [address]  Glucose, capillary     Status: Abnormal   Collection Time: 11/12/17  7:26 AM  Result Value Ref Range   Glucose-Capillary 134 (H) 70 - 99 mg/dL    ABGS No results for input(s): PHART, PO2ART, TCO2, HCO3 in the last 72 hours.  Invalid input(s):  PCO2 CULTURES Recent Results (from the past 240 hour(s))  Culture, blood (routine x 2) Call MD if unable to obtain prior to antibiotics being given     Status: None (Preliminary result)   Collection Time: 11/11/17  8:45 PM  Result Value Ref Range Status   Specimen Description   Final    BLOOD BLOOD RIGHT HAND BOTTLES DRAWN AEROBIC AND ANAEROBIC   Special Requests Blood Culture adequate volume  Final   Culture   Final    NO GROWTH < 12 HOURS Performed at [facility], [address] [place], [place] [address]    Report Status PENDING  Incomplete  Culture, blood (routine x 2) Call MD if unable to obtain prior to antibiotics being given     Status: None (Preliminary result)   Collection Time: 11/11/17  8:48 PM  Result Value Ref Range Status   Specimen Description   Final    BLOOD LEFT ANTECUBITAL BOTTLES DRAWN AEROBIC AND ANAEROBIC   Special Requests   Final  Blood Culture results may not be optimal due to an excessive volume of blood received in culture bottles   Culture   Final    NO GROWTH < 12 HOURS Performed at [facility], [address], [place], [place] [address]    Report Status PENDING  Incomplete  MRSA PCR Screening     Status: None   Collection Time: 11/11/17  9:11 PM  Result Value Ref Range Status   MRSA by PCR NEGATIVE NEGATIVE Final    Comment:        The GeneXpert MRSA Assay (FDA approved for NASAL specimens only), is one component of a comprehensive MRSA colonization surveillance program. It is not intended to diagnose MRSA infection nor to guide or monitor treatment for MRSA infections. Performed at [facility], [address], [place], [place] [address]    Studies/Results: Dg Chest 2 View  Result Date: 11/11/2017 CLINICAL DATA:  57 y/o F; recent diagnosis of pneumonia. Productive cough, shortness of breath. EXAM: CHEST - 2 VIEW COMPARISON:  10/25/2017 chest radiograph. FINDINGS: Stable normal cardiac silhouette given projection and technique. Decrease  in right middle lobe consolidation. Clear lungs. No pleural effusion or pneumothorax. No acute osseous abnormality is evident. IMPRESSION: Consolidation in the right middle lobe compatible with recurrent/residual pneumonia, decreased from prior chest radiographs. Electronically Signed   By: Kristine Garbe M.D.   On: 11/11/2017 20:13    Medications:  Prior to Admission:  Medications Prior to Admission  Medication Sig Dispense Refill Last Dose  . albuterol (PROVENTIL HFA;VENTOLIN HFA) 108 (90 BASE) MCG/ACT inhaler Inhale 2 puffs into the lungs every 6 (six) hours as needed for wheezing or shortness of breath.    11/10/2017 at Unknown time  . aspirin 81 MG chewable tablet Chew 81 mg 2 (two) times a week by mouth.    11/10/2017 at Unknown time  . atorvastatin (LIPITOR) 40 MG tablet Take 40 mg by mouth every morning.    11/10/2017 at Unknown time  . BIOTIN PO Take 1 tablet 3 (three) times a week by mouth.   11/10/2017 at Unknown time  . cetirizine (ZYRTEC) 10 MG tablet Take 10 mg daily by mouth.    11/10/2017 at Unknown time  . CHANTIX 1 MG tablet Take 1 tablet by mouth 2 (two) times daily.   11/11/2017 at Unknown time  . clonazePAM (KLONOPIN) 1 MG tablet Take 0.5-2 mg 3 (three) times daily by mouth. Takes 0.5  tablet in the morning and afternoon and  1 tablet at bedtime.   11/11/2017 at Unknown time  . doxepin (SINEQUAN) 100 MG capsule Take 100 mg by mouth at bedtime.   11/10/2017 at Unknown time  . HYDROcodone-acetaminophen (NORCO) 10-325 MG per tablet Take 1 tablet every 4 (four) hours by mouth.    Past Week at Unknown time  . ibuprofen (ADVIL,MOTRIN) 800 MG tablet Take 800 mg by mouth 3 (three) times daily.   11/10/2017 at Unknown time  . levothyroxine (SYNTHROID, LEVOTHROID) 75 MCG tablet Take 1 tablet (75 mcg total) by mouth daily before breakfast. 30 tablet 0 11/11/2017 at Unknown time  . lisinopril (PRINIVIL,ZESTRIL) 2.5 MG tablet Take 2.5 mg at bedtime by mouth.    11/11/2017 at Unknown time  .  metFORMIN (GLUCOPHAGE) 500 MG tablet Take 1 tablet (500 mg total) by mouth 2 (two) times daily with a meal. 60 tablet 0 11/11/2017 at Unknown time  . mometasone (NASONEX) 50 MCG/ACT nasal spray Place 2 sprays into the nose daily as needed (for rhinitis).  Past Month at Unknown time  . naphazoline-glycerin (CLEAR EYES) 0.012-0.2 % SOLN Place 1-2 drops every 4 (four) hours as needed into both eyes for eye irritation.    Past Month at Unknown time  . omega-3 acid ethyl esters (LOVAZA) 1 g capsule Take 2 g 2 (two) times daily by mouth.    Past Month at Unknown time  . omeprazole (PRILOSEC) 40 MG capsule Take 1 capsule (40 mg total) by mouth daily. (Patient taking differently: Take 40 mg by mouth daily as needed (for acid reflux). ) 30 capsule 0 Past Month at Unknown time  . polyethylene glycol (MIRALAX / GLYCOLAX) packet Take 17 g by mouth daily as needed for mild constipation.   Past Month at Unknown time  . pregabalin (LYRICA) 300 MG capsule Take 1 capsule (300 mg total) by mouth 2 (two) times daily. 60 capsule 0 11/11/2017 at Unknown time  . rOPINIRole (REQUIP) 0.5 MG tablet Take 1 tablet (0.5 mg total) by mouth 3 (three) times daily. 90 tablet 0 11/11/2017 at Unknown time  . valACYclovir (VALTREX) 500 MG tablet Take 500 mg by mouth 2 (two) times daily as needed.    11/11/2017 at Unknown time  . lidocaine (XYLOCAINE) 5 % ointment Apply 1 application topically 4 (four) times daily as needed for mild pain.   Past Month at Unknown time  . predniSONE (DELTASONE) 20 MG tablet Take 3 PO QAM x3days, 2 PO QAM x3days, 1 PO QAM x3days (Patient not taking: Reported on 11/11/2017) 18 tablet 0 Not Taking at Unknown time   Scheduled: . [START ON 11/14/2017] aspirin  81 mg Oral Once per day on Mon Thu  . atorvastatin  40 mg Oral [id number]  . clonazePAM  0.5 mg Oral QHS  . doxepin  100 mg Oral QHS  . enoxaparin (LOVENOX) injection  40 mg Subcutaneous Q24H  . fluticasone  1 spray Each Nare Daily  . ibuprofen  800 mg Oral  TID  . insulin aspart  0-15 Units Subcutaneous TID WC  . insulin aspart  0-5 Units Subcutaneous QHS  . levothyroxine  75 mcg Oral QAC breakfast  . loratadine  10 mg Oral Daily  . pantoprazole  40 mg Oral Daily  . potassium chloride  40 mEq Oral BID  . rOPINIRole  0.5 mg Oral TID  . varenicline  1 mg Oral BID WC   Continuous: . sodium chloride Stopped (11/12/17 0229)  . sodium chloride    . ceFEPime (MAXIPIME) IV 1 g (11/12/17 2919)  . potassium chloride 10 mEq (11/12/17 1029)   TYO:MAYOKH chloride, acetaminophen, albuterol, lidocaine  Assesment: She is admitted with acute hypoxic respiratory failure.  She is still wearing oxygen.  She has right middle lobe pneumonia and still has significant congestion in her chest.  She has been sleepy and I think is from medication so we are going to hold medications it likely would cause her to be sleepy.  She has hypokalemia and her potassium is still low but she is on 40 mEq twice a day so we will recheck that tomorrow  She has COPD at baseline it is still smoking. Principal Problem:   Acute respiratory failure with hypoxia (HCC) Active Problems:   Hypothyroid   Diabetes mellitus (HCC)   Hypokalemia   GERD (gastroesophageal reflux disease)   Right middle lobe pneumonia ([place])    Plan: Continue current treatments.  She is somewhat hypotensive I think because of her sleepiness so I am going to give her IV fluids.  Hold hydrocodone lorazepam and Lyrica.  Continue other treatments    LOS: 1 day   Malori Myers L 11/12/2017, 10:33 AM

## 2017-11-12 NOTE — Progress Notes (Signed)
Reported BC with Staphylococcus species to Dr. Toniann Fail.

## 2017-11-12 NOTE — Progress Notes (Signed)
Patient very drowsy and unsteady on her feet. Bed alarm, fall mat in place. Patient also instructed to call for assistance. Patient's Hydrocodone and Klonopin have been held. Also, patient's vitals are as follows    11/12/17 0749  Vitals  BP (!) 95/58  BP Location Left Arm  BP Method Manual  Patient Position (if appropriate) Lying  Pulse Rate 98  Pulse Rate Source Radial  Resp 20   [name] informed of abnormal B/P and medications not given.  Will continue to monitor patient.

## 2017-11-12 NOTE — Progress Notes (Signed)
Patient has gram positive cocci in aerobic bottle- blood culture. Dr. Adrian Blackwater verbally informed.

## 2017-11-12 NOTE — Progress Notes (Signed)
Pharmacy Antibiotic Note  Patricia Bass is a 57 y.o. female admitted on 11/11/2017 with bacteVancomycin 1500mg  IV loading dose, then 750mg  IV every 12 hours.  Goal trough 15-20 mcg/mL.remia.  Pharmacy has been consulted for Vancomycin dosing.  Plan: Restart Vancomycin 750mg  IV every 12 hours.  Goal trough 15-20 mcg/mL. Also on cefepime 1 gm IV q8h F/U cxs and clinical progress Monitor V/S, labs and levels as indicated  Height: 5\' 1"  (154.9 cm) Weight: 181 lb 3.5 oz (82.2 kg) IBW/kg (Calculated) : 47.8  Temp (24hrs), Avg:98.7 F (37.1 C), Min:97.9 F (36.6 C), Max:99.2 F (37.3 C)  Recent Labs  Lab 11/11/17 2000 11/12/17 0535  WBC 11.0* 8.1  CREATININE 0.65 0.62    Estimated Creatinine Clearance: 74.5 mL/min (by C-G formula based on SCr of 0.62 mg/dL).    Allergies  Allergen Reactions  . Duragesic Disc Transdermal System [Fentanyl] Nausea And Vomiting    Antimicrobials this admission: Vancomycin 11/8 >>  Cefepime 11/8 >>   Dose adjustments this admission: N/A  Microbiology results: 11/1 BCx: 1 of 2 Bottles +CONS  11/8MRSA PCR: negative  Thank you for allowing pharmacy to be a part of this patient's care.  Elder Cyphers, BS Loura Back, [place] Clinical Pharmacist Pager [phone] 11/12/2017 8:37 PM

## 2017-11-13 LAB — CBC WITH DIFFERENTIAL/PLATELET
Abs Immature Granulocytes: 0.02 10*3/uL (ref 0.00–0.07)
BASOS PCT: 0 %
Basophils Absolute: 0 10*3/uL (ref 0.0–0.1)
EOS ABS: 0.1 10*3/uL (ref 0.0–0.5)
EOS PCT: 1 %
HCT: 32 % — ABNORMAL LOW (ref 36.0–46.0)
Hemoglobin: 9 g/dL — ABNORMAL LOW (ref 12.0–15.0)
Immature Granulocytes: 0 %
Lymphocytes Relative: 37 %
Lymphs Abs: 1.7 10*3/uL (ref 0.7–4.0)
MCH: 26.9 pg (ref 26.0–34.0)
MCHC: 28.1 g/dL — ABNORMAL LOW (ref 30.0–36.0)
MCV: 95.5 fL (ref 80.0–100.0)
MONO ABS: 0.3 10*3/uL (ref 0.1–1.0)
Monocytes Relative: 7 %
NEUTROS ABS: 2.6 10*3/uL (ref 1.7–7.7)
Neutrophils Relative %: 55 %
PLATELETS: 154 10*3/uL (ref 150–400)
RBC: 3.35 MIL/uL — AB (ref 3.87–5.11)
RDW: 17.4 % — AB (ref 11.5–15.5)
WBC: 4.7 10*3/uL (ref 4.0–10.5)
nRBC: 0 % (ref 0.0–0.2)

## 2017-11-13 LAB — COMPREHENSIVE METABOLIC PANEL
ALT: 28 U/L (ref 0–44)
ANION GAP: 4 — AB (ref 5–15)
AST: 36 U/L (ref 15–41)
Albumin: 2.7 g/dL — ABNORMAL LOW (ref 3.5–5.0)
Alkaline Phosphatase: 69 U/L (ref 38–126)
BUN: 9 mg/dL (ref 6–20)
CHLORIDE: 108 mmol/L (ref 98–111)
CO2: 33 mmol/L — AB (ref 22–32)
Calcium: 7.9 mg/dL — ABNORMAL LOW (ref 8.9–10.3)
Creatinine, Ser: 0.49 mg/dL (ref 0.44–1.00)
GFR calc non Af Amer: >60 mL/min (ref >60)
Glucose, Bld: 101 mg/dL — ABNORMAL HIGH (ref 70–99)
Potassium: 3.9 mmol/L (ref 3.5–5.1)
SODIUM: 145 mmol/L (ref 135–145)
Total Bilirubin: 0.5 mg/dL (ref 0.3–1.2)
Total Protein: 5.7 g/dL — ABNORMAL LOW (ref 6.5–8.1)

## 2017-11-13 LAB — GLUCOSE, CAPILLARY
GLUCOSE-CAPILLARY: 87 mg/dL (ref 70–99)
GLUCOSE-CAPILLARY: 176 mg/dL — AB (ref 70–99)
Glucose-Capillary: 132 mg/dL — ABNORMAL HIGH (ref 70–99)
Glucose-Capillary: 146 mg/dL — ABNORMAL HIGH (ref 70–99)

## 2017-11-13 LAB — HIV ANTIBODY (ROUTINE TESTING W REFLEX): HIV SCREEN 4TH GENERATION: NONREACTIVE

## 2017-11-13 LAB — STREP PNEUMONIAE URINARY ANTIGEN: STREP PNEUMO URINARY ANTIGEN: NEGATIVE

## 2017-11-13 LAB — MAGNESIUM: MAGNESIUM: 1.9 mg/dL (ref 1.7–2.4)

## 2017-11-13 NOTE — Progress Notes (Signed)
Subjective: She is here with pneumonia COPD.  She has a positive blood culture for staph which may be a contaminant.  She has acute respiratory failure with hypoxia.  She was very somnolent yesterday but much better today.  She says she like to go home.  Objective: Vital signs in last 24 hours: Temp:  [98 F (36.7 C)-98.9 F (37.2 C)] 98.3 F (36.8 C) (11/10 0540) Pulse Rate:  [84-115] 84 (11/10 0540) Resp:  [18-19] 18 (11/10 0540) BP: (102-111)/(61-72) 102/61 (11/10 0540) SpO2:  [90 %-100 %] 100 % (11/10 0540) Weight change:  Last BM Date: 11/13/17  Intake/Output from previous day: 11/09 0701 - 11/10 0700 In: 3070.2 [P.O.:1320; I.V.:1202.8; IV Piggyback:547.4] Out: 3200 [Urine:3200]  PHYSICAL EXAM General appearance: alert, cooperative and no distress Resp: rhonchi bilaterally and wheezes bilaterally Cardio: regular rate and rhythm, S1, S2 normal, no murmur, click, rub or gallop GI: soft, non-tender; bowel sounds normal; no masses,  no organomegaly Extremities: extremities normal, atraumatic, no cyanosis or edema  Lab Results:  Results for orders placed or performed during the hospital encounter of 11/11/17 (from the past 48 hour(s))  Comprehensive metabolic panel     Status: Abnormal   Collection Time: 11/11/17  8:00 PM  Result Value Ref Range   Sodium 139 135 - 145 mmol/L   Potassium 2.8 (L) 3.5 - 5.1 mmol/L   Chloride 97 (L) 98 - 111 mmol/L   CO2 31 22 - 32 mmol/L   Glucose, Bld 154 (H) 70 - 99 mg/dL   BUN 10 6 - 20 mg/dL   Creatinine, Ser 0.65 0.44 - 1.00 mg/dL   Calcium 8.6 (L) 8.9 - 10.3 mg/dL   Total Protein 7.4 6.5 - 8.1 g/dL   Albumin 3.5 3.5 - 5.0 g/dL   AST 43 (H) 15 - 41 U/L   ALT 35 0 - 44 U/L   Alkaline Phosphatase 93 38 - 126 U/L   Total Bilirubin 0.7 0.3 - 1.2 mg/dL   GFR calc non Af Amer >60 >60 mL/min   GFR calc Af Amer >60 >60 mL/min    Comment: (NOTE) The eGFR has been calculated using the CKD EPI equation. This calculation has not been  validated in all clinical situations. eGFR's persistently <60 mL/min signify possible Chronic Kidney Disease.    Anion gap 11 5 - 15    Comment: Performed at [facility], [address] [place], [place] [address]  CBC WITH DIFFERENTIAL     Status: Abnormal   Collection Time: 11/11/17  8:00 PM  Result Value Ref Range   WBC 11.0 (H) 4.0 - 10.5 K/uL   RBC 4.08 3.87 - 5.11 MIL/uL   Hemoglobin 11.3 (L) 12.0 - 15.0 g/dL   HCT 38.0 36.0 - 46.0 %   MCV 93.1 80.0 - 100.0 fL   MCH 27.7 26.0 - 34.0 pg   MCHC 29.7 (L) 30.0 - 36.0 g/dL   RDW 16.8 (H) 11.5 - 15.5 %   Platelets 205 150 - 400 K/uL   nRBC 0.0 0.0 - 0.2 %   Neutrophils Relative % 73 %   Neutro Abs 8.0 (H) 1.7 - 7.7 K/uL   Lymphocytes Relative 22 %   Lymphs Abs 2.4 0.7 - 4.0 K/uL   Monocytes Relative 5 %   Monocytes Absolute 0.5 0.1 - 1.0 K/uL   Eosinophils Relative 0 %   Eosinophils Absolute 0.0 0.0 - 0.5 K/uL   Basophils Relative 0 %   Basophils Absolute 0.0 0.0 - 0.1 K/uL  Immature Granulocytes 0 %   Abs Immature Granulocytes 0.04 0.00 - 0.07 K/uL    Comment: Performed at [facility], [address], [place], [place] [address]  Troponin I Once     Status: Abnormal   Collection Time: 11/11/17  8:00 PM  Result Value Ref Range   Troponin I 0.04 (HH) <0.03 ng/mL    Comment: CRITICAL RESULT CALLED TO, READ BACK BY AND VERIFIED WITH: EDWARDS,C ON 11/11/17 AT 2045 BY LOY,C Performed at [facility], [address], [place], [place] [address]   Brain natriuretic peptide     Status: None   Collection Time: 11/11/17  8:00 PM  Result Value Ref Range   B Natriuretic Peptide 88.0 0.0 - 100.0 pg/mL    Comment: Performed at [facility], [address], [place], [place] [address]  Influenza panel by PCR (type A & B)     Status: None   Collection Time: 11/11/17  8:24 PM  Result Value Ref Range   Influenza A By PCR NEGATIVE NEGATIVE   Influenza B By PCR NEGATIVE NEGATIVE    Comment: (NOTE) The Xpert Xpress Flu assay is intended as  an aid in the diagnosis of  influenza and should not be used as a sole basis for treatment.  This  assay is FDA approved for nasopharyngeal swab specimens only. Nasal  washings and aspirates are unacceptable for Xpert Xpress Flu testing. Performed at [facility], [address], [place], [place] [address]   Culture, blood (routine x 2) Call MD if unable to obtain prior to antibiotics being given     Status: None (Preliminary result)   Collection Time: 11/11/17  8:45 PM  Result Value Ref Range   Specimen Description      BLOOD BLOOD RIGHT HAND BOTTLES DRAWN AEROBIC AND ANAEROBIC   Special Requests Blood Culture adequate volume    Culture      NO GROWTH 2 DAYS Performed at [facility], [address] [place], [place] [address]    Report Status PENDING   Culture, blood (routine x 2) Call MD if unable to obtain prior to antibiotics being given     Status: Abnormal (Preliminary result)   Collection Time: 11/11/17  8:48 PM  Result Value Ref Range   Specimen Description      BLOOD LEFT ANTECUBITAL BOTTLES DRAWN AEROBIC AND ANAEROBIC Performed at [facility], [address], [place], [place] [address]    Special Requests      Blood Culture results may not be optimal due to an excessive volume of blood received in culture bottles Performed at [facility], [address], [place], [place] [address]    Culture  Setup Time      GRAM POSITIVE COCCI Gram Stain Report Called to,Read Back By and Verified With: BULLINS,L. AT 1616 ON 11/12/2017 BY EVA AEROBIC BOTTLE ONLY Performed at [place] BY AND VERIFIED WITH: RN LISA [place] [phone] [place] Performed at [facility], [place] [address], [place], [place] [address]    Culture STAPHYLOCOCCUS SPECIES (COAGULASE NEGATIVE) (A)    Report Status PENDING   Blood Culture ID Panel (Reflexed)     Status: Abnormal   Collection Time: 11/11/17  8:48 PM  Result Value Ref Range   Enterococcus species NOT  DETECTED NOT DETECTED   Listeria monocytogenes NOT DETECTED NOT DETECTED   Staphylococcus species DETECTED (A) NOT DETECTED    Comment: Methicillin (oxacillin) susceptible coagulase negative staphylococcus. Possible  blood culture contaminant (unless isolated from more than one blood culture draw or clinical case suggests pathogenicity). No antibiotic treatment is indicated for blood  culture contaminants. CRITICAL RESULT CALLED TO, READ BACK BY AND VERIFIED WITH: RN LISA [place] [phone] [facility]    Staphylococcus aureus (BCID) NOT DETECTED NOT DETECTED   Methicillin resistance NOT DETECTED NOT DETECTED   Streptococcus species NOT DETECTED NOT DETECTED   Streptococcus agalactiae NOT DETECTED NOT DETECTED   Streptococcus pneumoniae NOT DETECTED NOT DETECTED   Streptococcus pyogenes NOT DETECTED NOT DETECTED   Acinetobacter baumannii NOT DETECTED NOT DETECTED   Enterobacteriaceae species NOT DETECTED NOT DETECTED   Enterobacter cloacae complex NOT DETECTED NOT DETECTED   Escherichia coli NOT DETECTED NOT DETECTED   Klebsiella oxytoca NOT DETECTED NOT DETECTED   Klebsiella pneumoniae NOT DETECTED NOT DETECTED   Proteus species NOT DETECTED NOT DETECTED   Serratia marcescens NOT DETECTED NOT DETECTED   Haemophilus influenzae NOT DETECTED NOT DETECTED   Neisseria meningitidis NOT DETECTED NOT DETECTED   Pseudomonas aeruginosa NOT DETECTED NOT DETECTED   Candida albicans NOT DETECTED NOT DETECTED   Candida glabrata NOT DETECTED NOT DETECTED   Candida krusei NOT DETECTED NOT DETECTED   Candida parapsilosis NOT DETECTED NOT DETECTED   Candida tropicalis NOT DETECTED NOT DETECTED    Comment: Performed at [facility], [address], [place], [place] [address]  MRSA PCR Screening     Status: None   Collection Time: 11/11/17  9:11 PM  Result Value Ref Range   MRSA by PCR NEGATIVE NEGATIVE    Comment:        The GeneXpert MRSA Assay (FDA approved for NASAL specimens only), is one  component of a comprehensive MRSA colonization surveillance program. It is not intended to diagnose MRSA infection nor to guide or monitor treatment for MRSA infections. Performed at [facility], [address] [place], [place] [address]   Glucose, capillary     Status: Abnormal   Collection Time: 11/11/17 10:17 PM  Result Value Ref Range   Glucose-Capillary 126 (H) 70 - 99 mg/dL  HIV antibody (Routine Screening)     Status: None   Collection Time: 11/12/17  5:35 AM  Result Value Ref Range   HIV Screen 4th Generation wRfx Non Reactive Non Reactive    Comment: (NOTE) Performed At: [facility] [place], [place] [id number] Rush Farmer MD QI:[id number]   Basic metabolic panel     Status: Abnormal   Collection Time: 11/12/17  5:35 AM  Result Value Ref Range   Sodium 143 135 - 145 mmol/L   Potassium 3.0 (L) 3.5 - 5.1 mmol/L   Chloride 102 98 - 111 mmol/L   CO2 34 (H) 22 - 32 mmol/L   Glucose, Bld 141 (H) 70 - 99 mg/dL   BUN 11 6 - 20 mg/dL   Creatinine, Ser 0.62 0.44 - 1.00 mg/dL   Calcium 8.2 (L) 8.9 - 10.3 mg/dL   GFR calc non Af Amer >60 >60 mL/min   GFR calc Af Amer >60 >60 mL/min    Comment: (NOTE) The eGFR has been calculated using the CKD EPI equation. This calculation has not been validated in all clinical situations. eGFR's persistently <60 mL/min signify possible Chronic Kidney Disease.    Anion gap 7 5 - 15    Comment: Performed at [facility], [address], [place], [place] [address]  CBC     Status: Abnormal   Collection Time: 11/12/17  5:35 AM  Result Value Ref Range   WBC 8.1 4.0 - 10.5 K/uL   RBC 3.64 (L) 3.87 - 5.11 MIL/uL   Hemoglobin 10.1 (L) 12.0 - 15.0 g/dL   HCT 34.3 (L) 36.0 - 46.0 %   MCV 94.2 80.0 - 100.0 fL   MCH 27.7 26.0 - 34.0 pg   MCHC 29.4 (L) 30.0 - 36.0 g/dL   RDW 17.1 (H) 11.5 - 15.5 %   Platelets 169 150 - 400 K/uL   nRBC 0.0 0.0 - 0.2 %    Comment: Performed at [facility], [address]  [place], [place] [address]  Magnesium     Status: None   Collection Time: 11/12/17  5:35 AM  Result Value Ref Range   Magnesium 2.1 1.7 - 2.4 mg/dL    Comment: Performed at [facility], [address], [place], [place] [address]  Glucose, capillary     Status: Abnormal   Collection Time: 11/12/17  7:26 AM  Result Value Ref Range   Glucose-Capillary 134 (H) 70 - 99 mg/dL  Glucose, capillary     Status: Abnormal   Collection Time: 11/12/17 11:35 AM  Result Value Ref Range   Glucose-Capillary 103 (H) 70 - 99 mg/dL  Glucose, capillary     Status: Abnormal   Collection Time: 11/12/17  4:46 PM  Result Value Ref Range   Glucose-Capillary 167 (H) 70 - 99 mg/dL  Glucose, capillary     Status: Abnormal   Collection Time: 11/12/17  9:51 PM  Result Value Ref Range   Glucose-Capillary 118 (H) 70 - 99 mg/dL  CBC with Differential/Platelet     Status: Abnormal   Collection Time: 11/13/17  6:42 AM  Result Value Ref Range   WBC 4.7 4.0 - 10.5 K/uL   RBC 3.35 (L) 3.87 - 5.11 MIL/uL   Hemoglobin 9.0 (L) 12.0 - 15.0 g/dL   HCT 32.0 (L) 36.0 - 46.0 %   MCV 95.5 80.0 - 100.0 fL   MCH 26.9 26.0 - 34.0 pg   MCHC 28.1 (L) 30.0 - 36.0 g/dL   RDW 17.4 (H) 11.5 - 15.5 %   Platelets 154 150 - 400 K/uL   nRBC 0.0 0.0 - 0.2 %   Neutrophils Relative % 55 %   Neutro Abs 2.6 1.7 - 7.7 K/uL   Lymphocytes Relative 37 %   Lymphs Abs 1.7 0.7 - 4.0 K/uL   Monocytes Relative 7 %   Monocytes Absolute 0.3 0.1 - 1.0 K/uL   Eosinophils Relative 1 %   Eosinophils Absolute 0.1 0.0 - 0.5 K/uL   Basophils Relative 0 %   Basophils Absolute 0.0 0.0 - 0.1 K/uL   Immature Granulocytes 0 %   Abs Immature Granulocytes 0.02 0.00 - 0.07 K/uL    Comment: Performed at [facility], [address] [place], [place] [address]  Comprehensive metabolic panel     Status: Abnormal   Collection Time: 11/13/17  6:42 AM  Result Value Ref Range   Sodium 145 135 - 145 mmol/L   Potassium 3.9 3.5 - 5.1 mmol/L    Comment: DELTA CHECK NOTED    Chloride 108 98 - 111 mmol/L   CO2 33 (H) 22 - 32 mmol/L   Glucose, Bld 101 (H) 70 - 99 mg/dL   BUN 9 6 - 20 mg/dL   Creatinine, Ser 0.49 0.44 - 1.00 mg/dL   Calcium 7.9 (L) 8.9 - 10.3 mg/dL   Total Protein 5.7 (L) 6.5 - 8.1 g/dL  Albumin 2.7 (L) 3.5 - 5.0 g/dL   AST 36 15 - 41 U/L   ALT 28 0 - 44 U/L   Alkaline Phosphatase 69 38 - 126 U/L   Total Bilirubin 0.5 0.3 - 1.2 mg/dL   GFR calc non Af Amer >60 >60 mL/min   GFR calc Af Amer >60 >60 mL/min    Comment: (NOTE) The eGFR has been calculated using the CKD EPI equation. This calculation has not been validated in all clinical situations. eGFR's persistently <60 mL/min signify possible Chronic Kidney Disease.    Anion gap 4 (L) 5 - 15    Comment: Performed at [facility], [address], [place], [place] [address]  Magnesium     Status: None   Collection Time: 11/13/17  6:42 AM  Result Value Ref Range   Magnesium 1.9 1.7 - 2.4 mg/dL    Comment: Performed at [facility], [address] [place], [place] [address]  Glucose, capillary     Status: None   Collection Time: 11/13/17  8:24 AM  Result Value Ref Range   Glucose-Capillary 87 70 - 99 mg/dL    ABGS No results for input(s): PHART, PO2ART, TCO2, HCO3 in the last 72 hours.  Invalid input(s): PCO2 CULTURES Recent Results (from the past 240 hour(s))  Culture, blood (routine x 2) Call MD if unable to obtain prior to antibiotics being given     Status: None (Preliminary result)   Collection Time: 11/11/17  8:45 PM  Result Value Ref Range Status   Specimen Description   Final    BLOOD BLOOD RIGHT HAND BOTTLES DRAWN AEROBIC AND ANAEROBIC   Special Requests Blood Culture adequate volume  Final   Culture   Final    NO GROWTH 2 DAYS Performed at [facility], [address] [place], [place] [address]    Report Status PENDING  Incomplete  Culture, blood (routine x 2) Call MD if unable to obtain prior to antibiotics being given     Status: Abnormal (Preliminary result)    Collection Time: 11/11/17  8:48 PM  Result Value Ref Range Status   Specimen Description   Final    BLOOD LEFT ANTECUBITAL BOTTLES DRAWN AEROBIC AND ANAEROBIC Performed at [facility], [address] [place], [place] [address]    Special Requests   Final    Blood Culture results may not be optimal due to an excessive volume of blood received in culture bottles Performed at [facility], [address], [place], [place] [address]    Culture  Setup Time   Final    GRAM POSITIVE COCCI Gram Stain Report Called to,Read Back By and Verified With: BULLINS,L. AT 1616 ON 11/12/2017 BY EVA AEROBIC BOTTLE ONLY Performed at [place] BY AND VERIFIED WITH: RN LISA [place] [phone] [place] Performed at [facility], [place] [address] [place], [place] [address]    Culture STAPHYLOCOCCUS SPECIES (COAGULASE NEGATIVE) (A)  Final   Report Status PENDING  Incomplete  Blood Culture ID Panel (Reflexed)     Status: Abnormal   Collection Time: 11/11/17  8:48 PM  Result Value Ref Range Status   Enterococcus species NOT DETECTED NOT DETECTED Final   Listeria monocytogenes NOT DETECTED NOT DETECTED Final   Staphylococcus species DETECTED (A) NOT DETECTED Final    Comment: Methicillin (oxacillin) susceptible coagulase negative staphylococcus. Possible blood culture contaminant (unless isolated from more than one blood culture draw or clinical case suggests pathogenicity).  No antibiotic treatment is indicated for blood  culture contaminants. CRITICAL RESULT CALLED TO, READ BACK BY AND VERIFIED WITH: RN LISA [place] [phone] FCP    Staphylococcus aureus (BCID) NOT DETECTED NOT DETECTED Final   Methicillin resistance NOT DETECTED NOT DETECTED Final   Streptococcus species NOT DETECTED NOT DETECTED Final   Streptococcus agalactiae NOT DETECTED NOT DETECTED Final   Streptococcus pneumoniae NOT DETECTED NOT DETECTED Final   Streptococcus pyogenes NOT DETECTED NOT  DETECTED Final   Acinetobacter baumannii NOT DETECTED NOT DETECTED Final   Enterobacteriaceae species NOT DETECTED NOT DETECTED Final   Enterobacter cloacae complex NOT DETECTED NOT DETECTED Final   Escherichia coli NOT DETECTED NOT DETECTED Final   Klebsiella oxytoca NOT DETECTED NOT DETECTED Final   Klebsiella pneumoniae NOT DETECTED NOT DETECTED Final   Proteus species NOT DETECTED NOT DETECTED Final   Serratia marcescens NOT DETECTED NOT DETECTED Final   Haemophilus influenzae NOT DETECTED NOT DETECTED Final   Neisseria meningitidis NOT DETECTED NOT DETECTED Final   Pseudomonas aeruginosa NOT DETECTED NOT DETECTED Final   Candida albicans NOT DETECTED NOT DETECTED Final   Candida glabrata NOT DETECTED NOT DETECTED Final   Candida krusei NOT DETECTED NOT DETECTED Final   Candida parapsilosis NOT DETECTED NOT DETECTED Final   Candida tropicalis NOT DETECTED NOT DETECTED Final    Comment: Performed at [facility], [address] [place], [place] [address]  MRSA PCR Screening     Status: None   Collection Time: 11/11/17  9:11 PM  Result Value Ref Range Status   MRSA by PCR NEGATIVE NEGATIVE Final    Comment:        The GeneXpert MRSA Assay (FDA approved for NASAL specimens only), is one component of a comprehensive MRSA colonization surveillance program. It is not intended to diagnose MRSA infection nor to guide or monitor treatment for MRSA infections. Performed at [facility], [address] [place], [place] [address]    Studies/Results: Dg Chest 2 View  Result Date: 11/11/2017 CLINICAL DATA:  57 y/o F; recent diagnosis of pneumonia. Productive cough, shortness of breath. EXAM: CHEST - 2 VIEW COMPARISON:  10/25/2017 chest radiograph. FINDINGS: Stable normal cardiac silhouette given projection and technique. Decrease in right middle lobe consolidation. Clear lungs. No pleural effusion or pneumothorax. No acute osseous abnormality is evident. IMPRESSION: Consolidation  in the right middle lobe compatible with recurrent/residual pneumonia, decreased from prior chest radiographs. Electronically Signed   By: Kristine Garbe M.D.   On: 11/11/2017 20:13    Medications:  Prior to Admission:  Medications Prior to Admission  Medication Sig Dispense Refill Last Dose  . albuterol (PROVENTIL HFA;VENTOLIN HFA) 108 (90 BASE) MCG/ACT inhaler Inhale 2 puffs into the lungs every 6 (six) hours as needed for wheezing or shortness of breath.    11/10/2017 at Unknown time  . aspirin 81 MG chewable tablet Chew 81 mg 2 (two) times a week by mouth.    11/10/2017 at Unknown time  . atorvastatin (LIPITOR) 40 MG tablet Take 40 mg by mouth every morning.    11/10/2017 at Unknown time  . BIOTIN PO Take 1 tablet 3 (three) times a week by mouth.   11/10/2017 at Unknown time  . cetirizine (ZYRTEC) 10 MG tablet Take 10 mg daily by mouth.    11/10/2017 at Unknown time  . CHANTIX 1 MG tablet Take 1 tablet by mouth 2 (two) times daily.   11/11/2017 at Unknown time  . clonazePAM (KLONOPIN) 1 MG  tablet Take 0.5-2 mg 3 (three) times daily by mouth. Takes 0.5  tablet in the morning and afternoon and  1 tablet at bedtime.   11/11/2017 at Unknown time  . doxepin (SINEQUAN) 100 MG capsule Take 100 mg by mouth at bedtime.   11/10/2017 at Unknown time  . HYDROcodone-acetaminophen (NORCO) 10-325 MG per tablet Take 1 tablet every 4 (four) hours by mouth.    Past Week at Unknown time  . ibuprofen (ADVIL,MOTRIN) 800 MG tablet Take 800 mg by mouth 3 (three) times daily.   11/10/2017 at Unknown time  . levothyroxine (SYNTHROID, LEVOTHROID) 75 MCG tablet Take 1 tablet (75 mcg total) by mouth daily before breakfast. 30 tablet 0 11/11/2017 at Unknown time  . lisinopril (PRINIVIL,ZESTRIL) 2.5 MG tablet Take 2.5 mg at bedtime by mouth.    11/11/2017 at Unknown time  . metFORMIN (GLUCOPHAGE) 500 MG tablet Take 1 tablet (500 mg total) by mouth 2 (two) times daily with a meal. 60 tablet 0 11/11/2017 at Unknown time  .  mometasone (NASONEX) 50 MCG/ACT nasal spray Place 2 sprays into the nose daily as needed (for rhinitis).   Past Month at Unknown time  . naphazoline-glycerin (CLEAR EYES) 0.012-0.2 % SOLN Place 1-2 drops every 4 (four) hours as needed into both eyes for eye irritation.    Past Month at Unknown time  . omega-3 acid ethyl esters (LOVAZA) 1 g capsule Take 2 g 2 (two) times daily by mouth.    Past Month at Unknown time  . omeprazole (PRILOSEC) 40 MG capsule Take 1 capsule (40 mg total) by mouth daily. (Patient taking differently: Take 40 mg by mouth daily as needed (for acid reflux). ) 30 capsule 0 Past Month at Unknown time  . polyethylene glycol (MIRALAX / GLYCOLAX) packet Take 17 g by mouth daily as needed for mild constipation.   Past Month at Unknown time  . pregabalin (LYRICA) 300 MG capsule Take 1 capsule (300 mg total) by mouth 2 (two) times daily. 60 capsule 0 11/11/2017 at Unknown time  . rOPINIRole (REQUIP) 0.5 MG tablet Take 1 tablet (0.5 mg total) by mouth 3 (three) times daily. 90 tablet 0 11/11/2017 at Unknown time  . valACYclovir (VALTREX) 500 MG tablet Take 500 mg by mouth 2 (two) times daily as needed.    11/11/2017 at Unknown time  . lidocaine (XYLOCAINE) 5 % ointment Apply 1 application topically 4 (four) times daily as needed for mild pain.   Past Month at Unknown time  . predniSONE (DELTASONE) 20 MG tablet Take 3 PO QAM x3days, 2 PO QAM x3days, 1 PO QAM x3days (Patient not taking: Reported on 11/11/2017) 18 tablet 0 Not Taking at Unknown time   Scheduled: . [START ON 11/14/2017] aspirin  81 mg Oral Once per day on Mon Thu  . atorvastatin  40 mg Oral [id number]  . clonazePAM  0.5 mg Oral QHS  . doxepin  100 mg Oral QHS  . enoxaparin (LOVENOX) injection  40 mg Subcutaneous Q24H  . fluticasone  1 spray Each Nare Daily  . ibuprofen  800 mg Oral TID  . insulin aspart  0-15 Units Subcutaneous TID WC  . insulin aspart  0-5 Units Subcutaneous QHS  . levothyroxine  75 mcg Oral QAC breakfast  .  loratadine  10 mg Oral Daily  . pantoprazole  40 mg Oral Daily  . potassium chloride  40 mEq Oral BID  . rOPINIRole  0.5 mg Oral TID  . varenicline  1 mg Oral  BID WC   Continuous: . sodium chloride Stopped (11/12/17 0229)  . sodium chloride 100 mL/hr at 11/13/17 0048  . ceFEPime (MAXIPIME) IV 1 g (11/13/17 9971)  . vancomycin 750 mg (11/13/17 0955)   KEU:VHAWUJ chloride, acetaminophen, albuterol, lidocaine, traMADol  Assesment: She was admitted with acute hypoxic respiratory failure.  She has right middle lobe pneumonia.  She has COPD exacerbation.  She was very somnolent I think from medications and her medications are going to need to be adjusted when she goes home.  She has bacteremia with positive blood culture but that may be a contaminant. Principal Problem:   Acute respiratory failure with hypoxia (HCC) Active Problems:   Hypothyroid   Diabetes mellitus (HCC)   Hypokalemia   GERD (gastroesophageal reflux disease)   Right middle lobe pneumonia ([place])    Plan: Continue treatments.  Possible discharge in the morning.    LOS: 2 days   Loisann Roach L 11/13/2017, 10:29 AM

## 2017-11-14 LAB — GLUCOSE, CAPILLARY: Glucose-Capillary: 119 mg/dL — ABNORMAL HIGH (ref 70–99)

## 2017-11-14 MED ORDER — CEFUROXIME AXETIL 500 MG PO TABS: 500.0000 mg | ORAL_TABLET | Freq: Two times a day (BID) | ORAL | 0 refills | Status: DC

## 2017-11-14 MED ORDER — PREDNISONE 10 MG (21) PO TBPK: ORAL_TABLET | ORAL | 0 refills | Status: DC

## 2017-11-14 NOTE — Progress Notes (Signed)
Subjective: She says she feels well and wants to go home.  Her positive blood culture does appear to be a contaminant.  Objective: Vital signs in last 24 hours: Temp:  [97.4 F (36.3 C)-99.2 F (37.3 C)] 97.4 F (36.3 C) (11/11 0553) Pulse Rate:  [102-107] 102 (11/11 0553) Resp:  [19] 19 (11/11 0553) BP: (146-150)/(83-88) 149/83 (11/11 0553) SpO2:  [94 %-100 %] 100 % (11/11 0553) Weight change:  Last BM Date: 11/13/17  Intake/Output from previous day: 11/10 0701 - 11/11 0700 In: 2294.3 [P.O.:1560; I.V.:500; IV Piggyback:234.3] Out: 4200 [Urine:4200]  PHYSICAL EXAM General appearance: alert, cooperative and no distress Resp: clear to auscultation bilaterally Cardio: regular rate and rhythm, S1, S2 normal, no murmur, click, rub or gallop GI: soft, non-tender; bowel sounds normal; no masses,  no organomegaly Extremities: extremities normal, atraumatic, no cyanosis or edema  Lab Results:  Results for orders placed or performed during the hospital encounter of 11/11/17 (from the past 48 hour(s))  Glucose, capillary     Status: Abnormal   Collection Time: 11/12/17 11:35 AM  Result Value Ref Range   Glucose-Capillary 103 (H) 70 - 99 mg/dL  Glucose, capillary     Status: Abnormal   Collection Time: 11/12/17  4:46 PM  Result Value Ref Range   Glucose-Capillary 167 (H) 70 - 99 mg/dL  Glucose, capillary     Status: Abnormal   Collection Time: 11/12/17  9:51 PM  Result Value Ref Range   Glucose-Capillary 118 (H) 70 - 99 mg/dL  Strep pneumoniae urinary antigen     Status: None   Collection Time: 11/12/17 10:50 PM  Result Value Ref Range   Strep Pneumo Urinary Antigen NEGATIVE NEGATIVE    Comment:        Infection due to S. pneumoniae cannot be absolutely ruled out since the antigen present may be below the detection limit of the test.   CBC with Differential/Platelet     Status: Abnormal   Collection Time: 11/13/17  6:42 AM  Result Value Ref Range   WBC 4.7 4.0 - 10.5 K/uL    RBC 3.35 (L) 3.87 - 5.11 MIL/uL   Hemoglobin 9.0 (L) 12.0 - 15.0 g/dL   HCT 32.0 (L) 36.0 - 46.0 %   MCV 95.5 80.0 - 100.0 fL   MCH 26.9 26.0 - 34.0 pg   MCHC 28.1 (L) 30.0 - 36.0 g/dL   RDW 17.4 (H) 11.5 - 15.5 %   Platelets 154 150 - 400 K/uL   nRBC 0.0 0.0 - 0.2 %   Neutrophils Relative % 55 %   Neutro Abs 2.6 1.7 - 7.7 K/uL   Lymphocytes Relative 37 %   Lymphs Abs 1.7 0.7 - 4.0 K/uL   Monocytes Relative 7 %   Monocytes Absolute 0.3 0.1 - 1.0 K/uL   Eosinophils Relative 1 %   Eosinophils Absolute 0.1 0.0 - 0.5 K/uL   Basophils Relative 0 %   Basophils Absolute 0.0 0.0 - 0.1 K/uL   Immature Granulocytes 0 %   Abs Immature Granulocytes 0.02 0.00 - 0.07 K/uL    Comment: Performed at [facility], [address], [place], [place] [address]  Comprehensive metabolic panel     Status: Abnormal   Collection Time: 11/13/17  6:42 AM  Result Value Ref Range   Sodium 145 135 - 145 mmol/L   Potassium 3.9 3.5 - 5.1 mmol/L    Comment: DELTA CHECK NOTED   Chloride 108 98 - 111 mmol/L   CO2 33 (H)  22 - 32 mmol/L   Glucose, Bld 101 (H) 70 - 99 mg/dL   BUN 9 6 - 20 mg/dL   Creatinine, Ser 0.49 0.44 - 1.00 mg/dL   Calcium 7.9 (L) 8.9 - 10.3 mg/dL   Total Protein 5.7 (L) 6.5 - 8.1 g/dL   Albumin 2.7 (L) 3.5 - 5.0 g/dL   AST 36 15 - 41 U/L   ALT 28 0 - 44 U/L   Alkaline Phosphatase 69 38 - 126 U/L   Total Bilirubin 0.5 0.3 - 1.2 mg/dL   GFR calc non Af Amer >60 >60 mL/min   GFR calc Af Amer >60 >60 mL/min    Comment: (NOTE) The eGFR has been calculated using the CKD EPI equation. This calculation has not been validated in all clinical situations. eGFR's persistently <60 mL/min signify possible Chronic Kidney Disease.    Anion gap 4 (L) 5 - 15    Comment: Performed at [facility], [address], [place], [place] [address]  Magnesium     Status: None   Collection Time: 11/13/17  6:42 AM  Result Value Ref Range   Magnesium 1.9 1.7 - 2.4 mg/dL    Comment: Performed at [facility], [address], [place], [place] [address]  Glucose, capillary     Status: None   Collection Time: 11/13/17  8:24 AM  Result Value Ref Range   Glucose-Capillary 87 70 - 99 mg/dL  Glucose, capillary     Status: Abnormal   Collection Time: 11/13/17 11:07 AM  Result Value Ref Range   Glucose-Capillary 176 (H) 70 - 99 mg/dL  Glucose, capillary     Status: Abnormal   Collection Time: 11/13/17  4:25 PM  Result Value Ref Range   Glucose-Capillary 132 (H) 70 - 99 mg/dL  Glucose, capillary     Status: Abnormal   Collection Time: 11/13/17  9:23 PM  Result Value Ref Range   Glucose-Capillary 146 (H) 70 - 99 mg/dL   Comment 1 Notify RN    Comment 2 Document in Chart   Glucose, capillary     Status: Abnormal   Collection Time: 11/14/17  7:32 AM  Result Value Ref Range   Glucose-Capillary 119 (H) 70 - 99 mg/dL   Comment 1 Notify RN    Comment 2 Document in Chart     ABGS No results for input(s): PHART, PO2ART, TCO2, HCO3 in the last 72 hours.  Invalid input(s): PCO2 CULTURES Recent Results (from the past 240 hour(s))  Culture, blood (routine x 2) Call MD if unable to obtain prior to antibiotics being given     Status: None (Preliminary result)   Collection Time: 11/11/17  8:45 PM  Result Value Ref Range Status   Specimen Description   Final    BLOOD BLOOD RIGHT HAND BOTTLES DRAWN AEROBIC AND ANAEROBIC   Special Requests Blood Culture adequate volume  Final   Culture   Final    NO GROWTH 3 DAYS Performed at [facility], [address] [place], [place] [address]    Report Status PENDING  Incomplete  Culture, blood (routine x 2) Call MD if unable to obtain prior to antibiotics being given     Status: Abnormal (Preliminary result)   Collection Time: 11/11/17  8:48 PM  Result Value Ref Range Status   Specimen Description   Final    BLOOD LEFT ANTECUBITAL BOTTLES DRAWN AEROBIC AND ANAEROBIC Performed at [facility], [address], [place], [place] [address]    Special  Requests    Final    Blood Culture results may not be optimal due to an excessive volume of blood received in culture bottles Performed at [facility], [address], [place], [place] [address]    Culture  Setup Time   Final    GRAM POSITIVE COCCI Gram Stain Report Called to,Read Back By and Verified With: BULLINS,L. AT 1616 ON 11/12/2017 BY EVA AEROBIC BOTTLE ONLY Performed at [place], READ BACK BY AND VERIFIED WITH: RN LISA [place] [phone] [place] Performed at [facility], [place] [address] [place], [place] [address]    Culture STAPHYLOCOCCUS SPECIES (COAGULASE NEGATIVE) (A)  Final   Report Status PENDING  Incomplete  Blood Culture ID Panel (Reflexed)     Status: Abnormal   Collection Time: 11/11/17  8:48 PM  Result Value Ref Range Status   Enterococcus species NOT DETECTED NOT DETECTED Final   Listeria monocytogenes NOT DETECTED NOT DETECTED Final   Staphylococcus species DETECTED (A) NOT DETECTED Final    Comment: Methicillin (oxacillin) susceptible coagulase negative staphylococcus. Possible blood culture contaminant (unless isolated from more than one blood culture draw or clinical case suggests pathogenicity). No antibiotic treatment is indicated for blood  culture contaminants. CRITICAL RESULT CALLED TO, READ BACK BY AND VERIFIED WITH: RN LISA [place] [phone] [facility]    Staphylococcus aureus (BCID) NOT DETECTED NOT DETECTED Final   Methicillin resistance NOT DETECTED NOT DETECTED Final   Streptococcus species NOT DETECTED NOT DETECTED Final   Streptococcus agalactiae NOT DETECTED NOT DETECTED Final   Streptococcus pneumoniae NOT DETECTED NOT DETECTED Final   Streptococcus pyogenes NOT DETECTED NOT DETECTED Final   Acinetobacter baumannii NOT DETECTED NOT DETECTED Final   Enterobacteriaceae species NOT DETECTED NOT DETECTED Final   Enterobacter cloacae complex NOT DETECTED NOT DETECTED Final   Escherichia coli NOT DETECTED NOT DETECTED Final    Klebsiella oxytoca NOT DETECTED NOT DETECTED Final   Klebsiella pneumoniae NOT DETECTED NOT DETECTED Final   Proteus species NOT DETECTED NOT DETECTED Final   Serratia marcescens NOT DETECTED NOT DETECTED Final   Haemophilus influenzae NOT DETECTED NOT DETECTED Final   Neisseria meningitidis NOT DETECTED NOT DETECTED Final   Pseudomonas aeruginosa NOT DETECTED NOT DETECTED Final   Candida albicans NOT DETECTED NOT DETECTED Final   Candida glabrata NOT DETECTED NOT DETECTED Final   Candida krusei NOT DETECTED NOT DETECTED Final   Candida parapsilosis NOT DETECTED NOT DETECTED Final   Candida tropicalis NOT DETECTED NOT DETECTED Final    Comment: Performed at [facility], [address] [place], [place] [address]  MRSA PCR Screening     Status: None   Collection Time: 11/11/17  9:11 PM  Result Value Ref Range Status   MRSA by PCR NEGATIVE NEGATIVE Final    Comment:        The GeneXpert MRSA Assay (FDA approved for NASAL specimens only), is one component of a comprehensive MRSA colonization surveillance program. It is not intended to diagnose MRSA infection nor to guide or monitor treatment for MRSA infections. Performed at [facility], [address], [place], [place] [address]    Studies/Results: No results found.  Medications:  Prior to Admission:  Medications Prior to Admission  Medication Sig Dispense Refill Last Dose  . albuterol (PROVENTIL HFA;VENTOLIN HFA) 108 (90 BASE) MCG/ACT inhaler Inhale 2 puffs into the lungs every 6 (six) hours as needed for wheezing or shortness of breath.    11/10/2017  at Unknown time  . aspirin 81 MG chewable tablet Chew 81 mg 2 (two) times a week by mouth.    11/10/2017 at Unknown time  . atorvastatin (LIPITOR) 40 MG tablet Take 40 mg by mouth every morning.    11/10/2017 at Unknown time  . BIOTIN PO Take 1 tablet 3 (three) times a week by mouth.   11/10/2017 at Unknown time  . cetirizine (ZYRTEC) 10 MG tablet Take 10 mg daily by mouth.     11/10/2017 at Unknown time  . CHANTIX 1 MG tablet Take 1 tablet by mouth 2 (two) times daily.   11/11/2017 at Unknown time  . clonazePAM (KLONOPIN) 1 MG tablet Take 0.5-2 mg 3 (three) times daily by mouth. Takes 0.5  tablet in the morning and afternoon and  1 tablet at bedtime.   11/11/2017 at Unknown time  . doxepin (SINEQUAN) 100 MG capsule Take 100 mg by mouth at bedtime.   11/10/2017 at Unknown time  . HYDROcodone-acetaminophen (NORCO) 10-325 MG per tablet Take 1 tablet every 4 (four) hours by mouth.    Past Week at Unknown time  . ibuprofen (ADVIL,MOTRIN) 800 MG tablet Take 800 mg by mouth 3 (three) times daily.   11/10/2017 at Unknown time  . levothyroxine (SYNTHROID, LEVOTHROID) 75 MCG tablet Take 1 tablet (75 mcg total) by mouth daily before breakfast. 30 tablet 0 11/11/2017 at Unknown time  . lisinopril (PRINIVIL,ZESTRIL) 2.5 MG tablet Take 2.5 mg at bedtime by mouth.    11/11/2017 at Unknown time  . metFORMIN (GLUCOPHAGE) 500 MG tablet Take 1 tablet (500 mg total) by mouth 2 (two) times daily with a meal. 60 tablet 0 11/11/2017 at Unknown time  . mometasone (NASONEX) 50 MCG/ACT nasal spray Place 2 sprays into the nose daily as needed (for rhinitis).   Past Month at Unknown time  . naphazoline-glycerin (CLEAR EYES) 0.012-0.2 % SOLN Place 1-2 drops every 4 (four) hours as needed into both eyes for eye irritation.    Past Month at Unknown time  . omega-3 acid ethyl esters (LOVAZA) 1 g capsule Take 2 g 2 (two) times daily by mouth.    Past Month at Unknown time  . omeprazole (PRILOSEC) 40 MG capsule Take 1 capsule (40 mg total) by mouth daily. (Patient taking differently: Take 40 mg by mouth daily as needed (for acid reflux). ) 30 capsule 0 Past Month at Unknown time  . polyethylene glycol (MIRALAX / GLYCOLAX) packet Take 17 g by mouth daily as needed for mild constipation.   Past Month at Unknown time  . pregabalin (LYRICA) 300 MG capsule Take 1 capsule (300 mg total) by mouth 2 (two) times daily. 60  capsule 0 11/11/2017 at Unknown time  . rOPINIRole (REQUIP) 0.5 MG tablet Take 1 tablet (0.5 mg total) by mouth 3 (three) times daily. 90 tablet 0 11/11/2017 at Unknown time  . valACYclovir (VALTREX) 500 MG tablet Take 500 mg by mouth 2 (two) times daily as needed.    11/11/2017 at Unknown time  . lidocaine (XYLOCAINE) 5 % ointment Apply 1 application topically 4 (four) times daily as needed for mild pain.   Past Month at Unknown time  . predniSONE (DELTASONE) 20 MG tablet Take 3 PO QAM x3days, 2 PO QAM x3days, 1 PO QAM x3days (Patient not taking: Reported on 11/11/2017) 18 tablet 0 Not Taking at Unknown time   Scheduled: . aspirin  81 mg Oral Once per day on Mon Thu  . atorvastatin  40 mg Oral  [id number]  . clonazePAM  0.5 mg Oral QHS  . doxepin  100 mg Oral QHS  . enoxaparin (LOVENOX) injection  40 mg Subcutaneous Q24H  . fluticasone  1 spray Each Nare Daily  . ibuprofen  800 mg Oral TID  . insulin aspart  0-15 Units Subcutaneous TID WC  . insulin aspart  0-5 Units Subcutaneous QHS  . levothyroxine  75 mcg Oral QAC breakfast  . loratadine  10 mg Oral Daily  . pantoprazole  40 mg Oral Daily  . potassium chloride  40 mEq Oral BID  . rOPINIRole  0.5 mg Oral TID  . varenicline  1 mg Oral BID WC   Continuous: . sodium chloride Stopped (11/12/17 0229)  . sodium chloride 100 mL/hr at 11/14/17 0400  . ceFEPime (MAXIPIME) IV 1 g (11/14/17 0615)  . vancomycin 750 mg (11/14/17 0831)   TPN:SQZYTM chloride, acetaminophen, albuterol, lidocaine, traMADol  Assesment: She was admitted with acute hypoxic respiratory failure with pneumonia.  She has COPD at baseline.  She has improved.  She had positive blood culture for staph but it appears to be a contaminant.  She has diabetes which is doing okay despite steroids  Principal Problem:   Acute respiratory failure with hypoxia ([place]) Active Problems:   Hypothyroid   Diabetes mellitus ([place])   Hypokalemia   GERD (gastroesophageal reflux disease)   Right  middle lobe pneumonia ([place])    Plan: Discharge home today    LOS: 3 days   Lura Falor L 11/14/2017, 8:38 AM

## 2017-11-14 NOTE — Progress Notes (Signed)
IV removed, WNL. D.C instructions given to pt. Verbalized understanding. Pt spouse at bedside to transport home.  

## 2017-11-14 NOTE — Discharge Summary (Signed)
Physician Discharge Summary  Patient ID: Patricia Bass MRN: [id number] DOB/AGE: 1960/11/26 57 y.o. Primary Care Physician:Tri Chittick, Ramon Dredge, MD Admit date: 11/11/2017 Discharge date: 11/14/2017    Discharge Diagnoses:   Principal Problem:   Acute respiratory failure with hypoxia [facility]) Active Problems:   Hypothyroid   Diabetes mellitus (HCC)   Hypokalemia   GERD (gastroesophageal reflux disease)   Right middle lobe pneumonia (HCC) Chronic pain Anxiety  Allergies as of 11/14/2017      Reactions   Duragesic Disc Transdermal System [fentanyl] Nausea And Vomiting      Medication List    TAKE these medications   albuterol 108 (90 Base) MCG/ACT inhaler Commonly known as:  PROVENTIL HFA;VENTOLIN HFA Inhale 2 puffs into the lungs every 6 (six) hours as needed for wheezing or shortness of breath.   aspirin 81 MG chewable tablet Chew 81 mg 2 (two) times a week by mouth.   atorvastatin 40 MG tablet Commonly known as:  LIPITOR Take 40 mg by mouth every morning.   BIOTIN PO Take 1 tablet 3 (three) times a week by mouth.   cefUROXime 500 MG tablet Commonly known as:  CEFTIN Take 1 tablet (500 mg total) by mouth 2 (two) times daily with a meal.   cetirizine 10 MG tablet Commonly known as:  ZYRTEC Take 10 mg daily by mouth.   CHANTIX 1 MG tablet Generic drug:  varenicline Take 1 tablet by mouth 2 (two) times daily.   clonazePAM 1 MG tablet Commonly known as:  KLONOPIN Take 0.5-2 mg 3 (three) times daily by mouth. Takes 0.5  tablet in the morning and afternoon and  1 tablet at bedtime.   doxepin 100 MG capsule Commonly known as:  SINEQUAN Take 100 mg by mouth at bedtime.   HYDROcodone-acetaminophen 10-325 MG tablet Commonly known as:  NORCO Take 1 tablet every 4 (four) hours by mouth.   ibuprofen 800 MG tablet Commonly known as:  ADVIL,MOTRIN Take 800 mg by mouth 3 (three) times daily.   levothyroxine 75 MCG tablet Commonly known as:  SYNTHROID, LEVOTHROID Take 1  tablet (75 mcg total) by mouth daily before breakfast.   lidocaine 5 % ointment Commonly known as:  XYLOCAINE Apply 1 application topically 4 (four) times daily as needed for mild pain.   lisinopril 2.5 MG tablet Commonly known as:  PRINIVIL,ZESTRIL Take 2.5 mg at bedtime by mouth.   metFORMIN 500 MG tablet Commonly known as:  GLUCOPHAGE Take 1 tablet (500 mg total) by mouth 2 (two) times daily with a meal.   mometasone 50 MCG/ACT nasal spray Commonly known as:  NASONEX Place 2 sprays into the nose daily as needed (for rhinitis).   naphazoline-glycerin 0.012-0.2 % Soln Commonly known as:  CLEAR EYES REDNESS Place 1-2 drops every 4 (four) hours as needed into both eyes for eye irritation.   omega-3 acid ethyl esters 1 g capsule Commonly known as:  LOVAZA Take 2 g 2 (two) times daily by mouth.   omeprazole 40 MG capsule Commonly known as:  PRILOSEC Take 1 capsule (40 mg total) by mouth daily. What changed:    when to take this  reasons to take this   polyethylene glycol packet Commonly known as:  MIRALAX / GLYCOLAX Take 17 g by mouth daily as needed for mild constipation.   predniSONE 20 MG tablet Commonly known as:  DELTASONE Take 3 PO QAM x3days, 2 PO QAM x3days, 1 PO QAM x3days What changed:  Another medication with the same name was  added. Make sure you understand how and when to take each.   predniSONE 10 MG (21) Tbpk tablet Commonly known as:  STERAPRED UNI-PAK 21 TAB Take by package directions What changed:  You were already taking a medication with the same name, and this prescription was added. Make sure you understand how and when to take each.   pregabalin 300 MG capsule Commonly known as:  LYRICA Take 1 capsule (300 mg total) by mouth 2 (two) times daily.   rOPINIRole 0.5 MG tablet Commonly known as:  REQUIP Take 1 tablet (0.5 mg total) by mouth 3 (three) times daily.   valACYclovir 500 MG tablet Commonly known as:  VALTREX Take 500 mg by mouth 2  (two) times daily as needed.       Discharged Condition: Improved    Consults: None  Significant Diagnostic Studies: Dg Chest 2 View  Result Date: 11/11/2017 CLINICAL DATA:  57 y/o F; recent diagnosis of pneumonia. Productive cough, shortness of breath. EXAM: CHEST - 2 VIEW COMPARISON:  10/25/2017 chest radiograph. FINDINGS: Stable normal cardiac silhouette given projection and technique. Decrease in right middle lobe consolidation. Clear lungs. No pleural effusion or pneumothorax. No acute osseous abnormality is evident. IMPRESSION: Consolidation in the right middle lobe compatible with recurrent/residual pneumonia, decreased from prior chest radiographs. Electronically Signed   By: Mitzi Hansen M.D.   On: 11/11/2017 20:13   Dg Chest 2 View  Result Date: 10/25/2017 CLINICAL DATA:  Cough and fever EXAM: CHEST - 2 VIEW COMPARISON:  06/20/2016 FINDINGS: Streaky atelectasis or infiltrate at the left base. Ground-glass opacity and mild consolidation at the right middle lobe and and right base. Mild cardiomegaly. No pneumothorax. No significant pleural effusion. IMPRESSION: Ground-glass opacity and mild consolidation at the right middle lobe with streaky airspace disease at both bases concerning for multifocal pneumonia. Electronically Signed   By: Jasmine Pang M.D.   On: 10/25/2017 23:50    Lab Results: Basic Metabolic Panel: Recent Labs    11/12/17 0535 11/13/17 0642  NA 143 145  K 3.0* 3.9  CL 102 108  CO2 34* 33*  GLUCOSE 141* 101*  BUN 11 9  CREATININE 0.62 0.49  CALCIUM 8.2* 7.9*  MG 2.1 1.9   Liver Function Tests: Recent Labs    11/11/17 2000 11/13/17 0642  AST 43* 36  ALT 35 28  ALKPHOS 93 69  BILITOT 0.7 0.5  PROT 7.4 5.7*  ALBUMIN 3.5 2.7*     CBC: Recent Labs    11/11/17 2000 11/12/17 0535 11/13/17 0642  WBC 11.0* 8.1 4.7  NEUTROABS 8.0*  --  2.6  HGB 11.3* 10.1* 9.0*  HCT 38.0 34.3* 32.0*  MCV 93.1 94.2 95.5  PLT 205 169 154     Recent Results (from the past 240 hour(s))  Culture, blood (routine x 2) Call MD if unable to obtain prior to antibiotics being given     Status: None (Preliminary result)   Collection Time: 11/11/17  8:45 PM  Result Value Ref Range Status   Specimen Description   Final    BLOOD BLOOD RIGHT HAND BOTTLES DRAWN AEROBIC AND ANAEROBIC   Special Requests Blood Culture adequate volume  Final   Culture   Final    NO GROWTH 3 DAYS Performed at [facility], [address], [place], [place] [address]    Report Status PENDING  Incomplete  Culture, blood (routine x 2) Call MD if unable to obtain prior to antibiotics being given     Status:  Abnormal (Preliminary result)   Collection Time: 11/11/17  8:48 PM  Result Value Ref Range Status   Specimen Description   Final    BLOOD LEFT ANTECUBITAL BOTTLES DRAWN AEROBIC AND ANAEROBIC Performed at [facility], [address], [place], [place] [address]    Special Requests   Final    Blood Culture results may not be optimal due to an excessive volume of blood received in culture bottles Performed at [facility], [address], [place], [place] [address]    Culture  Setup Time   Final    GRAM POSITIVE COCCI Gram Stain Report Called to,Read Back By and Verified With: BULLINS,L. AT 1616 ON 11/12/2017 BY EVA AEROBIC BOTTLE ONLY Performed at [facility] CRITICAL RESULT CALLED TO, READ BACK BY AND VERIFIED WITH: RN LISA BULLINS 1919 [phone] FCP Performed at [facility], [address], [place], [place] [address]    Culture STAPHYLOCOCCUS SPECIES (COAGULASE NEGATIVE) (A)  Final   Report Status PENDING  Incomplete  Blood Culture ID Panel (Reflexed)     Status: Abnormal   Collection Time: 11/11/17  8:48 PM  Result Value Ref Range Status   Enterococcus species NOT DETECTED NOT DETECTED Final   Listeria monocytogenes NOT DETECTED NOT DETECTED Final   Staphylococcus species DETECTED (A) NOT DETECTED Final    Comment: Methicillin (oxacillin)  susceptible coagulase negative staphylococcus. Possible blood culture contaminant (unless isolated from more than one blood culture draw or clinical case suggests pathogenicity). No antibiotic treatment is indicated for blood  culture contaminants. CRITICAL RESULT CALLED TO, READ BACK BY AND VERIFIED WITH: RN LISA BULLINS 1919 [phone] FCP    Staphylococcus aureus (BCID) NOT DETECTED NOT DETECTED Final   Methicillin resistance NOT DETECTED NOT DETECTED Final   Streptococcus species NOT DETECTED NOT DETECTED Final   Streptococcus agalactiae NOT DETECTED NOT DETECTED Final   Streptococcus pneumoniae NOT DETECTED NOT DETECTED Final   Streptococcus pyogenes NOT DETECTED NOT DETECTED Final   Acinetobacter baumannii NOT DETECTED NOT DETECTED Final   Enterobacteriaceae species NOT DETECTED NOT DETECTED Final   Enterobacter cloacae complex NOT DETECTED NOT DETECTED Final   Escherichia coli NOT DETECTED NOT DETECTED Final   Klebsiella oxytoca NOT DETECTED NOT DETECTED Final   Klebsiella pneumoniae NOT DETECTED NOT DETECTED Final   Proteus species NOT DETECTED NOT DETECTED Final   Serratia marcescens NOT DETECTED NOT DETECTED Final   Haemophilus influenzae NOT DETECTED NOT DETECTED Final   Neisseria meningitidis NOT DETECTED NOT DETECTED Final   Pseudomonas aeruginosa NOT DETECTED NOT DETECTED Final   Candida albicans NOT DETECTED NOT DETECTED Final   Candida glabrata NOT DETECTED NOT DETECTED Final   Candida krusei NOT DETECTED NOT DETECTED Final   Candida parapsilosis NOT DETECTED NOT DETECTED Final   Candida tropicalis NOT DETECTED NOT DETECTED Final    Comment: Performed at [facility], [address] [place], [place] [address]  MRSA PCR Screening     Status: None   Collection Time: 11/11/17  9:11 PM  Result Value Ref Range Status   MRSA by PCR NEGATIVE NEGATIVE Final    Comment:        The GeneXpert MRSA Assay (FDA approved for NASAL specimens only), is one component of  a comprehensive MRSA colonization surveillance program. It is not intended to diagnose MRSA infection nor to guide or monitor treatment for MRSA infections. Performed at [facility], [address], [place], [place] [address]      Hospital Course: This  is a 57 year old who came to the emergency department with increasing shortness of breath.  She was found to have pneumonia COPD exacerbation and acute hypoxic respiratory failure.  She was initially somnolent which was felt to be related to medications and her acute illness and that resolved.  She had positive blood culture but that appears to be a contaminant.  She improved while she was hospitalized is back at baseline now and ready for discharge  Discharge Exam: Blood pressure (!) 149/83, pulse (!) 102, temperature (!) 97.4 F (36.3 C), temperature source Oral, resp. rate 19, height 5\' 1"  (1.549 m), weight 82.2 kg, SpO2 100 %. She is awake and alert.  Chest is clear.  Heart is regular  Disposition: Home      Signed: Ahmaya Ostermiller L   11/14/2017, 8:42 AM

## 2017-11-15 LAB — CULTURE, BLOOD (ROUTINE X 2)

## 2017-11-16 LAB — CULTURE, BLOOD (ROUTINE X 2)
Culture: NO GROWTH
SPECIAL REQUESTS: ADEQUATE

## 2017-11-22 DIAGNOSIS — J441 Chronic obstructive pulmonary disease with (acute) exacerbation: Secondary | ICD-10-CM | POA: Diagnosis not present

## 2017-12-13 ENCOUNTER — Other Ambulatory Visit (HOSPITAL_COMMUNITY): Payer: Self-pay | Admitting: Pulmonary Disease

## 2017-12-13 DIAGNOSIS — M545 Low back pain, unspecified: Secondary | ICD-10-CM

## 2017-12-13 DIAGNOSIS — G4733 Obstructive sleep apnea (adult) (pediatric): Secondary | ICD-10-CM | POA: Diagnosis not present

## 2017-12-13 DIAGNOSIS — J449 Chronic obstructive pulmonary disease, unspecified: Secondary | ICD-10-CM | POA: Diagnosis not present

## 2017-12-13 DIAGNOSIS — M542 Cervicalgia: Secondary | ICD-10-CM

## 2017-12-13 DIAGNOSIS — Z79891 Long term (current) use of opiate analgesic: Secondary | ICD-10-CM | POA: Diagnosis not present

## 2017-12-20 ENCOUNTER — Ambulatory Visit (HOSPITAL_COMMUNITY)
Admission: RE | Admit: 2017-12-20 | Discharge: 2017-12-20 | Disposition: A | Discharge status: Home / Self Care | Payer: Medicare Other | Source: Ambulatory Visit | Attending: Pulmonary Disease | Admitting: Pulmonary Disease

## 2017-12-20 DIAGNOSIS — M5116 Intervertebral disc disorders with radiculopathy, lumbar region: Secondary | ICD-10-CM | POA: Diagnosis not present

## 2017-12-20 DIAGNOSIS — M50222 Other cervical disc displacement at C5-C6 level: Secondary | ICD-10-CM | POA: Insufficient documentation

## 2017-12-20 DIAGNOSIS — M545 Low back pain, unspecified: Secondary | ICD-10-CM

## 2017-12-20 DIAGNOSIS — M48061 Spinal stenosis, lumbar region without neurogenic claudication: Secondary | ICD-10-CM | POA: Diagnosis not present

## 2017-12-20 DIAGNOSIS — M5136 Other intervertebral disc degeneration, lumbar region: Secondary | ICD-10-CM | POA: Insufficient documentation

## 2017-12-20 DIAGNOSIS — M5013 Cervical disc disorder with radiculopathy, cervicothoracic region: Secondary | ICD-10-CM | POA: Diagnosis not present

## 2017-12-20 DIAGNOSIS — M542 Cervicalgia: Secondary | ICD-10-CM | POA: Diagnosis present

## 2017-12-22 DIAGNOSIS — J441 Chronic obstructive pulmonary disease with (acute) exacerbation: Secondary | ICD-10-CM | POA: Diagnosis not present

## 2018-01-09 DIAGNOSIS — M542 Cervicalgia: Secondary | ICD-10-CM | POA: Diagnosis not present

## 2018-01-09 DIAGNOSIS — M545 Low back pain: Secondary | ICD-10-CM | POA: Diagnosis not present

## 2018-01-22 DIAGNOSIS — J441 Chronic obstructive pulmonary disease with (acute) exacerbation: Secondary | ICD-10-CM | POA: Diagnosis not present

## 2018-02-01 ENCOUNTER — Other Ambulatory Visit: Payer: Self-pay

## 2018-02-01 ENCOUNTER — Emergency Department (HOSPITAL_COMMUNITY): Payer: Medicare Other

## 2018-02-01 ENCOUNTER — Encounter (HOSPITAL_COMMUNITY): Payer: Self-pay | Admitting: Emergency Medicine

## 2018-02-01 ENCOUNTER — Emergency Department (HOSPITAL_COMMUNITY)
Admission: EM | Admit: 2018-02-01 | Discharge: 2018-02-02 | Disposition: A | Discharge status: Home / Self Care | Payer: Medicare Other | Attending: Emergency Medicine | Admitting: Emergency Medicine

## 2018-02-01 DIAGNOSIS — J209 Acute bronchitis, unspecified: Secondary | ICD-10-CM

## 2018-02-01 DIAGNOSIS — Z9049 Acquired absence of other specified parts of digestive tract: Secondary | ICD-10-CM | POA: Insufficient documentation

## 2018-02-01 DIAGNOSIS — J441 Chronic obstructive pulmonary disease with (acute) exacerbation: Secondary | ICD-10-CM | POA: Insufficient documentation

## 2018-02-01 DIAGNOSIS — I2510 Atherosclerotic heart disease of native coronary artery without angina pectoris: Secondary | ICD-10-CM | POA: Diagnosis not present

## 2018-02-01 DIAGNOSIS — R000 Tachycardia, unspecified: Secondary | ICD-10-CM | POA: Diagnosis not present

## 2018-02-01 DIAGNOSIS — E119 Type 2 diabetes mellitus without complications: Secondary | ICD-10-CM | POA: Diagnosis not present

## 2018-02-01 DIAGNOSIS — E039 Hypothyroidism, unspecified: Secondary | ICD-10-CM | POA: Insufficient documentation

## 2018-02-01 DIAGNOSIS — R05 Cough: Secondary | ICD-10-CM | POA: Diagnosis not present

## 2018-02-01 DIAGNOSIS — Z79899 Other long term (current) drug therapy: Secondary | ICD-10-CM | POA: Insufficient documentation

## 2018-02-01 DIAGNOSIS — Z7984 Long term (current) use of oral hypoglycemic drugs: Secondary | ICD-10-CM | POA: Diagnosis not present

## 2018-02-01 DIAGNOSIS — Z7982 Long term (current) use of aspirin: Secondary | ICD-10-CM | POA: Insufficient documentation

## 2018-02-01 DIAGNOSIS — F419 Anxiety disorder, unspecified: Secondary | ICD-10-CM | POA: Diagnosis not present

## 2018-02-01 DIAGNOSIS — F17210 Nicotine dependence, cigarettes, uncomplicated: Secondary | ICD-10-CM | POA: Diagnosis not present

## 2018-02-01 DIAGNOSIS — R0602 Shortness of breath: Secondary | ICD-10-CM | POA: Diagnosis not present

## 2018-02-01 MED ORDER — IPRATROPIUM-ALBUTEROL 0.5-2.5 (3) MG/3ML IN SOLN
3.0000 mL | Freq: Once | RESPIRATORY_TRACT | Status: AC
  Administered 2018-02-02: 3 mL via RESPIRATORY_TRACT
  Filled 2018-02-01: qty 3

## 2018-02-01 MED ORDER — METHYLPREDNISOLONE SODIUM SUCC 125 MG IJ SOLR
125.0000 mg | Freq: Once | INTRAMUSCULAR | Status: AC
  Administered 2018-02-02: 125 mg via INTRAVENOUS
  Filled 2018-02-01: qty 2

## 2018-02-01 NOTE — ED Notes (Signed)
Pt states she has had flu symptoms for 2 days. Pt states she wears 2 L oxygen nasal cannula at home. Placed pt on 2 L.

## 2018-02-01 NOTE — ED Provider Notes (Signed)
[facility] EMERGENCY DEPARTMENT Provider Note   CSN: [id number] Arrival date & time: 02/01/18  2004     History   Chief Complaint Chief Complaint  Patient presents with  . Generalized Body Aches    HPI Patricia Bass is a 58 y.o. female.  Patient is a 58 year old female with extensive past medical history including COPD, coronary artery disease, diabetes, obesity, anxiety.  She presents today for evaluation of chest congestion, cough, and shortness of breath.  This started several days ago and is worsening.  She states she has been diagnosed with pneumonia twice in the past 3 months, one time requiring hospitalization.  She denies fevers or chills.  She denies chest pain or leg swelling.  She does state that her cough is productive of yellow sputum.  She denies any ill contacts.  The history is provided by the patient.    Past Medical History:  Diagnosis Date  . Anemia   . Anxiety   . Arthritis   . Bulging disc   . Carpal tunnel syndrome   . Cervicalgia   . Chronic airway obstruction (HCC)   . Chronic back pain   . COPD (chronic obstructive pulmonary disease) (HCC)   . Coronary artery disease   . Diabetes mellitus (HCC) 01/11/2012  . GERD (gastroesophageal reflux disease)   . Headache(784.0)   . Hypothyroid   . Lumbago   . Migraine   . Myalgia and myositis, unspecified   . Neuropathy   . Pneumonia   . Restless leg syndrome   . Shortness of breath   . Sleep apnea    uses O2 at home. uses it at home all times and at night at 2 liters.    Patient Active Problem List   Diagnosis Date Noted  . Right middle lobe pneumonia (HCC) 11/11/2017  . Abdominal pain, epigastric 10/27/2016  . IDA (iron deficiency anemia) 10/27/2016  . GERD (gastroesophageal reflux disease) 10/27/2016  . Esophageal dysphagia 10/27/2016  . Acute respiratory failure with hypoxia (HCC) 07/08/2016  . Acute respiratory failure with hypercapnia (HCC) 07/08/2016  . Cellulitis 07/07/2016  .  Cellulitis of great toe 07/07/2016  . CAP (community acquired pneumonia) 06/20/2016  . Respiratory failure, acute and chronic (HCC) 06/20/2016  . Hypokalemia 06/20/2016  . Carpal tunnel syndrome   . Migraine   . Hypothyroid   . Myalgia and myositis, unspecified   . Lumbago   . Chronic airway obstruction (HCC)   . Cervicalgia   . Anxiety   . Diabetes mellitus (HCC) 01/11/2012  . S/P cubital tunnel release 04/14/2011  . Ulnar nerve compression 04/14/2011  . Pain in joint, forearm 04/14/2011    Past Surgical History:  Procedure Laterality Date  . BIOPSY  12/06/2016   Procedure: BIOPSY;  Surgeon: Corbin Adeourk, Robert M, MD;  [facility];  Service: Endoscopy;;  gastric   . BLADDER SUSPENSION    . CARDIAC CATHETERIZATION    . CHOLECYSTECTOMY    . COLONOSCOPY WITH PROPOFOL N/A 12/06/2016   Procedure: COLONOSCOPY WITH PROPOFOL  Formed stool could not do;  Surgeon: Corbin Adeourk, Robert M, MD;  Location: [facility];  Service: Endoscopy;  Laterality: N/A;  10:00am  . ESOPHAGOGASTRODUODENOSCOPY (EGD) WITH PROPOFOL N/A 12/06/2016   Procedure: ESOPHAGOGASTRODUODENOSCOPY (EGD) WITH PROPOFOL;  Surgeon: Corbin Adeourk, Robert M, MD;  Location: [facility];  Service: Endoscopy;  Laterality: N/A;  . Elease HashimotoMALONEY DILATION N/A 12/06/2016   Procedure: Elease HashimotoMALONEY DILATION;  Surgeon: Corbin Adeourk, Robert M, MD;  Location: [facility];  Service: Endoscopy;  Laterality: N/A;  . TUBAL LIGATION    . ULNAR NERVE TRANSPOSITION  04/01/2011   Procedure: ULNAR NERVE DECOMPRESSION/TRANSPOSITION;  Surgeon: Dominica SeverinWilliam Gramig, MD;  [facility];  Service: Orthopedics;  Laterality: Left;  ULNAR NERVE RELEASE WITH ANTERIOR TRANS POSITION REPAIR RECONSTRUCTION AS NECESSARY     OB History   No obstetric history on file.      Home Medications    Prior to Admission medications   Medication Sig Start Date End Date Taking? Authorizing Provider  albuterol (PROVENTIL HFA;VENTOLIN HFA) 108 (90 BASE) MCG/ACT inhaler Inhale 2 puffs into the  lungs every 6 (six) hours as needed for wheezing or shortness of breath.     [provider]  aspirin 81 MG chewable tablet Chew 81 mg 2 (two) times a week by mouth.     [provider]  atorvastatin (LIPITOR) 40 MG tablet Take 40 mg by mouth every morning.     [provider]  BIOTIN PO Take 1 tablet 3 (three) times a week by mouth.    [provider]  cefUROXime (CEFTIN) 500 MG tablet Take 1 tablet (500 mg total) by mouth 2 (two) times daily with a meal. 11/14/17   Kari BaarsHawkins, Edward, MD  cetirizine (ZYRTEC) 10 MG tablet Take 10 mg daily by mouth.     Kari BaarsHawkins, Edward, MD  CHANTIX 1 MG tablet Take 1 tablet by mouth 2 (two) times daily. 10/19/16   [provider]  clonazePAM (KLONOPIN) 1 MG tablet Take 0.5-2 mg 3 (three) times daily by mouth. Takes 0.5  tablet in the morning and afternoon and  1 tablet at bedtime.    [provider]  doxepin (SINEQUAN) 100 MG capsule Take 100 mg by mouth at bedtime.    [provider]  HYDROcodone-acetaminophen (NORCO) 10-325 MG per tablet Take 1 tablet every 4 (four) hours by mouth.     [provider]  ibuprofen (ADVIL,MOTRIN) 800 MG tablet Take 800 mg by mouth 3 (three) times daily.    [provider]  levothyroxine (SYNTHROID, LEVOTHROID) 75 MCG tablet Take 1 tablet (75 mcg total) by mouth daily before breakfast. 09/05/14   Benjiman CorePickering, Nathan, MD  lidocaine (XYLOCAINE) 5 % ointment Apply 1 application topically 4 (four) times daily as needed for mild pain.    [provider]  lisinopril (PRINIVIL,ZESTRIL) 2.5 MG tablet Take 2.5 mg at bedtime by mouth.     [provider]  metFORMIN (GLUCOPHAGE) 500 MG tablet Take 1 tablet (500 mg total) by mouth 2 (two) times daily with a meal. 09/05/14   Benjiman CorePickering, Nathan, MD  mometasone (NASONEX) 50 MCG/ACT nasal spray Place 2 sprays into the nose daily as needed (for rhinitis).    [provider]  naphazoline-glycerin (CLEAR  EYES) 0.012-0.2 % SOLN Place 1-2 drops every 4 (four) hours as needed into both eyes for eye irritation.     [provider]  omega-3 acid ethyl esters (LOVAZA) 1 g capsule Take 2 g 2 (two) times daily by mouth.     [provider]  omeprazole (PRILOSEC) 40 MG capsule Take 1 capsule (40 mg total) by mouth daily. Patient taking differently: Take 40 mg by mouth daily as needed (for acid reflux).  09/05/14   Benjiman CorePickering, Nathan, MD  polyethylene glycol [facility](MIRALAX / Ethelene HalGLYCOLAX) packet Take 17 g by mouth daily as needed for mild constipation.    [provider]  predniSONE (DELTASONE) 20 MG tablet Take 3 PO QAM x3days, 2 PO  QAM x3days, 1 PO QAM x3days Patient not taking: Reported on 11/11/2017 10/29/17   Standley Dakins L, MD  predniSONE (STERAPRED UNI-PAK 21 TAB) 10 MG (21) TBPK tablet Take by package directions 11/14/17   [name], MD  pregabalin (LYRICA) 300 MG capsule Take 1 capsule (300 mg total) by mouth 2 (two) times daily. 09/05/14   [name], MD  rOPINIRole (REQUIP) 0.5 MG tablet Take 1 tablet (0.5 mg total) by mouth 3 (three) times daily. 09/05/14   [name], MD  valACYclovir (VALTREX) 500 MG tablet Take 500 mg by mouth 2 (two) times daily as needed.     [name], MD    Family History Family History  Problem Relation Age of Onset  . Colon cancer Neg Hx   . Celiac disease Neg Hx     Social History Social History   Tobacco Use  . Smoking status: Current Every Day Smoker    Packs/day: 0.50    Years: 20.00    Pack years: 10.00    Types: Cigarettes, E-cigarettes  . Smokeless tobacco: Never Used  Substance Use Topics  . Alcohol use: No  . Drug use: Yes    Types: Hydrocodone, Marijuana    Comment: occ     Allergies   Duragesic disc transdermal system [fentanyl]   Review of Systems Review of Systems  All other systems reviewed and are negative.    Physical Exam Updated Vital Signs BP (!) 134/98 (BP Location: Right Arm)    Pulse (!) 112   Temp 99.1 F (37.3 C)   Resp 16   Ht 5\' 1"  (1.549 m)   Wt 81.6 kg   SpO2 97%   BMI 34.01 kg/m   Physical Exam Vitals signs and nursing note reviewed.  Constitutional:      General: She is not in acute distress.    Appearance: She is well-developed. She is not diaphoretic.  HENT:     Head: Normocephalic and atraumatic.  Neck:     Musculoskeletal: Normal range of motion and neck supple.  Cardiovascular:     Rate and Rhythm: Normal rate and regular rhythm.     Heart sounds: No murmur. No friction rub. No gallop.   Pulmonary:     Effort: Pulmonary effort is normal. No respiratory distress.     Breath sounds: Normal breath sounds. No wheezing.  Abdominal:     General: Bowel sounds are normal. There is no distension.     Palpations: Abdomen is soft.     Tenderness: There is no abdominal tenderness.  Musculoskeletal: Normal range of motion.        General: No swelling or tenderness.     Right lower leg: No edema.     Left lower leg: No edema.  Skin:    General: Skin is warm and dry.  Neurological:     Mental Status: She is alert and oriented to person, place, and time.      ED Treatments / Results  Labs (all labs ordered are listed, but only abnormal results are displayed) Labs Reviewed  BASIC METABOLIC PANEL  CBC WITH DIFFERENTIAL/PLATELET  BRAIN NATRIURETIC PEPTIDE  I-STAT TROPONIN, ED    EKG None  Radiology No results found.  Procedures Procedures (including critical care time)  Medications Ordered in ED Medications  ipratropium-albuterol (DUONEB) 0.5-2.5 (3) MG/3ML nebulizer solution 3 mL (has no administration in time range)  methylPREDNISolone sodium succinate (SOLU-MEDROL) 125 mg/2 mL injection 125 mg (has no administration in time range)  Initial Impression / Assessment and Plan / ED Course  I have reviewed the triage vital signs and the nursing notes.  Pertinent labs & imaging results that were available during my care of the  patient were reviewed by me and considered in my medical decision making (see chart for details).  Patient presenting with complaints of chest congestion, cough, and has history of COPD/pneumonia.  Her work-up today is essentially unremarkable.  There is no infiltrate noted.  As the patient has had 2 prior pneumonias in the past several months, she will be treated with antibiotics, steroids, and continued breathing treatments.  She is in no respiratory distress and oxygen saturations are adequate on 2 L nasal cannula.  She has oxygen at home which she wears as needed.  Final Clinical Impressions(s) / ED Diagnoses   Final diagnoses:  None    ED Discharge Orders    None       Geoffery Lyons, MD 02/02/18 [phone]

## 2018-02-01 NOTE — ED Triage Notes (Signed)
Pt c/o chills, fever, body aches and headache x 2 days.

## 2018-02-02 DIAGNOSIS — R05 Cough: Secondary | ICD-10-CM | POA: Diagnosis not present

## 2018-02-02 DIAGNOSIS — R0602 Shortness of breath: Secondary | ICD-10-CM | POA: Diagnosis not present

## 2018-02-02 DIAGNOSIS — J209 Acute bronchitis, unspecified: Secondary | ICD-10-CM | POA: Diagnosis not present

## 2018-02-02 LAB — BASIC METABOLIC PANEL
Anion gap: 14 (ref 5–15)
BUN: 8 mg/dL (ref 6–20)
CO2: 26 mmol/L (ref 22–32)
Calcium: 8.9 mg/dL (ref 8.9–10.3)
Chloride: 99 mmol/L (ref 98–111)
Creatinine, Ser: 0.63 mg/dL (ref 0.44–1.00)
GFR calc Af Amer: >60 mL/min (ref >60)
GFR calc non Af Amer: >60 mL/min (ref >60)
Glucose, Bld: 145 mg/dL — ABNORMAL HIGH (ref 70–99)
Potassium: 2.9 mmol/L — ABNORMAL LOW (ref 3.5–5.1)
Sodium: 139 mmol/L (ref 135–145)

## 2018-02-02 LAB — CBC WITH DIFFERENTIAL/PLATELET
Abs Immature Granulocytes: 0.02 10*3/uL (ref 0.00–0.07)
Basophils Absolute: 0 10*3/uL (ref 0.0–0.1)
Basophils Relative: 0 %
Eosinophils Absolute: 0 10*3/uL (ref 0.0–0.5)
Eosinophils Relative: 0 %
HCT: 38.1 % (ref 36.0–46.0)
Hemoglobin: 11.2 g/dL — ABNORMAL LOW (ref 12.0–15.0)
Immature Granulocytes: 0 %
Lymphocytes Relative: 17 %
Lymphs Abs: 0.9 10*3/uL (ref 0.7–4.0)
MCH: 26.6 pg (ref 26.0–34.0)
MCHC: 29.4 g/dL — ABNORMAL LOW (ref 30.0–36.0)
MCV: 90.5 fL (ref 80.0–100.0)
Monocytes Absolute: 0.4 10*3/uL (ref 0.1–1.0)
Monocytes Relative: 8 %
Neutro Abs: 3.9 10*3/uL (ref 1.7–7.7)
Neutrophils Relative %: 75 %
Platelets: 176 10*3/uL (ref 150–400)
RBC: 4.21 MIL/uL (ref 3.87–5.11)
RDW: 17.8 % — ABNORMAL HIGH (ref 11.5–15.5)
WBC: 5.3 10*3/uL (ref 4.0–10.5)
nRBC: 0 % (ref 0.0–0.2)

## 2018-02-02 LAB — I-STAT TROPONIN, ED: Troponin i, poc: 0 ng/mL (ref 0.00–0.08)

## 2018-02-02 LAB — BRAIN NATRIURETIC PEPTIDE: B Natriuretic Peptide: 33 pg/mL (ref 0.0–100.0)

## 2018-02-02 MED ORDER — PREDNISONE 10 MG PO TABS: 20.0000 mg | ORAL_TABLET | Freq: Two times a day (BID) | ORAL | 0 refills | Status: DC

## 2018-02-02 MED ORDER — AZITHROMYCIN 250 MG PO TABS
ORAL_TABLET | ORAL | Status: AC
  Filled 2018-02-02: qty 2

## 2018-02-02 MED ORDER — AZITHROMYCIN 250 MG PO TABS: 250.0000 mg | ORAL_TABLET | Freq: Every day | ORAL | 0 refills | Status: DC

## 2018-02-02 MED ORDER — AZITHROMYCIN 250 MG PO TABS
500.0000 mg | ORAL_TABLET | Freq: Once | ORAL | Status: AC
  Administered 2018-02-02: 500 mg via ORAL

## 2018-02-02 NOTE — Discharge Instructions (Signed)
Zithromax and prednisone as prescribed.  Continue use of your nebulizer every 4 hours as needed.  Return to the ER if symptoms significantly worsen or change.

## 2018-02-02 NOTE — ED Notes (Signed)
Pt wheeled to waiting room. Pt verbalized understanding of discharge instructions.   

## 2018-02-02 NOTE — ED Notes (Signed)
Patient transported to X-ray 

## 2018-03-02 ENCOUNTER — Other Ambulatory Visit: Payer: Self-pay

## 2018-03-02 NOTE — Patient Outreach (Signed)
[facility] Customer service manager [facility]) Care Management  03/02/2018  Patricia Bass Jul 10, 1960 [id number]    Medication Adherence call to Mrs. Patricia Bass patient receives a bubble pack every month from the pharmacy on all her medications.Mrs. Patricia Bass is showing past due under [place] [employer].   Patricia Bass CPhT Pharmacy Technician [facility] Care Management Direct Dial [phone]  Fax [phone] [email or web address]

## 2018-03-14 DIAGNOSIS — G4733 Obstructive sleep apnea (adult) (pediatric): Secondary | ICD-10-CM | POA: Diagnosis not present

## 2018-03-14 DIAGNOSIS — J449 Chronic obstructive pulmonary disease, unspecified: Secondary | ICD-10-CM | POA: Diagnosis not present

## 2018-03-14 DIAGNOSIS — J9611 Chronic respiratory failure with hypoxia: Secondary | ICD-10-CM | POA: Diagnosis not present

## 2018-03-23 DIAGNOSIS — J441 Chronic obstructive pulmonary disease with (acute) exacerbation: Secondary | ICD-10-CM | POA: Diagnosis not present

## 2018-04-23 DIAGNOSIS — J441 Chronic obstructive pulmonary disease with (acute) exacerbation: Secondary | ICD-10-CM | POA: Diagnosis not present

## 2018-04-25 DIAGNOSIS — J9611 Chronic respiratory failure with hypoxia: Secondary | ICD-10-CM | POA: Diagnosis not present

## 2018-04-25 DIAGNOSIS — M545 Low back pain: Secondary | ICD-10-CM | POA: Diagnosis not present

## 2018-04-25 DIAGNOSIS — E1165 Type 2 diabetes mellitus with hyperglycemia: Secondary | ICD-10-CM | POA: Diagnosis not present

## 2018-04-25 DIAGNOSIS — J449 Chronic obstructive pulmonary disease, unspecified: Secondary | ICD-10-CM | POA: Diagnosis not present

## 2018-04-26 DIAGNOSIS — J449 Chronic obstructive pulmonary disease, unspecified: Secondary | ICD-10-CM | POA: Diagnosis not present

## 2018-04-26 DIAGNOSIS — Z0000 Encounter for general adult medical examination without abnormal findings: Secondary | ICD-10-CM | POA: Diagnosis not present

## 2018-04-26 DIAGNOSIS — E119 Type 2 diabetes mellitus without complications: Secondary | ICD-10-CM | POA: Diagnosis not present

## 2018-04-26 DIAGNOSIS — M5136 Other intervertebral disc degeneration, lumbar region: Secondary | ICD-10-CM | POA: Diagnosis not present

## 2018-05-12 DIAGNOSIS — G4733 Obstructive sleep apnea (adult) (pediatric): Secondary | ICD-10-CM | POA: Diagnosis not present

## 2018-05-12 DIAGNOSIS — M545 Low back pain: Secondary | ICD-10-CM | POA: Diagnosis not present

## 2018-05-12 DIAGNOSIS — E119 Type 2 diabetes mellitus without complications: Secondary | ICD-10-CM | POA: Diagnosis not present

## 2018-05-23 DIAGNOSIS — M5106 Intervertebral disc disorders with myelopathy, lumbar region: Secondary | ICD-10-CM | POA: Diagnosis not present

## 2018-05-23 DIAGNOSIS — J441 Chronic obstructive pulmonary disease with (acute) exacerbation: Secondary | ICD-10-CM | POA: Diagnosis not present

## 2018-05-23 DIAGNOSIS — M5416 Radiculopathy, lumbar region: Secondary | ICD-10-CM | POA: Diagnosis not present

## 2018-05-23 DIAGNOSIS — M545 Low back pain: Secondary | ICD-10-CM | POA: Diagnosis not present

## 2018-05-30 DIAGNOSIS — G8929 Other chronic pain: Secondary | ICD-10-CM | POA: Diagnosis not present

## 2018-05-30 DIAGNOSIS — E1169 Type 2 diabetes mellitus with other specified complication: Secondary | ICD-10-CM | POA: Diagnosis not present

## 2018-05-30 DIAGNOSIS — M545 Low back pain: Secondary | ICD-10-CM | POA: Diagnosis not present

## 2018-05-30 DIAGNOSIS — M5416 Radiculopathy, lumbar region: Secondary | ICD-10-CM | POA: Diagnosis not present

## 2018-06-12 ENCOUNTER — Other Ambulatory Visit (HOSPITAL_COMMUNITY): Payer: Self-pay | Admitting: Obstetrics and Gynecology

## 2018-06-12 DIAGNOSIS — Z1231 Encounter for screening mammogram for malignant neoplasm of breast: Secondary | ICD-10-CM

## 2018-06-19 DIAGNOSIS — M545 Low back pain: Secondary | ICD-10-CM | POA: Diagnosis not present

## 2018-06-19 DIAGNOSIS — M5416 Radiculopathy, lumbar region: Secondary | ICD-10-CM | POA: Diagnosis not present

## 2018-06-23 DIAGNOSIS — J441 Chronic obstructive pulmonary disease with (acute) exacerbation: Secondary | ICD-10-CM | POA: Diagnosis not present

## 2018-06-27 DIAGNOSIS — M545 Low back pain: Secondary | ICD-10-CM | POA: Diagnosis not present

## 2018-06-27 DIAGNOSIS — G8929 Other chronic pain: Secondary | ICD-10-CM | POA: Diagnosis not present

## 2018-07-19 ENCOUNTER — Other Ambulatory Visit: Payer: Self-pay

## 2018-07-19 ENCOUNTER — Ambulatory Visit (HOSPITAL_COMMUNITY)
Admission: RE | Admit: 2018-07-19 | Discharge: 2018-07-19 | Disposition: A | Discharge status: Home / Self Care | Payer: Medicare Other | Source: Ambulatory Visit | Attending: Obstetrics and Gynecology | Admitting: Obstetrics and Gynecology

## 2018-07-19 DIAGNOSIS — Z1231 Encounter for screening mammogram for malignant neoplasm of breast: Secondary | ICD-10-CM | POA: Insufficient documentation

## 2018-07-23 DIAGNOSIS — J441 Chronic obstructive pulmonary disease with (acute) exacerbation: Secondary | ICD-10-CM | POA: Diagnosis not present

## 2018-07-26 DIAGNOSIS — M5106 Intervertebral disc disorders with myelopathy, lumbar region: Secondary | ICD-10-CM | POA: Diagnosis not present

## 2018-07-26 DIAGNOSIS — M5416 Radiculopathy, lumbar region: Secondary | ICD-10-CM | POA: Diagnosis not present

## 2018-07-26 DIAGNOSIS — M532X6 Spinal instabilities, lumbar region: Secondary | ICD-10-CM | POA: Diagnosis not present

## 2018-07-26 DIAGNOSIS — M4150 Other secondary scoliosis, site unspecified: Secondary | ICD-10-CM | POA: Diagnosis not present

## 2018-08-02 ENCOUNTER — Other Ambulatory Visit: Payer: Medicare Other | Admitting: Adult Health

## 2018-08-02 DIAGNOSIS — M5416 Radiculopathy, lumbar region: Secondary | ICD-10-CM | POA: Diagnosis not present

## 2018-10-02 ENCOUNTER — Emergency Department (HOSPITAL_COMMUNITY)
Admission: EM | Admit: 2018-10-02 | Discharge: 2018-10-02 | Disposition: A | Discharge status: Home / Self Care | Payer: Medicare Other | Attending: Emergency Medicine | Admitting: Emergency Medicine

## 2018-10-02 ENCOUNTER — Encounter (HOSPITAL_COMMUNITY): Payer: Self-pay

## 2018-10-02 ENCOUNTER — Other Ambulatory Visit: Payer: Self-pay

## 2018-10-02 ENCOUNTER — Emergency Department (HOSPITAL_COMMUNITY): Payer: Medicare Other

## 2018-10-02 DIAGNOSIS — Y939 Activity, unspecified: Secondary | ICD-10-CM | POA: Insufficient documentation

## 2018-10-02 DIAGNOSIS — Y929 Unspecified place or not applicable: Secondary | ICD-10-CM | POA: Insufficient documentation

## 2018-10-02 DIAGNOSIS — S42402A Unspecified fracture of lower end of left humerus, initial encounter for closed fracture: Secondary | ICD-10-CM | POA: Diagnosis not present

## 2018-10-02 DIAGNOSIS — E119 Type 2 diabetes mellitus without complications: Secondary | ICD-10-CM | POA: Insufficient documentation

## 2018-10-02 DIAGNOSIS — F17210 Nicotine dependence, cigarettes, uncomplicated: Secondary | ICD-10-CM | POA: Insufficient documentation

## 2018-10-02 DIAGNOSIS — W06XXXA Fall from bed, initial encounter: Secondary | ICD-10-CM | POA: Insufficient documentation

## 2018-10-02 DIAGNOSIS — Z7982 Long term (current) use of aspirin: Secondary | ICD-10-CM | POA: Insufficient documentation

## 2018-10-02 DIAGNOSIS — S4992XA Unspecified injury of left shoulder and upper arm, initial encounter: Secondary | ICD-10-CM | POA: Diagnosis present

## 2018-10-02 DIAGNOSIS — Y999 Unspecified external cause status: Secondary | ICD-10-CM | POA: Diagnosis not present

## 2018-10-02 DIAGNOSIS — J449 Chronic obstructive pulmonary disease, unspecified: Secondary | ICD-10-CM | POA: Diagnosis not present

## 2018-10-02 NOTE — ED Triage Notes (Signed)
Pt states she fell off the bed 2 nights ago. Pt denies LOC or hitting head. Pt with complaints of left elbow and left shoulder pain since fall.

## 2018-10-02 NOTE — ED Provider Notes (Signed)
[facility] EMERGENCY DEPARTMENT Provider Note   CSN: [id number] Arrival date & time: 10/02/18  1401     History   Chief Complaint Chief Complaint  Patient presents with  . Fall    HPI Patricia Bass is a 58 y.o. female.     The history is provided by the patient. No language interpreter was used.  Fall This is a new problem. The current episode started 2 days ago. The problem occurs constantly. The problem has not changed since onset.Nothing aggravates the symptoms. Nothing relieves the symptoms. She has tried nothing for the symptoms. The treatment provided no relief.   Pt reports she fell 2 days ago.  Pt reports she hit her elbow and shoulder.  Pt complains of continued pain in left elbow.  Past Medical History:  Diagnosis Date  . Anemia   . Anxiety   . Arthritis   . Bulging disc   . Carpal tunnel syndrome   . Cervicalgia   . Chronic airway obstruction (HCC)   . Chronic back pain   . COPD (chronic obstructive pulmonary disease) (HCC)   . Coronary artery disease   . Diabetes mellitus (HCC) 01/11/2012  . GERD (gastroesophageal reflux disease)   . Headache(784.0)   . Hypothyroid   . Lumbago   . Migraine   . Myalgia and myositis, unspecified   . Neuropathy   . Pneumonia   . Restless leg syndrome   . Shortness of breath   . Sleep apnea    uses O2 at home. uses it at home all times and at night at 2 liters.    Patient Active Problem List   Diagnosis Date Noted  . Right middle lobe pneumonia (HCC) 11/11/2017  . Abdominal pain, epigastric 10/27/2016  . IDA (iron deficiency anemia) 10/27/2016  . GERD (gastroesophageal reflux disease) 10/27/2016  . Esophageal dysphagia 10/27/2016  . Acute respiratory failure with hypoxia (HCC) 07/08/2016  . Acute respiratory failure with hypercapnia (HCC) 07/08/2016  . Cellulitis 07/07/2016  . Cellulitis of great toe 07/07/2016  . CAP (community acquired pneumonia) 06/20/2016  . Respiratory failure, acute and chronic (HCC)  06/20/2016  . Hypokalemia 06/20/2016  . Carpal tunnel syndrome   . Migraine   . Hypothyroid   . Myalgia and myositis, unspecified   . Lumbago   . Chronic airway obstruction (HCC)   . Cervicalgia   . Anxiety   . Diabetes mellitus (HCC) 01/11/2012  . S/P cubital tunnel release 04/14/2011  . Ulnar nerve compression 04/14/2011  . Pain in joint, forearm 04/14/2011    Past Surgical History:  Procedure Laterality Date  . BIOPSY  12/06/2016   Procedure: BIOPSY;  Surgeon: Corbin Ade, MD;  [facility];  Service: Endoscopy;;  gastric   . BLADDER SUSPENSION    . CARDIAC CATHETERIZATION    . CHOLECYSTECTOMY    . COLONOSCOPY WITH PROPOFOL N/A 12/06/2016   Procedure: COLONOSCOPY WITH PROPOFOL  Formed stool could not do;  Surgeon: Corbin Ade, MD;  [facility];  Service: Endoscopy;  Laterality: N/A;  10:00am  . ESOPHAGOGASTRODUODENOSCOPY (EGD) WITH PROPOFOL N/A 12/06/2016   Procedure: ESOPHAGOGASTRODUODENOSCOPY (EGD) WITH PROPOFOL;  Surgeon: Corbin Ade, MD;  [facility];  Service: Endoscopy;  Laterality: N/A;  . Elease Hashimoto DILATION N/A 12/06/2016   Procedure: Elease Hashimoto DILATION;  Surgeon: Corbin Ade, MD;  [facility];  Service: Endoscopy;  Laterality: N/A;  . TUBAL LIGATION    . ULNAR NERVE TRANSPOSITION  04/01/2011  Procedure: ULNAR NERVE DECOMPRESSION/TRANSPOSITION;  Surgeon: Roseanne Kaufman, MD;  Location: [place];  Service: Orthopedics;  Laterality: Left;  ULNAR NERVE RELEASE WITH ANTERIOR TRANS POSITION REPAIR RECONSTRUCTION AS NECESSARY     OB History   No obstetric history on file.      Home Medications    Prior to Admission medications   Medication Sig Start Date End Date Taking? Authorizing Provider  albuterol (PROVENTIL HFA;VENTOLIN HFA) 108 (90 BASE) MCG/ACT inhaler Inhale 2 puffs into the lungs every 6 (six) hours as needed for wheezing or shortness of breath.     [provider]  aspirin 81 MG chewable tablet  Chew 81 mg 2 (two) times a week by mouth.     [provider]  atorvastatin (LIPITOR) 40 MG tablet Take 40 mg by mouth every morning.     [provider]  azithromycin (ZITHROMAX) 250 MG tablet Take 1 tablet (250 mg total) by mouth daily. 02/02/18   Veryl Speak, MD  BIOTIN PO Take 1 tablet 3 (three) times a week by mouth.    [provider]  cefUROXime (CEFTIN) 500 MG tablet Take 1 tablet (500 mg total) by mouth 2 (two) times daily with a meal. 11/14/17   Sinda Du, MD  cetirizine (ZYRTEC) 10 MG tablet Take 10 mg daily by mouth.     Sinda Du, MD  CHANTIX 1 MG tablet Take 1 tablet by mouth 2 (two) times daily. 10/19/16   [provider]  clonazePAM (KLONOPIN) 1 MG tablet Take 0.5-2 mg 3 (three) times daily by mouth. Takes 0.5  tablet in the morning and afternoon and  1 tablet at bedtime.    [provider]  doxepin (SINEQUAN) 100 MG capsule Take 100 mg by mouth at bedtime.    [provider]  HYDROcodone-acetaminophen (NORCO) 10-325 MG per tablet Take 1 tablet every 4 (four) hours by mouth.     [provider]  ibuprofen (ADVIL,MOTRIN) 800 MG tablet Take 800 mg by mouth 3 (three) times daily.    [provider]  levothyroxine (SYNTHROID, LEVOTHROID) 75 MCG tablet Take 1 tablet (75 mcg total) by mouth daily before breakfast. 09/05/14   Davonna Belling, MD  lidocaine (XYLOCAINE) 5 % ointment Apply 1 application topically 4 (four) times daily as needed for mild pain.    [provider]  lisinopril (PRINIVIL,ZESTRIL) 2.5 MG tablet Take 2.5 mg at bedtime by mouth.     [provider]  metFORMIN (GLUCOPHAGE) 500 MG tablet Take 1 tablet (500 mg total) by mouth 2 (two) times daily with a meal. 09/05/14   Davonna Belling, MD  mometasone (NASONEX) 50 MCG/ACT nasal spray Place 2 sprays into the nose daily as needed (for rhinitis).    [provider]  naphazoline-glycerin (CLEAR EYES) 0.012-0.2 % SOLN  Place 1-2 drops every 4 (four) hours as needed into both eyes for eye irritation.     [provider]  omega-3 acid ethyl esters (LOVAZA) 1 g capsule Take 2 g 2 (two) times daily by mouth.     [provider]  omeprazole (PRILOSEC) 40 MG capsule Take 1 capsule (40 mg total) by mouth daily. Patient taking differently: Take 40 mg by mouth daily as needed (for acid reflux).  09/05/14   Davonna Belling, MD  polyethylene glycol [facility] Floria Raveling) packet Take 17 g by mouth daily as needed for mild constipation.    [provider]  predniSONE (DELTASONE) 10 MG tablet Take 2 tablets (20 mg total)  by mouth 2 (two) times daily. 02/02/18   Geoffery Lyons, MD  pregabalin (LYRICA) 300 MG capsule Take 1 capsule (300 mg total) by mouth 2 (two) times daily. 09/05/14   [name], MD  rOPINIRole (REQUIP) 0.5 MG tablet Take 1 tablet (0.5 mg total) by mouth 3 (three) times daily. 09/05/14   [name], MD  valACYclovir (VALTREX) 500 MG tablet Take 500 mg by mouth 2 (two) times daily as needed.     [name], MD    Family History Family History  Problem Relation Age of Onset  . Colon cancer Neg Hx   . Celiac disease Neg Hx     Social History Social History   Tobacco Use  . Smoking status: Current Every Day Smoker    Packs/day: 0.50    Years: 20.00    Pack years: 10.00    Types: Cigarettes, E-cigarettes  . Smokeless tobacco: Never Used  Substance Use Topics  . Alcohol use: No  . Drug use: Not Currently    Types: Hydrocodone, Marijuana    Comment: occ     Allergies   Duragesic disc transdermal system [fentanyl]   Review of Systems Review of Systems  Musculoskeletal: Positive for arthralgias.  All other systems reviewed and are negative.    Physical Exam Updated Vital Signs BP 112/83 (BP Location: Right Arm)   Pulse 97   Temp 98.2 F (36.8 C) (Oral)   Resp 18   Ht 5\' 1"  (1.549 m)   Wt 80.7 kg   SpO2 91%   BMI 33.63 kg/m   Physical  Exam Vitals signs and nursing note reviewed.  Constitutional:      Appearance: She is well-developed.  HENT:     Head: Normocephalic.  Neck:     Musculoskeletal: Normal range of motion.  Cardiovascular:     Rate and Rhythm: Normal rate.  Pulmonary:     Effort: Pulmonary effort is normal.  Abdominal:     General: There is no distension.  Musculoskeletal:        General: Swelling, tenderness and signs of injury present.     Comments: Tender left elbow and left shoulder, swelling elbow   Skin:    General: Skin is warm.  Neurological:     Mental Status: She is alert and oriented to person, place, and time.  Psychiatric:        Mood and Affect: Mood normal.      ED Treatments / Results  Labs (all labs ordered are listed, but only abnormal results are displayed) Labs Reviewed - No data to display  EKG None  Radiology Dg Elbow Complete Left  Result Date: 10/02/2018 CLINICAL DATA:  Fall from bed 2 days ago with persistent elbow pain, initial encounter EXAM: LEFT ELBOW - COMPLETE 3+ VIEW COMPARISON:  None. FINDINGS: Mild joint effusion is identified. There is an avulsion fracture identified from coronoid process. No other fracture is seen. IMPRESSION: Coronoid process avulsion with associated joint effusion. No other focal abnormality is seen. Electronically Signed   By: 10/04/2018 M.D.   On: 10/02/2018 15:15   Dg Shoulder Left  Result Date: 10/02/2018 CLINICAL DATA:  Fall 2 days ago with persistent shoulder pain, initial encounter EXAM: LEFT SHOULDER - 2+ VIEW COMPARISON:  None. FINDINGS: Mild degenerative changes of the acromioclavicular joint are seen. No acute fracture or dislocation is seen. The underlying bony thorax is within normal limits. IMPRESSION: No acute abnormality noted. Electronically Signed   By: 10/04/2018.D.  On: 10/02/2018 15:15    Procedures Procedures (including critical care time)  Medications Ordered in ED Medications - No data to  display   Initial Impression / Assessment and Plan / ED Course  I have reviewed the triage vital signs and the nursing notes.  Pertinent labs & imaging results that were available during my care of the patient were reviewed by me and considered in my medical decision making (see chart for details).        MDM Results reviewed and discussed with pt.  Pt placed in a splint and sling.   Pt advised to follow up with Dr. Romeo AppleHarrison for recheck in 1 week.   Final Clinical Impressions(s) / ED Diagnoses   Final diagnoses:  Closed fracture of left elbow, initial encounter    ED Discharge Orders    None    An After Visit Summary was printed and given to the patient.    Elson AreasSofia, Leslie K, Cordelia Poche-C 10/02/18 1538    Geoffery Lyonselo, [place], MD 10/05/18 [phone]

## 2018-10-02 NOTE — Discharge Instructions (Addendum)
Return if any problems.

## 2018-10-06 ENCOUNTER — Ambulatory Visit: Payer: Medicare Other | Admitting: Orthopedic Surgery

## 2018-10-09 ENCOUNTER — Other Ambulatory Visit: Payer: Self-pay

## 2018-10-09 ENCOUNTER — Ambulatory Visit (INDEPENDENT_AMBULATORY_CARE_PROVIDER_SITE_OTHER): Payer: Medicare Other | Admitting: Orthopedic Surgery

## 2018-10-09 VITALS — BP 116/76 | HR 113 | Ht 61.0 in | Wt 173.0 lb

## 2018-10-09 DIAGNOSIS — S52045A Nondisplaced fracture of coronoid process of left ulna, initial encounter for closed fracture: Secondary | ICD-10-CM | POA: Diagnosis not present

## 2018-10-09 NOTE — Progress Notes (Signed)
Ashlye Oviedo  10/09/2018  HISTORY SECTION :  Chief Complaint  Patient presents with  . Elbow Injury    left elbow injury fall 09/28/2018   58 year old female just had back surgery went home tripped over something put her hand up to her head to prevent it from hitting a hearth injured her left elbow presents for evaluation and treatment of a suspected coronoid fracture complaining of left elbow pain  Associated with swelling no weakness  Injury date was September 24  Pain is over the left olecranon  Review of Systems  Constitutional: Negative for chills and fever.  Musculoskeletal: Positive for joint pain.  Neurological: Negative for tingling.     has a past medical history of Anemia, Anxiety, Arthritis, Bulging disc, Carpal tunnel syndrome, Cervicalgia, Chronic airway obstruction (HCC), Chronic back pain, COPD (chronic obstructive pulmonary disease) ([place]), Coronary artery disease, Diabetes mellitus ([place]) (01/11/2012), GERD (gastroesophageal reflux disease), Headache(784.0), Hypothyroid, Lumbago, Migraine, Myalgia and myositis, unspecified, Neuropathy, Pneumonia, Restless leg syndrome, Shortness of breath, and Sleep apnea.   Past Surgical History:  Procedure Laterality Date  . BIOPSY  12/06/2016   Procedure: BIOPSY;  Surgeon: Daneil Dolin, MD;  [facility];  Service: Endoscopy;;  gastric   . BLADDER SUSPENSION    . CARDIAC CATHETERIZATION    . CHOLECYSTECTOMY    . COLONOSCOPY WITH PROPOFOL N/A 12/06/2016   Procedure: COLONOSCOPY WITH PROPOFOL  Formed stool could not do;  Surgeon: Daneil Dolin, MD;  [facility];  Service: Endoscopy;  Laterality: N/A;  10:00am  . ESOPHAGOGASTRODUODENOSCOPY (EGD) WITH PROPOFOL N/A 12/06/2016   Procedure: ESOPHAGOGASTRODUODENOSCOPY (EGD) WITH PROPOFOL;  Surgeon: Daneil Dolin, MD;  [facility];  Service: Endoscopy;  Laterality: N/A;  . Venia Minks DILATION N/A 12/06/2016   Procedure: Venia Minks DILATION;  Surgeon:  Daneil Dolin, MD;  [facility];  Service: Endoscopy;  Laterality: N/A;  . TUBAL LIGATION    . ULNAR NERVE TRANSPOSITION  04/01/2011   Procedure: ULNAR NERVE DECOMPRESSION/TRANSPOSITION;  Surgeon: Roseanne Kaufman, MD;  Location: [place];  Service: Orthopedics;  Laterality: Left;  ULNAR NERVE RELEASE WITH ANTERIOR TRANS POSITION REPAIR RECONSTRUCTION AS NECESSARY    Body mass index is 32.69 kg/m.   Allergies  Allergen Reactions  . Duragesic Disc Transdermal System [Fentanyl] Nausea And Vomiting     Current Outpatient Medications:  .  albuterol (PROVENTIL HFA;VENTOLIN HFA) 108 (90 BASE) MCG/ACT inhaler, Inhale 2 puffs into the lungs every 6 (six) hours as needed for wheezing or shortness of breath. , Disp: , Rfl:  .  atorvastatin (LIPITOR) 40 MG tablet, Take 40 mg by mouth every morning. , Disp: , Rfl:  .  cefUROXime (CEFTIN) 500 MG tablet, Take 1 tablet (500 mg total) by mouth 2 (two) times daily with a meal., Disp: 14 tablet, Rfl: 0 .  cetirizine (ZYRTEC) 10 MG tablet, Take 10 mg daily by mouth. , Disp: , Rfl:  .  CHANTIX 1 MG tablet, Take 1 tablet by mouth 2 (two) times daily., Disp: , Rfl:  .  clonazePAM (KLONOPIN) 1 MG tablet, Take 0.5-2 mg 3 (three) times daily by mouth. Takes 0.5  tablet in the morning and afternoon and  1 tablet at bedtime., Disp: , Rfl:  .  doxepin (SINEQUAN) 100 MG capsule, Take 100 mg by mouth at bedtime., Disp: , Rfl:  .  HYDROcodone-acetaminophen (NORCO) 10-325 MG per tablet, Take 1 tablet every 4 (four) hours by mouth. , Disp: , Rfl:  .  ibuprofen (ADVIL,MOTRIN) 800 MG tablet, Take 800 mg by mouth 3 (three) times daily., Disp: , Rfl:  .  levothyroxine (SYNTHROID, LEVOTHROID) 75 MCG tablet, Take 1 tablet (75 mcg total) by mouth daily before breakfast., Disp: 30 tablet, Rfl: 0 .  lisinopril (PRINIVIL,ZESTRIL) 2.5 MG tablet, Take 2.5 mg at bedtime by mouth. , Disp: , Rfl:  .  metFORMIN (GLUCOPHAGE) 500 MG tablet, Take 1 tablet (500 mg total) by mouth 2  (two) times daily with a meal., Disp: 60 tablet, Rfl: 0 .  mometasone (NASONEX) 50 MCG/ACT nasal spray, Place 2 sprays into the nose daily as needed (for rhinitis)., Disp: , Rfl:  .  naphazoline-glycerin (CLEAR EYES) 0.012-0.2 % SOLN, Place 1-2 drops every 4 (four) hours as needed into both eyes for eye irritation. , Disp: , Rfl:  .  omeprazole (PRILOSEC) 40 MG capsule, Take 1 capsule (40 mg total) by mouth daily. (Patient taking differently: Take 40 mg by mouth daily as needed (for acid reflux). ), Disp: 30 capsule, Rfl: 0 .  Oxycodone HCl 10 MG TABS, Take 1 tablet by mouth every 4-6 hours as needed for pain, Disp: , Rfl:  .  polyethylene glycol (MIRALAX / GLYCOLAX) packet, Take 17 g by mouth daily as needed for mild constipation., Disp: , Rfl:  .  pregabalin (LYRICA) 300 MG capsule, Take 1 capsule (300 mg total) by mouth 2 (two) times daily., Disp: 60 capsule, Rfl: 0 .  rOPINIRole (REQUIP) 0.5 MG tablet, Take 1 tablet (0.5 mg total) by mouth 3 (three) times daily., Disp: 90 tablet, Rfl: 0 .  valACYclovir (VALTREX) 500 MG tablet, Take 500 mg by mouth 2 (two) times daily as needed. , Disp: , Rfl:  .  aspirin 81 MG chewable tablet, Chew 81 mg 2 (two) times a week by mouth. , Disp: , Rfl:  .  omega-3 acid ethyl esters (LOVAZA) 1 g capsule, Take 2 g 2 (two) times daily by mouth. , Disp: , Rfl:  .  predniSONE (DELTASONE) 10 MG tablet, Take 2 tablets (20 mg total) by mouth 2 (two) times daily. (Patient not taking: Reported on 10/09/2018), Disp: 20 tablet, Rfl: 0   PHYSICAL EXAM SECTION: 1) BP 116/76   Pulse (!) 113   Ht 5\' 1"  (1.549 m)   Wt 173 lb (78.5 kg)   BMI 32.69 kg/m   Body mass index is 32.69 kg/m. General appearance: Well-developed well-nourished no gross deformities  2) Cardiovascular normal pulse and perfusion in the upper  extremities normal color without edema  3) Neurologically deep tendon reflexes are equal and normal, no sensation loss or deficits no pathologic reflexes  4)  Psychological: Awake alert and oriented x3 mood and affect normal  5) Skin no lacerations or ulcerations no nodularity no palpable masses, no erythema or nodularity  6) Musculoskeletal:   Right arm wound looks normally aligned with no tenderness or malalignment range of motion appears normal  The left elbow is tender over the olecranon no swelling bruising or ecchymosis cubital fossa nontender full range of motion ligaments stable muscle tone normal   MEDICAL DECISION SECTION:  Encounter Diagnosis  Name Primary?  . Closed nondisplaced fracture of coronoid process of left ulna, initial encounter Yes    Imaging X-rays show abnormality on one view of the coronoid.  Questionable if it is really a fracture there.    Plan:  (Rx., Inj., surg., Frx, MRI/CT, XR:2)  Ace bandage support weight-bear as tolerated with the upper extremities  Follow-up in 4 weeks  check range of motion  4:50 PM Fuller Canada, MD  10/09/2018

## 2018-10-09 NOTE — Patient Instructions (Signed)
Normal activity    

## 2018-11-08 ENCOUNTER — Ambulatory Visit: Payer: Medicare Other | Admitting: Orthopedic Surgery

## 2018-11-08 ENCOUNTER — Telehealth: Payer: Self-pay | Admitting: Orthopedic Surgery

## 2018-11-08 NOTE — Telephone Encounter (Signed)
I called patient to follow up on appointment for today - states had incorrect date for appointment; thought it was tomorrow 11/09/18. Re-scheduling for next available, 11/27/18 - said she has taken the ace bandage off the elbow as it is feeling better. Any advice or recommendations?

## 2018-11-08 NOTE — Telephone Encounter (Signed)
No, keep next appointment

## 2018-11-09 NOTE — Telephone Encounter (Signed)
Called patient; aware. 

## 2018-11-27 ENCOUNTER — Ambulatory Visit: Payer: Medicare Other | Admitting: Orthopedic Surgery

## 2018-12-11 ENCOUNTER — Ambulatory Visit: Payer: Medicare Other | Admitting: Orthopedic Surgery

## 2018-12-18 ENCOUNTER — Other Ambulatory Visit: Payer: Self-pay

## 2018-12-18 ENCOUNTER — Ambulatory Visit: Payer: Medicare Other

## 2018-12-18 ENCOUNTER — Ambulatory Visit (INDEPENDENT_AMBULATORY_CARE_PROVIDER_SITE_OTHER): Payer: Medicare Other | Admitting: Orthopedic Surgery

## 2018-12-18 VITALS — BP 135/84 | HR 87 | Temp 96.6°F | Ht 61.0 in | Wt 175.0 lb

## 2018-12-18 DIAGNOSIS — S42402D Unspecified fracture of lower end of left humerus, subsequent encounter for fracture with routine healing: Secondary | ICD-10-CM

## 2018-12-18 NOTE — Progress Notes (Signed)
Chief Complaint  Patient presents with  . Follow-up    4 week recheck on left elbow, DOI 10-09-18.    57 year old female with suspected coronoid avulsion fracture status post fall  She is currently asymptomatic  X-rays today show that the area in question looks like it probably was an avulsion it looks like it healed okay the joint is normal  She is asymptomatic with full range of motion normal grip strength and neurovascular exam is intact  We released her from care today to follow-up with [place] if she has any other problems with this elbow  Encounter Diagnosis  Name Primary?  . Left elbow fracture, with routine healing, subsequent encounter Yes

## 2019-02-01 ENCOUNTER — Other Ambulatory Visit (HOSPITAL_COMMUNITY): Payer: Self-pay | Admitting: Orthopaedic Surgery

## 2019-02-01 ENCOUNTER — Other Ambulatory Visit: Payer: Self-pay | Admitting: Orthopaedic Surgery

## 2019-02-01 DIAGNOSIS — M542 Cervicalgia: Secondary | ICD-10-CM

## 2019-02-05 ENCOUNTER — Other Ambulatory Visit: Payer: Self-pay | Admitting: Family

## 2019-02-05 DIAGNOSIS — G2581 Restless legs syndrome: Secondary | ICD-10-CM

## 2019-02-08 ENCOUNTER — Ambulatory Visit (HOSPITAL_COMMUNITY)
Admission: RE | Admit: 2019-02-08 | Discharge: 2019-02-08 | Disposition: A | Discharge status: Home / Self Care | Payer: Medicare Other | Source: Ambulatory Visit | Attending: Orthopaedic Surgery | Admitting: Orthopaedic Surgery

## 2019-02-08 ENCOUNTER — Other Ambulatory Visit: Payer: Self-pay

## 2019-02-08 DIAGNOSIS — M542 Cervicalgia: Secondary | ICD-10-CM | POA: Insufficient documentation

## 2019-02-12 ENCOUNTER — Other Ambulatory Visit: Payer: Medicare Other

## 2019-03-12 ENCOUNTER — Encounter: Payer: Self-pay | Admitting: Surgery

## 2019-03-12 ENCOUNTER — Ambulatory Visit (HOSPITAL_COMMUNITY)
Admission: RE | Admit: 2019-03-12 | Discharge: 2019-03-12 | Disposition: A | Discharge status: Home / Self Care | Payer: Medicare Other | Source: Ambulatory Visit | Attending: Surgery | Admitting: Surgery

## 2019-03-12 ENCOUNTER — Other Ambulatory Visit: Payer: Self-pay

## 2019-03-12 ENCOUNTER — Ambulatory Visit (INDEPENDENT_AMBULATORY_CARE_PROVIDER_SITE_OTHER): Payer: Medicare Other | Admitting: Surgery

## 2019-03-12 VITALS — BP 87/65 | HR 109 | Temp 97.8°F | Resp 20 | Ht 61.0 in | Wt 173.0 lb

## 2019-03-12 DIAGNOSIS — G2581 Restless legs syndrome: Secondary | ICD-10-CM | POA: Diagnosis present

## 2019-03-12 DIAGNOSIS — M25562 Pain in left knee: Secondary | ICD-10-CM | POA: Diagnosis not present

## 2019-03-12 DIAGNOSIS — M25561 Pain in right knee: Secondary | ICD-10-CM | POA: Diagnosis not present

## 2019-03-12 NOTE — Progress Notes (Signed)
[facility] [place]  Patient name: Patricia Bass MRN: [id number] DOB: February 09, 1960 Sex: female   REQUESTING PROVIDER:    Dr. Tamala Julian   REASON FOR CONSULT:    RLS  HISTORY OF PRESENT ILLNESS:   Patricia Bass is a 59 y.o. female, who is referred for leg pain.  Patient has been diagnosed with restless leg syndrome.  She also has chronic back pain which affects her legs.  She is on a pain management program.  She states that she has to move her legs to keep them comfortable.  She does not endorse claudication or rest pain.  She does not have any open wounds.  Patient is a diabetic.  She has COPD.  She is a heavy smoker.  She used to smoke 4 packs a day but is cut back to 2.  She does wear oxygen.  She takes a statin for hypercholesterolemia.  She is medically managed for hypertension.  PAST MEDICAL HISTORY    Past Medical History:  Diagnosis Date  . Anemia   . Anxiety   . Arthritis   . Bulging disc   . Carpal tunnel syndrome   . Cervicalgia   . Chronic airway obstruction ([place])   . Chronic back pain   . COPD (chronic obstructive pulmonary disease) ([place])   . Coronary artery disease   . Diabetes mellitus ([place]) 01/11/2012  . GERD (gastroesophageal reflux disease)   . Headache(784.0)   . Hypothyroid   . Lumbago   . Migraine   . Myalgia and myositis, unspecified   . Neuropathy   . Pneumonia   . Restless leg syndrome   . Shortness of breath   . Sleep apnea    uses O2 at home. uses it at home all times and at night at 2 liters.     FAMILY HISTORY   Family History  Problem Relation Age of Onset  . Colon cancer Neg Hx   . Celiac disease Neg Hx     SOCIAL HISTORY:   Social History   Socioeconomic History  . Marital status: Divorced    Spouse name: Not on file  . Number of children: Not on file  . Years of education: Not on file  . Highest education level: Not on file  Occupational History  . Not on  file  Tobacco Use  . Smoking status: Current Every Day Smoker    Packs/day: 1.00    Years: 20.00    Pack years: 20.00    Types: Cigarettes, E-cigarettes  . Smokeless tobacco: Never Used  Substance and Sexual Activity  . Alcohol use: No  . Drug use: Yes    Types: Marijuana, Oxycodone    Comment: occ  . Sexual activity: Never  Other Topics Concern  . Not on file  Social History Narrative  . Not on file   Social Determinants of Health   Financial Resource Strain:   . Difficulty of Paying Living Expenses: Not on file  Food Insecurity:   . Worried About Charity fundraiser in the Last Year: Not on file  . Ran Out of Food in the Last Year: Not on file  Transportation Needs:   . Lack of Transportation (Medical): Not on file  . Lack of Transportation (Non-Medical): Not on file  Physical Activity:   . Days of Exercise per Week: Not on file  . Minutes of Exercise per Session: Not on file  Stress:   . Feeling of Stress : Not  on file  Social Connections:   . Frequency of Communication with Friends and Family: Not on file  . Frequency of Social Gatherings with Friends and Family: Not on file  . Attends Religious Services: Not on file  . Active Member of Clubs or Organizations: Not on file  . Attends Banker Meetings: Not on file  . Marital Status: Not on file  Intimate Partner Violence:   . Fear of Current or Ex-Partner: Not on file  . Emotionally Abused: Not on file  . Physically Abused: Not on file  . Sexually Abused: Not on file    ALLERGIES:    Allergies  Allergen Reactions  . Duragesic Disc Transdermal System [Fentanyl] Nausea And Vomiting    CURRENT MEDICATIONS:    Current Outpatient Medications  Medication Sig Dispense Refill  . albuterol (PROVENTIL HFA;VENTOLIN HFA) 108 (90 BASE) MCG/ACT inhaler Inhale 2 puffs into the lungs every 6 (six) hours as needed for wheezing or shortness of breath.     [name] aspirin 81 MG chewable tablet Chew 81 mg 2 (two)  times a week by mouth.     [name] atorvastatin (LIPITOR) 40 MG tablet Take 40 mg by mouth every morning.     . cetirizine (ZYRTEC) 10 MG tablet Take 10 mg daily by mouth.     . CHANTIX 1 MG tablet Take 1 tablet by mouth 2 (two) times daily.    . cilostazol (PLETAL) 50 MG tablet Take 50 mg by mouth 2 (two) times daily.    [name] doxepin (SINEQUAN) 100 MG capsule Take 100 mg by mouth at bedtime.    [name] ibuprofen (ADVIL,MOTRIN) 800 MG tablet Take 800 mg by mouth 3 (three) times daily.    [name] levothyroxine (SYNTHROID, LEVOTHROID) 75 MCG tablet Take 1 tablet (75 mcg total) by mouth daily before breakfast. 30 tablet 0  . lisinopril (PRINIVIL,ZESTRIL) 2.5 MG tablet Take 2.5 mg at bedtime by mouth.     . metFORMIN (GLUCOPHAGE) 500 MG tablet Take 1 tablet (500 mg total) by mouth 2 (two) times daily with a meal. 60 tablet 0  . mometasone (NASONEX) 50 MCG/ACT nasal spray Place 2 sprays into the nose daily as needed (for rhinitis).    . naphazoline-glycerin (CLEAR EYES) 0.012-0.2 % SOLN Place 1-2 drops every 4 (four) hours as needed into both eyes for eye irritation.     [name] NEUPRO 2 MG/24HR 1 patch at bedtime.    [name] omega-3 acid ethyl esters (LOVAZA) 1 g capsule Take 2 g 2 (two) times daily by mouth.     [name] omeprazole (PRILOSEC) 40 MG capsule Take 1 capsule (40 mg total) by mouth daily. (Patient taking differently: Take 40 mg by mouth daily as needed (for acid reflux). ) 30 capsule 0  . Oxycodone HCl 10 MG TABS Take 1 tablet by mouth every 4-6 hours as needed for pain    . polyethylene glycol (MIRALAX / GLYCOLAX) packet Take 17 g by mouth daily as needed for mild constipation.    . pregabalin (LYRICA) 300 MG capsule Take 1 capsule (300 mg total) by mouth 2 (two) times daily. 60 capsule 0  . tiZANidine (ZANAFLEX) 4 MG tablet Take 4 mg by mouth 3 (three) times daily.    . valACYclovir (VALTREX) 500 MG tablet Take 500 mg by mouth 2 (two) times daily as needed.      No current facility-administered medications for this visit.      REVIEW OF SYSTEMS:   [X]  denotes positive finding, [ ]   denotes negative finding Cardiac  Comments:  Chest pain or chest pressure:    Shortness of breath upon exertion: x   Short of breath when lying flat: x   Irregular heart rhythm:        Vascular    Pain in calf, thigh, or hip brought on by ambulation:    Pain in feet at night that wakes you up from your sleep:     Blood clot in your veins:    Leg swelling:         Pulmonary    Oxygen at home: x   Productive cough:     Wheezing:         Neurologic    Sudden weakness in arms or legs:     Sudden numbness in arms or legs:     Sudden onset of difficulty speaking or slurred speech:    Temporary loss of vision in one eye:     Problems with dizziness:         Gastrointestinal    Blood in stool:      Vomited blood:         Genitourinary    Burning when urinating:     Blood in urine:        Psychiatric    Major depression:         Hematologic    Bleeding problems:    Problems with blood clotting too easily:        Skin    Rashes or ulcers:        Constitutional    Fever or chills:     PHYSICAL EXAM:   Vitals:   03/12/19 1122  BP: (!) 87/65  Pulse: (!) 109  Resp: 20  Temp: 97.8 F (36.6 C)  SpO2: 94%  Weight: 173 lb (78.5 kg)  Height: 5\' 1"  (1.549 m)    GENERAL: The patient is a well-nourished female, in no acute distress. The vital signs are documented above. CARDIAC: There is a regular rate and rhythm.  VASCULAR: Palpable posterior tibial pulse bilaterally PULMONARY: Nonlabored respirations  MUSCULOSKELETAL: There are no major deformities or cyanosis. NEUROLOGIC: No focal weakness or paresthesias are detected. SKIN: There are no ulcers or rashes noted. PSYCHIATRIC: The patient has a normal affect.  STUDIES:   I have reviewed the following: +-------+-----------+-----------+------------+------------+  ABI/TBIToday's ABIToday's TBIPrevious ABIPrevious TBI   +-------+-----------+-----------+------------+------------+  Right 1.03    0.71                  +-------+-----------+-----------+------------+------------+  Left  1.13    0.79                  +-------+-----------+-----------+------------+------------+   Right Toe:  84 Left toe 94  ASSESSMENT and PLAN   I discussed with the patient that her leg pains are not vascular in etiology.  She has normal ABIs and palpable posterior tibial pulses bilaterally.  I did tell her that her toe pressures were within the normal range but on the lower side.  I think this is secondary to her diabetes.  I stressed the importance of monitoring and treating her blood sugar.  I congratulated her on cutting back from 4 packs to 1 pack a day of cigarettes, however told her that her battle is not over until she has completely quit.  We also stressed the importance of exercise.  [facility]    At this time, no vascular intervention  is recommended.  She will contact me if vascular assistance is required in the future.   Charlena Cross, MD, FACS [facility] [phone] Pager [phone]

## 2019-06-08 ENCOUNTER — Other Ambulatory Visit: Payer: Self-pay

## 2019-06-08 ENCOUNTER — Encounter (HOSPITAL_COMMUNITY): Payer: Self-pay

## 2019-06-08 ENCOUNTER — Emergency Department (HOSPITAL_COMMUNITY)
Admission: EM | Admit: 2019-06-08 | Discharge: 2019-06-09 | Discharge status: Against Medical Advice | Payer: Medicare Other | Attending: Emergency Medicine | Admitting: Emergency Medicine

## 2019-06-08 DIAGNOSIS — R519 Headache, unspecified: Secondary | ICD-10-CM | POA: Diagnosis not present

## 2019-06-08 DIAGNOSIS — Z5321 Procedure and treatment not carried out due to patient leaving prior to being seen by health care provider: Secondary | ICD-10-CM | POA: Diagnosis not present

## 2019-06-08 NOTE — ED Triage Notes (Addendum)
Pt arrives POV for eval of nose/face pain after a fall on Sunday. Pt reports her MD advised her to present here for CT scan of nose and head. Pt is not anticoagulated. Neuro intact.

## 2019-06-09 NOTE — ED Notes (Addendum)
Pt did answer called multiple times 3 times

## 2019-06-15 ENCOUNTER — Other Ambulatory Visit: Payer: Self-pay | Admitting: Family

## 2019-06-15 DIAGNOSIS — Z9181 History of falling: Secondary | ICD-10-CM

## 2019-06-15 DIAGNOSIS — R4182 Altered mental status, unspecified: Secondary | ICD-10-CM

## 2019-07-05 ENCOUNTER — Other Ambulatory Visit: Payer: Medicare Other

## 2019-09-11 ENCOUNTER — Other Ambulatory Visit (HOSPITAL_COMMUNITY): Payer: Self-pay | Admitting: Family

## 2019-09-11 DIAGNOSIS — Z1231 Encounter for screening mammogram for malignant neoplasm of breast: Secondary | ICD-10-CM

## 2019-09-19 ENCOUNTER — Ambulatory Visit (HOSPITAL_COMMUNITY): Payer: Medicare Other

## 2019-09-19 ENCOUNTER — Other Ambulatory Visit: Payer: Self-pay

## 2019-09-19 ENCOUNTER — Ambulatory Visit (HOSPITAL_COMMUNITY)
Admission: RE | Admit: 2019-09-19 | Discharge: 2019-09-19 | Disposition: A | Discharge status: Home / Self Care | Payer: Medicare Other | Source: Ambulatory Visit | Attending: Family | Admitting: Family

## 2019-09-19 DIAGNOSIS — Z1231 Encounter for screening mammogram for malignant neoplasm of breast: Secondary | ICD-10-CM | POA: Insufficient documentation

## 2020-02-19 ENCOUNTER — Other Ambulatory Visit: Payer: Self-pay | Admitting: Orthopaedic Surgery

## 2020-02-19 DIAGNOSIS — M50122 Cervical disc disorder at C5-C6 level with radiculopathy: Secondary | ICD-10-CM

## 2020-03-08 ENCOUNTER — Ambulatory Visit
Admission: RE | Admit: 2020-03-08 | Discharge: 2020-03-08 | Disposition: A | Discharge status: Home / Self Care | Payer: 59 | Source: Ambulatory Visit | Attending: Orthopaedic Surgery | Admitting: Orthopaedic Surgery

## 2020-03-08 ENCOUNTER — Other Ambulatory Visit: Payer: Self-pay

## 2020-03-08 DIAGNOSIS — M50122 Cervical disc disorder at C5-C6 level with radiculopathy: Secondary | ICD-10-CM

## 2020-03-12 ENCOUNTER — Other Ambulatory Visit: Payer: Medicare Other

## 2020-04-13 ENCOUNTER — Other Ambulatory Visit: Payer: Self-pay

## 2020-04-13 ENCOUNTER — Encounter (HOSPITAL_COMMUNITY): Payer: Self-pay | Admitting: Emergency Medicine

## 2020-04-13 ENCOUNTER — Emergency Department (HOSPITAL_COMMUNITY): Payer: Medicare Other

## 2020-04-13 ENCOUNTER — Emergency Department (HOSPITAL_COMMUNITY)
Admission: EM | Admit: 2020-04-13 | Discharge: 2020-04-13 | Disposition: A | Discharge status: Home / Self Care | Payer: Medicare Other | Attending: Emergency Medicine | Admitting: Emergency Medicine

## 2020-04-13 DIAGNOSIS — Z79899 Other long term (current) drug therapy: Secondary | ICD-10-CM | POA: Insufficient documentation

## 2020-04-13 DIAGNOSIS — M79675 Pain in left toe(s): Secondary | ICD-10-CM | POA: Diagnosis present

## 2020-04-13 DIAGNOSIS — F17210 Nicotine dependence, cigarettes, uncomplicated: Secondary | ICD-10-CM | POA: Diagnosis not present

## 2020-04-13 DIAGNOSIS — J449 Chronic obstructive pulmonary disease, unspecified: Secondary | ICD-10-CM | POA: Diagnosis not present

## 2020-04-13 DIAGNOSIS — Z7984 Long term (current) use of oral hypoglycemic drugs: Secondary | ICD-10-CM | POA: Insufficient documentation

## 2020-04-13 DIAGNOSIS — L03032 Cellulitis of left toe: Secondary | ICD-10-CM

## 2020-04-13 DIAGNOSIS — Z7982 Long term (current) use of aspirin: Secondary | ICD-10-CM | POA: Insufficient documentation

## 2020-04-13 DIAGNOSIS — E039 Hypothyroidism, unspecified: Secondary | ICD-10-CM | POA: Insufficient documentation

## 2020-04-13 DIAGNOSIS — L03116 Cellulitis of left lower limb: Secondary | ICD-10-CM | POA: Diagnosis not present

## 2020-04-13 DIAGNOSIS — E119 Type 2 diabetes mellitus without complications: Secondary | ICD-10-CM | POA: Diagnosis not present

## 2020-04-13 LAB — CBG MONITORING, ED: Glucose-Capillary: 128 mg/dL — ABNORMAL HIGH (ref 70–99)

## 2020-04-13 MED ORDER — DOXYCYCLINE HYCLATE 100 MG PO CAPS: 100.0000 mg | ORAL_CAPSULE | Freq: Two times a day (BID) | ORAL | 0 refills | Status: AC

## 2020-04-13 NOTE — ED Provider Notes (Signed)
[facility] EMERGENCY DEPARTMENT Provider Note   CSN: [id number] Arrival date & time: 04/13/20  1456     History Chief Complaint  Patient presents with  . Toe Pain    Patricia Bass is a 60 y.o. female.  HPI Patient is a 60 year old female who presents the emergency department with diffuse redness, pain, swelling in the left second toe.  She states that she had a small wound to the top of the toe that she noticed about 2 days ago and soaked it in peroxide.  Today she noticed that became more red and inflamed.  She reports mild pain in the toe but also notes that she takes oxycodone for chronic pain and believes that this provided moderate relief of her pain in the toe.  She has a history of type 2 diabetes mellitus that is controlled with Metformin.  She denies any fevers, chills, nausea, vomiting.  No other complaints.    Past Medical History:  Diagnosis Date  . Anemia   . Anxiety   . Arthritis   . Bulging disc   . Carpal tunnel syndrome   . Cervicalgia   . Chronic airway obstruction ([facility])   . Chronic back pain   . COPD (chronic obstructive pulmonary disease) ([facility])   . Coronary artery disease   . Diabetes mellitus ([facility]) 01/11/2012  . GERD (gastroesophageal reflux disease)   . Headache(784.0)   . Hypothyroid   . Lumbago   . Migraine   . Myalgia and myositis, unspecified   . Neuropathy   . Pneumonia   . Restless leg syndrome   . Shortness of breath   . Sleep apnea    uses O2 at home. uses it at home all times and at night at 2 liters.    Patient Active Problem List   Diagnosis Date Noted  . Right middle lobe pneumonia 11/11/2017  . Abdominal pain, epigastric 10/27/2016  . IDA (iron deficiency anemia) 10/27/2016  . GERD (gastroesophageal reflux disease) 10/27/2016  . Esophageal dysphagia 10/27/2016  . Acute respiratory failure with hypoxia ([facility]) 07/08/2016  . Acute respiratory failure with hypercapnia ([facility]) 07/08/2016  . Cellulitis 07/07/2016  . Cellulitis of  great toe 07/07/2016  . CAP (community acquired pneumonia) 06/20/2016  . Respiratory failure, acute and chronic ([facility]) 06/20/2016  . Hypokalemia 06/20/2016  . Carpal tunnel syndrome   . Migraine   . Hypothyroid   . Myalgia and myositis, unspecified   . Lumbago   . Chronic airway obstruction ([facility])   . Cervicalgia   . Anxiety   . Diabetes mellitus ([facility]) 01/11/2012  . S/P cubital tunnel release 04/14/2011  . Ulnar nerve compression 04/14/2011  . Pain in joint, forearm 04/14/2011    Past Surgical History:  Procedure Laterality Date  . BIOPSY  12/06/2016   Procedure: BIOPSY;  Surgeon: Corbin Adeourk, Robert M, MD;  Location: [facility];  Service: Endoscopy;;  gastric   . BLADDER SUSPENSION    . CARDIAC CATHETERIZATION    . CHOLECYSTECTOMY    . COLONOSCOPY WITH PROPOFOL N/A 12/06/2016   Procedure: COLONOSCOPY WITH PROPOFOL  Formed stool could not do;  Surgeon: Corbin Adeourk, Robert M, MD;  Location: [facility];  Service: Endoscopy;  Laterality: N/A;  10:00am  . ESOPHAGOGASTRODUODENOSCOPY (EGD) WITH PROPOFOL N/A 12/06/2016   Procedure: ESOPHAGOGASTRODUODENOSCOPY (EGD) WITH PROPOFOL;  Surgeon: Corbin Adeourk, Robert M, MD;  Location: [facility];  Service: Endoscopy;  Laterality: N/A;  . MALONEY DILATION N/A 12/06/2016   Procedure: Elease HashimotoMALONEY DILATION;  Surgeon: Jena Gaussourk,  Gerrit Friends, MD;  Location: [facility];  Service: Endoscopy;  Laterality: N/A;  . TUBAL LIGATION    . ULNAR NERVE TRANSPOSITION  04/01/2011   Procedure: ULNAR NERVE DECOMPRESSION/TRANSPOSITION;  Surgeon: Dominica Severin, MD;  Location: [facility] OR;  Service: Orthopedics;  Laterality: Left;  ULNAR NERVE RELEASE WITH ANTERIOR TRANS POSITION REPAIR RECONSTRUCTION AS NECESSARY     OB History   No obstetric history on file.     Family History  Problem Relation Age of Onset  . Colon cancer Neg Hx   . Celiac disease Neg Hx     Social History   Tobacco Use  . Smoking status: Current Every Day Smoker    Packs/day: 1.00    Years: 20.00    Pack  years: 20.00    Types: Cigarettes, E-cigarettes  . Smokeless tobacco: Never Used  Vaping Use  . Vaping Use: Never used  Substance Use Topics  . Alcohol use: No  . Drug use: Yes    Types: Marijuana, Oxycodone    Comment: last 2020    Home Medications Prior to Admission medications   Medication Sig Start Date End Date Taking? Authorizing Provider  doxycycline (VIBRAMYCIN) 100 MG capsule Take 1 capsule (100 mg total) by mouth 2 (two) times daily for 10 days. 04/13/20 04/23/20 Yes Placido Sou, PA-C  albuterol (PROVENTIL HFA;VENTOLIN HFA) 108 (90 BASE) MCG/ACT inhaler Inhale 2 puffs into the lungs every 6 (six) hours as needed for wheezing or shortness of breath.     [provider]  aspirin 81 MG chewable tablet Chew 81 mg 2 (two) times a week by mouth.     [provider]  atorvastatin (LIPITOR) 40 MG tablet Take 40 mg by mouth every morning.     [provider]  cetirizine (ZYRTEC) 10 MG tablet Take 10 mg daily by mouth.     [name], MD  CHANTIX 1 MG tablet Take 1 tablet by mouth 2 (two) times daily. 10/19/16   [provider]  cilostazol (PLETAL) 50 MG tablet Take 50 mg by mouth 2 (two) times daily. 01/23/19   [provider]  doxepin (SINEQUAN) 100 MG capsule Take 100 mg by mouth at bedtime.    [provider]  ibuprofen (ADVIL,MOTRIN) 800 MG tablet Take 800 mg by mouth 3 (three) times daily.    [provider]  levothyroxine (SYNTHROID, LEVOTHROID) 75 MCG tablet Take 1 tablet (75 mcg total) by mouth daily before breakfast. 09/05/14   [name], MD  lisinopril (PRINIVIL,ZESTRIL) 2.5 MG tablet Take 2.5 mg at bedtime by mouth.     [provider]  metFORMIN (GLUCOPHAGE) 500 MG tablet Take 1 tablet (500 mg total) by mouth 2 (two) times daily with a meal. 09/05/14   [name], MD  mometasone (NASONEX) 50 MCG/ACT nasal spray Place 2 sprays into the nose daily as needed (for rhinitis).    [provider]  naphazoline-glycerin (CLEAR EYES) 0.012-0.2 % SOLN Place 1-2 drops every 4 (four) hours as needed into both eyes for eye irritation.     [provider]  NEUPRO 2 MG/24HR 1 patch at bedtime. 02/07/19   [provider]  omega-3 acid ethyl esters (LOVAZA) 1 g capsule Take 2 g 2 (two) times daily by mouth.     [provider]  omeprazole (PRILOSEC) 40 MG capsule Take 1 capsule (40 mg total) by mouth daily. Patient taking differently: Take 40 mg by mouth daily as needed (for acid reflux).  09/05/14  [name], MD  Oxycodone HCl 10 MG TABS Take 1 tablet by mouth every 4-6 hours as needed for pain 09/27/18   [provider]  polyethylene glycol (MIRALAX / GLYCOLAX) packet Take 17 g by mouth daily as needed for mild constipation.    [provider]  pregabalin (LYRICA) 300 MG capsule Take 1 capsule (300 mg total) by mouth 2 (two) times daily. 09/05/14   [name], MD  tiZANidine (ZANAFLEX) 4 MG tablet Take 4 mg by mouth 3 (three) times daily. 03/05/19   [provider]  valACYclovir (VALTREX) 500 MG tablet Take 500 mg by mouth 2 (two) times daily as needed.     [name], MD    Allergies    Duragesic disc transdermal system [fentanyl]  Review of Systems   Review of Systems  Constitutional: Negative for chills and fever.  Gastrointestinal: Negative for nausea and vomiting.  Musculoskeletal: Positive for arthralgias, joint swelling and myalgias.  Skin: Positive for color change and wound.  Neurological: Negative for weakness and numbness.    Physical Exam Updated Vital Signs BP 100/76 (BP Location: Right Arm)   Pulse 92   Temp 97.8 F (36.6 C) (Oral)   Resp 16   Ht 5' (1.524 m)   Wt 76.2 kg   SpO2 91%   BMI 32.81 kg/m   Physical Exam Vitals and nursing note reviewed.  Constitutional:      General: She is not in acute distress.    Appearance: She is well-developed.  HENT:     Head: Normocephalic and  atraumatic.     Right Ear: External ear normal.     Left Ear: External ear normal.  Eyes:     General: No scleral icterus.       Right eye: No discharge.        Left eye: No discharge.     Conjunctiva/sclera: Conjunctivae normal.  Neck:     Trachea: No tracheal deviation.  Cardiovascular:     Rate and Rhythm: Normal rate.     Pulses: Normal pulses.  Pulmonary:     Effort: Pulmonary effort is normal. No respiratory distress.     Breath sounds: No stridor.  Abdominal:     General: There is no distension.  Musculoskeletal:        General: Swelling and tenderness present. No deformity.     Cervical back: Neck supple.     Comments: Mild swelling, erythema, tenderness appreciated diffusely along the left second toe.  Small healing wound noted to the dorsal aspect of the proximal toe.  Full range of motion of the toe.  Good cap refill.  Distal sensation intact.  Skin:    General: Skin is warm and dry.     Findings: Erythema present. No rash.  Neurological:     Mental Status: She is alert.     Cranial Nerves: Cranial nerve deficit: no gross deficits.    ED Results / Procedures / Treatments   Labs (all labs ordered are listed, but only abnormal results are displayed) Labs Reviewed  CBG MONITORING, ED - Abnormal; Notable for the following components:      Result Value   Glucose-Capillary 128 (*)    All other components within normal limits    EKG None  Radiology DG Foot Complete Left  Result Date: 04/13/2020 CLINICAL DATA:  Left 2nd toe pain, swelling and redness. History of diabetes. The patient also has 2nd metatarsal area pain and swelling with redness. EXAM: LEFT FOOT -  COMPLETE 3+ VIEW COMPARISON:  None. FINDINGS: Normal appearing bones.  No soft tissue swelling or gas seen. IMPRESSION: Normal examination.  No radiographic evidence of osteomyelitis. Electronically Signed   By: Beckie Salts M.D.   On: 04/13/2020 16:49   Procedures Procedures   Medications Ordered in  ED Medications - No data to display  ED Course  I have reviewed the triage vital signs and the nursing notes.  Pertinent labs & imaging results that were available during my care of the patient were reviewed by me and considered in my medical decision making (see chart for details).    MDM Rules/Calculators/A&P                          Patient is a 60 year old female who presents to the emergency department with what appears to be a developing cellulitis in the left second toe.  She has pain, swelling, redness in the toe.  Denies any systemic symptoms such as nausea, vomiting, fevers, chills.  No tachycardia.  Afebrile.  Her symptoms started in the past 24 hours.  She is a type II diabetic and currently takes Metformin.  CBG today is 128.  I obtained an x-ray of the left foot which was normal.  There was no radiographic evidence of osteomyelitis.  Patient is neurovascularly intact in the toe.  She has been seen by podiatry in the past.  We will start her on a course of doxycycline and recommended that she follow-up with podiatry this week so that she can have her toe reexamined.  Feel that she is stable for discharge at this time and she is agreeable.  Her questions were answered and she was amicable at the time of discharge.  Final Clinical Impression(s) / ED Diagnoses Final diagnoses:  Cellulitis of toe of left foot   Rx / DC Orders ED Discharge Orders         Ordered    doxycycline (VIBRAMYCIN) 100 MG capsule  2 times daily        04/13/20 1720           Placido Sou, PA-C 04/13/20 1724    Terald Sleeper, MD 04/14/20 1021

## 2020-04-13 NOTE — ED Provider Notes (Signed)
MSE was initiated and I personally evaluated the patient and placed orders (if any) at  4:06 PM on April 13, 2020.  Patient presents due to left second toe pain, swelling, and redness.  She states that she noticed that she had a small sore on the dorsal aspect of the toe about 2 days ago.  Today she noticed that it became red and started swelling.  She reports mild pain in the toe but also notes that she takes oxycodone for chronic pain and has been taking that during the day today which is probably alleviated her pain.  She has a history of type 2 diabetes mellitus and is currently taking Metformin.  No other complaints.  We will obtain an x-ray of the foot.  The patient appears stable so that the remainder of the MSE may be completed by another provider.   Placido Sou, PA-C 04/13/20 1606    Terald Sleeper, MD 04/14/20 1021

## 2020-04-13 NOTE — Discharge Instructions (Signed)
I am prescribing you an antibiotic called doxycycline.  This will help with the inflammation in your toe.  Please take this twice a day for the next 10 days.  Please follow-up with your podiatrist said you can schedule an appointment for reevaluation.  I have also given you a referral to another podiatrist, Dr. Nolen Mu, who is local.  Please give Dr. Nolen Mu a call if you are unable to schedule an appointment with your former podiatrist.  If you develop any new or worsening symptoms, please do not hesitate to return to the emergency department.  It was a pleasure to meet you.

## 2020-04-13 NOTE — ED Triage Notes (Signed)
Pt c/o left second toe pain with redness and swelling.  Pt has DM type 2.

## 2020-06-07 ENCOUNTER — Other Ambulatory Visit: Payer: Self-pay

## 2020-06-07 ENCOUNTER — Encounter (HOSPITAL_COMMUNITY): Payer: Self-pay | Admitting: Emergency Medicine

## 2020-06-07 DIAGNOSIS — Z79899 Other long term (current) drug therapy: Secondary | ICD-10-CM | POA: Diagnosis not present

## 2020-06-07 DIAGNOSIS — E1140 Type 2 diabetes mellitus with diabetic neuropathy, unspecified: Secondary | ICD-10-CM | POA: Diagnosis not present

## 2020-06-07 DIAGNOSIS — L97519 Non-pressure chronic ulcer of other part of right foot with unspecified severity: Secondary | ICD-10-CM | POA: Insufficient documentation

## 2020-06-07 DIAGNOSIS — E039 Hypothyroidism, unspecified: Secondary | ICD-10-CM | POA: Insufficient documentation

## 2020-06-07 DIAGNOSIS — J449 Chronic obstructive pulmonary disease, unspecified: Secondary | ICD-10-CM | POA: Insufficient documentation

## 2020-06-07 DIAGNOSIS — E11621 Type 2 diabetes mellitus with foot ulcer: Secondary | ICD-10-CM | POA: Diagnosis not present

## 2020-06-07 DIAGNOSIS — Z7982 Long term (current) use of aspirin: Secondary | ICD-10-CM | POA: Insufficient documentation

## 2020-06-07 DIAGNOSIS — Z7984 Long term (current) use of oral hypoglycemic drugs: Secondary | ICD-10-CM | POA: Insufficient documentation

## 2020-06-07 DIAGNOSIS — I2510 Atherosclerotic heart disease of native coronary artery without angina pectoris: Secondary | ICD-10-CM | POA: Insufficient documentation

## 2020-06-07 DIAGNOSIS — F17210 Nicotine dependence, cigarettes, uncomplicated: Secondary | ICD-10-CM | POA: Diagnosis not present

## 2020-06-07 NOTE — ED Triage Notes (Signed)
Pt c/o left big toe redness and swelling. Pt concerned for wound infection.

## 2020-06-08 ENCOUNTER — Emergency Department (HOSPITAL_COMMUNITY)
Admission: EM | Admit: 2020-06-08 | Discharge: 2020-06-08 | Disposition: A | Discharge status: Home / Self Care | Payer: Medicare Other | Attending: Emergency Medicine | Admitting: Emergency Medicine

## 2020-06-08 ENCOUNTER — Emergency Department (HOSPITAL_COMMUNITY): Payer: Medicare Other

## 2020-06-08 ENCOUNTER — Telehealth (HOSPITAL_COMMUNITY): Payer: Self-pay | Admitting: Emergency Medicine

## 2020-06-08 DIAGNOSIS — E11621 Type 2 diabetes mellitus with foot ulcer: Secondary | ICD-10-CM | POA: Diagnosis not present

## 2020-06-08 DIAGNOSIS — L97519 Non-pressure chronic ulcer of other part of right foot with unspecified severity: Secondary | ICD-10-CM

## 2020-06-08 MED ORDER — CEPHALEXIN 500 MG PO CAPS
1000.0000 mg | ORAL_CAPSULE | Freq: Once | ORAL | Status: AC
  Administered 2020-06-08: 1000 mg via ORAL
  Filled 2020-06-08: qty 2

## 2020-06-08 MED ORDER — CEPHALEXIN 500 MG PO CAPS: 500.0000 mg | ORAL_CAPSULE | Freq: Four times a day (QID) | ORAL | 0 refills | Status: DC

## 2020-06-08 NOTE — ED Provider Notes (Signed)
[facility] EMERGENCY DEPARTMENT Provider Note   CSN: [id number] Arrival date & time: 06/07/20  2141     History Chief Complaint  Patient presents with  . Toe Pain    Patricia Bass is a 60 y.o. female.  Right big toe redness that is slowly spread over last couple days.  She has had a infection of her left toe before similar to this.  She does not have a podiatrist.  She does have type 2 diabetes.  She has not tried any of the symptoms.  She has an ulcer on the bottom of the toe.  The history is provided by the patient.  Toe Pain This is a new problem. The current episode started more than 2 days ago. The problem occurs constantly. The problem has been gradually worsening. Pertinent negatives include no chest pain, no abdominal pain, no headaches and no shortness of breath. Nothing aggravates the symptoms. Nothing relieves the symptoms. She has tried nothing for the symptoms. The treatment provided no relief.       Past Medical History:  Diagnosis Date  . Anemia   . Anxiety   . Arthritis   . Bulging disc   . Carpal tunnel syndrome   . Cervicalgia   . Chronic airway obstruction (HCC)   . Chronic back pain   . COPD (chronic obstructive pulmonary disease) (HCC)   . Coronary artery disease   . Diabetes mellitus (HCC) 01/11/2012  . GERD (gastroesophageal reflux disease)   . Headache(784.0)   . Hypothyroid   . Lumbago   . Migraine   . Myalgia and myositis, unspecified   . Neuropathy   . Pneumonia   . Restless leg syndrome   . Shortness of breath   . Sleep apnea    uses O2 at home. uses it at home all times and at night at 2 liters.    Patient Active Problem List   Diagnosis Date Noted  . Right middle lobe pneumonia 11/11/2017  . Abdominal pain, epigastric 10/27/2016  . IDA (iron deficiency anemia) 10/27/2016  . GERD (gastroesophageal reflux disease) 10/27/2016  . Esophageal dysphagia 10/27/2016  . Acute respiratory failure with hypoxia (HCC) 07/08/2016  . Acute  respiratory failure with hypercapnia (HCC) 07/08/2016  . Cellulitis 07/07/2016  . Cellulitis of great toe 07/07/2016  . CAP (community acquired pneumonia) 06/20/2016  . Respiratory failure, acute and chronic (HCC) 06/20/2016  . Hypokalemia 06/20/2016  . Carpal tunnel syndrome   . Migraine   . Hypothyroid   . Myalgia and myositis, unspecified   . Lumbago   . Chronic airway obstruction (HCC)   . Cervicalgia   . Anxiety   . Diabetes mellitus (HCC) 01/11/2012  . S/P cubital tunnel release 04/14/2011  . Ulnar nerve compression 04/14/2011  . Pain in joint, forearm 04/14/2011    Past Surgical History:  Procedure Laterality Date  . BIOPSY  12/06/2016   Procedure: BIOPSY;  Surgeon: Corbin Ade, MD;  Location: [facility];  Service: Endoscopy;;  gastric   . BLADDER SUSPENSION    . CARDIAC CATHETERIZATION    . CHOLECYSTECTOMY    . COLONOSCOPY WITH PROPOFOL N/A 12/06/2016   Procedure: COLONOSCOPY WITH PROPOFOL  Formed stool could not do;  Surgeon: Corbin Ade, MD;  Location: [facility];  Service: Endoscopy;  Laterality: N/A;  10:00am  . ESOPHAGOGASTRODUODENOSCOPY (EGD) WITH PROPOFOL N/A 12/06/2016   Procedure: ESOPHAGOGASTRODUODENOSCOPY (EGD) WITH PROPOFOL;  Surgeon: Corbin Ade, MD;  Location: [facility];  Service:  Endoscopy;  Laterality: N/A;  . Elease HashimotoMALONEY DILATION N/A 12/06/2016   Procedure: Elease HashimotoMALONEY DILATION;  Surgeon: Corbin Adeourk, Robert M, MD;  Location: [facility];  Service: Endoscopy;  Laterality: N/A;  . TUBAL LIGATION    . ULNAR NERVE TRANSPOSITION  04/01/2011   Procedure: ULNAR NERVE DECOMPRESSION/TRANSPOSITION;  Surgeon: Dominica SeverinWilliam Gramig, MD;  [facility];  Service: Orthopedics;  Laterality: Left;  ULNAR NERVE RELEASE WITH ANTERIOR TRANS POSITION REPAIR RECONSTRUCTION AS NECESSARY     OB History   No obstetric history on file.     Family History  Problem Relation Age of Onset  . Colon cancer Neg Hx   . Celiac disease Neg Hx     Social History   Tobacco  Use  . Smoking status: Current Every Day Smoker    Packs/day: 1.00    Years: 20.00    Pack years: 20.00    Types: Cigarettes, E-cigarettes  . Smokeless tobacco: Never Used  Vaping Use  . Vaping Use: Never used  Substance Use Topics  . Alcohol use: No  . Drug use: Yes    Types: Marijuana, Oxycodone    Comment: last 2020    Home Medications Prior to Admission medications   Medication Sig Start Date End Date Taking? Authorizing Provider  cephALEXin (KEFLEX) 500 MG capsule Take 1 capsule (500 mg total) by mouth 4 (four) times daily. 06/08/20  Yes Daxson Reffett, Barbara CowerJason, MD  albuterol (PROVENTIL HFA;VENTOLIN HFA) 108 (90 BASE) MCG/ACT inhaler Inhale 2 puffs into the lungs every 6 (six) hours as needed for wheezing or shortness of breath.     [provider]  aspirin 81 MG chewable tablet Chew 81 mg 2 (two) times a week by mouth.     [provider]  atorvastatin (LIPITOR) 40 MG tablet Take 40 mg by mouth every morning.     [provider]  cetirizine (ZYRTEC) 10 MG tablet Take 10 mg daily by mouth.     Kari BaarsHawkins, Edward, MD  CHANTIX 1 MG tablet Take 1 tablet by mouth 2 (two) times daily. 10/19/16   [provider]  cilostazol (PLETAL) 50 MG tablet Take 50 mg by mouth 2 (two) times daily. 01/23/19   [provider]  doxepin (SINEQUAN) 100 MG capsule Take 100 mg by mouth at bedtime.    [provider]  ibuprofen (ADVIL,MOTRIN) 800 MG tablet Take 800 mg by mouth 3 (three) times daily.    [provider]  levothyroxine (SYNTHROID, LEVOTHROID) 75 MCG tablet Take 1 tablet (75 mcg total) by mouth daily before breakfast. 09/05/14   Benjiman CorePickering, Nathan, MD  lisinopril (PRINIVIL,ZESTRIL) 2.5 MG tablet Take 2.5 mg at bedtime by mouth.     [provider]  metFORMIN (GLUCOPHAGE) 500 MG tablet Take 1 tablet (500 mg total) by mouth 2 (two) times daily with a meal. 09/05/14   Benjiman CorePickering, Nathan, MD  mometasone (NASONEX) 50 MCG/ACT nasal spray Place 2  sprays into the nose daily as needed (for rhinitis).    [provider]  naphazoline-glycerin (CLEAR EYES) 0.012-0.2 % SOLN Place 1-2 drops every 4 (four) hours as needed into both eyes for eye irritation.     [provider]  NEUPRO 2 MG/24HR 1 patch at bedtime. 02/07/19   [provider]  omega-3 acid ethyl esters (LOVAZA) 1 g capsule Take 2 g 2 (two) times daily by mouth.     [provider]  omeprazole (PRILOSEC) 40 MG capsule Take 1 capsule (40 mg total) by mouth daily. Patient  taking differently: Take 40 mg by mouth daily as needed (for acid reflux).  09/05/14   [name], MD  Oxycodone HCl 10 MG TABS Take 1 tablet by mouth every 4-6 hours as needed for pain 09/27/18   [provider]  polyethylene glycol (MIRALAX / GLYCOLAX) packet Take 17 g by mouth daily as needed for mild constipation.    [provider]  pregabalin (LYRICA) 300 MG capsule Take 1 capsule (300 mg total) by mouth 2 (two) times daily. 09/05/14   [name], MD  tiZANidine (ZANAFLEX) 4 MG tablet Take 4 mg by mouth 3 (three) times daily. 03/05/19   [provider]  valACYclovir (VALTREX) 500 MG tablet Take 500 mg by mouth 2 (two) times daily as needed.     [name], MD    Allergies    Duragesic disc transdermal system [fentanyl]  Review of Systems   Review of Systems  Respiratory: Negative for shortness of breath.   Cardiovascular: Negative for chest pain.  Gastrointestinal: Negative for abdominal pain.  Neurological: Negative for headaches.  All other systems reviewed and are negative.   Physical Exam Updated Vital Signs BP 97/60 (BP Location: Right Arm)   Pulse 68   Temp 98.1 F (36.7 C) (Oral)   Resp 18   Ht 5\' 1"  (1.549 m)   Wt 70.8 kg   SpO2 99%   BMI 29.48 kg/m   Physical Exam Vitals and nursing note reviewed.  Constitutional:      Appearance: She is well-developed.  HENT:     Head: Normocephalic and atraumatic.      Mouth/Throat:     Mouth: Mucous membranes are moist.     Pharynx: Oropharynx is clear.  Eyes:     Pupils: Pupils are equal, round, and reactive to light.  Cardiovascular:     Rate and Rhythm: Normal rate and regular rhythm.  Pulmonary:     Effort: No respiratory distress.     Breath sounds: No stridor.  Abdominal:     General: Abdomen is flat. There is no distension.  Musculoskeletal:        General: Swelling present. No tenderness. Normal range of motion.     Cervical back: Normal range of motion.  Skin:    General: Skin is warm and dry.     Findings: Erythema (right big toe spreading up foot a bit, some edema, ulcer on plantar surface) present.  Neurological:     General: No focal deficit present.     Mental Status: She is alert.     ED Results / Procedures / Treatments   Labs (all labs ordered are listed, but only abnormal results are displayed) Labs Reviewed - No data to display  EKG None  Radiology DG Foot Complete Right  Result Date: 06/08/2020 CLINICAL DATA:  Great toe erythema and swelling EXAM: RIGHT FOOT COMPLETE - 3+ VIEW COMPARISON:  None. FINDINGS: There is soft tissue swelling of the a right great toe as well as the right forefoot. Soft tissue ulceration noted involving the right great toe distally. No acute fracture or dislocation. No osseous erosion. Joint spaces are preserved. No ankle effusion. IMPRESSION: Right great toe and forefoot soft tissue swelling. No superimposed osseous erosion to suggest osteomyelitis. Electronically Signed   By: 08/08/2020 MD   On: 06/08/2020 03:29    Procedures Procedures   Medications Ordered in ED Medications  cephALEXin (KEFLEX) capsule 1,000 mg (1,000 mg Oral Given 06/08/20 0239)    ED Course  I have reviewed the triage vital signs and the nursing notes.  Pertinent labs & imaging results that were available during my care of the patient were reviewed by me and considered in my medical decision making (see chart for  details).    MDM Rules/Calculators/A&P                          Diabetic foot ulcer with overlying infection. n9o e/o sepsis. Doubt osteo currently. abx started. Referral to podiatry for follow up for this and for follow up of general foot care. Final Clinical Impression(s) / ED Diagnoses Final diagnoses:  Diabetic ulcer of right great toe (HCC)    Rx / DC Orders ED Discharge Orders         Ordered    cephALEXin (KEFLEX) 500 MG capsule  4 times daily        06/08/20 0245           Shreshta Medley, Barbara Cower, MD 06/08/20 5852

## 2020-11-11 ENCOUNTER — Ambulatory Visit (INDEPENDENT_AMBULATORY_CARE_PROVIDER_SITE_OTHER): Payer: Medicare Other

## 2020-11-11 ENCOUNTER — Ambulatory Visit (INDEPENDENT_AMBULATORY_CARE_PROVIDER_SITE_OTHER): Payer: Medicare Other | Admitting: Podiatry

## 2020-11-11 ENCOUNTER — Other Ambulatory Visit: Payer: Self-pay

## 2020-11-11 DIAGNOSIS — L97512 Non-pressure chronic ulcer of other part of right foot with fat layer exposed: Secondary | ICD-10-CM | POA: Diagnosis not present

## 2020-11-11 DIAGNOSIS — M86171 Other acute osteomyelitis, right ankle and foot: Secondary | ICD-10-CM

## 2020-11-11 DIAGNOSIS — L97519 Non-pressure chronic ulcer of other part of right foot with unspecified severity: Secondary | ICD-10-CM

## 2020-11-11 DIAGNOSIS — M79674 Pain in right toe(s): Secondary | ICD-10-CM

## 2020-11-11 MED ORDER — DOXYCYCLINE HYCLATE 100 MG PO TABS: 100.0000 mg | ORAL_TABLET | Freq: Two times a day (BID) | ORAL | 0 refills | Status: DC

## 2020-11-12 ENCOUNTER — Ambulatory Visit (HOSPITAL_COMMUNITY)
Admission: RE | Admit: 2020-11-12 | Discharge: 2020-11-12 | Disposition: A | Discharge status: Home / Self Care | Payer: Medicare Other | Source: Ambulatory Visit | Attending: Podiatry | Admitting: Podiatry

## 2020-11-12 ENCOUNTER — Other Ambulatory Visit: Payer: Self-pay | Admitting: Podiatry

## 2020-11-12 DIAGNOSIS — L97519 Non-pressure chronic ulcer of other part of right foot with unspecified severity: Secondary | ICD-10-CM | POA: Diagnosis not present

## 2020-11-12 DIAGNOSIS — M86171 Other acute osteomyelitis, right ankle and foot: Secondary | ICD-10-CM | POA: Insufficient documentation

## 2020-11-14 NOTE — Progress Notes (Signed)
Subjective:   Patient ID: Patricia Bass, female   DOB: 60 y.o.   MRN: [id number]   HPI 60 year old female presents With Concern for Right Big Toe Ulceration Which Has Been Ongoing for Last 3 to 4 Months.  She Has Not Noticed Any Drainage or Discharge.  She Was Previously Seen Another Provider for This and She States They Were Trying to Put Cream on It but Has Not Been Helping.  She Has Seen Some Swelling but No Redness.  She Denies Any Fevers or Chills.   Has a history of an ulceration of the left big toe which she states is subsequently healed.   Review of Systems  All other systems reviewed and are negative.  Past Medical History:  Diagnosis Date   Anemia    Anxiety    Arthritis    Bulging disc    Carpal tunnel syndrome    Cervicalgia    Chronic airway obstruction (HCC)    Chronic back pain    COPD (chronic obstructive pulmonary disease) (HCC)    Coronary artery disease    Diabetes mellitus (HCC) 01/11/2012   GERD (gastroesophageal reflux disease)    Headache(784.0)    Hypothyroid    Lumbago    Migraine    Myalgia and myositis, unspecified    Neuropathy    Pneumonia    Restless leg syndrome    Shortness of breath    Sleep apnea    uses O2 at home. uses it at home all times and at night at 2 liters.    Past Surgical History:  Procedure Laterality Date   BIOPSY  12/06/2016   Procedure: BIOPSY;  Surgeon: Corbin Ade, MD;  [facility]: [facility];  Service: Endoscopy;;  gastric    BLADDER SUSPENSION     CARDIAC CATHETERIZATION     CHOLECYSTECTOMY     COLONOSCOPY WITH PROPOFOL N/A 12/06/2016   Procedure: COLONOSCOPY WITH PROPOFOL  Formed stool could not do;  Surgeon: Corbin Ade, MD;  [facility]: [facility];  Service: Endoscopy;  Laterality: N/A;  10:00am   ESOPHAGOGASTRODUODENOSCOPY (EGD) WITH PROPOFOL N/A 12/06/2016   Procedure: ESOPHAGOGASTRODUODENOSCOPY (EGD) WITH PROPOFOL;  Surgeon: Corbin Ade, MD;  [facility]: [facility];  Service: Endoscopy;   Laterality: N/A;   MALONEY DILATION N/A 12/06/2016   Procedure: Elease Hashimoto DILATION;  Surgeon: Corbin Ade, MD;  Location: [facility];  Service: Endoscopy;  Laterality: N/A;   TUBAL LIGATION     ULNAR NERVE TRANSPOSITION  04/01/2011   Procedure: ULNAR NERVE DECOMPRESSION/TRANSPOSITION;  Surgeon: Dominica Severin, MD;  Location: [facility] OR;  Service: Orthopedics;  Laterality: Left;  ULNAR NERVE RELEASE WITH ANTERIOR TRANS POSITION REPAIR RECONSTRUCTION AS NECESSARY     Current Outpatient Medications:    doxycycline (VIBRA-TABS) 100 MG tablet, Take 1 tablet (100 mg total) by mouth 2 (two) times daily., Disp: 20 tablet, Rfl: 0   albuterol (PROVENTIL HFA;VENTOLIN HFA) 108 (90 BASE) MCG/ACT inhaler, Inhale 2 puffs into the lungs every 6 (six) hours as needed for wheezing or shortness of breath. , Disp: , Rfl:    aspirin 81 MG chewable tablet, Chew 81 mg 2 (two) times a week by mouth. , Disp: , Rfl:    atorvastatin (LIPITOR) 40 MG tablet, Take 40 mg by mouth every morning. , Disp: , Rfl:    cephALEXin (KEFLEX) 500 MG capsule, Take 1 capsule (500 mg total) by mouth 4 (four) times daily., Disp: 40 capsule, Rfl: 0   cetirizine (ZYRTEC) 10 MG tablet,  Take 10 mg daily by mouth. , Disp: , Rfl:    CHANTIX 1 MG tablet, Take 1 tablet by mouth 2 (two) times daily., Disp: , Rfl:    cilostazol (PLETAL) 50 MG tablet, Take 50 mg by mouth 2 (two) times daily., Disp: , Rfl:    doxepin (SINEQUAN) 100 MG capsule, Take 100 mg by mouth at bedtime., Disp: , Rfl:    ibuprofen (ADVIL,MOTRIN) 800 MG tablet, Take 800 mg by mouth 3 (three) times daily., Disp: , Rfl:    levothyroxine (SYNTHROID, LEVOTHROID) 75 MCG tablet, Take 1 tablet (75 mcg total) by mouth daily before breakfast., Disp: 30 tablet, Rfl: 0   lisinopril (PRINIVIL,ZESTRIL) 2.5 MG tablet, Take 2.5 mg at bedtime by mouth. , Disp: , Rfl:    metFORMIN (GLUCOPHAGE) 500 MG tablet, Take 1 tablet (500 mg total) by mouth 2 (two) times daily with a meal., Disp: 60 tablet,  Rfl: 0   mometasone (NASONEX) 50 MCG/ACT nasal spray, Place 2 sprays into the nose daily as needed (for rhinitis)., Disp: , Rfl:    naphazoline-glycerin (CLEAR EYES) 0.012-0.2 % SOLN, Place 1-2 drops every 4 (four) hours as needed into both eyes for eye irritation. , Disp: , Rfl:    NEUPRO 2 MG/24HR, 1 patch at bedtime., Disp: , Rfl:    omega-3 acid ethyl esters (LOVAZA) 1 g capsule, Take 2 g 2 (two) times daily by mouth. , Disp: , Rfl:    omeprazole (PRILOSEC) 40 MG capsule, Take 1 capsule (40 mg total) by mouth daily. (Patient taking differently: Take 40 mg by mouth daily as needed (for acid reflux). ), Disp: 30 capsule, Rfl: 0   Oxycodone HCl 10 MG TABS, Take 1 tablet by mouth every 4-6 hours as needed for pain, Disp: , Rfl:    polyethylene glycol (MIRALAX / GLYCOLAX) packet, Take 17 g by mouth daily as needed for mild constipation., Disp: , Rfl:    pregabalin (LYRICA) 300 MG capsule, Take 1 capsule (300 mg total) by mouth 2 (two) times daily., Disp: 60 capsule, Rfl: 0   tiZANidine (ZANAFLEX) 4 MG tablet, Take 4 mg by mouth 3 (three) times daily., Disp: , Rfl:    valACYclovir (VALTREX) 500 MG tablet, Take 500 mg by mouth 2 (two) times daily as needed. , Disp: , Rfl:   Allergies  Allergen Reactions   Duragesic Disc Transdermal System [Fentanyl] Nausea And Vomiting          Objective:  Physical Exam  General: AAO x3, NAD  Dermatological: The distal aspect of the right hallux is a granular wound with hyperkeratotic periwound.  After debridement of the wound today it measures 0.6 x 0.3 x 0.2 cm without any probing, undermining or tunneling.  There is no surrounding erythema, ascending cellulitis.  No fluctuation crepitation.  No malodor.  Prior to debridement it was slightly smaller 0.5 x 0.2 x 0.1 cm.    Vascular: Dorsalis Pedis artery and Posterior Tibial artery pedal pulses are palpable bilateral with immedate capillary fill time.  There is no pain with calf compression, swelling,  warmth, erythema.   Neruologic: Sensation decreased with Semmes Weinstein monofilament  Musculoskeletal: No tenderness palpation.  Muscular strength 5/5 in all groups tested bilateral.  Gait: Unassisted, Nonantalgic.       Assessment:   Chronic ulceration right hallux     Plan:  -Treatment options discussed including all alternatives, risks, and complications -Etiology of symptoms were discussed -X-rays were obtained and reviewed with the patient.  There is mild  indistinctness of the distal phalanx concern for possible myelitis.  He does have ordered a MRI of the toes on the right foot.  -ABI ordered -Sharply debrided the wound today utilizing #312 with scalpel without any complications to debride any nonviable devitalized tissue in order to help promote wound healing.  I debrided the wound and healthy, bleeding, granular wound base.  Minimal blood loss and hemostasis achieved through manual compression.  Continue with daily dressing changes.  Offloading with a surgical shoe which was dispensed today. -Doxycycline ordered.   -Blood work ordered including CBC, BMP, CRP, sed rate, A1c. -Monitor for any clinical signs or symptoms of infection and directed to call the office immediately should any occur or go to the ER.  Return in about 10 days (around 11/21/2020).  Trula Slade DPM

## 2020-11-17 ENCOUNTER — Telehealth: Payer: Self-pay | Admitting: *Deleted

## 2020-11-17 NOTE — Telephone Encounter (Signed)
-----   Message from Vivi Barrack, DPM sent at 11/14/2020  6:12 AM EST ----- I ordered a MRI on Tuesday if you could please follow-up on this.  Thank you.

## 2020-11-17 NOTE — Telephone Encounter (Signed)
Patient has an appointment on 12/03/2020 at [facility] imaging. Misty Stanley

## 2020-11-19 ENCOUNTER — Telehealth: Payer: Self-pay | Admitting: *Deleted

## 2020-11-19 NOTE — Telephone Encounter (Signed)
Patient is calling because she did not understand the tex sent concerning were to put the bandages, which one goes on the hammertoe and which one goes on the toe with the sore. Please advise.

## 2020-11-20 NOTE — Telephone Encounter (Signed)
Returned the call to patient giving instructions per Dr Wagoner, verbalized understanding.

## 2020-11-21 ENCOUNTER — Ambulatory Visit: Payer: Medicare Other | Admitting: Podiatry

## 2020-11-24 ENCOUNTER — Other Ambulatory Visit: Payer: Self-pay | Admitting: Orthopaedic Surgery

## 2020-11-24 ENCOUNTER — Ambulatory Visit (INDEPENDENT_AMBULATORY_CARE_PROVIDER_SITE_OTHER): Payer: Medicare Other | Admitting: Podiatry

## 2020-11-24 ENCOUNTER — Other Ambulatory Visit: Payer: Self-pay

## 2020-11-24 DIAGNOSIS — L97512 Non-pressure chronic ulcer of other part of right foot with fat layer exposed: Secondary | ICD-10-CM

## 2020-11-24 DIAGNOSIS — S12490A Other displaced fracture of fifth cervical vertebra, initial encounter for closed fracture: Secondary | ICD-10-CM

## 2020-11-24 DIAGNOSIS — M4322 Fusion of spine, cervical region: Secondary | ICD-10-CM

## 2020-11-24 MED ORDER — DOXYCYCLINE HYCLATE 100 MG PO TABS: 100.0000 mg | ORAL_TABLET | Freq: Two times a day (BID) | ORAL | 0 refills | Status: DC

## 2020-11-30 NOTE — Progress Notes (Signed)
Subjective: 60 year old female presents the office today for evaluation of right big toe ulceration.  She thinks the wound is gotten somewhat better.  She denies any significant drainage or pus.  No increase in swelling or redness.  She denies any fevers or chills.  She has no other concerns.  Objective: AAO x3, NAD DP/PT pulses palpable bilaterally, CRT less than 3 seconds At the distal aspect of the right hallux is an ulceration which appears to be somewhat smaller today.  Prior to wound debridement it was 0.3 x 0.2 x 0.1 cm.  After debridement 0.5 x 0.2 x 0.1 cm.  There is no probing to monitor tunneling.  No fluctuation crepitation.  No cellulitis noted. No pain with calf compression, swelling, warmth, erythema  Assessment: Chronic ulceration right hallux  Plan: -All treatment options discussed with the patient including all alternatives, risks, complications.  -Sharply debrided the hyperkeratotic lesion, ulceration today utilizing 312 with scalpel to any complications down to healthy, granular tissue.  There is no blood loss.  She tolerated the procedure well and complications.  Continue daily dressing changes, offloading. -Lab work: follow up from PCP -ABI: normal -MRI: pending -Monitor for any clinical signs or symptoms of infection and directed to call the office immediately should any occur or go to the ER. -Patient encouraged to call the office with any questions, concerns, change in symptoms.

## 2020-12-01 ENCOUNTER — Telehealth: Payer: Self-pay | Admitting: *Deleted

## 2020-12-01 NOTE — Telephone Encounter (Signed)
Called and spoke with Alaina from [facility] today and stated that I needed to get the recent blood work from the primary care doctor and they sent a message to the doctor and will be sending it over as soon as the doctor gets the message. Misty Stanley

## 2020-12-01 NOTE — Telephone Encounter (Signed)
-----   Message from Vivi Barrack, DPM sent at 11/30/2020  9:17 AM EST ----- Can we get the blood work from her primary care provider?  Thank you.

## 2020-12-03 ENCOUNTER — Other Ambulatory Visit: Payer: Medicare Other

## 2020-12-04 ENCOUNTER — Telehealth: Payer: Self-pay | Admitting: *Deleted

## 2020-12-04 NOTE — Telephone Encounter (Signed)
[facility], could not get thru to speak with someone,(waited about 30 mins) could not leave message or send a fax request. Will try later.

## 2020-12-04 NOTE — Telephone Encounter (Signed)
-----   Message from Vivi Barrack, DPM sent at 11/30/2020  9:17 AM EST ----- Can we get the blood work from her primary care provider?  Thank you.

## 2020-12-05 ENCOUNTER — Other Ambulatory Visit: Payer: Self-pay

## 2020-12-05 ENCOUNTER — Ambulatory Visit (INDEPENDENT_AMBULATORY_CARE_PROVIDER_SITE_OTHER): Payer: Medicare Other | Admitting: Podiatry

## 2020-12-05 DIAGNOSIS — L97512 Non-pressure chronic ulcer of other part of right foot with fat layer exposed: Secondary | ICD-10-CM

## 2020-12-08 NOTE — Progress Notes (Signed)
Subjective: 60 year old female presents the office today for evaluation of right big toe ulceration.  She believes that the wound is getting better.  She has a friend who accompanies her is not can take care of it as well.  Does not see any swelling or redness or any drainage.  She is continue with daily dressing changes with the ointment.  No fevers or chills that she reports no new concerns today.    Objective: AAO x3, NAD DP/PT pulses palpable bilaterally, CRT less than 3 seconds At the distal aspect of the right hallux is a hyperkeratotic lesion with dried blood.  Upon debridement the wound is more superficial and appears to be almost completely healed has some dried blood present.  There is no surrounding erythema, ascending cellulitis.  No fluctuation crepitation.  No malodor. No pain with calf compression, swelling, warmth, erythema  Assessment: Chronic ulceration right hallux-improving  Plan: -All treatment options discussed with the patient including all alternatives, risks, complications.  -Sharply debrided the hyperkeratotic lesion, ulceration today utilizing 312 with scalpel to any complications down to healthy tissue.  There is no blood loss.  She tolerated the procedure well and complications.  Continue daily dressing changes, offloading. -Lab work: follow up from PCP-contacted them and still awaiting this. -ABI: normal -MRI: pending -Monitor for any clinical signs or symptoms of infection and directed to call the office immediately should any occur or go to the ER. -Patient encouraged to call the office with any questions, concerns, change in symptoms.   Vivi Barrack DPM

## 2020-12-11 ENCOUNTER — Ambulatory Visit
Admission: RE | Admit: 2020-12-11 | Discharge: 2020-12-11 | Disposition: A | Discharge status: Home / Self Care | Payer: Medicare Other | Source: Ambulatory Visit | Attending: Orthopaedic Surgery | Admitting: Orthopaedic Surgery

## 2020-12-11 DIAGNOSIS — S12490A Other displaced fracture of fifth cervical vertebra, initial encounter for closed fracture: Secondary | ICD-10-CM

## 2020-12-11 DIAGNOSIS — M4322 Fusion of spine, cervical region: Secondary | ICD-10-CM

## 2020-12-11 MED ORDER — DIAZEPAM 5 MG PO TABS
10.0000 mg | ORAL_TABLET | Freq: Once | ORAL | Status: AC
  Administered 2020-12-11: 5 mg via ORAL

## 2020-12-11 MED ORDER — IOPAMIDOL (ISOVUE-M 300) INJECTION 61%
10.0000 mL | Freq: Once | INTRAMUSCULAR | Status: AC
  Administered 2020-12-11: 10 mL via INTRATHECAL

## 2020-12-11 NOTE — Discharge Instructions (Signed)

## 2020-12-15 NOTE — Telephone Encounter (Signed)
-----   Message from Vivi Barrack, DPM sent at 11/30/2020  9:17 AM EST ----- Can we get the blood work from her primary care provider?  Thank you.

## 2020-12-19 ENCOUNTER — Other Ambulatory Visit: Payer: Medicare Other

## 2020-12-19 ENCOUNTER — Ambulatory Visit: Payer: Medicare Other | Admitting: Podiatry

## 2021-01-08 ENCOUNTER — Ambulatory Visit (INDEPENDENT_AMBULATORY_CARE_PROVIDER_SITE_OTHER): Payer: 59 | Admitting: Podiatry

## 2021-01-08 ENCOUNTER — Ambulatory Visit (INDEPENDENT_AMBULATORY_CARE_PROVIDER_SITE_OTHER): Payer: 59

## 2021-01-08 ENCOUNTER — Other Ambulatory Visit: Payer: Self-pay

## 2021-01-08 VITALS — Temp 99.3°F

## 2021-01-08 DIAGNOSIS — T300 Burn of unspecified body region, unspecified degree: Secondary | ICD-10-CM | POA: Diagnosis not present

## 2021-01-08 DIAGNOSIS — L97409 Non-pressure chronic ulcer of unspecified heel and midfoot with unspecified severity: Secondary | ICD-10-CM

## 2021-01-08 DIAGNOSIS — L97425 Non-pressure chronic ulcer of left heel and midfoot with muscle involvement without evidence of necrosis: Secondary | ICD-10-CM

## 2021-01-08 MED ORDER — SILVER SULFADIAZINE 1 % EX CREA: 1.0000 "application " | TOPICAL_CREAM | Freq: Every day | CUTANEOUS | 0 refills | Status: DC

## 2021-01-08 MED ORDER — DOXYCYCLINE HYCLATE 100 MG PO TABS: 100.0000 mg | ORAL_TABLET | Freq: Two times a day (BID) | ORAL | 0 refills | Status: DC
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
## 2021-01-15 ENCOUNTER — Ambulatory Visit: Payer: 59 | Admitting: Podiatry

## 2021-01-20 ENCOUNTER — Ambulatory Visit: Payer: Medicare Other | Admitting: Podiatry

## 2021-02-17 ENCOUNTER — Ambulatory Visit (INDEPENDENT_AMBULATORY_CARE_PROVIDER_SITE_OTHER): Payer: 59 | Admitting: Podiatry

## 2021-02-17 ENCOUNTER — Telehealth: Payer: Self-pay | Admitting: *Deleted

## 2021-02-17 ENCOUNTER — Other Ambulatory Visit: Payer: Self-pay

## 2021-02-17 ENCOUNTER — Ambulatory Visit (INDEPENDENT_AMBULATORY_CARE_PROVIDER_SITE_OTHER): Payer: 59

## 2021-02-17 DIAGNOSIS — L97422 Non-pressure chronic ulcer of left heel and midfoot with fat layer exposed: Secondary | ICD-10-CM

## 2021-02-17 DIAGNOSIS — L97412 Non-pressure chronic ulcer of right heel and midfoot with fat layer exposed: Secondary | ICD-10-CM | POA: Diagnosis not present

## 2021-02-17 DIAGNOSIS — L97409 Non-pressure chronic ulcer of unspecified heel and midfoot with unspecified severity: Secondary | ICD-10-CM

## 2021-02-17 DIAGNOSIS — L97419 Non-pressure chronic ulcer of right heel and midfoot with unspecified severity: Secondary | ICD-10-CM

## 2021-02-17 DIAGNOSIS — L97429 Non-pressure chronic ulcer of left heel and midfoot with unspecified severity: Secondary | ICD-10-CM

## 2021-02-20 LAB — CBC WITH DIFFERENTIAL/PLATELET
Absolute Monocytes: 546 cells/uL (ref 200–950)
Basophils Absolute: 42 cells/uL (ref 0–200)
Basophils Relative: 0.4 %
Eosinophils Absolute: 84 cells/uL (ref 15–500)
Eosinophils Relative: 0.8 %
HCT: 39.9 % (ref 35.0–45.0)
Hemoglobin: 12.7 g/dL (ref 11.7–15.5)
Lymphs Abs: 3549 cells/uL (ref 850–3900)
MCH: 27.7 pg (ref 27.0–33.0)
MCHC: 31.8 g/dL — ABNORMAL LOW (ref 32.0–36.0)
MCV: 87.1 fL (ref 80.0–100.0)
MPV: 11.2 fL (ref 7.5–12.5)
Monocytes Relative: 5.2 %
Neutro Abs: 6279 cells/uL (ref 1500–7800)
Neutrophils Relative %: 59.8 %
Platelets: 283 10*3/uL (ref 140–400)
RBC: 4.58 10*6/uL (ref 3.80–5.10)
RDW: 18.9 % — ABNORMAL HIGH (ref 11.0–15.0)
Total Lymphocyte: 33.8 %
WBC: 10.5 10*3/uL (ref 3.8–10.8)

## 2021-02-20 LAB — BASIC METABOLIC PANEL
BUN: 7 mg/dL (ref 7–25)
CO2: 32 mmol/L (ref 20–32)
Calcium: 9.4 mg/dL (ref 8.6–10.4)
Chloride: 102 mmol/L (ref 98–110)
Creat: 0.63 mg/dL (ref 0.50–1.05)
Glucose, Bld: 96 mg/dL (ref 65–99)
Potassium: 3.6 mmol/L (ref 3.5–5.3)
Sodium: 142 mmol/L (ref 135–146)

## 2021-02-20 LAB — C-REACTIVE PROTEIN: CRP: 2.3 mg/L (ref <8.0)

## 2021-02-20 LAB — SEDIMENTATION RATE: Sed Rate: 19 mm/h (ref 0–30)
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
## 2021-02-24 ENCOUNTER — Other Ambulatory Visit: Payer: Self-pay

## 2021-02-24 ENCOUNTER — Telehealth: Payer: Self-pay | Admitting: Podiatry

## 2021-02-24 ENCOUNTER — Ambulatory Visit (INDEPENDENT_AMBULATORY_CARE_PROVIDER_SITE_OTHER): Payer: 59 | Admitting: Podiatry

## 2021-02-24 DIAGNOSIS — L97412 Non-pressure chronic ulcer of right heel and midfoot with fat layer exposed: Secondary | ICD-10-CM | POA: Diagnosis not present

## 2021-02-24 DIAGNOSIS — L97422 Non-pressure chronic ulcer of left heel and midfoot with fat layer exposed: Secondary | ICD-10-CM | POA: Diagnosis not present

## 2021-02-24 NOTE — Telephone Encounter (Signed)
Pt notified to keep appt so he can check the wound.

## 2021-02-24 NOTE — Telephone Encounter (Signed)
Pt called asking if she still needed to come in today as she has had her labs but not the mri?
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
## 2021-03-05 ENCOUNTER — Ambulatory Visit
Admission: RE | Admit: 2021-03-05 | Discharge: 2021-03-05 | Disposition: A | Discharge status: Home / Self Care | Payer: 59 | Source: Ambulatory Visit | Attending: Podiatry | Admitting: Podiatry

## 2021-03-05 ENCOUNTER — Other Ambulatory Visit: Payer: Self-pay

## 2021-03-05 DIAGNOSIS — L97412 Non-pressure chronic ulcer of right heel and midfoot with fat layer exposed: Secondary | ICD-10-CM

## 2021-03-06 ENCOUNTER — Ambulatory Visit: Payer: 59 | Admitting: Podiatry

## 2021-03-09 ENCOUNTER — Other Ambulatory Visit: Payer: Self-pay

## 2021-03-09 ENCOUNTER — Ambulatory Visit: Payer: 59 | Admitting: Podiatry

## 2021-03-09 ENCOUNTER — Ambulatory Visit (INDEPENDENT_AMBULATORY_CARE_PROVIDER_SITE_OTHER): Payer: 59 | Admitting: Podiatry

## 2021-03-09 DIAGNOSIS — L97412 Non-pressure chronic ulcer of right heel and midfoot with fat layer exposed: Secondary | ICD-10-CM | POA: Diagnosis not present

## 2021-03-09 DIAGNOSIS — L97422 Non-pressure chronic ulcer of left heel and midfoot with fat layer exposed: Secondary | ICD-10-CM
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
## 2021-03-23 ENCOUNTER — Ambulatory Visit: Payer: 59 | Admitting: Podiatry

## 2021-03-26 ENCOUNTER — Other Ambulatory Visit: Payer: Self-pay

## 2021-03-26 ENCOUNTER — Ambulatory Visit (INDEPENDENT_AMBULATORY_CARE_PROVIDER_SITE_OTHER): Payer: 59 | Admitting: Podiatry

## 2021-03-26 DIAGNOSIS — L97412 Non-pressure chronic ulcer of right heel and midfoot with fat layer exposed: Secondary | ICD-10-CM

## 2021-03-26 DIAGNOSIS — L84 Corns and callosities: Secondary | ICD-10-CM

## 2021-03-27 NOTE — Telephone Encounter (Signed)
error 
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
## 2021-04-09 ENCOUNTER — Ambulatory Visit: Payer: 59 | Admitting: Podiatry

## 2021-04-14 ENCOUNTER — Ambulatory Visit: Payer: 59 | Admitting: Podiatry

## 2021-04-24 ENCOUNTER — Ambulatory Visit: Payer: 59 | Admitting: Podiatry

## 2021-05-19 ENCOUNTER — Ambulatory Visit: Payer: 59 | Admitting: Podiatry

## 2021-05-21 ENCOUNTER — Ambulatory Visit: Payer: 59 | Admitting: Podiatry

## 2021-06-08 ENCOUNTER — Ambulatory Visit: Payer: 59 | Admitting: Podiatry

## 2022-04-01 ENCOUNTER — Emergency Department (HOSPITAL_COMMUNITY): Payer: Medicare Other

## 2022-04-01 ENCOUNTER — Encounter (HOSPITAL_COMMUNITY): Payer: Self-pay | Admitting: *Deleted

## 2022-04-01 ENCOUNTER — Inpatient Hospital Stay (HOSPITAL_COMMUNITY)
Admission: EM | Admit: 2022-04-01 | Discharge: 2022-04-05 | DRG: 291 | Disposition: A | Discharge status: Home Health | Payer: Medicare Other | Attending: Internal Medicine | Admitting: Internal Medicine

## 2022-04-01 ENCOUNTER — Other Ambulatory Visit: Payer: Self-pay

## 2022-04-01 DIAGNOSIS — G2581 Restless legs syndrome: Secondary | ICD-10-CM | POA: Diagnosis present

## 2022-04-01 DIAGNOSIS — F419 Anxiety disorder, unspecified: Secondary | ICD-10-CM | POA: Diagnosis not present

## 2022-04-01 DIAGNOSIS — R008 Other abnormalities of heart beat: Secondary | ICD-10-CM | POA: Diagnosis not present

## 2022-04-01 DIAGNOSIS — I5033 Acute on chronic diastolic (congestive) heart failure: Secondary | ICD-10-CM | POA: Insufficient documentation

## 2022-04-01 DIAGNOSIS — Z6830 Body mass index (BMI) 30.0-30.9, adult: Secondary | ICD-10-CM

## 2022-04-01 DIAGNOSIS — Z7984 Long term (current) use of oral hypoglycemic drugs: Secondary | ICD-10-CM

## 2022-04-01 DIAGNOSIS — J441 Chronic obstructive pulmonary disease with (acute) exacerbation: Secondary | ICD-10-CM | POA: Diagnosis not present

## 2022-04-01 DIAGNOSIS — Z9049 Acquired absence of other specified parts of digestive tract: Secondary | ICD-10-CM

## 2022-04-01 DIAGNOSIS — E876 Hypokalemia: Secondary | ICD-10-CM | POA: Diagnosis not present

## 2022-04-01 DIAGNOSIS — Z720 Tobacco use: Secondary | ICD-10-CM | POA: Diagnosis not present

## 2022-04-01 DIAGNOSIS — E1169 Type 2 diabetes mellitus with other specified complication: Secondary | ICD-10-CM | POA: Diagnosis present

## 2022-04-01 DIAGNOSIS — Z7902 Long term (current) use of antithrombotics/antiplatelets: Secondary | ICD-10-CM

## 2022-04-01 DIAGNOSIS — I110 Hypertensive heart disease with heart failure: Secondary | ICD-10-CM | POA: Diagnosis not present

## 2022-04-01 DIAGNOSIS — I2510 Atherosclerotic heart disease of native coronary artery without angina pectoris: Secondary | ICD-10-CM | POA: Diagnosis present

## 2022-04-01 DIAGNOSIS — R911 Solitary pulmonary nodule: Secondary | ICD-10-CM | POA: Diagnosis not present

## 2022-04-01 DIAGNOSIS — D649 Anemia, unspecified: Secondary | ICD-10-CM | POA: Diagnosis not present

## 2022-04-01 DIAGNOSIS — E871 Hypo-osmolality and hyponatremia: Secondary | ICD-10-CM | POA: Diagnosis not present

## 2022-04-01 DIAGNOSIS — R7989 Other specified abnormal findings of blood chemistry: Secondary | ICD-10-CM | POA: Diagnosis not present

## 2022-04-01 DIAGNOSIS — G4730 Sleep apnea, unspecified: Secondary | ICD-10-CM | POA: Diagnosis not present

## 2022-04-01 DIAGNOSIS — J449 Chronic obstructive pulmonary disease, unspecified: Secondary | ICD-10-CM | POA: Diagnosis present

## 2022-04-01 DIAGNOSIS — I3139 Other pericardial effusion (noninflammatory): Secondary | ICD-10-CM | POA: Diagnosis not present

## 2022-04-01 DIAGNOSIS — I509 Heart failure, unspecified: Secondary | ICD-10-CM

## 2022-04-01 DIAGNOSIS — E669 Obesity, unspecified: Secondary | ICD-10-CM | POA: Diagnosis not present

## 2022-04-01 DIAGNOSIS — J9622 Acute and chronic respiratory failure with hypercapnia: Secondary | ICD-10-CM | POA: Diagnosis present

## 2022-04-01 DIAGNOSIS — F17210 Nicotine dependence, cigarettes, uncomplicated: Secondary | ICD-10-CM | POA: Diagnosis present

## 2022-04-01 DIAGNOSIS — J9621 Acute and chronic respiratory failure with hypoxia: Secondary | ICD-10-CM | POA: Diagnosis not present

## 2022-04-01 DIAGNOSIS — E039 Hypothyroidism, unspecified: Secondary | ICD-10-CM | POA: Diagnosis present

## 2022-04-01 DIAGNOSIS — Z79899 Other long term (current) drug therapy: Secondary | ICD-10-CM

## 2022-04-01 DIAGNOSIS — R0902 Hypoxemia: Principal | ICD-10-CM

## 2022-04-01 DIAGNOSIS — Z9981 Dependence on supplemental oxygen: Secondary | ICD-10-CM

## 2022-04-01 DIAGNOSIS — E782 Mixed hyperlipidemia: Secondary | ICD-10-CM | POA: Diagnosis present

## 2022-04-01 DIAGNOSIS — Z885 Allergy status to narcotic agent status: Secondary | ICD-10-CM

## 2022-04-01 DIAGNOSIS — Z9851 Tubal ligation status: Secondary | ICD-10-CM

## 2022-04-01 DIAGNOSIS — E785 Hyperlipidemia, unspecified: Secondary | ICD-10-CM | POA: Diagnosis not present

## 2022-04-01 DIAGNOSIS — K219 Gastro-esophageal reflux disease without esophagitis: Secondary | ICD-10-CM | POA: Diagnosis present

## 2022-04-01 DIAGNOSIS — I5021 Acute systolic (congestive) heart failure: Secondary | ICD-10-CM | POA: Diagnosis not present

## 2022-04-01 DIAGNOSIS — Z79890 Hormone replacement therapy (postmenopausal): Secondary | ICD-10-CM

## 2022-04-01 DIAGNOSIS — E66811 Obesity, class 1: Secondary | ICD-10-CM | POA: Insufficient documentation

## 2022-04-01 DIAGNOSIS — I4510 Unspecified right bundle-branch block: Secondary | ICD-10-CM | POA: Diagnosis not present

## 2022-04-01 DIAGNOSIS — I10 Essential (primary) hypertension: Secondary | ICD-10-CM | POA: Diagnosis not present

## 2022-04-01 DIAGNOSIS — Z7982 Long term (current) use of aspirin: Secondary | ICD-10-CM

## 2022-04-01 DIAGNOSIS — E8809 Other disorders of plasma-protein metabolism, not elsewhere classified: Secondary | ICD-10-CM | POA: Diagnosis present

## 2022-04-01 DIAGNOSIS — J9601 Acute respiratory failure with hypoxia: Secondary | ICD-10-CM | POA: Diagnosis not present

## 2022-04-01 DIAGNOSIS — Z91199 Patient's noncompliance with other medical treatment and regimen due to unspecified reason: Secondary | ICD-10-CM

## 2022-04-01 DIAGNOSIS — E119 Type 2 diabetes mellitus without complications: Secondary | ICD-10-CM

## 2022-04-01 LAB — COMPREHENSIVE METABOLIC PANEL
ALT: 17 U/L (ref 0–44)
AST: 23 U/L (ref 15–41)
Albumin: 3.2 g/dL — ABNORMAL LOW (ref 3.5–5.0)
Alkaline Phosphatase: 92 U/L (ref 38–126)
Anion gap: 11 (ref 5–15)
BUN: 13 mg/dL (ref 8–23)
CO2: 30 mmol/L (ref 22–32)
Calcium: 8.1 mg/dL — ABNORMAL LOW (ref 8.9–10.3)
Chloride: 84 mmol/L — ABNORMAL LOW (ref 98–111)
Creatinine, Ser: 0.87 mg/dL (ref 0.44–1.00)
GFR, Estimated: >60 mL/min (ref >60)
Glucose, Bld: 109 mg/dL — ABNORMAL HIGH (ref 70–99)
Potassium: 3.3 mmol/L — ABNORMAL LOW (ref 3.5–5.1)
Sodium: 125 mmol/L — ABNORMAL LOW (ref 135–145)
Total Bilirubin: 0.7 mg/dL (ref 0.3–1.2)
Total Protein: 7 g/dL (ref 6.5–8.1)

## 2022-04-01 LAB — CBC WITH DIFFERENTIAL/PLATELET
Abs Immature Granulocytes: 0.09 10*3/uL — ABNORMAL HIGH (ref 0.00–0.07)
Basophils Absolute: 0 10*3/uL (ref 0.0–0.1)
Basophils Relative: 0 %
Eosinophils Absolute: 0 10*3/uL (ref 0.0–0.5)
Eosinophils Relative: 0 %
HCT: 35.6 % — ABNORMAL LOW (ref 36.0–46.0)
Hemoglobin: 10.3 g/dL — ABNORMAL LOW (ref 12.0–15.0)
Immature Granulocytes: 1 %
Lymphocytes Relative: 17 %
Lymphs Abs: 1.4 10*3/uL (ref 0.7–4.0)
MCH: 23.7 pg — ABNORMAL LOW (ref 26.0–34.0)
MCHC: 28.9 g/dL — ABNORMAL LOW (ref 30.0–36.0)
MCV: 82 fL (ref 80.0–100.0)
Monocytes Absolute: 0.7 10*3/uL (ref 0.1–1.0)
Monocytes Relative: 9 %
Neutro Abs: 5.7 10*3/uL (ref 1.7–7.7)
Neutrophils Relative %: 73 %
Platelets: 203 10*3/uL (ref 150–400)
RBC: 4.34 MIL/uL (ref 3.87–5.11)
RDW: 22.1 % — ABNORMAL HIGH (ref 11.5–15.5)
WBC: 7.9 10*3/uL (ref 4.0–10.5)
nRBC: 0.5 % — ABNORMAL HIGH (ref 0.0–0.2)

## 2022-04-01 LAB — BLOOD GAS, ARTERIAL
Acid-Base Excess: 11.8 mmol/L — ABNORMAL HIGH (ref 0.0–2.0)
Bicarbonate: 38.3 mmol/L — ABNORMAL HIGH (ref 20.0–28.0)
Drawn by: 23430
O2 Saturation: 88.4 %
Patient temperature: 37.2
pCO2 arterial: 60 mmHg — ABNORMAL HIGH (ref 32–48)
pH, Arterial: 7.42 (ref 7.35–7.45)
pO2, Arterial: 55 mmHg — ABNORMAL LOW (ref 83–108)

## 2022-04-01 LAB — GLUCOSE, CAPILLARY: Glucose-Capillary: 222 mg/dL — ABNORMAL HIGH (ref 70–99)

## 2022-04-01 LAB — MRSA NEXT GEN BY PCR, NASAL: MRSA by PCR Next Gen: NOT DETECTED

## 2022-04-01 LAB — TROPONIN I (HIGH SENSITIVITY)
Troponin I (High Sensitivity): 11 ng/L (ref <18)
Troponin I (High Sensitivity): 11 ng/L (ref <18)

## 2022-04-01 LAB — BRAIN NATRIURETIC PEPTIDE: B Natriuretic Peptide: 589 pg/mL — ABNORMAL HIGH (ref 0.0–100.0)

## 2022-04-01 MED ORDER — POTASSIUM CHLORIDE CRYS ER 20 MEQ PO TBCR
40.0000 meq | EXTENDED_RELEASE_TABLET | Freq: Once | ORAL | Status: AC
  Administered 2022-04-01: 40 meq via ORAL
  Filled 2022-04-01: qty 2

## 2022-04-01 MED ORDER — METHYLPREDNISOLONE SODIUM SUCC 40 MG IJ SOLR
40.0000 mg | Freq: Two times a day (BID) | INTRAMUSCULAR | Status: DC
  Administered 2022-04-02: 40 mg via INTRAVENOUS
  Filled 2022-04-01: qty 1

## 2022-04-01 MED ORDER — AZITHROMYCIN 250 MG PO TABS
250.0000 mg | ORAL_TABLET | Freq: Every day | ORAL | Status: DC
  Administered 2022-04-02: 250 mg via ORAL
  Filled 2022-04-01: qty 1

## 2022-04-01 MED ORDER — ACETAMINOPHEN 650 MG RE SUPP: 650.0000 mg | Freq: Four times a day (QID) | RECTAL | Status: DC | PRN

## 2022-04-01 MED ORDER — IPRATROPIUM-ALBUTEROL 0.5-2.5 (3) MG/3ML IN SOLN
3.0000 mL | Freq: Four times a day (QID) | RESPIRATORY_TRACT | Status: DC
  Administered 2022-04-01 – 2022-04-03 (×7): 3 mL via RESPIRATORY_TRACT
  Filled 2022-04-01 (×7): qty 3

## 2022-04-01 MED ORDER — IPRATROPIUM-ALBUTEROL 0.5-2.5 (3) MG/3ML IN SOLN
6.0000 mL | Freq: Once | RESPIRATORY_TRACT | Status: AC
  Administered 2022-04-01: 6 mL via RESPIRATORY_TRACT
  Filled 2022-04-01: qty 3

## 2022-04-01 MED ORDER — ACETAMINOPHEN 325 MG PO TABS
650.0000 mg | ORAL_TABLET | Freq: Four times a day (QID) | ORAL | Status: DC | PRN
  Administered 2022-04-03: 650 mg via ORAL
  Filled 2022-04-01: qty 2

## 2022-04-01 MED ORDER — DM-GUAIFENESIN ER 30-600 MG PO TB12
1.0000 | ORAL_TABLET | Freq: Two times a day (BID) | ORAL | Status: DC
  Administered 2022-04-01 – 2022-04-05 (×8): 1 via ORAL
  Filled 2022-04-01 (×8): qty 1

## 2022-04-01 MED ORDER — OMEGA-3-ACID ETHYL ESTERS 1 G PO CAPS
2.0000 g | ORAL_CAPSULE | Freq: Two times a day (BID) | ORAL | Status: DC
  Administered 2022-04-02 – 2022-04-05 (×7): 2 g via ORAL
  Filled 2022-04-01 (×7): qty 2

## 2022-04-01 MED ORDER — CHLORHEXIDINE GLUCONATE CLOTH 2 % EX PADS
6.0000 | MEDICATED_PAD | Freq: Every day | CUTANEOUS | Status: DC
  Administered 2022-04-01 – 2022-04-03 (×3): 6 via TOPICAL

## 2022-04-01 MED ORDER — IPRATROPIUM-ALBUTEROL 0.5-2.5 (3) MG/3ML IN SOLN: 3.0000 mL | RESPIRATORY_TRACT | Status: DC | PRN

## 2022-04-01 MED ORDER — FUROSEMIDE 10 MG/ML IJ SOLN
40.0000 mg | Freq: Two times a day (BID) | INTRAMUSCULAR | Status: DC
  Administered 2022-04-02: 40 mg via INTRAVENOUS
  Filled 2022-04-01: qty 4

## 2022-04-01 MED ORDER — AZITHROMYCIN 250 MG PO TABS
500.0000 mg | ORAL_TABLET | Freq: Every day | ORAL | Status: AC
  Administered 2022-04-01: 500 mg via ORAL
  Filled 2022-04-01: qty 2

## 2022-04-01 MED ORDER — INSULIN ASPART 100 UNIT/ML IJ SOLN
0.0000 [IU] | Freq: Three times a day (TID) | INTRAMUSCULAR | Status: DC
  Administered 2022-04-02 (×2): 2 [IU] via SUBCUTANEOUS

## 2022-04-01 MED ORDER — LEVOTHYROXINE SODIUM 75 MCG PO TABS
75.0000 ug | ORAL_TABLET | Freq: Every day | ORAL | Status: DC
  Administered 2022-04-02 – 2022-04-05 (×4): 75 ug via ORAL
  Filled 2022-04-01 (×4): qty 1

## 2022-04-01 MED ORDER — FUROSEMIDE 10 MG/ML IJ SOLN
40.0000 mg | Freq: Once | INTRAMUSCULAR | Status: AC
  Administered 2022-04-01: 40 mg via INTRAVENOUS
  Filled 2022-04-01: qty 4

## 2022-04-01 MED ORDER — IOHEXOL 350 MG/ML SOLN
75.0000 mL | Freq: Once | INTRAVENOUS | Status: AC | PRN
  Administered 2022-04-01: 75 mL via INTRAVENOUS

## 2022-04-01 MED ORDER — ENOXAPARIN SODIUM 40 MG/0.4ML IJ SOSY
40.0000 mg | PREFILLED_SYRINGE | INTRAMUSCULAR | Status: DC
  Administered 2022-04-01 – 2022-04-05 (×5): 40 mg via SUBCUTANEOUS
  Filled 2022-04-01 (×5): qty 0.4

## 2022-04-01 MED ORDER — ATORVASTATIN CALCIUM 40 MG PO TABS
40.0000 mg | ORAL_TABLET | ORAL | Status: DC
  Administered 2022-04-03 – 2022-04-05 (×3): 40 mg via ORAL
  Filled 2022-04-01 (×3): qty 1

## 2022-04-01 MED ORDER — PROCHLORPERAZINE EDISYLATE 10 MG/2ML IJ SOLN: 10.0000 mg | Freq: Four times a day (QID) | INTRAMUSCULAR | Status: DC | PRN

## 2022-04-01 MED ORDER — GLUCERNA SHAKE PO LIQD
237.0000 mL | Freq: Three times a day (TID) | ORAL | Status: DC
  Administered 2022-04-02 – 2022-04-05 (×9): 237 mL via ORAL
  Filled 2022-04-01 (×3): qty 237

## 2022-04-01 MED ORDER — VARENICLINE TARTRATE 1 MG PO TABS
1.0000 mg | ORAL_TABLET | Freq: Two times a day (BID) | ORAL | Status: DC
  Administered 2022-04-02 – 2022-04-05 (×7): 1 mg via ORAL
  Filled 2022-04-01 (×11): qty 1

## 2022-04-01 MED ORDER — METHYLPREDNISOLONE SODIUM SUCC 125 MG IJ SOLR
125.0000 mg | Freq: Once | INTRAMUSCULAR | Status: AC
  Administered 2022-04-01: 125 mg via INTRAVENOUS
  Filled 2022-04-01: qty 2

## 2022-04-01 MED ORDER — PANTOPRAZOLE SODIUM 40 MG PO TBEC
40.0000 mg | DELAYED_RELEASE_TABLET | Freq: Every day | ORAL | Status: DC
  Administered 2022-04-01 – 2022-04-05 (×5): 40 mg via ORAL
  Filled 2022-04-01 (×5): qty 1

## 2022-04-01 MED ORDER — LISINOPRIL 5 MG PO TABS: 2.5000 mg | ORAL_TABLET | Freq: Every day | ORAL | Status: DC

## 2022-04-01 MED ORDER — ASPIRIN 81 MG PO CHEW
81.0000 mg | CHEWABLE_TABLET | ORAL | Status: DC
  Administered 2022-04-01 – 2022-04-05 (×2): 81 mg via ORAL
  Filled 2022-04-01 (×2): qty 1

## 2022-04-01 MED ORDER — IOHEXOL 300 MG/ML  SOLN: 100.0000 mL | Freq: Once | INTRAMUSCULAR | Status: DC | PRN

## 2022-04-01 NOTE — ED Triage Notes (Signed)
Pt brought in by [facility] from home with c/o swelling in her legs and face. When EMS arrived, pt was smoking a cigarette and had no other complaints other than the swelling. Pt recently saw her PCP and was started Lasix although pt reports it hadn't been helping. When pt was put initially on the ambulance, pt's O2 sat was found to be 55-60% on RA. Pt was given Duoneb en route with increase of O2 sat to 100%. 18g IV in left a/c. EMS reports pt was wheezing and had crackles, more on the left side than the right.

## 2022-04-01 NOTE — Progress Notes (Signed)
Patient received to unit via stretcher on BiPAP mask  SpO2 96%, answers questions appropriately, moderate grip both hands and resistance to push/pull B/L LE. Pure wick in place, skin intact , pain 0/10, patient drowsy but arousal by voice is consistent. Attempted to give oral medications but patient too drowsy to swallow liquid at this time.

## 2022-04-01 NOTE — ED Provider Notes (Signed)
[facility] Provider Note  CSN: [id number] Arrival date & time: 04/01/22 1204  Chief Complaint(s) Edema  HPI Patricia Bass is a 62 y.o. female with PMH COPD on 2 L home O2, T2DM, hypothyroidism, restless leg syndrome, sleep apnea who presents emergency room for evaluation of hypoxia, lower extremity edema and shortness of breath.  Patient states that she has been working with her outpatient providers taking oral Lasix at home but her swelling has not improved.  She states she has not been urinating more frequently than normal.  Patient states shortness of breath significant worsened today and patient was found to be 55% on room air at home smoking a cigarette.  Patient with wheezing and crackles in the field with lower extremity edema and patient received 1 DuoNeb prior to arrival.  Here in the emergency department while on the DuoNeb with nonrebreather at 15 L patient not persistently hypoxic.  Denies chest pain, abdominal pain, nausea, vomiting, headache, fever or other systemic symptoms.   Past Medical History Past Medical History:  Diagnosis Date   Anemia    Anxiety    Arthritis    Bulging disc    Carpal tunnel syndrome    Cervicalgia    Chronic airway obstruction (HCC)    Chronic back pain    COPD (chronic obstructive pulmonary disease) (HCC)    Coronary artery disease    Diabetes mellitus ([place]) 01/11/2012   GERD (gastroesophageal reflux disease)    Headache(784.0)    Hypothyroid    Lumbago    Migraine    Myalgia and myositis, unspecified    Neuropathy    Pneumonia    Restless leg syndrome    Shortness of breath    Sleep apnea    uses O2 at home. uses it at home all times and at night at 2 liters.   Patient Active Problem List   Diagnosis Date Noted   Right middle lobe pneumonia 11/11/2017   Abdominal pain, epigastric 10/27/2016   IDA (iron deficiency anemia) 10/27/2016   GERD (gastroesophageal reflux disease) 10/27/2016    Esophageal dysphagia 10/27/2016   Acute respiratory failure with hypoxia ([place]) 07/08/2016   Acute respiratory failure with hypercapnia (HCC) 07/08/2016   Cellulitis 07/07/2016   Cellulitis of great toe 07/07/2016   CAP (community acquired pneumonia) 06/20/2016   Respiratory failure, acute and chronic ([place]) 06/20/2016   Hypokalemia 06/20/2016   Carpal tunnel syndrome    Migraine    Hypothyroid    Myalgia and myositis, unspecified    Lumbago    Chronic airway obstruction (HCC)    Cervicalgia    Anxiety    Diabetes mellitus ([place]) 01/11/2012   S/P cubital tunnel release 04/14/2011   Ulnar nerve compression 04/14/2011   Pain in joint, forearm 04/14/2011   Home Medication(s) Prior to Admission medications   Medication Sig Start Date End Date Taking? Authorizing Provider  albuterol (PROVENTIL HFA;VENTOLIN HFA) 108 (90 BASE) MCG/ACT inhaler Inhale 2 puffs into the lungs every 6 (six) hours as needed for wheezing or shortness of breath.     [provider]  aspirin 81 MG chewable tablet Chew 81 mg 2 (two) times a week by mouth.     [provider]  atorvastatin (LIPITOR) 40 MG tablet Take 40 mg by mouth every morning.     [provider]  cephALEXin (KEFLEX) 500 MG capsule Take 1 capsule (500 mg total) by mouth 4 (four) times daily. 06/08/20   Thamas Jaegers  S, MD  cetirizine (ZYRTEC) 10 MG tablet Take 10 mg daily by mouth.     Sinda Du, MD  CHANTIX 1 MG tablet Take 1 tablet by mouth 2 (two) times daily. 10/19/16   [provider]  cilostazol (PLETAL) 50 MG tablet Take 50 mg by mouth 2 (two) times daily. 01/23/19   [provider]  doxepin (SINEQUAN) 100 MG capsule Take 100 mg by mouth at bedtime.    [provider]  doxycycline (VIBRA-TABS) 100 MG tablet Take 1 tablet (100 mg total) by mouth 2 (two) times daily. 11/24/20   Trula Slade, DPM  doxycycline (VIBRA-TABS) 100 MG tablet Take 1 tablet (100 mg total) by mouth 2 (two)  times daily. 01/08/21   Trula Slade, DPM  ibuprofen (ADVIL,MOTRIN) 800 MG tablet Take 800 mg by mouth 3 (three) times daily.    [provider]  levothyroxine (SYNTHROID, LEVOTHROID) 75 MCG tablet Take 1 tablet (75 mcg total) by mouth daily before breakfast. 09/05/14   Davonna Belling, MD  lisinopril (PRINIVIL,ZESTRIL) 2.5 MG tablet Take 2.5 mg at bedtime by mouth.     [provider]  metFORMIN (GLUCOPHAGE) 500 MG tablet Take 1 tablet (500 mg total) by mouth 2 (two) times daily with a meal. 09/05/14   Davonna Belling, MD  mometasone (NASONEX) 50 MCG/ACT nasal spray Place 2 sprays into the nose daily as needed (for rhinitis).    [provider]  naphazoline-glycerin (CLEAR EYES) 0.012-0.2 % SOLN Place 1-2 drops every 4 (four) hours as needed into both eyes for eye irritation.     [provider]  NEUPRO 2 MG/24HR 1 patch at bedtime. 02/07/19   [provider]  omega-3 acid ethyl esters (LOVAZA) 1 g capsule Take 2 g 2 (two) times daily by mouth.     [provider]  omeprazole (PRILOSEC) 40 MG capsule Take 1 capsule (40 mg total) by mouth daily. Patient taking differently: Take 40 mg by mouth daily as needed (for acid reflux).  09/05/14   Davonna Belling, MD  Oxycodone HCl 10 MG TABS Take 1 tablet by mouth every 4-6 hours as needed for pain 09/27/18   [provider]  polyethylene glycol (MIRALAX / GLYCOLAX) packet Take 17 g by mouth daily as needed for mild constipation.    [provider]  pregabalin (LYRICA) 300 MG capsule Take 1 capsule (300 mg total) by mouth 2 (two) times daily. 09/05/14   Davonna Belling, MD  silver sulfADIAZINE (SILVADENE) 1 % cream Apply 1 application topically daily. 01/08/21   Trula Slade, DPM  tiZANidine (ZANAFLEX) 4 MG tablet Take 4 mg by mouth 3 (three) times daily. 03/05/19   [provider]  valACYclovir (VALTREX) 500 MG tablet Take 500 mg by mouth 2 (two) times daily as needed.      Sinda Du, MD  Past Surgical History Past Surgical History:  Procedure Laterality Date   BACK SURGERY     BIOPSY  12/06/2016   Procedure: BIOPSY;  Surgeon: Daneil Dolin, MD;  [facility];  Service: Endoscopy;;  gastric    BLADDER SUSPENSION     CARDIAC CATHETERIZATION     CHOLECYSTECTOMY     COLONOSCOPY WITH PROPOFOL N/A 12/06/2016   Procedure: COLONOSCOPY WITH PROPOFOL  Formed stool could not do;  Surgeon: Daneil Dolin, MD;  [facility]: [facility];  Service: Endoscopy;  Laterality: N/A;  10:00am   ESOPHAGOGASTRODUODENOSCOPY (EGD) WITH PROPOFOL N/A 12/06/2016   Procedure: ESOPHAGOGASTRODUODENOSCOPY (EGD) WITH PROPOFOL;  Surgeon: Daneil Dolin, MD;  [facility];  Service: Endoscopy;  Laterality: N/A;   MALONEY DILATION N/A 12/06/2016   Procedure: Venia Minks DILATION;  Surgeon: Daneil Dolin, MD;  Location: [facility];  Service: Endoscopy;  Laterality: N/A;   TUBAL LIGATION     ULNAR NERVE TRANSPOSITION  04/01/2011   Procedure: ULNAR NERVE DECOMPRESSION/TRANSPOSITION;  Surgeon: Roseanne Kaufman, MD;  Location: [facility];  Service: Orthopedics;  Laterality: Left;  ULNAR NERVE RELEASE WITH ANTERIOR TRANS POSITION REPAIR RECONSTRUCTION AS NECESSARY   Family History Family History  Problem Relation Age of Onset   Colon cancer Neg Hx    Celiac disease Neg Hx     Social History Social History   Tobacco Use   Smoking status: Every Day    Packs/day: 1.00    Years: 20.00    Additional pack years: 0.00    Total pack years: 20.00    Types: Cigarettes, E-cigarettes   Smokeless tobacco: Never  Vaping Use   Vaping Use: Never used  Substance Use Topics   Alcohol use: No   Drug use: Yes    Types: Marijuana, Oxycodone    Comment: last 2020   Allergies Duragesic disc transdermal system [fentanyl]  Review of Systems Review  of Systems  Respiratory:  Positive for cough, chest tightness, shortness of breath and wheezing.   Cardiovascular:  Positive for leg swelling.    Physical Exam Vital Signs  I have reviewed the triage vital signs BP (!) 107/95 (BP Location: Right Arm)   Pulse 90   Temp 99 F (37.2 C) (Oral)   Resp (!) 27   Ht 5\' 1"  (1.549 m)   Wt 71.7 kg   SpO2 (!) 89%   BMI 29.85 kg/m   Physical Exam Vitals and nursing note reviewed.  Constitutional:      General: She is not in acute distress.    Appearance: She is well-developed. She is ill-appearing.  HENT:     Head: Normocephalic and atraumatic.  Eyes:     Conjunctiva/sclera: Conjunctivae normal.  Cardiovascular:     Rate and Rhythm: Normal rate and regular rhythm.     Heart sounds: No murmur heard. Pulmonary:     Breath sounds: Wheezing present.  Abdominal:     Palpations: Abdomen is soft.     Tenderness: There is no abdominal tenderness.  Musculoskeletal:        General: No swelling.     Cervical back: Neck supple.  Skin:    General: Skin is warm and dry.     Capillary Refill: Capillary refill takes less than 2 seconds.  Neurological:     Mental Status: She is alert.  Psychiatric:        Mood and Affect: Mood normal.     ED Results and Treatments Labs (all labs ordered are  listed, but only abnormal results are displayed) Labs Reviewed  COMPREHENSIVE METABOLIC PANEL - Abnormal; Notable for the following components:      Result Value   Sodium 125 (*)    Potassium 3.3 (*)    Chloride 84 (*)    Glucose, Bld 109 (*)    Calcium 8.1 (*)    Albumin 3.2 (*)    All other components within normal limits  CBC WITH DIFFERENTIAL/PLATELET - Abnormal; Notable for the following components:   Hemoglobin 10.3 (*)    HCT 35.6 (*)    MCH 23.7 (*)    MCHC 28.9 (*)    RDW 22.1 (*)    nRBC 0.5 (*)    Abs Immature Granulocytes 0.09 (*)    All other components within normal limits  BRAIN NATRIURETIC PEPTIDE - Abnormal; Notable for  the following components:   B Natriuretic Peptide 589.0 (*)    All other components within normal limits  TROPONIN I (HIGH SENSITIVITY)  TROPONIN I (HIGH SENSITIVITY)                                                                                                                          Radiology DG Chest Portable 1 View  Result Date: 04/01/2022 CLINICAL DATA:  dyspnea EXAM: PORTABLE CHEST 1 VIEW COMPARISON:  Radiograph 02/02/2018 FINDINGS: Markedly enlarged cardiac silhouette, appears increased in size in comparison to prior radiograph. There are mild interstitial opacities bilaterally with bibasilar airspace disease and small pleural effusions, right greater than left. No evidence of pneumothorax. No acute osseous abnormality. IMPRESSION: Markedly enlarged cardiac silhouette consistent with cardiomegaly and/or pericardial effusion, correlate with ECHO. Mild pulmonary edema with small pleural effusions, right greater than left. Electronically Signed   By: Maurine Simmering M.D.   On: 04/01/2022 12:33    Pertinent labs & imaging results that were available during my care of the patient were reviewed by me and considered in my medical decision making (see MDM for details).  Medications Ordered in ED Medications  methylPREDNISolone sodium succinate (SOLU-MEDROL) 125 mg/2 mL injection 125 mg (125 mg Intravenous Given 04/01/22 1223)  ipratropium-albuterol (DUONEB) 0.5-2.5 (3) MG/3ML nebulizer solution 6 mL (6 mLs Nebulization Given 04/01/22 1236)  furosemide (LASIX) injection 40 mg (40 mg Intravenous Given 04/01/22 1301)  Procedures .Critical Care  Performed by: Teressa Lower, MD Authorized by: Teressa Lower, MD   Critical care provider statement:    Critical care time (minutes):  30   Critical care was necessary to treat or prevent imminent or life-threatening  deterioration of the following conditions:  Respiratory failure   Critical care was time spent personally by me on the following activities:  Development of treatment plan with patient or surrogate, discussions with consultants, evaluation of patient's response to treatment, examination of patient, ordering and review of laboratory studies, ordering and review of radiographic studies, ordering and performing treatments and interventions, pulse oximetry, re-evaluation of patient's condition and review of old charts   (including critical care time)  Medical Decision Making / ED Course   This patient presents to the ED for concern of shortness of breath, this involves an extensive number of treatment options, and is a complaint that carries with it a high risk of complications and morbidity.  The differential diagnosis includes Pe, PTX, Pulmonary Edema, ARDS, COPD/Asthma, ACS, CHF exacerbation, Arrhythmia, Pericardial Effusion/Tamponade, Anemia, Sepsis, Acidosis/Hypercapnia, Anxiety, Viral URI  MDM: Patient seen emergency room for evaluation of shortness of breath and lower extremity edema.  Physical exam reveals bilateral and aspiratory wheezing in the upper lobes, rales in the bases, 2+ pitting edema bilaterally in the lower extremities.  Laboratory evaluation with a hemoglobin of 10.3, sodium significantly decreased at 125, potassium 3.3, chloride 84 all likely decreased in the setting of diuresis.  BNP elevated to 589.0, high-sensitivity troponin normal at 11.  Chest x-ray with significant cardiomegaly and mild pulmonary edema.  ECG without evidence of ischemia.  Patient received 2 additional DuoNebs and methylprednisolone and wheezing improved significantly.  However, when attempting to ambulate to the bathroom, patient dropped her oxygen saturations to 55% again.  At that point a PE study was obtained that shows cardiac enlargement with a pericardial effusion but does not show evidence of pulmonary  embolism.  There is also interstitial edema and atelectasis as well as reactive lymphadenopathy, ascites and a 3 mm pulmonary nodule.  Diuresis begun and patient admitted for CHF exacerbation and hypoxia   Additional history obtained:  -External records from outside source obtained and reviewed including: Chart review including previous notes, labs, imaging, consultation notes   Lab Tests: -I ordered, reviewed, and interpreted labs.   The pertinent results include:   Labs Reviewed  COMPREHENSIVE METABOLIC PANEL - Abnormal; Notable for the following components:      Result Value   Sodium 125 (*)    Potassium 3.3 (*)    Chloride 84 (*)    Glucose, Bld 109 (*)    Calcium 8.1 (*)    Albumin 3.2 (*)    All other components within normal limits  CBC WITH DIFFERENTIAL/PLATELET - Abnormal; Notable for the following components:   Hemoglobin 10.3 (*)    HCT 35.6 (*)    MCH 23.7 (*)    MCHC 28.9 (*)    RDW 22.1 (*)    nRBC 0.5 (*)    Abs Immature Granulocytes 0.09 (*)    All other components within normal limits  BRAIN NATRIURETIC PEPTIDE - Abnormal; Notable for the following components:   B Natriuretic Peptide 589.0 (*)    All other components within normal limits  TROPONIN I (HIGH SENSITIVITY)  TROPONIN I (HIGH SENSITIVITY)      EKG   EKG Interpretation  Date/Time:  Thursday April 01 2022 12:10:39 EDT Ventricular Rate:  90 PR Interval:  165 QRS  Duration: 132 QT Interval:  406 QTC Calculation: 497 R Axis:   -84 Text Interpretation: Sinus rhythm Probable left atrial enlargement Right bundle branch block Confirmed by [place] ([id number]) on 04/01/2022 1:58:04 PM         Imaging Studies ordered: I ordered imaging studies including CXR, CTPE I independently visualized and interpreted imaging. I agree with the radiologist interpretation   Medicines ordered and prescription drug management: Meds ordered this encounter  Medications   methylPREDNISolone sodium succinate  (SOLU-MEDROL) 125 mg/2 mL injection 125 mg   ipratropium-albuterol (DUONEB) 0.5-2.5 (3) MG/3ML nebulizer solution 6 mL   furosemide (LASIX) injection 40 mg    -I have reviewed the patients home medicines and have made adjustments as needed  Critical interventions Multiple DuoNebs, steroids, diuresis, supplemental oxygen    Cardiac Monitoring: The patient was maintained on a cardiac monitor.  I personally viewed and interpreted the cardiac monitored which showed an underlying rhythm of: NSR  Social Determinants of Health:  Factors impacting patients care include: none   Reevaluation: After the interventions noted above, I reevaluated the patient and found that they have :improved  Co morbidities that complicate the patient evaluation  Past Medical History:  Diagnosis Date   Anemia    Anxiety    Arthritis    Bulging disc    Carpal tunnel syndrome    Cervicalgia    Chronic airway obstruction (HCC)    Chronic back pain    COPD (chronic obstructive pulmonary disease) (HCC)    Coronary artery disease    Diabetes mellitus ([place]) 01/11/2012   GERD (gastroesophageal reflux disease)    Headache(784.0)    Hypothyroid    Lumbago    Migraine    Myalgia and myositis, unspecified    Neuropathy    Pneumonia    Restless leg syndrome    Shortness of breath    Sleep apnea    uses O2 at home. uses it at home all times and at night at 2 liters.      Dispostion: I considered admission for this patient, and due to increasing oxygen requirements, persistent hypoxia and CHF exacerbation, patient require hospital admission     Final Clinical Impression(s) / ED Diagnoses Final diagnoses:  Hypoxia     @PCDICTATION @    Teressa Lower, MD 04/01/22 [phone]

## 2022-04-01 NOTE — Progress Notes (Signed)
Patient transported from ED 1 to ICU 9 on BIPAP with no adverse events noted. Check and neb treatment done. Report given to S. Pulliam RRT covering ICU.

## 2022-04-01 NOTE — H&P (Addendum)
History and Physical    Patient: Patricia Bass [id number] DOB: 11/07/1960 DOA: 04/01/2022 DOS: the patient was seen and examined on 04/01/2022 PCP: Lindell Spar, MD  Patient coming from: Home  Chief Complaint:  Chief Complaint  Patient presents with   Edema   HPI: Patricia Bass is a 62 y.o. female with medical history significant of hypertension, hyperlipidemia, chronic respiratory failure who is supposed to be on supplemental oxygen at 2 LPM (patient had not been compliant with home oxygen for 1 year), COPD, hypothyroidism, GERD, type 2 diabetes mellitus who presents to the emergency department via EMS from home due to shortness of breath and increased leg swelling.  Patient complained of increased leg swelling that has been ongoing for about a month, she states that she has been following up with her PCP and was placed on Lasix and antibiotics for UTI. Leg swelling did not improve.  Patient also endorsed increased shortness of breath within the same timeframe with difficulty in laying flat in bed and she now uses 3 pillows (1 pillow at baseline) to sleep.  Regarding the leg swelling, she complained of weakness in the legs with increased difficulty in ambulation. Patient's mom and brother noted increased shortness of breath since last night breath, but this worsened today, so, EMS was activated and on arrival of EMS team, patient was noted to have O2 sat of 55-60 % on room air and was smoking cigarettes, she was noted with rales, wheezing and increased leg swelling, DuoNeb treatment x 1 was provided, supplemental oxygen was increased and patient was sent to the ED for further evaluation and management.  ED Course:  In the emergency department, BP was 107/95, respiratory rate was 27/min, O2 sat was 89% on supplemental oxygen at 2 LPM, pulse 90 bpm, temperature 99 F.  Workup in the ED showed normocytic anemia.  BMP showed sodium 125, potassium 3.3, chloride 84, bicarb 30, blood glucose  109, BUN 13, creatinine 0.87, albumin 3.2, troponin x 2 was flat at 11, BNP 589. Chest x-ray showed markedly enlarged cardiac silhouette consistent with cardiomegaly and/or pericardial effusion, correlate with ECHO.  Mild pulmonary edema with small pleural effusions, right greater than left. CT angiography chest with contrast showed No evidence for acute pulmonary embolus. 2. Cardiac enlargement with pericardial effusion. 3. Small partially loculated right pleural effusion overlies the posterior and medial right lower lobe. 4. Mild interlobular septal thickening with a lower lung zone predominance. Findings are suggestive of mild interstitial edema.  5. Posterior consolidative change within both lower lobes is favored to represent atelectasis. Pneumonia not excluded 6. Enlarged mediastinal and right hilar lymph nodes are nonspecific  and may be reactive in the setting of CHF and/or pneumonia. 7. Ascites. 8. Right solid pulmonary nodule within the upper lobe measuring 3 mm. Per Fleischner Society Guidelines, a non-contrast Chest CT at 12 months is optional. If performed and the nodule is stable at 12 months, no further follow-up is recommended. These guidelines do not apply to immunocompromised patients and patients with cancer. Follow up in patients with significant comorbidities as clinically warranted. For lung cancer screening, adhere to Lung-RADS guidelines. Reference: Radiology. 2017; 284(1):228-43. No 9. Aortic Atherosclerosis   Review of Systems: Review of systems as noted in the HPI. All other systems reviewed and are negative.   Past Medical History:  Diagnosis Date   Anemia    Anxiety    Arthritis    Bulging disc    Carpal tunnel syndrome  Cervicalgia    Chronic airway obstruction (HCC)    Chronic back pain    COPD (chronic obstructive pulmonary disease) (HCC)    Coronary artery disease    Diabetes mellitus ([place]) 01/11/2012   GERD (gastroesophageal reflux disease)     Headache(784.0)    Hypothyroid    Lumbago    Migraine    Myalgia and myositis, unspecified    Neuropathy    Pneumonia    Restless leg syndrome    Shortness of breath    Sleep apnea    uses O2 at home. uses it at home all times and at night at 2 liters.   Past Surgical History:  Procedure Laterality Date   BACK SURGERY     BIOPSY  12/06/2016   Procedure: BIOPSY;  Surgeon: Daneil Dolin, MD;  [facility];  Service: Endoscopy;;  gastric    BLADDER SUSPENSION     CARDIAC CATHETERIZATION     CHOLECYSTECTOMY     COLONOSCOPY WITH PROPOFOL N/A 12/06/2016   Procedure: COLONOSCOPY WITH PROPOFOL  Formed stool could not do;  Surgeon: Daneil Dolin, MD;  Location: [facility];  Service: Endoscopy;  Laterality: N/A;  10:00am   ESOPHAGOGASTRODUODENOSCOPY (EGD) WITH PROPOFOL N/A 12/06/2016   Procedure: ESOPHAGOGASTRODUODENOSCOPY (EGD) WITH PROPOFOL;  Surgeon: Daneil Dolin, MD;  [facility];  Service: Endoscopy;  Laterality: N/A;   MALONEY DILATION N/A 12/06/2016   Procedure: Venia Minks DILATION;  Surgeon: Daneil Dolin, MD;  [facility]: [facility];  Service: Endoscopy;  Laterality: N/A;   TUBAL LIGATION     ULNAR NERVE TRANSPOSITION  04/01/2011   Procedure: ULNAR NERVE DECOMPRESSION/TRANSPOSITION;  Surgeon: Roseanne Kaufman, MD;  Location: [place];  Service: Orthopedics;  Laterality: Left;  ULNAR NERVE RELEASE WITH ANTERIOR TRANS POSITION REPAIR RECONSTRUCTION AS NECESSARY    Social History:  reports that she has been smoking cigarettes and e-cigarettes. She has a 20.00 pack-year smoking history. She has never used smokeless tobacco. She reports current drug use. Drugs: Marijuana and Oxycodone. She reports that she does not drink alcohol.   Allergies  Allergen Reactions   Duragesic Disc Transdermal System [Fentanyl] Nausea And Vomiting    Family History  Problem Relation Age of Onset   Colon cancer Neg Hx    Celiac disease Neg Hx     Prior to Admission  medications   Medication Sig Start Date End Date Taking? Authorizing Provider  albuterol (PROVENTIL HFA;VENTOLIN HFA) 108 (90 BASE) MCG/ACT inhaler Inhale 2 puffs into the lungs every 6 (six) hours as needed for wheezing or shortness of breath.    Yes [provider]  aspirin 81 MG chewable tablet Chew 81 mg 2 (two) times a week by mouth.     [provider]  atorvastatin (LIPITOR) 40 MG tablet Take 40 mg by mouth every morning.     [provider]  cephALEXin (KEFLEX) 500 MG capsule Take 1 capsule (500 mg total) by mouth 4 (four) times daily. 06/08/20   Luna Fuse, MD  cetirizine (ZYRTEC) 10 MG tablet Take 10 mg daily by mouth.     Sinda Du, MD  CHANTIX 1 MG tablet Take 1 tablet by mouth 2 (two) times daily. 10/19/16   [provider]  cilostazol (PLETAL) 50 MG tablet Take 50 mg by mouth 2 (two) times daily. 01/23/19   [provider]  doxepin (SINEQUAN) 100 MG capsule Take 100 mg by mouth at bedtime.    [provider]  doxycycline (VIBRA-TABS) 100 MG tablet Take 1 tablet (100 mg total) by mouth 2 (two) times daily. 11/24/20   Trula Slade, DPM  doxycycline (VIBRA-TABS) 100 MG tablet Take 1 tablet (100 mg total) by mouth 2 (two) times daily. 01/08/21   Trula Slade, DPM  ibuprofen (ADVIL,MOTRIN) 800 MG tablet Take 800 mg by mouth 3 (three) times daily.    [provider]  levothyroxine (SYNTHROID, LEVOTHROID) 75 MCG tablet Take 1 tablet (75 mcg total) by mouth daily before breakfast. 09/05/14   Davonna Belling, MD  lisinopril (PRINIVIL,ZESTRIL) 2.5 MG tablet Take 2.5 mg at bedtime by mouth.     [provider]  metFORMIN (GLUCOPHAGE) 500 MG tablet Take 1 tablet (500 mg total) by mouth 2 (two) times daily with a meal. 09/05/14   Davonna Belling, MD  mometasone (NASONEX) 50 MCG/ACT nasal spray Place 2 sprays into the nose daily as needed (for rhinitis).    [provider]  naphazoline-glycerin (CLEAR  EYES) 0.012-0.2 % SOLN Place 1-2 drops every 4 (four) hours as needed into both eyes for eye irritation.     [provider]  NEUPRO 2 MG/24HR 1 patch at bedtime. 02/07/19   [provider]  omega-3 acid ethyl esters (LOVAZA) 1 g capsule Take 2 g 2 (two) times daily by mouth.     [provider]  omeprazole (PRILOSEC) 40 MG capsule Take 1 capsule (40 mg total) by mouth daily. Patient taking differently: Take 40 mg by mouth daily as needed (for acid reflux).  09/05/14   Davonna Belling, MD  Oxycodone HCl 10 MG TABS Take 1 tablet by mouth every 4-6 hours as needed for pain 09/27/18   [provider]  polyethylene glycol (MIRALAX / GLYCOLAX) packet Take 17 g by mouth daily as needed for mild constipation.    [provider]  pregabalin (LYRICA) 300 MG capsule Take 1 capsule (300 mg total) by mouth 2 (two) times daily. 09/05/14   Davonna Belling, MD  silver sulfADIAZINE (SILVADENE) 1 % cream Apply 1 application topically daily. 01/08/21   Trula Slade, DPM  tiZANidine (ZANAFLEX) 4 MG tablet Take 4 mg by mouth 3 (three) times daily. 03/05/19   [provider]  valACYclovir (VALTREX) 500 MG tablet Take 500 mg by mouth 2 (two) times daily as needed.     Sinda Du, MD    Physical Exam: BP 137/89   Pulse 90   Temp 99 F (37.2 C) (Oral)   Resp (!) 27   Ht 5\' 1"  (1.549 m)   Wt 71.7 kg   SpO2 95%   BMI 29.85 kg/m   General: 62 y.o. year-old female ill appearing, but in no acute distress.  Alert and oriented x3. HEENT: NCAT, EOMI Neck: Supple, trachea medial Cardiovascular: Regular rate and rhythm with no rubs or gallops.  No thyromegaly or JVD noted. +2 lower extremity edema. 2/4 pulses in all 4 extremities. Respiratory: Expiratory wheezes worse in upper lobes and bilateral Rales in lower lobes on auscultation. Abdomen: Soft, nontender nondistended with normal bowel sounds x4 quadrants. Muskuloskeletal: No cyanosis, clubbing or edema noted  bilaterally Neuro: CN II-XII intact, strength 5/5 x 4, sensation, reflexes intact Skin: No ulcerative lesions noted or rashes Psychiatry: Mood is appropriate for condition and setting          Labs on Admission:  Basic Metabolic Panel: Recent Labs  Lab 04/01/22 1228  NA 125*  K 3.3*  CL 84*  CO2 30  GLUCOSE 109*  BUN 13  CREATININE 0.87  CALCIUM 8.1*   Liver Function Tests: Recent Labs  Lab 04/01/22 1228  AST 23  ALT 17  ALKPHOS 92  BILITOT 0.7  PROT 7.0  ALBUMIN 3.2*   No results for input(s): "LIPASE", "AMYLASE" in the last 168 hours. No results for input(s): "AMMONIA" in the last 168 hours. CBC: Recent Labs  Lab 04/01/22 1228  WBC 7.9  NEUTROABS 5.7  HGB 10.3*  HCT 35.6*  MCV 82.0  PLT 203   Cardiac Enzymes: No results for input(s): "CKTOTAL", "CKMB", "CKMBINDEX", "TROPONINI" in the last 168 hours.  BNP (last 3 results) Recent Labs    04/01/22 1228  BNP 589.0*    ProBNP (last 3 results) No results for input(s): "PROBNP" in the last 8760 hours.  CBG: No results for input(s): "GLUCAP" in the last 168 hours.  Radiological Exams on Admission: CT Angio Chest PE W and/or Wo Contrast  Result Date: 04/01/2022 CLINICAL DATA:  Evaluate for pulmonary embolism. Complains of swelling in legs and face. EXAM: CT ANGIOGRAPHY CHEST WITH CONTRAST TECHNIQUE: Multidetector CT imaging of the chest was performed using the standard protocol during bolus administration of intravenous contrast. Multiplanar CT image reconstructions and MIPs were obtained to evaluate the vascular anatomy. RADIATION DOSE REDUCTION: This exam was performed according to the departmental dose-optimization program which includes automated exposure control, adjustment of the mA and/or kV according to patient size and/or use of iterative reconstruction technique. CONTRAST:  61mL OMNIPAQUE IOHEXOL 350 MG/ML SOLN COMPARISON:  None Available. FINDINGS: Cardiovascular: Satisfactory opacification of the  pulmonary arteries to the segmental level. No evidence of pulmonary embolism. Cardiac enlargement. Pericardial effusion identified. Aortic atherosclerosis and coronary artery calcification. Mediastinum/Nodes: Thyroid gland, trachea, and esophagus are unremarkable. No enlarged axillary lymph nodes. Right paratracheal node measures 1.8 cm, image 31/4. Left paratracheal lymph node measures 1.6 cm, image 31/4. Right hilar node measures 1.1 cm, image 54/4. Lungs/Pleura: Centrilobular and paraseptal emphysema. Tiny nodule within the periphery of the right upper lobe measures 3 mm, image 67/6. Small partially loculated right pleural effusion overlies the posterior and medial right lower lobe. Interlobular septal thickening is identified within both lung bases. Mild interlobular septal thickening is noted with a lower lung zone predominance. Posterior airspace consolidation is noted within both lower lobes. Upper Abdomen: Perihepatic ascites and perisplenic ascites identified. Status post cholecystectomy. Aortic atherosclerotic calcifications. Musculoskeletal: No acute or suspicious osseous findings. Status post ACDF. Lower thoracic spine degenerative disc disease. Review of the MIP images confirms the above findings. IMPRESSION: 1. No evidence for acute pulmonary embolus. 2. Cardiac enlargement with pericardial effusion. 3. Small partially loculated right pleural effusion overlies the posterior and medial right lower lobe. 4. Mild interlobular septal thickening with a lower lung zone predominance. Findings are suggestive of mild interstitial edema. 5. Posterior consolidative change within both lower lobes is favored to represent atelectasis. Pneumonia not excluded 6. Enlarged mediastinal and right hilar lymph nodes are nonspecific and may be reactive in the setting of CHF and/or pneumonia. 7. Ascites. 8. Right solid pulmonary nodule within the upper lobe measuring 3 mm. Per Fleischner Society Guidelines, a non-contrast  Chest CT at 12 months is optional. If performed and the nodule is stable at 12 months, no further follow-up is recommended. These guidelines do not apply to immunocompromised patients and patients with cancer. Follow up in patients with significant comorbidities as clinically warranted. For lung cancer screening, adhere to Lung-RADS guidelines. Reference: Radiology. 2017; 284(1):228-43. No 9. Aortic Atherosclerosis (ICD10-I70.0) and Emphysema (ICD10-J43.9).  Electronically Signed   By: Kerby Moors M.D.   On: 04/01/2022 14:56   DG Chest Portable 1 View  Result Date: 04/01/2022 CLINICAL DATA:  dyspnea EXAM: PORTABLE CHEST 1 VIEW COMPARISON:  Radiograph 02/02/2018 FINDINGS: Markedly enlarged cardiac silhouette, appears increased in size in comparison to prior radiograph. There are mild interstitial opacities bilaterally with bibasilar airspace disease and small pleural effusions, right greater than left. No evidence of pneumothorax. No acute osseous abnormality. IMPRESSION: Markedly enlarged cardiac silhouette consistent with cardiomegaly and/or pericardial effusion, correlate with ECHO. Mild pulmonary edema with small pleural effusions, right greater than left. Electronically Signed   By: Maurine Simmering M.D.   On: 04/01/2022 12:33    EKG: I independently viewed the EKG done and my findings are as followed: Normal sinus rhythm at rate of 90 bpm with RBBB  Assessment/Plan Present on Admission:  Acute on chronic respiratory failure with hypoxia ([place])  COPD with acute exacerbation (HCC)  Hypokalemia  GERD (gastroesophageal reflux disease)  Acquired hypothyroidism  Principal Problem:   Acute on chronic respiratory failure with hypoxia (HCC) Active Problems:   Acquired hypothyroidism   COPD with acute exacerbation (HCC)   Type 2 diabetes mellitus (HCC)   Hypokalemia   GERD (gastroesophageal reflux disease)   Congestive heart failure (CHF) (HCC)   Pericardial effusion   Hyponatremia   Essential  hypertension   Mixed hyperlipidemia   Tobacco abuse   Personal history of noncompliance with medical treatment, presenting hazards to health   Pulmonary nodule  Acute on chronic respiratory failure with hypoxia ABG: pH 7.42, pCO2 60, pO2 55, Bicarbonate 38.3, Acid-Base Excess 11.8 O2sat 88.4% This is possibly secondary to multifactorial including congestive heart failure, pericardial effusion, noncompliance with oxygen use, COPD exacerbation. Patient will be placed on BiPAP with plan to wean patient off this and transition to  supplemental oxygen to maintain O2 sat > 92%.  Congestive heart failure Elevated BNP (589) Pericardial effusion, pleural effusion Patient denies history of CHF Chest x-ray suggestive of pericardial effusion, mild pulmonary edema with small pleural effusions, right greater than left. CT angiography of chest confirmed chest x-ray findings Continue total input/output, daily weights and fluid restriction Continue IV Lasix 40mg  twice daily Continue heart healthy/modified carb diet  Echocardiogram in the morning   COPD exacerbation Continue duo nebs, Mucinex, Solu-Medrol, azithromycin. Continue Protonix to prevent steroid-induced ulcer Continue incentive spirometry and flutter valve Continue supplemental oxygen to maintain O2 sat > 92% with plan to wean patient off oxygen as tolerated  Hyponatremia Na 125, this was possibly secondary to diuretic and volume overload effect Unfortunately, patient needs to be on Lasix at this time due to new onset CHF and volume overload Continue to monitor sodium level and consider need for hypertonic saline while on Lasix for sodium < 120  Hypokalemia K+ 3.3, this will be replenished  Hypoalbuminemia possibly secondary to mild protein calorie malnutrition Albumin 3.2, protein supplement will be provided  Pulmonary nodule CT angiography of chest showed Right solid pulmonary nodule within the upper lobe measuring 3 mm.  Per  Fleischner Society Guidelines, a non-contrast Chest CT at 12 months is optional. If performed and the nodule is stable at 12 months, no further follow-up is recommended. These guidelines do not apply to immunocompromised patients and patients with cancer. Follow up in patients with significant comorbidities as clinically warranted per radiology.  Essential hypertension Continue Lasix, lisinopril  Mixed hyperlipidemia Continue Lipitor, Lovaza  Type 2 diabetes mellitus Continue ISS and hypoglycemia protocol Metformin will  be held at this time Hemoglobin A1c will be checked  Acquired hypothyroidism Continue Synthroid  GERD Continue Protonix  Noncompliance with treatment regimen Patient has not been using home oxygen for 1 year, she was counseled on importance of being compliant with treatment regimen  Tobacco abuse Patient was counseled on tobacco abuse cessation, continue Chantix  DVT prophylaxis: Lovenox  Code Status: Full code  Family Communication: None at bedside  Consults: None  Severity of Illness: The appropriate patient status for this patient is INPATIENT. Inpatient status is judged to be reasonable and necessary in order to provide the required intensity of service to ensure the patient's safety. The patient's presenting symptoms, physical exam findings, and initial radiographic and laboratory data in the context of their chronic comorbidities is felt to place them at high risk for further clinical deterioration. Furthermore, it is not anticipated that the patient will be medically stable for discharge from the hospital within 2 midnights of admission.   * I certify that at the point of admission it is my clinical judgment that the patient will require inpatient hospital care spanning beyond 2 midnights from the point of admission due to high intensity of service, high risk for further deterioration and high frequency of surveillance required.*  Author: Bernadette Hoit,  DO 04/01/2022 5:09 PM  For on call review [email or web address].

## 2022-04-02 ENCOUNTER — Inpatient Hospital Stay (HOSPITAL_COMMUNITY): Payer: Medicare Other

## 2022-04-02 DIAGNOSIS — E1169 Type 2 diabetes mellitus with other specified complication: Secondary | ICD-10-CM

## 2022-04-02 DIAGNOSIS — I10 Essential (primary) hypertension: Secondary | ICD-10-CM | POA: Diagnosis not present

## 2022-04-02 DIAGNOSIS — E876 Hypokalemia: Secondary | ICD-10-CM

## 2022-04-02 DIAGNOSIS — I5021 Acute systolic (congestive) heart failure: Secondary | ICD-10-CM | POA: Diagnosis not present

## 2022-04-02 DIAGNOSIS — J449 Chronic obstructive pulmonary disease, unspecified: Secondary | ICD-10-CM | POA: Diagnosis not present

## 2022-04-02 DIAGNOSIS — I5033 Acute on chronic diastolic (congestive) heart failure: Secondary | ICD-10-CM

## 2022-04-02 DIAGNOSIS — E669 Obesity, unspecified: Secondary | ICD-10-CM | POA: Insufficient documentation

## 2022-04-02 DIAGNOSIS — E785 Hyperlipidemia, unspecified: Secondary | ICD-10-CM

## 2022-04-02 LAB — CBC
HCT: 37.5 % (ref 36.0–46.0)
Hemoglobin: 10.5 g/dL — ABNORMAL LOW (ref 12.0–15.0)
MCH: 23.6 pg — ABNORMAL LOW (ref 26.0–34.0)
MCHC: 28 g/dL — ABNORMAL LOW (ref 30.0–36.0)
MCV: 84.3 fL (ref 80.0–100.0)
Platelets: 182 10*3/uL (ref 150–400)
RBC: 4.45 MIL/uL (ref 3.87–5.11)
RDW: 22.5 % — ABNORMAL HIGH (ref 11.5–15.5)
WBC: 7.2 10*3/uL (ref 4.0–10.5)
nRBC: 0.4 % — ABNORMAL HIGH (ref 0.0–0.2)

## 2022-04-02 LAB — COMPREHENSIVE METABOLIC PANEL
ALT: 16 U/L (ref 0–44)
AST: 19 U/L (ref 15–41)
Albumin: 3 g/dL — ABNORMAL LOW (ref 3.5–5.0)
Alkaline Phosphatase: 87 U/L (ref 38–126)
Anion gap: 10 (ref 5–15)
BUN: 12 mg/dL (ref 8–23)
CO2: 33 mmol/L — ABNORMAL HIGH (ref 22–32)
Calcium: 8.8 mg/dL — ABNORMAL LOW (ref 8.9–10.3)
Chloride: 91 mmol/L — ABNORMAL LOW (ref 98–111)
Creatinine, Ser: 0.77 mg/dL (ref 0.44–1.00)
GFR, Estimated: >60 mL/min (ref >60)
Glucose, Bld: 114 mg/dL — ABNORMAL HIGH (ref 70–99)
Potassium: 3.8 mmol/L (ref 3.5–5.1)
Sodium: 134 mmol/L — ABNORMAL LOW (ref 135–145)
Total Bilirubin: 0.6 mg/dL (ref 0.3–1.2)
Total Protein: 6.7 g/dL (ref 6.5–8.1)

## 2022-04-02 LAB — GLUCOSE, CAPILLARY
Glucose-Capillary: 104 mg/dL — ABNORMAL HIGH (ref 70–99)
Glucose-Capillary: 152 mg/dL — ABNORMAL HIGH (ref 70–99)
Glucose-Capillary: 167 mg/dL — ABNORMAL HIGH (ref 70–99)
Glucose-Capillary: 123 mg/dL — ABNORMAL HIGH (ref 70–99)
Glucose-Capillary: 118 mg/dL — ABNORMAL HIGH (ref 70–99)

## 2022-04-02 LAB — MAGNESIUM: Magnesium: 2.2 mg/dL (ref 1.7–2.4)

## 2022-04-02 LAB — ECHOCARDIOGRAM COMPLETE
Area-P 1/2: 3.27 cm2
Height: 61 in
S' Lateral: 2.2 cm
Single Plane A2C EF: 55.1 %
Weight: 2528 oz

## 2022-04-02 LAB — PHOSPHORUS: Phosphorus: 4.5 mg/dL (ref 2.5–4.6)

## 2022-04-02 LAB — HIV ANTIBODY (ROUTINE TESTING W REFLEX): HIV Screen 4th Generation wRfx: NONREACTIVE

## 2022-04-02 MED ORDER — EMPAGLIFLOZIN 10 MG PO TABS
10.0000 mg | ORAL_TABLET | Freq: Every day | ORAL | Status: DC
  Administered 2022-04-02 – 2022-04-04 (×3): 10 mg via ORAL
  Filled 2022-04-02 (×5): qty 1

## 2022-04-02 MED ORDER — SPIRONOLACTONE 12.5 MG HALF TABLET
12.5000 mg | ORAL_TABLET | Freq: Every day | ORAL | Status: DC
  Administered 2022-04-02 – 2022-04-05 (×4): 12.5 mg via ORAL
  Filled 2022-04-02 (×4): qty 1

## 2022-04-02 MED ORDER — FUROSEMIDE 10 MG/ML IJ SOLN
20.0000 mg | Freq: Once | INTRAMUSCULAR | Status: AC
  Administered 2022-04-02: 20 mg via INTRAVENOUS
  Filled 2022-04-02: qty 2

## 2022-04-02 MED ORDER — FUROSEMIDE 10 MG/ML IJ SOLN
60.0000 mg | Freq: Two times a day (BID) | INTRAMUSCULAR | Status: DC
  Administered 2022-04-02 – 2022-04-04 (×4): 60 mg via INTRAVENOUS
  Filled 2022-04-02 (×4): qty 6

## 2022-04-02 NOTE — Progress Notes (Signed)
Went in to put patient on Bipap for the night, patient asked if we could hold off a little longer.  Asked patient what time, decided on 1 am, have to give patient Q6 treatment anyway so will place patient on at that time and give treatment.  RN made aware.

## 2022-04-02 NOTE — Progress Notes (Signed)
Progress Note   Patient: Patricia Bass [id number] DOB: 08-27-1960 DOA: 04/01/2022     1 DOS: the patient was seen and examined on 04/02/2022   Brief hospital course: Patricia Bass was admitted to the hospital with the working diagnosis of heart failure exacerbation.   62 yo female with the past medical history of hypertension, hyperlipidemia, COPD, (home 02), and T2DM who presented with dyspnea and edema. Reported worsening symptoms for the last 4 weeks, with lower extremity edema, dyspnea, PND and orthopnea. As outpatient she has been treated with furosemide and has received antibiotic therapy for urinary tract infection, with no improvement in her symptoms. Her family noted severe symptoms the night prior to admission, and EMS was called the following morning. Her 02 saturation was noted to be 55 to 60% she was placed on supplemental 02 and was transported to the ED. On her initial physical examination her blood pressure was 107/95, RR 27, HR 90, lungs with expiratory wheezing and rales, heart with S1 and S2 present and rhythmic, no JVD, abdomen with no distention, positive lower extremities.   Na 125, K 3,3 CL 84, bicarbonate 30, glucose 109, bun 13 cr 0,87  BNP 589  High sensitive troponin 11 and 11.  ABG 7,42/ 60/ 55/ 38/ 88.4% Wbc 7,9 hgb 10.3 plt 203   Chest radiograph right rotated, positive cardiomegaly with bilateral hilar congestion and bilateral interstitial infiltrates, bilateral small pleural effusions, more right than left.   CT chest with bilateral ground glass opacities, with bilateral pleural effusions, more right than left with bilateral basal atelectasis.  No pulmonary embolism, positive pericardial effusion. Ascites.   Right solid pulmonary nodule within the upper lobe measuring 3 mm. (Optional repeat CT in 12 mo recommended).   EKG 90 bpm, normal axis, right bundle branch block, sinus rhythm with right atrial enlargement, no significant ST segment or T wave changes.    Patient was placed on Bipap for respiratory distress, received IV furosemide and systemic steroids.        Assessment and Plan: * Acute on chronic diastolic CHF (congestive heart failure) (HCC) Pericardial effusion.   Patient continue hypervolemic.  Noted urine output close to 1 L this am on the canister.  Systolic blood pressure [id number] to 106 mmHg.   Plan to continue diuresis with IV furosemide, will increase dose to 60 mg IV q12 hrs.  Add spironolactone and SGLT 2 inh.  Hold on lisinopril, will transition to ARB during this hospitalization.  Follow up on echocardiogram.   Acute on chronic hypoxemic and hypercapnic respiratory failure.  Acute cardiogenic pulmonary edema, bilateral pleural effusions.  Plan to continue diuresis with furosemide. Trial of liberation from non invasive mechanical ventilation to nasal cannula, keep 02 saturation  88% or greater.   Essential hypertension Continue aggressive diuresis, will hold on lisinopril for now.  Will plan for ARB before discharge.   Hypokalemia Hypokalemia.   Renal function with serum cr at 0,77, K is 3,8 and serum bicarbonate at 33, Na 134 and Mg 2,2   Plan to add Kcl 40 meq x2 and follow up renal function in am.   COPD (chronic obstructive pulmonary disease) ([place]) Patient on supplemental home 02. No clinical signs of exacerbation.   Plan to discontinue steroids and continue with bronchodilator therapy. Continue oxymetry monitoring.   Type 2 diabetes mellitus (HCC) Fasting glucose this am 114.  Continue insulin sliding scale for glucose cover and monitoring.   Acquired hypothyroidism Continue with levothyroxine.   GERD (gastroesophageal reflux  disease) Continue with pantoprazole.   Type 2 diabetes mellitus with hyperlipidemia (HCC) Fasting glucose this am 114.  Continue insulin sliding scale for glucose cover and monitoring.  Continue with statin.   Tobacco abuse Smoking cessation counseling.   Class 1  obesity Calculated BMI is 30,8        Subjective: Patient with improvement in dyspnea but not back to baseline, no chest pain.   Physical Exam: Vitals:   04/02/22 0705 04/02/22 0715 04/02/22 0800 04/02/22 0826  BP:      Pulse:   76   Resp:   13 11  Temp:  97.9 F (36.6 C)    TempSrc:  Oral    SpO2:   95% 96%  Weight: 74.1 kg     Height:       Neurology somnolent but easy to arouse, follows commands and responds to simple questions ENT with mild pallor, no icterus Cardiovascular with S1 and S2 present and rhythmic with no gallops, rubs or murmurs Positive JVD Positive lower extremity edema  Respiratory with rales, but no wheezing or rhonchi Abdomen with no distention  Data Reviewed:    Family Communication: no family at the bedside   Disposition: Status is: Inpatient Remains inpatient appropriate because: IV diuresis and respiratory failure   Planned Discharge Destination: Home     Author: Tawni Millers, MD 04/02/2022 9:11 AM  For on call review [email or web address].

## 2022-04-02 NOTE — Assessment & Plan Note (Signed)
Patient on supplemental home 02. No clinical signs of exacerbation.   Plan to discontinue steroids and continue with bronchodilator therapy. Continue oxymetry monitoring.

## 2022-04-02 NOTE — Assessment & Plan Note (Signed)
Hyponatremia.    Patient is responding well to diuresis, renal function today with serum cr at 0,85, K is 3,0 and serum bicarbonate at 39. Na 137 Mg 2,1   Plan to add Kcl 40 meq x 2 doses Continue diuresis with furosemide, spironolactone and SGLT 2 inh.  Follow up renal function and electrolytes in am.
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
## 2022-04-02 NOTE — Assessment & Plan Note (Signed)
Continue with levothyroxine  

## 2022-04-02 NOTE — Assessment & Plan Note (Signed)
Calculated BMI is 30,8  

## 2022-04-02 NOTE — Hospital Course (Addendum)
Mrs. Hector was admitted to the hospital with the working diagnosis of heart failure exacerbation.   62 yo female with the past medical history of hypertension, hyperlipidemia, COPD, (home 02), and T2DM who presented with dyspnea and edema. Reported worsening symptoms for the last 4 weeks, with lower extremity edema, dyspnea, PND and orthopnea. As outpatient she has been treated with furosemide and has received antibiotic therapy for urinary tract infection, with no improvement in her symptoms. Her family noted severe symptoms the night prior to admission, and EMS was called the following morning. Her 02 saturation was noted to be 55 to 60% she was placed on supplemental 02 and was transported to the ED. On her initial physical examination her blood pressure was 107/95, RR 27, HR 90, lungs with expiratory wheezing and rales, heart with S1 and S2 present and rhythmic, no JVD, abdomen with no distention, positive lower extremities.   Na 125, K 3,3 CL 84, bicarbonate 30, glucose 109, bun 13 cr 0,87  BNP 589  High sensitive troponin 11 and 11.  ABG 7,42/ 60/ 55/ 38/ 88.4% Wbc 7,9 hgb 10.3 plt 203   Chest radiograph right rotated, positive cardiomegaly with bilateral hilar congestion and bilateral interstitial infiltrates, bilateral small pleural effusions, more right than left.   CT chest with bilateral ground glass opacities, with bilateral pleural effusions, more right than left with bilateral basal atelectasis.  No pulmonary embolism, positive pericardial effusion. Ascites.   Right solid pulmonary nodule within the upper lobe measuring 3 mm. (Optional repeat CT in 12 mo recommended).   EKG 90 bpm, normal axis, right bundle branch block, sinus rhythm with right atrial enlargement, no significant ST segment or T wave changes.   Patient was placed on Bipap for respiratory distress, received IV furosemide and systemic steroids.   03/30 patient responding well to diuresis, weaned off Bipap. Plan to  transfer to telemetry today.

## 2022-04-02 NOTE — Assessment & Plan Note (Signed)
Smoking cessation counseling 

## 2022-04-02 NOTE — Progress Notes (Signed)
  Echocardiogram 2D Echocardiogram has been performed.  Patricia Bass 04/02/2022, 8:32 AM

## 2022-04-02 NOTE — Assessment & Plan Note (Addendum)
Pericardial effusion.   Patient continue hypervolemic.  Noted urine output close to 1 L this am on the canister.  Systolic blood pressure [id number] to 106 mmHg.   Plan to continue diuresis with IV furosemide, will increase dose to 60 mg IV q12 hrs.  Add spironolactone and SGLT 2 inh.  Hold on lisinopril, will transition to ARB during this hospitalization.  Follow up on echocardiogram.   Acute on chronic hypoxemic and hypercapnic respiratory failure.  Acute cardiogenic pulmonary edema, bilateral pleural effusions.  Plan to continue diuresis with furosemide. Trial of liberation from non invasive mechanical ventilation to nasal cannula, keep 02 saturation  88% or greater.

## 2022-04-02 NOTE — Assessment & Plan Note (Signed)
Fasting glucose this am 114.  Continue insulin sliding scale for glucose cover and monitoring.

## 2022-04-02 NOTE — TOC Progression Note (Signed)
  Transition of Care [facility]) Screening Note   Patient Details  Name: Patricia Bass Date of Birth: 1960/05/02   [facility]) CM/SW Contact:    Boneta Lucks, RN Phone Number: 04/02/2022, 10:36 AM    Transition of Care Department [facility]) has reviewed patient and no TOC needs have been identified at this time. We will continue to monitor patient advancement through interdisciplinary progression rounds. If new patient transition needs arise, please place a TOC consult.     Expected Discharge Plan: Home/Self Care Barriers to Discharge: Continued Medical Work up  Expected Discharge Plan and Services       Living arrangements for the past 2 months: Single Family Home                    Social Determinants of Health (SDOH) Interventions SDOH Screenings   Food Insecurity: No Food Insecurity (04/01/2022)  Housing: Low Risk  (04/01/2022)  Transportation Needs: No Transportation Needs (04/01/2022)  Utilities: Not At Risk (04/01/2022)  Tobacco Use: High Risk (04/01/2022)

## 2022-04-02 NOTE — Assessment & Plan Note (Signed)
Continue aggressive diuresis, will hold on lisinopril for now.  Will plan for ARB before discharge.

## 2022-04-02 NOTE — Assessment & Plan Note (Addendum)
Fasting glucose this am 86.  Capillary glucose 90 and 82  Plan to discontinue insulin therapy and check capillary glucose only as needed.   Continue with statin.

## 2022-04-02 NOTE — Assessment & Plan Note (Signed)
Continue with pantoprazole/.  

## 2022-04-03 DIAGNOSIS — I5033 Acute on chronic diastolic (congestive) heart failure: Secondary | ICD-10-CM | POA: Diagnosis not present

## 2022-04-03 DIAGNOSIS — I10 Essential (primary) hypertension: Secondary | ICD-10-CM | POA: Diagnosis not present

## 2022-04-03 DIAGNOSIS — J449 Chronic obstructive pulmonary disease, unspecified: Secondary | ICD-10-CM | POA: Diagnosis not present

## 2022-04-03 DIAGNOSIS — E876 Hypokalemia: Secondary | ICD-10-CM | POA: Diagnosis not present

## 2022-04-03 LAB — BASIC METABOLIC PANEL
Anion gap: 9 (ref 5–15)
BUN: 21 mg/dL (ref 8–23)
CO2: 39 mmol/L — ABNORMAL HIGH (ref 22–32)
Calcium: 8.8 mg/dL — ABNORMAL LOW (ref 8.9–10.3)
Chloride: 89 mmol/L — ABNORMAL LOW (ref 98–111)
Creatinine, Ser: 0.85 mg/dL (ref 0.44–1.00)
GFR, Estimated: >60 mL/min (ref >60)
Glucose, Bld: 86 mg/dL (ref 70–99)
Potassium: 3 mmol/L — ABNORMAL LOW (ref 3.5–5.1)
Sodium: 137 mmol/L (ref 135–145)

## 2022-04-03 LAB — GLUCOSE, CAPILLARY
Glucose-Capillary: 90 mg/dL (ref 70–99)
Glucose-Capillary: 82 mg/dL (ref 70–99)
Glucose-Capillary: 104 mg/dL — ABNORMAL HIGH (ref 70–99)

## 2022-04-03 LAB — HEMOGLOBIN A1C
Hgb A1c MFr Bld: 5.3 % (ref 4.8–5.6)
Mean Plasma Glucose: 105 mg/dL

## 2022-04-03 LAB — MAGNESIUM: Magnesium: 2.1 mg/dL (ref 1.7–2.4)

## 2022-04-03 MED ORDER — POTASSIUM CHLORIDE CRYS ER 20 MEQ PO TBCR
40.0000 meq | EXTENDED_RELEASE_TABLET | ORAL | Status: AC
  Administered 2022-04-03 (×2): 40 meq via ORAL
  Filled 2022-04-03 (×2): qty 2

## 2022-04-03 NOTE — Progress Notes (Signed)
Progress Note   Patient: Patricia Bass [id number] DOB: [birth year] DOA: 04/01/2022     2 DOS: the patient was seen and examined on 04/03/2022   Brief hospital course: Mrs. Charlier was admitted to the hospital with the working diagnosis of heart failure exacerbation.   62 yo female with the past medical history of hypertension, hyperlipidemia, COPD, (home 02), and T2DM who presented with dyspnea and edema. Reported worsening symptoms for the last 4 weeks, with lower extremity edema, dyspnea, PND and orthopnea. As outpatient she has been treated with furosemide and has received antibiotic therapy for urinary tract infection, with no improvement in her symptoms. Her family noted severe symptoms the night prior to admission, and EMS was called the following morning. Her 02 saturation was noted to be 55 to 60% she was placed on supplemental 02 and was transported to the ED. On her initial physical examination her blood pressure was 107/95, RR 27, HR 90, lungs with expiratory wheezing and rales, heart with S1 and S2 present and rhythmic, no JVD, abdomen with no distention, positive lower extremities.   Na 125, K 3,3 CL 84, bicarbonate 30, glucose 109, bun 13 cr 0,87  BNP 589  High sensitive troponin 11 and 11.  ABG 7,42/ 60/ 55/ 38/ 88.4% Wbc 7,9 hgb 10.3 plt 203   Chest radiograph right rotated, positive cardiomegaly with bilateral hilar congestion and bilateral interstitial infiltrates, bilateral small pleural effusions, more right than left.   CT chest with bilateral ground glass opacities, with bilateral pleural effusions, more right than left with bilateral basal atelectasis.  No pulmonary embolism, positive pericardial effusion. Ascites.   Right solid pulmonary nodule within the upper lobe measuring 3 mm. (Optional repeat CT in 12 mo recommended).   EKG 90 bpm, normal axis, right bundle branch block, sinus rhythm with right atrial enlargement, no significant ST segment or T wave changes.    Patient was placed on Bipap for respiratory distress, received IV furosemide and systemic steroids.   03/30 patient responding well to diuresis, weaned off Bipap. Plan to transfer to telemetry today.   Assessment and Plan: * Acute on chronic diastolic CHF (congestive heart failure) (HCC) Echocardiogram with preserved LV systolic function EF 55 to 60%, mild asymmetric left ventricular hypertrophy. Septal hypokinesis, RV systolic function with severe reduction, RV size with mild to moderate enlargement, RA with moderate dilatation, RVSP 41.6 mmHg. Small to moderate pericardial effusion, surrounding the heart, mainly posterior to LV.  No significant valvular disease.    Urine output is [id number] Systolic blood pressure [id number] to 133 mmHg.   Plan to continue diuresis with furosemide IV 60 mg q12 hrs Spironolactone and empagliflozin.  Add losartan as blood pressure allows during this hospitalization.  Hold on B blockade for now.   Acute on chronic hypoxemic and hypercapnic respiratory failure.  Acute cardiogenic pulmonary edema, bilateral pleural effusions.  Improved oxygenation, today 02 saturation is 96% on 4 L/ per HFNC.  Will discontinue Bipap and transfer patient to telemetry ward. Continue diuresis and oxymetry monitoring.   Essential hypertension Blood pressure has improved, continue diuresis. Will benefit from ARB initiation after patient more euvolemic.   Hypokalemia Hyponatremia.    Patient is responding well to diuresis, renal function today with serum cr at 0,85, K is 3,0 and serum bicarbonate at 39. Na 137 Mg 2,1   Plan to add Kcl 40 meq x 2 doses Continue diuresis with furosemide, spironolactone and SGLT 2 inh.  Follow up renal function and electrolytes in  am.   COPD (chronic obstructive pulmonary disease) ([place]) Patient on supplemental home 02. No clinical signs of exacerbation.   Continue with bronchodilator therapy.   Type 2 diabetes mellitus (HCC) Fasting  glucose this am 114.  Continue insulin sliding scale for glucose cover and monitoring.   Acquired hypothyroidism Continue with levothyroxine.   GERD (gastroesophageal reflux disease) Continue with pantoprazole.   Type 2 diabetes mellitus with hyperlipidemia (HCC) Fasting glucose this am 86.  Capillary glucose 90 and 82  Plan to discontinue insulin therapy and check capillary glucose only as needed.   Continue with statin.   Tobacco abuse Smoking cessation counseling.   Class 1 obesity Calculated BMI is 30,8        Subjective: Patient is feeling much better, dyspnea has improved, no chest pain or edema.    Physical Exam: Vitals:   04/03/22 0500 04/03/22 0528 04/03/22 0600 04/03/22 0727  BP: (!) 111/58  (!) 92/54   Pulse: 79  69   Resp: 19  10   Temp:  (!) 97.1 F (36.2 C)  (!) 97.3 F (36.3 C)  TempSrc:  Axillary  Axillary  SpO2: 96%  96%   Weight:  68.8 kg    Height:       Neurology awake and alert ENT with mild pallor Cardiovascular with S1 and S2 present and rhythmic with no gallops, rubs or murmurs No JVD No lower extremity edema  Respiratory with scattered rales with no rhonchi or wheezing,  Abdomen with no distention  Data Reviewed:    Family Communication: no family at the bedside   Disposition: Status is: Inpatient Remains inpatient appropriate because: heart failure   Planned Discharge Destination: Home      Author: Tawni Millers, MD 04/03/2022 8:55 AM  For on call review [email or web address].
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
## 2022-04-04 DIAGNOSIS — I5033 Acute on chronic diastolic (congestive) heart failure: Secondary | ICD-10-CM | POA: Diagnosis not present

## 2022-04-04 DIAGNOSIS — J9601 Acute respiratory failure with hypoxia: Secondary | ICD-10-CM | POA: Diagnosis not present

## 2022-04-04 DIAGNOSIS — E876 Hypokalemia: Secondary | ICD-10-CM | POA: Diagnosis not present

## 2022-04-04 LAB — BASIC METABOLIC PANEL
Anion gap: 13 (ref 5–15)
BUN: 17 mg/dL (ref 8–23)
CO2: 36 mmol/L — ABNORMAL HIGH (ref 22–32)
Calcium: 9.3 mg/dL (ref 8.9–10.3)
Chloride: 88 mmol/L — ABNORMAL LOW (ref 98–111)
Creatinine, Ser: 0.75 mg/dL (ref 0.44–1.00)
GFR, Estimated: >60 mL/min (ref >60)
Glucose, Bld: 102 mg/dL — ABNORMAL HIGH (ref 70–99)
Potassium: 3.4 mmol/L — ABNORMAL LOW (ref 3.5–5.1)
Sodium: 137 mmol/L (ref 135–145)

## 2022-04-04 LAB — MAGNESIUM: Magnesium: 2 mg/dL (ref 1.7–2.4)

## 2022-04-04 MED ORDER — LORATADINE 10 MG PO TABS: 10.0000 mg | ORAL_TABLET | Freq: Every day | ORAL | Status: DC

## 2022-04-04 MED ORDER — FUROSEMIDE 40 MG PO TABS
40.0000 mg | ORAL_TABLET | Freq: Two times a day (BID) | ORAL | Status: DC
  Administered 2022-04-04 – 2022-04-05 (×3): 40 mg via ORAL
  Filled 2022-04-04 (×3): qty 1

## 2022-04-04 MED ORDER — CILOSTAZOL 100 MG PO TABS
50.0000 mg | ORAL_TABLET | Freq: Two times a day (BID) | ORAL | Status: DC
  Administered 2022-04-04 – 2022-04-05 (×3): 50 mg via ORAL
  Filled 2022-04-04 (×3): qty 1

## 2022-04-04 MED ORDER — POTASSIUM CHLORIDE CRYS ER 20 MEQ PO TBCR
40.0000 meq | EXTENDED_RELEASE_TABLET | Freq: Two times a day (BID) | ORAL | Status: AC
  Administered 2022-04-04 (×2): 40 meq via ORAL
  Filled 2022-04-04 (×2): qty 2

## 2022-04-04 NOTE — Progress Notes (Signed)
Rt checked on patient to place on BIPAP machine for tonight patient not ready RT will check back
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
## 2022-04-05 DIAGNOSIS — I5033 Acute on chronic diastolic (congestive) heart failure: Secondary | ICD-10-CM | POA: Diagnosis not present

## 2022-04-05 LAB — CBC
HCT: 46.5 % — ABNORMAL HIGH (ref 36.0–46.0)
Hemoglobin: 13.3 g/dL (ref 12.0–15.0)
MCH: 23.8 pg — ABNORMAL LOW (ref 26.0–34.0)
MCHC: 28.6 g/dL — ABNORMAL LOW (ref 30.0–36.0)
MCV: 83 fL (ref 80.0–100.0)
Platelets: 221 10*3/uL (ref 150–400)
RBC: 5.6 MIL/uL — ABNORMAL HIGH (ref 3.87–5.11)
RDW: 23.9 % — ABNORMAL HIGH (ref 11.5–15.5)
WBC: 9 10*3/uL (ref 4.0–10.5)
nRBC: 0 % (ref 0.0–0.2)

## 2022-04-05 LAB — BASIC METABOLIC PANEL
Anion gap: 15 (ref 5–15)
BUN: 21 mg/dL (ref 8–23)
CO2: 31 mmol/L (ref 22–32)
Calcium: 9.4 mg/dL (ref 8.9–10.3)
Chloride: 88 mmol/L — ABNORMAL LOW (ref 98–111)
Creatinine, Ser: 0.82 mg/dL (ref 0.44–1.00)
GFR, Estimated: >60 mL/min (ref >60)
Glucose, Bld: 114 mg/dL — ABNORMAL HIGH (ref 70–99)
Potassium: 3.5 mmol/L (ref 3.5–5.1)
Sodium: 134 mmol/L — ABNORMAL LOW (ref 135–145)

## 2022-04-05 LAB — RETICULOCYTES
Immature Retic Fract: 26.6 % — ABNORMAL HIGH (ref 2.3–15.9)
RBC.: 5.55 MIL/uL — ABNORMAL HIGH (ref 3.87–5.11)
Retic Count, Absolute: 170.4 10*3/uL (ref 19.0–186.0)
Retic Ct Pct: 3.1 % (ref 0.4–3.1)

## 2022-04-05 LAB — IRON AND TIBC
Iron: 55 ug/dL (ref 28–170)
Saturation Ratios: 11 % (ref 10.4–31.8)
TIBC: 516 ug/dL — ABNORMAL HIGH (ref 250–450)
UIBC: 461 ug/dL

## 2022-04-05 LAB — FOLATE: Folate: 6.2 ng/mL (ref >5.9)

## 2022-04-05 LAB — FERRITIN: Ferritin: 36 ng/mL (ref 11–307)

## 2022-04-05 LAB — VITAMIN B12: Vitamin B-12: 248 pg/mL (ref 180–914)

## 2022-04-05 MED ORDER — ASPIRIN 81 MG PO TBEC: 81.0000 mg | DELAYED_RELEASE_TABLET | Freq: Every day | ORAL | 2 refills | Status: AC

## 2022-04-05 MED ORDER — METOPROLOL TARTRATE 25 MG PO TABS: 12.5000 mg | ORAL_TABLET | Freq: Two times a day (BID) | ORAL | 1 refills | Status: DC

## 2022-04-05 MED ORDER — IPRATROPIUM-ALBUTEROL 0.5-2.5 (3) MG/3ML IN SOLN: 3.0000 mL | Freq: Four times a day (QID) | RESPIRATORY_TRACT | 1 refills | Status: DC | PRN

## 2022-04-05 MED ORDER — CYANOCOBALAMIN 1000 MCG PO TABS: 1000.0000 ug | ORAL_TABLET | Freq: Every day | ORAL | 2 refills | Status: DC

## 2022-04-05 MED ORDER — POTASSIUM CHLORIDE CRYS ER 20 MEQ PO TBCR
40.0000 meq | EXTENDED_RELEASE_TABLET | ORAL | Status: AC
  Administered 2022-04-05 (×2): 40 meq via ORAL
  Filled 2022-04-05 (×2): qty 2

## 2022-04-05 MED ORDER — VITAMIN B-12 1000 MCG PO TABS
1000.0000 ug | ORAL_TABLET | Freq: Every day | ORAL | Status: DC
  Administered 2022-04-05: 1000 ug via ORAL
  Filled 2022-04-05: qty 1

## 2022-04-05 MED ORDER — FUROSEMIDE 40 MG PO TABS: 40.0000 mg | ORAL_TABLET | Freq: Two times a day (BID) | ORAL | 2 refills | Status: DC

## 2022-04-05 MED ORDER — POTASSIUM CHLORIDE CRYS ER 20 MEQ PO TBCR: 20.0000 meq | EXTENDED_RELEASE_TABLET | Freq: Every day | ORAL | 1 refills | Status: DC

## 2022-04-05 MED ORDER — METOPROLOL TARTRATE 25 MG PO TABS
12.5000 mg | ORAL_TABLET | Freq: Two times a day (BID) | ORAL | Status: DC
  Filled 2022-04-05: qty 1

## 2022-04-05 MED ORDER — SPIRONOLACTONE 25 MG PO TABS: 12.5000 mg | ORAL_TABLET | Freq: Every day | ORAL | 2 refills | Status: DC

## 2022-04-05 NOTE — Care Management Important Message (Signed)
Important Message  Patient Details  Name: Patricia Bass MRN: [id number] Date of Birth: 1960/10/23   Medicare Important Message Given:  Yes     Tommy Medal 04/05/2022, 11:50 AM

## 2022-04-05 NOTE — Discharge Summary (Signed)
[facility]  Physician Discharge Summary   Patient ID: Patricia Bass MRN: [id number] DOB/AGE: 11-Sep-1960 62 y.o.  Admit date: 04/01/2022 Discharge date:   04/05/2022   PCP: No primary care provider on file.  DISCHARGE DIAGNOSES:    Acute on chronic diastolic CHF (congestive heart failure)   Essential hypertension   Hypokalemia   COPD (chronic obstructive pulmonary disease)   Acquired hypothyroidism   GERD (gastroesophageal reflux disease)   Type 2 diabetes mellitus with hyperlipidemia   Tobacco abuse   RECOMMENDATIONS FOR OUTPATIENT FOLLOW UP: Ambulatory referral sent to [facility] cardiology at [facility] for outpatient follow-up Ambulatory referral sent to pulmonology for outpatient follow-up    Home Health: None Equipment/Devices: Home oxygen  CODE STATUS: Full code  DISCHARGE CONDITION: fair  Diet recommendation: Heart healthy  INITIAL HISTORY: 62 yo female with the past medical history of hypertension, hyperlipidemia, COPD, (home 02), and T2DM who presented with dyspnea and edema.  Diagnosed as having congestive heart failure.  Hospitalized for further management.  Started on diuretics.  Initially required BiPAP.     Consultants: None   Procedures: Echocardiogram   HOSPITAL COURSE:   Acute on chronic diastolic CHF (congestive heart failure) Echocardiogram with preserved LV systolic function EF 55 to 60%, mild asymmetric left ventricular hypertrophy. Septal hypokinesis, RV systolic function with severe reduction, RV size with mild to moderate enlargement, RA with moderate dilatation, RVSP 41.6 mmHg. Small to moderate pericardial effusion, surrounding the heart, mainly posterior to LV. No significant valvular disease.   Patient was started on IV furosemide.  She has diuresed well.  She is lost a lot of weight. Oxygenation appears to have improved. Transitioned to oral furosemide.  Patient is already on spironolactone. Referral sent to cardiology for  outpatient follow-up. Stable for discharge today.   Acute on chronic hypoxemic and hypercapnic respiratory failure.  Secondary to acute cardiogenic pulmonary edema, bilateral pleural effusions.  Was requiring BiPAP initially.  Now on oxygen by nasal cannula.   Will need home oxygen which is being arranged by case management.   Essential hypertension Improvement in blood pressure noted.  Occasional low readings noted.  Hold off on ACE inhibitor/ARB.   BP shows bigeminy.  Heart rate in the 90s to 100.  Will start low-dose beta-blocker.   Hypokalemia/hyponatremia Electrolytes were repleted.   COPD (chronic obstructive pulmonary disease) (HCC) No clinical signs of exacerbation.  Continue with bronchodilator therapy.    Type 2 diabetes mellitus (HCC) Continue SSI.  CBGs are reasonably well-controlled.   Normocytic anemia No evidence for overt bleeding.  Anemia panel suggested borderline low B12 levels.  Will start B12 supplementation.   Acquired hypothyroidism Continue with levothyroxine.    GERD (gastroesophageal reflux disease) Continue with pantoprazole.    Tobacco abuse Smoking cessation counseling.   Patient is stable.  Okay for discharge home once home oxygen has been arranged.   PERTINENT LABS:  The results of significant diagnostics from this hospitalization (including imaging, microbiology, ancillary and laboratory) are listed below for reference.    Microbiology: Recent Results (from the past 240 hour(s))  MRSA Next Gen by PCR, Nasal     Status: None   Collection Time: 04/01/22  6:01 PM   Specimen: Nasal Mucosa; Nasal Swab  Result Value Ref Range Status   MRSA by PCR Next Gen NOT DETECTED NOT DETECTED Final    Comment: (NOTE) The GeneXpert MRSA Assay (FDA approved for NASAL specimens only), is one component of a comprehensive MRSA colonization surveillance program. It is not  intended to diagnose MRSA infection nor to guide or monitor treatment for MRSA  infections. Test performance is not FDA approved in patients less than 63 years old. Performed at [facility], [address] [place], [place] [address]      Labs:   Basic Metabolic Panel: Recent Labs  Lab 04/01/22 1228 04/02/22 0435 04/03/22 0450 04/04/22 0510 04/05/22 0342  NA 125* 134* 137 137 134*  K 3.3* 3.8 3.0* 3.4* 3.5  CL 84* 91* 89* 88* 88*  CO2 30 33* 39* 36* 31  GLUCOSE 109* 114* 86 102* 114*  BUN 13 12 21 17 21   CREATININE 0.87 0.77 0.85 0.75 0.82  CALCIUM 8.1* 8.8* 8.8* 9.3 9.4  MG  --  2.2 2.1 2.0  --   PHOS  --  4.5  --   --   --    Liver Function Tests: Recent Labs  Lab 04/01/22 1228 04/02/22 0435  AST 23 19  ALT 17 16  ALKPHOS 92 87  BILITOT 0.7 0.6  PROT 7.0 6.7  ALBUMIN 3.2* 3.0*    CBC: Recent Labs  Lab 04/01/22 1228 04/02/22 0435 04/05/22 0342  WBC 7.9 7.2 9.0  NEUTROABS 5.7  --   --   HGB 10.3* 10.5* 13.3  HCT 35.6* 37.5 46.5*  MCV 82.0 84.3 83.0  PLT 203 182 221    BNP: BNP (last 3 results) Recent Labs    04/01/22 1228  BNP 589.0*     CBG: Recent Labs  Lab 04/02/22 1941 04/02/22 2317 04/03/22 0503 04/03/22 0726 04/03/22 1135  GLUCAP 123* 118* 90 82 104*     IMAGING STUDIES ECHOCARDIOGRAM COMPLETE  Result Date: 04/02/2022    ECHOCARDIOGRAM REPORT   Patient Name:   Patricia Bass Date of Exam: 04/02/2022 Medical Rec #:  [id number]          Height:       61.0 in Accession #:    [id number]         Weight:       158.0 lb Date of Birth:  [birth year]          BSA:          1.709 m Patient Age:    62 years           BP:           107/62 mmHg Patient Gender: F                  HR:           76 bpm. Exam Location:  [facility] Procedure: 2D Echo, Cardiac Doppler and Color Doppler Indications:    acute systolic chf  History:        Patient has no prior history of Echocardiogram examinations.                 COPD, Signs/Symptoms:Dyspnea and Edema; Risk                 Factors:Hypertension, Diabetes, Sleep Apnea and Current  Smoker.  Sonographer:    Johny Chess RDCS Referring Phys: [id number] OLADAPO ADEFESO IMPRESSIONS  1. Septal hypokinesis.. Left ventricular ejection fraction, by estimation, is 55 to 60%. The left ventricle has normal function. There is mild asymmetric left ventricular hypertrophy.  2. Right ventricular systolic function is severely reduced. The right ventricular size is mild to moderately. There is mildly elevated pulmonary artery systolic pressure.  3. Left atrial size was mildly dilated.  4. Right atrial  size was moderately dilated.  5. Small to moderate pericardial effusion surrounds heart, mainly posterior to LV. a small pericardial effusion is present.  6. The mitral valve is normal in structure. Mild mitral valve regurgitation.  7. The aortic valve is tricuspid. Aortic valve regurgitation is not visualized.  8. The inferior vena cava is dilated in size with <50% respiratory variability, suggesting right atrial pressure of 15 mmHg. FINDINGS  Left Ventricle: Septal hypokinesis. Left ventricular ejection fraction, by estimation, is 55 to 60%. The left ventricle has normal function. The left ventricular internal cavity size was normal in size. There is mild asymmetric left ventricular hypertrophy. Right Ventricle: The right ventricular size is mild to moderately. Right vetricular wall thickness was not assessed. Right ventricular systolic function is severely reduced. There is mildly elevated pulmonary artery systolic pressure. The tricuspid regurgitant velocity is 2.58 m/s, and with an assumed right atrial pressure of 15 mmHg, the estimated right ventricular systolic pressure is [id number] mmHg. Left Atrium: Left atrial size was mildly dilated. Right Atrium: Right atrial size was moderately dilated. Pericardium: Small to moderate pericardial effusion surrounds heart, mainly posterior to LV. A small pericardial effusion is present. Mitral Valve: The mitral valve is normal in structure. Mild mitral valve  regurgitation. Tricuspid Valve: The tricuspid valve is normal in structure. Tricuspid valve regurgitation is mild. Aortic Valve: The aortic valve is tricuspid. Aortic valve regurgitation is not visualized. Pulmonic Valve: The pulmonic valve was grossly normal. Pulmonic valve regurgitation is not visualized. Aorta: The aortic root and ascending aorta are structurally normal, with no evidence of dilitation. Venous: The inferior vena cava is dilated in size with less than 50% respiratory variability, suggesting right atrial pressure of 15 mmHg. IAS/Shunts: No atrial level shunt detected by color flow Doppler.  LEFT VENTRICLE PLAX 2D LVIDd:         3.80 cm     Diastology LVIDs:         2.20 cm     LV e' medial:    7.62 cm/s LV PW:         1.20 cm     LV E/e' medial:  15.0 LV IVS:        1.00 cm     LV e' lateral:   7.07 cm/s LVOT diam:     1.80 cm     LV E/e' lateral: 16.1 LV SV:         49 LV SV Index:   29 LVOT Area:     2.54 cm  LV Volumes (MOD) LV vol d, MOD A2C: 49.0 ml LV vol s, MOD A2C: 22.0 ml LV SV MOD A2C:     27.0 ml RIGHT VENTRICLE             IVC RV Basal diam:  3.20 cm     IVC diam: 2.80 cm RV S prime:     11.90 cm/s TAPSE (M-mode): 1.3 cm LEFT ATRIUM             Index        RIGHT ATRIUM           Index LA diam:        3.70 cm 2.17 cm/m   RA Area:     21.40 cm LA Vol (A2C):   52.8 ml 30.90 ml/m  RA Volume:   63.30 ml  37.05 ml/m LA Vol (A4C):   62.4 ml 36.52 ml/m LA Biplane Vol: 59.7 ml 34.94 ml/m  AORTIC VALVE LVOT Vmax:  104.00 cm/s LVOT Vmean:  68.600 cm/s LVOT VTI:    0.192 m  AORTA Ao Root diam: 2.90 cm MITRAL VALVE                TRICUSPID VALVE MV Area (PHT): 3.27 cm     TR Peak grad:   26.6 mmHg MV Decel Time: 232 msec     TR Vmax:        258.00 cm/s MV E velocity: 114.00 cm/s MV A velocity: 125.00 cm/s  SHUNTS MV E/A ratio:  0.91         Systemic VTI:  0.19 m                             Systemic Diam: 1.80 cm Dorris Carnes MD Electronically signed by Dorris Carnes MD Signature Date/Time:  04/02/2022/4:28:02 PM    Final    CT Angio Chest PE W and/or Wo Contrast  Result Date: 04/01/2022 CLINICAL DATA:  Evaluate for pulmonary embolism. Complains of swelling in legs and face. EXAM: CT ANGIOGRAPHY CHEST WITH CONTRAST TECHNIQUE: Multidetector CT imaging of the chest was performed using the standard protocol during bolus administration of intravenous contrast. Multiplanar CT image reconstructions and MIPs were obtained to evaluate the vascular anatomy. RADIATION DOSE REDUCTION: This exam was performed according to the departmental dose-optimization program which includes automated exposure control, adjustment of the mA and/or kV according to patient size and/or use of iterative reconstruction technique. CONTRAST:  64mL OMNIPAQUE IOHEXOL 350 MG/ML SOLN COMPARISON:  None Available. FINDINGS: Cardiovascular: Satisfactory opacification of the pulmonary arteries to the segmental level. No evidence of pulmonary embolism. Cardiac enlargement. Pericardial effusion identified. Aortic atherosclerosis and coronary artery calcification. Mediastinum/Nodes: Thyroid gland, trachea, and esophagus are unremarkable. No enlarged axillary lymph nodes. Right paratracheal node measures 1.8 cm, image 31/4. Left paratracheal lymph node measures 1.6 cm, image 31/4. Right hilar node measures 1.1 cm, image 54/4. Lungs/Pleura: Centrilobular and paraseptal emphysema. Tiny nodule within the periphery of the right upper lobe measures 3 mm, image 67/6. Small partially loculated right pleural effusion overlies the posterior and medial right lower lobe. Interlobular septal thickening is identified within both lung bases. Mild interlobular septal thickening is noted with a lower lung zone predominance. Posterior airspace consolidation is noted within both lower lobes. Upper Abdomen: Perihepatic ascites and perisplenic ascites identified. Status post cholecystectomy. Aortic atherosclerotic calcifications. Musculoskeletal: No acute or  suspicious osseous findings. Status post ACDF. Lower thoracic spine degenerative disc disease. Review of the MIP images confirms the above findings. IMPRESSION: 1. No evidence for acute pulmonary embolus. 2. Cardiac enlargement with pericardial effusion. 3. Small partially loculated right pleural effusion overlies the posterior and medial right lower lobe. 4. Mild interlobular septal thickening with a lower lung zone predominance. Findings are suggestive of mild interstitial edema. 5. Posterior consolidative change within both lower lobes is favored to represent atelectasis. Pneumonia not excluded 6. Enlarged mediastinal and right hilar lymph nodes are nonspecific and may be reactive in the setting of CHF and/or pneumonia. 7. Ascites. 8. Right solid pulmonary nodule within the upper lobe measuring 3 mm. Per Fleischner Society Guidelines, a non-contrast Chest CT at 12 months is optional. If performed and the nodule is stable at 12 months, no further follow-up is recommended. These guidelines do not apply to immunocompromised patients and patients with cancer. Follow up in patients with significant comorbidities as clinically warranted. For lung cancer screening, adhere to Lung-RADS guidelines. Reference: Radiology. 2017; 284(1):228-43. No  9. Aortic Atherosclerosis (ICD10-I70.0) and Emphysema (ICD10-J43.9). Electronically Signed   By: Kerby Moors M.D.   On: 04/01/2022 14:56   DG Chest Portable 1 View  Result Date: 04/01/2022 CLINICAL DATA:  dyspnea EXAM: PORTABLE CHEST 1 VIEW COMPARISON:  Radiograph 02/02/2018 FINDINGS: Markedly enlarged cardiac silhouette, appears increased in size in comparison to prior radiograph. There are mild interstitial opacities bilaterally with bibasilar airspace disease and small pleural effusions, right greater than left. No evidence of pneumothorax. No acute osseous abnormality. IMPRESSION: Markedly enlarged cardiac silhouette consistent with cardiomegaly and/or pericardial  effusion, correlate with ECHO. Mild pulmonary edema with small pleural effusions, right greater than left. Electronically Signed   By: Maurine Simmering M.D.   On: 04/01/2022 12:33    DISCHARGE EXAMINATION: Vitals:   04/05/22 0018 04/05/22 0402 04/05/22 0500 04/05/22 1020  BP:  99/73  111/71  Pulse:  61  (!) 54  Resp: 17 18    Temp:  98 F (36.7 C)    TempSrc:      SpO2:      Weight:   59.3 kg   Height:       General appearance: Awake alert.  In no distress Resp: Clear to auscultation bilaterally.  Normal effort Cardio: S1-S2 is normal regular.  No S3-S4.  No rubs murmurs or bruit GI: Abdomen is soft.  Nontender nondistended.  Bowel sounds are present normal.  No masses organomegaly Extremities: No edema.  Full range of motion of lower extremities. Neurologic: Alert and oriented x3.  No focal neurological deficits.    DISPOSITION: Home  Discharge Instructions     (HEART FAILURE PATIENTS) Call MD:  Anytime you have any of the following symptoms: 1) 3 pound weight gain in 24 hours or 5 pounds in 1 week 2) shortness of breath, with or without a dry hacking cough 3) swelling in the hands, feet or stomach 4) if you have to sleep on extra pillows at night in order to breathe.   Complete by: As directed    Ambulatory Referral for Lung Cancer Scre   Complete by: As directed    Ambulatory referral to Cardiology   Complete by: As directed    Hospitalized at [facility] for diastolic CHF.  Needs outpatient consultation.   Call MD for:  difficulty breathing, headache or visual disturbances   Complete by: As directed    Call MD for:  extreme fatigue   Complete by: As directed    Call MD for:  persistant dizziness or light-headedness   Complete by: As directed    Call MD for:  persistant nausea and vomiting   Complete by: As directed    Call MD for:  severe uncontrolled pain   Complete by: As directed    Call MD for:  temperature >100.4   Complete by: As directed    Diet - low sodium heart  healthy   Complete by: As directed    Discharge instructions   Complete by: As directed    Please take your medications as prescribed.  Please be sure to follow-up with your primary care provider within 1 week.  Referral will be sent to the lung doctor as well as cardiology to arrange outpatient consultations.   You were cared for by a hospitalist during your hospital stay. If you have any questions about your discharge medications or the care you received while you were in the hospital after you are discharged, you can call the unit and asked to speak with the hospitalist  on call if the hospitalist that took care of you is not available. Once you are discharged, your primary care physician will handle any further medical issues. Please note that NO REFILLS for any discharge medications will be authorized once you are discharged, as it is imperative that you return to your primary care physician (or establish a relationship with a primary care physician if you do not have one) for your aftercare needs so that they can reassess your need for medications and monitor your lab values. If you do not have a primary care physician, you can call [phone] for a physician referral.   Increase activity slowly   Complete by: As directed           Allergies as of 04/05/2022       Reactions   Duragesic Disc Transdermal System [fentanyl] Nausea And Vomiting        Medication List     TAKE these medications    albuterol 108 (90 Base) MCG/ACT inhaler Commonly known as: VENTOLIN HFA Inhale 2 puffs into the lungs every 6 (six) hours as needed for wheezing or shortness of breath.   aspirin EC 81 MG tablet Take 1 tablet (81 mg total) by mouth daily. Swallow whole.   atorvastatin 40 MG tablet Commonly known as: LIPITOR Take 40 mg by mouth every evening.   cyanocobalamin 1000 MCG tablet Take 1 tablet (1,000 mcg total) by mouth daily.   furosemide 40 MG tablet Commonly known as: LASIX Take 1  tablet (40 mg total) by mouth 2 (two) times daily. What changed:  medication strength how much to take when to take this   ibuprofen 800 MG tablet Commonly known as: ADVIL Take 800 mg by mouth 2 (two) times daily as needed for headache or moderate pain.   ipratropium-albuterol 0.5-2.5 (3) MG/3ML Soln Commonly known as: DUONEB Take 3 mLs by nebulization every 6 (six) hours as needed.   IRON PO Take 1 tablet by mouth daily.   levothyroxine 88 MCG tablet Commonly known as: SYNTHROID Take 88 mcg by mouth daily. What changed: Another medication with the same name was removed. Continue taking this medication, and follow the directions you see here.   metFORMIN 1000 MG tablet Commonly known as: GLUCOPHAGE Take 1,000 mg by mouth in the morning and at bedtime. What changed: Another medication with the same name was removed. Continue taking this medication, and follow the directions you see here.   methocarbamol 750 MG tablet Commonly known as: ROBAXIN Take 750 mg by mouth every 8 (eight) hours as needed for muscle spasms.   metoprolol tartrate 25 MG tablet Commonly known as: LOPRESSOR Take 0.5 tablets (12.5 mg total) by mouth 2 (two) times daily.   omeprazole 40 MG capsule Commonly known as: PRILOSEC Take 1 capsule (40 mg total) by mouth daily. What changed:  when to take this reasons to take this   oxyCODONE-acetaminophen 10-325 MG tablet Commonly known as: PERCOCET Take 1 tablet by mouth every 6 (six) hours as needed for pain.   potassium chloride SA 20 MEQ tablet Commonly known as: KLOR-CON M Take 1 tablet (20 mEq total) by mouth daily.   pregabalin 300 MG capsule Commonly known as: LYRICA Take 1 capsule (300 mg total) by mouth 2 (two) times daily.   rOPINIRole 3 MG tablet Commonly known as: REQUIP Take 3 mg by mouth 3 (three) times daily.   spironolactone 25 MG tablet Commonly known as: ALDACTONE Take 0.5 tablets (12.5 mg total) by mouth daily.  Start taking on:  April 06, 2022   valACYclovir 500 MG tablet Commonly known as: VALTREX Take 500 mg by mouth 2 (two) times daily as needed (cold sores).   VITAMIN D-3 PO Take 1 capsule by mouth daily.               Durable Medical Equipment  (From admission, onward)           Start     Ordered   04/05/22 0911  For home use only DME Nebulizer machine  Once       Question Answer Comment  Patient needs a nebulizer to treat with the following condition COPD (chronic obstructive pulmonary disease)   Length of Need Lifetime      04/05/22 0910   04/05/22 0903  For home use only DME oxygen  Once       Question Answer Comment  Length of Need Lifetime   Mode or (Route) Nasal cannula   Liters per Minute 4   Frequency Continuous (stationary and portable oxygen unit needed)   Oxygen conserving device Yes   Oxygen delivery system Gas      04/05/22 0902               TOTAL DISCHARGE TIME: 35 mins  Eber Ferrufino [employer] on [email or web address]  04/05/2022, 11:38 AM

## 2022-04-05 NOTE — Progress Notes (Signed)
EVS staff cleaning this room (318) brought patient belongings to the desk that included bag of clothing and a black pocketbook. All items placed in belongings bag, labeled with pt's name and left at nurse's station under desk. Called number listed for pt but no answer and unable to leave voice mail. Alternate contact number called (pt's mother) and she was advised of belonging left here. She states she will notify her daughter and have someone come to pick up items tomorrow. Night charge nurse Diona Fanti, RN & secretary Bertha Stakes notified of belongings and plan for pick-up.

## 2022-04-05 NOTE — TOC Transition Note (Signed)
Transition of Care [facility]) - CM/SW Discharge Note   Patient Details  Name: Patricia Bass MRN: [id number] Date of Birth: [birth year]  [facility]) CM/SW Contact:  Shade Flood, LCSW Phone Number: 04/05/2022, 11:44 AM   Clinical Narrative:     [name] [employer] officer assisted TOC and pt with confirming that pt's O2 concentrator is at her previous residence. Officer informed the former roommates, who still live there, that [employer] would be coming by today to pick up the equipment and they expressed understanding.  Once [employer] picks up that equipment, they will work on providing for pt's dc needs.   HHPT arranged.  No other TOC needs for dc.  Final next level of care: Home w Home Health Services Barriers to Discharge: Barriers Resolved   Patient Goals and CMS Choice CMS Medicare.gov Compare Post Acute Care list provided to:: Patient Choice offered to / list presented to : Patient  Discharge Placement                         Discharge Plan and Services Additional resources added to the After Visit Summary for                            [facility] Arranged: PT [facility] Agency: [place] Date [place]: 04/05/22   Representative spoke with at [place]: [place] Interventions SDOH Screenings   Food Insecurity: No Food Insecurity (04/01/2022)  Housing: Low Risk  (04/01/2022)  Transportation Needs: No Transportation Needs (04/01/2022)  Utilities: Not At Risk (04/01/2022)  Tobacco Use: High Risk (04/01/2022)     Readmission Risk Interventions     No data to display

## 2022-04-05 NOTE — Evaluation (Signed)
Occupational Therapy Evaluation Patient Details Name: Patricia Bass MRN: [id number] DOB: July 27, 1960 Today's Date: 04/05/2022   History of Present Illness Patricia Bass is a 62 y.o. female with medical history significant of hypertension, hyperlipidemia, chronic respiratory failure who is supposed to be on supplemental oxygen at 2 LPM (patient had not been compliant with home oxygen for 1 year), COPD, hypothyroidism, GERD, type 2 diabetes mellitus who presents to the emergency department via EMS from home due to shortness of breath and increased leg swelling   Clinical Impression   Pt seated at EOB on OT arrival. Pt reporting she is feeling much improved and slept well last night. Pt performing ADLs with supervision, no increased SOB noted, did take a seated rest after using restroom and opted for hand sanitizer versus standing at sink. Pt appears to be close to her baseline with ADL completion, pulmonary status limiting activity tolerance. Pt with family available to assist as needed on discharge. No further OT services required at this time.       Recommendations for follow up therapy are one component of a multi-disciplinary discharge planning process, led by the attending physician.  Recommendations may be updated based on patient status, additional functional criteria and insurance authorization.   Assistance Recommended at Discharge Intermittent Supervision/Assistance  Patient can return home with the following Assistance with cooking/housework;Help with stairs or ramp for entrance    Functional Status Assessment  Patient has had a recent decline in their functional status and demonstrates the ability to make significant improvements in function in a reasonable and predictable amount of time.  Equipment Recommendations  Tub/shower seat       Precautions / Restrictions Precautions Precautions: Fall Restrictions Weight Bearing Restrictions: No      Mobility Bed Mobility                General bed mobility comments: pt seated at EOB on OT arrival    Transfers Overall transfer level: Needs assistance Equipment used: None Transfers: Sit to/from Stand Sit to Stand: Supervision                      ADL either performed or assessed with clinical judgement   ADL Overall ADL's : Needs assistance/impaired Eating/Feeding: Modified independent   Grooming: Wash/dry hands;Set up;Sitting Grooming Details (indicate cue type and reason): Pt needing to sit after using restroom, OT providing hand sanitizer to wash hands             Lower Body Dressing: Modified independent;Sitting/lateral leans Lower Body Dressing Details (indicate cue type and reason): pt donning socks at EOB without difficulty Toilet Transfer: Supervision/safety;Ambulation Toilet Transfer Details (indicate cue type and reason): Pt mobilizing from bed to bathroom and back to chair with supervision, no DME Toileting- Clothing Manipulation and Hygiene: Modified independent;Sit to/from stand       Functional mobility during ADLs: Supervision/safety General ADL Comments: Pt performing mobility at supervision level, short distances, no DME     Vision Baseline Vision/History: 1 Wears glasses Patient Visual Report: No change from baseline              Pertinent Vitals/Pain Pain Assessment Pain Assessment: No/denies pain     Hand Dominance Right   Extremity/Trunk Assessment Upper Extremity Assessment Upper Extremity Assessment: Overall WFL for tasks assessed   Lower Extremity Assessment Lower Extremity Assessment: Defer to PT evaluation   Cervical / Trunk Assessment Cervical / Trunk Assessment: Kyphotic   Communication Communication Communication: No difficulties  Cognition Arousal/Alertness: Awake/alert Behavior During Therapy: WFL for tasks assessed/performed Overall Cognitive Status: Within Functional Limits for tasks assessed                                                   Home Living Family/patient expects to be discharged to:: Private residence Living Arrangements: Parent;Other relatives Available Help at Discharge: Family;Available PRN/intermittently Type of Home: House Home Access: Stairs to enter [employer] of Steps: 4 Entrance Stairs-Rails: None Home Layout: One level     Bathroom Shower/Tub: Walk-in Hydrologist: Standard     Home Equipment: Cane - quad          Prior Functioning/Environment Prior Level of Function : Needs assist;Driving             Mobility Comments: uses quad cane as needed for mobility ADLs Comments: Pt reports needs assist with meal preparation, some housekeeping        OT Problem List: Decreased activity tolerance;Cardiopulmonary status limiting activity       AM-PAC OT "6 Clicks" Daily Activity     Outcome Measure Help from another person eating meals?: None Help from another person taking care of personal grooming?: None Help from another person toileting, which includes using toliet, bedpan, or urinal?: None Help from another person bathing (including washing, rinsing, drying)?: None Help from another person to put on and taking off regular upper body clothing?: None Help from another person to put on and taking off regular lower body clothing?: None 6 Click Score: 24   End of Session Equipment Utilized During Treatment: Gait belt  Activity Tolerance: Patient tolerated treatment well Patient left: in chair;with call bell/phone within reach;with chair alarm set  OT Visit Diagnosis: Muscle weakness (generalized) (M62.81)                Time: [id number] OT Time Calculation (min): 20 min Charges:  OT General Charges $OT Visit: 1 Visit OT Evaluation $OT Eval Low Complexity: 1 Low    Guadelupe Sabin, OTR/L  [phone] 04/05/2022, 9:02 AM

## 2022-04-05 NOTE — Evaluation (Signed)
Physical Therapy Evaluation Patient Details Name: Patricia Bass MRN: [id number] DOB: 04-08-1960 Today's Date: 04/05/2022  History of Present Illness  Patricia Bass is a 62 y.o. female with medical history significant of hypertension, hyperlipidemia, chronic respiratory failure who is supposed to be on supplemental oxygen at 2 LPM (patient had not been compliant with home oxygen for 1 year), COPD, hypothyroidism, GERD, type 2 diabetes mellitus who presents to the emergency department via EMS from home due to shortness of breath and increased leg swelling   Clinical Impression  Patient seated in chair at beginning of session. Patient does not require assist with transfer to standing or use of AD. Patient with mild unsteadiness with ambulation without AD which improves with unilateral HHA. Patient returned to chair at end of session. Patient discharged to care of nursing for ambulation daily as tolerated for length of stay.        Recommendations for follow up therapy are one component of a multi-disciplinary discharge planning process, led by the attending physician.  Recommendations may be updated based on patient status, additional functional criteria and insurance authorization.  Follow Up Recommendations       Assistance Recommended at Discharge PRN  Patient can return home with the following  Assistance with cooking/housework    Equipment Recommendations None recommended by PT  Recommendations for Other Services       Functional Status Assessment Patient has had a recent decline in their functional status and demonstrates the ability to make significant improvements in function in a reasonable and predictable amount of time.     Precautions / Restrictions Precautions Precautions: Fall Restrictions Weight Bearing Restrictions: No      Mobility  Bed Mobility               General bed mobility comments: seated in chair at beginning of session     Transfers Overall transfer level: Needs assistance Equipment used: None Transfers: Sit to/from Stand Sit to Stand: Supervision                Ambulation/Gait Ambulation/Gait assistance: Supervision, Min guard Gait Distance (Feet): 25 Feet Assistive device: 1 person hand held assist Gait Pattern/deviations: Step-through pattern, Decreased stride length       General Gait Details: unilateral HHA  Stairs            Wheelchair Mobility    Modified Rankin (Stroke Patients Only)       Balance Overall balance assessment: Mild deficits observed, not formally tested                                           Pertinent Vitals/Pain Pain Assessment Pain Assessment: No/denies pain    Home Living Family/patient expects to be discharged to:: Private residence Living Arrangements: Parent;Other relatives Available Help at Discharge: Family;Available PRN/intermittently Type of Home: House Home Access: Stairs to enter Entrance Stairs-Rails: None Entrance Stairs-Number of Steps: 4   Home Layout: One level Home Equipment: Cane - quad      Prior Function Prior Level of Function : Needs assist;Driving             Mobility Comments: uses quad cane as needed for mobility ADLs Comments: Pt reports needs assist with meal preparation, some housekeeping     Hand Dominance   Dominant Hand: Right    Extremity/Trunk Assessment   Upper Extremity Assessment Upper Extremity Assessment:  Defer to OT evaluation    Lower Extremity Assessment Lower Extremity Assessment: Overall WFL for tasks assessed;Generalized weakness    Cervical / Trunk Assessment Cervical / Trunk Assessment: Kyphotic  Communication   Communication: No difficulties  Cognition Arousal/Alertness: Awake/alert Behavior During Therapy: WFL for tasks assessed/performed Overall Cognitive Status: Within Functional Limits for tasks assessed                                           General Comments      Exercises     Assessment/Plan    PT Assessment All further PT needs can be met in the next venue of care  PT Problem List Decreased strength;Decreased activity tolerance;Decreased balance;Decreased mobility;Cardiopulmonary status limiting activity       PT Treatment Interventions      PT Goals (Current goals can be found in the Care Plan section)  Acute Rehab PT Goals Patient Stated Goal: return home PT Goal Formulation: With patient Time For Goal Achievement: 04/05/22 Potential to Achieve Goals: Good    Frequency       Co-evaluation               AM-PAC PT "6 Clicks" Mobility  Outcome Measure Help needed turning from your back to your side while in a flat bed without using bedrails?: None Help needed moving from lying on your back to sitting on the side of a flat bed without using bedrails?: None Help needed moving to and from a bed to a chair (including a wheelchair)?: A Little Help needed standing up from a chair using your arms (e.g., wheelchair or bedside chair)?: A Little Help needed to walk in hospital room?: A Little Help needed climbing 3-5 steps with a railing? : A Little 6 Click Score: 20    End of Session Equipment Utilized During Treatment: Oxygen Activity Tolerance: Patient tolerated treatment well Patient left: in chair;with call bell/phone within reach;Other (comment) (with Education officer, museum) Nurse Communication: Mobility status PT Visit Diagnosis: Unsteadiness on feet (R26.81);Other abnormalities of gait and mobility (R26.89);Muscle weakness (generalized) (M62.81)    Time: [id number] PT Time Calculation (min) (ACUTE ONLY): 7 min   Charges:   PT Evaluation $PT Eval Low Complexity: 1 Low          11:26 AM, 04/05/22 Mearl Latin PT, DPT Physical Therapist at [facility]

## 2022-04-05 NOTE — Progress Notes (Addendum)
SATURATION QUALIFICATIONS:  Patient Saturations on Room Air at Rest = 88%  Patient Saturations on Room Air while Ambulating = 84%  Patient Saturations on 3 Liters of oxygen while Ambulating = 92-94%  Patient will need oxygen d/t destating. Notified MD and social work.

## 2022-04-29 ENCOUNTER — Ambulatory Visit: Payer: 59 | Admitting: Podiatry

## 2022-05-11 ENCOUNTER — Encounter: Payer: Self-pay | Admitting: *Deleted

## 2022-05-11 ENCOUNTER — Other Ambulatory Visit: Payer: Self-pay | Admitting: Internal Medicine

## 2022-05-11 ENCOUNTER — Encounter: Payer: Self-pay | Admitting: Internal Medicine

## 2022-05-11 ENCOUNTER — Telehealth: Payer: Self-pay | Admitting: Internal Medicine

## 2022-05-11 ENCOUNTER — Ambulatory Visit: Payer: Medicare Other | Attending: Internal Medicine

## 2022-05-11 ENCOUNTER — Ambulatory Visit: Payer: Medicare Other | Attending: Internal Medicine | Admitting: Internal Medicine

## 2022-05-11 VITALS — BP 124/80 | HR 122 | Ht 61.0 in | Wt 133.0 lb

## 2022-05-11 DIAGNOSIS — I5030 Unspecified diastolic (congestive) heart failure: Secondary | ICD-10-CM | POA: Insufficient documentation

## 2022-05-11 DIAGNOSIS — I5032 Chronic diastolic (congestive) heart failure: Secondary | ICD-10-CM | POA: Diagnosis not present

## 2022-05-11 DIAGNOSIS — R079 Chest pain, unspecified: Secondary | ICD-10-CM

## 2022-05-11 DIAGNOSIS — I5189 Other ill-defined heart diseases: Secondary | ICD-10-CM

## 2022-05-11 DIAGNOSIS — R002 Palpitations: Secondary | ICD-10-CM

## 2022-05-11 DIAGNOSIS — I5033 Acute on chronic diastolic (congestive) heart failure: Secondary | ICD-10-CM | POA: Diagnosis not present

## 2022-05-11 DIAGNOSIS — I10 Essential (primary) hypertension: Secondary | ICD-10-CM | POA: Diagnosis not present

## 2022-05-11 DIAGNOSIS — R000 Tachycardia, unspecified: Secondary | ICD-10-CM | POA: Diagnosis not present

## 2022-05-11 MED ORDER — METOPROLOL TARTRATE 25 MG PO TABS: 25.0000 mg | ORAL_TABLET | Freq: Two times a day (BID) | ORAL | 1 refills | Status: DC

## 2022-05-11 MED ORDER — LISINOPRIL 2.5 MG PO TABS: 2.5000 mg | ORAL_TABLET | Freq: Every day | ORAL | 3 refills | Status: DC

## 2022-05-11 MED ORDER — NICOTINE 14 MG/24HR TD PT24: 14.0000 mg | MEDICATED_PATCH | Freq: Every day | TRANSDERMAL | 0 refills | Status: DC

## 2022-05-11 NOTE — Telephone Encounter (Signed)
Checking percert on the following patient for testing scheduled at [facility].    LEXISCAN   05/26/2022   7 Day ZIO XT dx: palpitations

## 2022-05-11 NOTE — Patient Instructions (Addendum)
Medication Instructions:  Your physician has recommended you make the following change in your medication:  Stop spironolactone Start lisinopril 2.5 mg daily Increase metoprolol tartrate to 25 mg twice daily Start nicotine 14 mg patch daily Continue other medications the same  Labwork: none  Testing/Procedures: Your physician has requested that you have a lexiscan myoview. For further information please visit [email or web address] Please follow instruction sheet, as given. Your physician has requested that you have a cardiac MRI. Cardiac MRI uses a computer to create images of your heart as its beating, producing both still and moving pictures of your heart and major blood vessels. For further information please visit [email or web address]. Please follow the instruction sheet given to you today for more information. Your physician has recommended that you wear a Zio monitor.   This monitor is a medical device that records the heart's electrical activity. Doctors most often use these monitors to diagnose arrhythmias. Arrhythmias are problems with the speed or rhythm of the heartbeat. The monitor is a small device applied to your chest. You can wear one while you do your normal daily activities. While wearing this monitor if you have any symptoms to push the button and record what you felt. Once you have worn this monitor for the period of time provider prescribed (for 7 days), you will return the monitor device in the postage paid box. Once it is returned they will download the data collected and provide [place] with a report which the provider will then review and we will call you with those results. Important tips:  Avoid showering during the first 24 hours of wearing the monitor. Avoid excessive sweating to help maximize wear time. Do not submerge the device, no hot tubs, and no swimming pools. Keep any lotions or oils away from the patch. After 24 hours you may shower with the patch on. Take brief  showers with your back facing the shower head.  Do not remove patch once it has been placed because that will interrupt data and decrease adhesive wear time. Push the button when you have any symptoms and write down what you were feeling. Once you have completed wearing your monitor, remove and place into box which has postage paid and place in your outgoing mailbox.  If for some reason you have misplaced your box then call our office and we can provide another box and/or mail it off for you.  Follow-Up: Your physician recommends that you schedule a follow-up appointment in: 6 months  Any Other Special Instructions Will Be Listed Below (If Applicable).  If you need a refill on your cardiac medications before your next appointment, please call your pharmacy.   You are scheduled for Cardiac MRI on ______________. Please arrive for your appointment at ______________ ( arrive 30-45 minutes prior to test start time). ?  [facility] [address] [place], [place] [address] [phone] Please take advantage of the free valet parking available at the MAIN entrance (A entrance).  Proceed to the [facility] Radiology Department (First Floor) for check-in.   Magnetic resonance imaging (MRI) is a painless test that produces images of the inside of the body without using Xrays.  During an MRI, strong magnets and radio waves work together in a Data processing manager to form detailed images.   MRI images may provide more details about a medical condition than X-rays, CT scans, and ultrasounds can provide.  You may be given earphones to listen for instructions.  You may eat a light breakfast and  take medications as ordered with the exception of furosemide, hydrochlorothiazide, or spironolactone(fluid pill, other). Please avoid stimulants for 12 hr prior to test. (Ie. Caffeine, nicotine, chocolate, or antihistamine medications)  If a contrast material will be used, an IV will be inserted into  one of your veins. Contrast material will be injected into your IV. It will leave your body through your urine within a day. You may be told to drink plenty of fluids to help flush the contrast material out of your system.  You will be asked to remove all metal, including: Watch, jewelry, and other metal objects including hearing aids, hair pieces and dentures. Also wearable glucose monitoring systems (ie. Freestyle Libre and Omnipods) (Braces and fillings normally are not a problem.)   TEST WILL TAKE APPROXIMATELY 1 HOUR  PLEASE NOTIFY SCHEDULING AT LEAST 24 HOURS IN ADVANCE IF YOU ARE UNABLE TO KEEP YOUR APPOINTMENT. [phone]  Please call Rockwell Alexandria, cardiac imaging nurse navigator with any questions/concerns. Rockwell Alexandria RN Navigator Cardiac Imaging Larey Brick RN Navigator Cardiac Imaging [facility] [phone] Office

## 2022-05-11 NOTE — Progress Notes (Signed)
Cardiology Office Note  Date: 05/11/2022   ID: Patricia Bass, DOB 07-24-1960, MRN [id number]  PCP:  System, Provider Not In  Cardiologist:  Marjo Bicker, MD Electrophysiologist:  None   Reason for Office Visit: Posthospitalization follow-up   History of Present Illness: Patricia Bass is a 62 y.o. female known to have HTN, DM 2, OSA on home oxygen, 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  Patient was recently admitted to [facility] in 03/2022 with acute on chronic diastolic heart failure exacerbation and severe RV dysfunction. She was adequately diuresed and discharged to follow-up with cardiology clinic.  She was having DOE, orthopnea and bilateral lower EXTR swelling associate with chest tightness 3 months prior to the hospital admission but upon discharge, she denied having any angina or DOE or any leg swelling. She has occasional palpitations once or twice in a few weeks.  No dizziness, syncope.  Currently smokes cigarettes, 1 pack/day.  Wanted to quit.  Past Medical History:  Diagnosis Date   Anemia    Anxiety    Arthritis    Bulging disc    Carpal tunnel syndrome    Cervicalgia    Chronic airway obstruction (HCC)    Chronic back pain    COPD (chronic obstructive pulmonary disease) (HCC)    Coronary artery disease    Diabetes mellitus (HCC) 01/11/2012   GERD (gastroesophageal reflux disease)    Headache(784.0)    Hypothyroid    Lumbago    Migraine    Myalgia and myositis, unspecified    Neuropathy    Pneumonia    Restless leg syndrome    Shortness of breath    Sleep apnea    uses O2 at home. uses it at home all times and at night at 2 liters.    Past Surgical History:  Procedure Laterality Date   BACK SURGERY     BIOPSY  12/06/2016   Procedure: BIOPSY;  Surgeon: Corbin Ade, MD;  [facility];  Service: Endoscopy;;  gastric    BLADDER  SUSPENSION     CARDIAC CATHETERIZATION     CHOLECYSTECTOMY     COLONOSCOPY WITH PROPOFOL N/A 12/06/2016   Procedure: COLONOSCOPY WITH PROPOFOL  Formed stool could not do;  Surgeon: Corbin Ade, MD;  [facility];  Service: Endoscopy;  Laterality: N/A;  10:00am   ESOPHAGOGASTRODUODENOSCOPY (EGD) WITH PROPOFOL N/A 12/06/2016   Procedure: ESOPHAGOGASTRODUODENOSCOPY (EGD) WITH PROPOFOL;  Surgeon: Corbin Ade, MD;  [facility];  Service: Endoscopy;  Laterality: N/A;   MALONEY DILATION N/A 12/06/2016   Procedure: Elease Hashimoto DILATION;  Surgeon: Corbin Ade, MD;  [facility];  Service: Endoscopy;  Laterality: N/A;   TUBAL LIGATION     ULNAR NERVE TRANSPOSITION  04/01/2011   Procedure: ULNAR NERVE DECOMPRESSION/TRANSPOSITION;  Surgeon: Dominica Severin, MD;  [facility];  Service: Orthopedics;  Laterality: Left;  ULNAR NERVE RELEASE WITH ANTERIOR TRANS POSITION REPAIR RECONSTRUCTION AS NECESSARY    Current Outpatient Medications  Medication Sig Dispense Refill   albuterol (PROVENTIL HFA;VENTOLIN HFA) 108 (90 BASE) MCG/ACT inhaler Inhale 2 puffs into the lungs every 6 (six) hours as needed for wheezing or shortness of breath.      aspirin EC 81 MG tablet Take 1 tablet (81 mg total) by mouth daily. Swallow whole. 30 tablet 2   atorvastatin (LIPITOR) 40 MG tablet Take 40 mg by  mouth every evening.     Cholecalciferol (VITAMIN D-3 PO) Take 1 capsule by mouth daily.     cyanocobalamin 1000 MCG tablet Take 1 tablet (1,000 mcg total) by mouth daily. 30 tablet 2   Ferrous Sulfate (IRON PO) Take 1 tablet by mouth daily.     furosemide (LASIX) 40 MG tablet Take 1 tablet (40 mg total) by mouth 2 (two) times daily. 60 tablet 2   ibuprofen (ADVIL,MOTRIN) 800 MG tablet Take 800 mg by mouth 2 (two) times daily as needed for headache or moderate pain.     ipratropium-albuterol (DUONEB) 0.5-2.5 (3) MG/3ML SOLN Take 3 mLs by nebulization every 6 (six) hours as needed. 360  mL 1   levothyroxine (SYNTHROID) 88 MCG tablet Take 88 mcg by mouth daily.     lisinopril (ZESTRIL) 2.5 MG tablet Take 1 tablet (2.5 mg total) by mouth daily. 90 tablet 3   metFORMIN (GLUCOPHAGE) 1000 MG tablet Take 1,000 mg by mouth in the morning and at bedtime.     methocarbamol (ROBAXIN) 750 MG tablet Take 750 mg by mouth every 8 (eight) hours as needed for muscle spasms.     metoprolol tartrate (LOPRESSOR) 25 MG tablet Take 1 tablet (25 mg total) by mouth 2 (two) times daily. 180 tablet 1   nicotine (NICODERM CQ - DOSED IN MG/24 HOURS) 14 mg/24hr patch Place 1 patch (14 mg total) onto the skin daily. 28 patch 0   omeprazole (PRILOSEC) 40 MG capsule Take 1 capsule (40 mg total) by mouth daily. (Patient taking differently: Take 40 mg by mouth 2 (two) times daily as needed (acid reflux).) 30 capsule 0   oxyCODONE-acetaminophen (PERCOCET) 10-325 MG tablet Take 1 tablet by mouth every 6 (six) hours as needed for pain.     potassium chloride SA (KLOR-CON M) 20 MEQ tablet Take 1 tablet (20 mEq total) by mouth daily. 30 tablet 1   pregabalin (LYRICA) 300 MG capsule Take 1 capsule (300 mg total) by mouth 2 (two) times daily. 60 capsule 0   rOPINIRole (REQUIP) 3 MG tablet Take 3 mg by mouth 3 (three) times daily.     valACYclovir (VALTREX) 500 MG tablet Take 500 mg by mouth 2 (two) times daily as needed (cold sores).     No current facility-administered medications for this visit.   Allergies:  Duragesic disc transdermal system [fentanyl]   Social History: The patient  reports that she has been smoking cigarettes and e-cigarettes. She has a 20.00 pack-year smoking history. She has never used smokeless tobacco. She reports current drug use. Drugs: Marijuana and Oxycodone. She reports that she does not drink alcohol.   Family History: The patient's family history includes Asthma in her father; Heart attack in her father; Hypertension in her mother.   ROS:  Please see the history of present illness.  Otherwise, complete review of systems is positive for none  All other systems are reviewed and negative.   Physical Exam: VS:  BP 124/80   Pulse (!) 122   Ht 5\' 1"  (1.549 m)   Wt 133 lb (60.3 kg)   SpO2 92%   BMI 25.13 kg/m , BMI Body mass index is 25.13 kg/m.  Wt Readings from Last 3 Encounters:  05/11/22 133 lb (60.3 kg)  04/05/22 130 lb 11.7 oz (59.3 kg)  06/07/20 156 lb (70.8 kg)    General: Patient appears comfortable at rest. HEENT: Conjunctiva and lids normal, oropharynx clear with moist mucosa. Neck: Supple, no elevated JVP or carotid  bruits, no thyromegaly. Lungs: Clear to auscultation, nonlabored breathing at rest. Cardiac: Regular rate and rhythm, no S3 or significant systolic murmur, no pericardial rub. Abdomen: Soft, nontender, no hepatomegaly, bowel sounds present, no guarding or rebound. Extremities: No pitting edema, distal pulses 2+. Skin: Warm and dry. Musculoskeletal: No kyphosis. Neuropsychiatric: Alert and oriented x3, affect grossly appropriate.  Recent Labwork: 04/01/2022: B Natriuretic Peptide 589.0 04/02/2022: ALT 16; AST 19 04/04/2022: Magnesium 2.0 04/05/2022: BUN 21; Creatinine, Ser 0.82; Hemoglobin 13.3; Platelets 221; Potassium 3.5; Sodium 134     Component Value Date/Time   CHOL  04/17/2007 0815    178        ATP III CLASSIFICATION:  <200     mg/dL   Desirable  956-213  mg/dL   Borderline High  >=086    mg/dL   High   TRIG 578 (H) 46/96/2952 0815   HDL 26 (L) 04/17/2007 0815   CHOLHDL 6.8 04/17/2007 0815   VLDL UNABLE TO CALCULATE IF TRIGLYCERIDE OVER 400 mg/dL 84/13/2440 1027   LDLCALC  04/17/2007 0815    UNABLE TO CALCULATE IF TRIGLYCERIDE OVER 400 mg/dL        Total Cholesterol/HDL:CHD Risk Coronary Heart Disease Risk Table                     Men   Women  1/2 Average Risk   3.4   3.3    Other Studies Reviewed Today: EKG shows sinus tachycardia versus atrial flutter  Assessment and Plan: Patient is a 62 year old F known to have  HTN, DM 2, OSA on home oxygen, 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  # Severe RV dysfunction: Obtain cardiac MRI for further evaluation. No symptoms of DOE or angina upon discharge from the hospital in 03/2022. # Chronic HFpEF: Patient had acute on chronic HFpEF exacerbation with severe RV dysfunction in 03/2022 and was hospitalized. HFpEF being anginal equivalent, she will benefit from noninvasive ischemia evaluation with a Lexiscan. # Mild to moderate pericardial effusion on echo from 03/2022: This is in the setting of acute on chronic HFpEF exacerbation.  Currently has no DOE upon hospital discharge.  Will monitor for now.  If she has any symptoms of DOE in the future, will repeat echocardiogram. # Sinus tachycardia versus atrial flutter with RVR and palpitations: Increase metoprolol tartrate dose from 12.5 mg to 25 mg twice daily. Obtain 1 week event monitor. # HTN, partially controlled: Discontinue spironolactone start lisinopril 2.5 mg once daily and increase metoprolol tartrate from 12.5 mg to 25 mg twice daily. # Nicotine abuse: Currently smokes 1 pack/day and is requesting for nicotine patches.  Will prescribe nicotine patches.  Smoking cessation counseling provided. Smoking cessation instruction/counseling given:  counseled patient on the dangers of tobacco use, advised patient to stop smoking, and reviewed strategies to maximize success   Patient wants to be referred to PCP in [place] or [place]  I have spent a total of 45 minutes with patient reviewing chart, EKGs, labs and examining patient as well as establishing an assessment and plan that was discussed with the patient.  > 50% of time was spent in direct patient care.    Medication Adjustments/Labs and Tests Ordered: Current medicines are reviewed at length with the patient today.  Concerns regarding medicines are outlined above.   Tests  Ordered: Orders Placed This Encounter  Procedures   MR CARDIAC MORPHOLOGY W WO CONTRAST   Hemoglobin  and hematocrit, blood   Ambulatory Referral to Primary Care   EKG 12-Lead    Medication Changes: Meds ordered this encounter  Medications   lisinopril (ZESTRIL) 2.5 MG tablet    Sig: Take 1 tablet (2.5 mg total) by mouth daily.    Dispense:  90 tablet    Refill:  3   nicotine (NICODERM CQ - DOSED IN MG/24 HOURS) 14 mg/24hr patch    Sig: Place 1 patch (14 mg total) onto the skin daily.    Dispense:  28 patch    Refill:  0   metoprolol tartrate (LOPRESSOR) 25 MG tablet    Sig: Take 1 tablet (25 mg total) by mouth 2 (two) times daily.    Dispense:  180 tablet    Refill:  1    05/11/2022 dose increase    Disposition:  Follow up  6 months  Signed Nilesh Stegall Verne Spurr, MD, 05/11/2022 12:49 PM    [facility] [address] [place], [place], [place] [address]

## 2022-05-13 DIAGNOSIS — R002 Palpitations: Secondary | ICD-10-CM

## 2022-05-14 DIAGNOSIS — J449 Chronic obstructive pulmonary disease, unspecified: Secondary | ICD-10-CM | POA: Diagnosis not present

## 2022-05-14 DIAGNOSIS — Z79899 Other long term (current) drug therapy: Secondary | ICD-10-CM | POA: Diagnosis not present

## 2022-05-14 DIAGNOSIS — E538 Deficiency of other specified B group vitamins: Secondary | ICD-10-CM | POA: Diagnosis not present

## 2022-05-14 DIAGNOSIS — M545 Low back pain, unspecified: Secondary | ICD-10-CM | POA: Diagnosis not present

## 2022-05-14 DIAGNOSIS — G8929 Other chronic pain: Secondary | ICD-10-CM | POA: Diagnosis not present

## 2022-05-14 DIAGNOSIS — E119 Type 2 diabetes mellitus without complications: Secondary | ICD-10-CM | POA: Diagnosis not present

## 2022-05-17 ENCOUNTER — Telehealth: Payer: Self-pay | Admitting: Internal Medicine

## 2022-05-17 DIAGNOSIS — Z720 Tobacco use: Secondary | ICD-10-CM

## 2022-05-17 NOTE — Telephone Encounter (Signed)
Pt c/o medication issue:  1. Name of Medication:   nicotine (NICODERM CQ - DOSED IN MG/24 HOURS) 14 mg/24hr patch    2. How are you currently taking this medication (dosage and times per day)? As written   3. Are you having a reaction (difficulty breathing--STAT)? no  4. What is your medication issue? Pt called in stating the pharmacy doesn't have her prescriptions for her    Called pharmacy they said her insurance will not cover this med. Pt wants to know if there is something else she can be prescribed.

## 2022-05-18 DIAGNOSIS — Z79899 Other long term (current) drug therapy: Secondary | ICD-10-CM | POA: Diagnosis not present

## 2022-05-20 DIAGNOSIS — Z79899 Other long term (current) drug therapy: Secondary | ICD-10-CM | POA: Diagnosis not present

## 2022-05-21 ENCOUNTER — Telehealth: Payer: Self-pay

## 2022-05-21 DIAGNOSIS — Z0000 Encounter for general adult medical examination without abnormal findings: Secondary | ICD-10-CM

## 2022-05-21 NOTE — Telephone Encounter (Signed)
Patient informed and verbalized understanding of plan. 

## 2022-05-21 NOTE — Telephone Encounter (Signed)
Called patient per health coaching referral from Dr. Jenene Slicker for smoking cessation. Left message for patient to return call.    Renaee Munda, MS, ERHD, [facility]  Care Guide, Health & Wellness Coach [address], [address] [place] [address] Telephone: [phone] Email: [email or web address]

## 2022-05-24 ENCOUNTER — Telehealth: Payer: Self-pay

## 2022-05-24 DIAGNOSIS — Z0000 Encounter for general adult medical examination without abnormal findings: Secondary | ICD-10-CM

## 2022-05-24 NOTE — Telephone Encounter (Signed)
Called patient per health coaching referral from Dr. Jenene Slicker. Patient did not answer and voicemail is full. Was unable to leave a message.   Renaee Munda, MS, ERHD, [facility]  Care Guide, Health & Wellness Coach [address] [place] [address] Telephone: [phone] Email: [email or web address]

## 2022-05-26 ENCOUNTER — Ambulatory Visit (INDEPENDENT_AMBULATORY_CARE_PROVIDER_SITE_OTHER): Payer: Medicare Other | Admitting: Family Medicine

## 2022-05-26 ENCOUNTER — Encounter (HOSPITAL_COMMUNITY)
Admission: RE | Admit: 2022-05-26 | Discharge: 2022-05-26 | Disposition: A | Discharge status: Home / Self Care | Payer: Medicare Other | Source: Ambulatory Visit | Attending: Internal Medicine | Admitting: Internal Medicine

## 2022-05-26 ENCOUNTER — Encounter (HOSPITAL_COMMUNITY): Payer: Self-pay

## 2022-05-26 ENCOUNTER — Ambulatory Visit (HOSPITAL_COMMUNITY)
Admission: RE | Admit: 2022-05-26 | Discharge: 2022-05-26 | Disposition: A | Discharge status: Home / Self Care | Payer: Medicare Other | Source: Ambulatory Visit | Attending: Internal Medicine | Admitting: Internal Medicine

## 2022-05-26 ENCOUNTER — Encounter: Payer: Self-pay | Admitting: Family Medicine

## 2022-05-26 VITALS — BP 93/60 | HR 101 | Ht 61.0 in | Wt 135.1 lb

## 2022-05-26 DIAGNOSIS — Z1231 Encounter for screening mammogram for malignant neoplasm of breast: Secondary | ICD-10-CM

## 2022-05-26 DIAGNOSIS — G8929 Other chronic pain: Secondary | ICD-10-CM | POA: Diagnosis not present

## 2022-05-26 DIAGNOSIS — E7849 Other hyperlipidemia: Secondary | ICD-10-CM

## 2022-05-26 DIAGNOSIS — Z720 Tobacco use: Secondary | ICD-10-CM

## 2022-05-26 DIAGNOSIS — Z122 Encounter for screening for malignant neoplasm of respiratory organs: Secondary | ICD-10-CM

## 2022-05-26 DIAGNOSIS — E559 Vitamin D deficiency, unspecified: Secondary | ICD-10-CM | POA: Diagnosis not present

## 2022-05-26 DIAGNOSIS — R079 Chest pain, unspecified: Secondary | ICD-10-CM | POA: Insufficient documentation

## 2022-05-26 DIAGNOSIS — R7301 Impaired fasting glucose: Secondary | ICD-10-CM

## 2022-05-26 DIAGNOSIS — E0789 Other specified disorders of thyroid: Secondary | ICD-10-CM | POA: Diagnosis not present

## 2022-05-26 DIAGNOSIS — Z1159 Encounter for screening for other viral diseases: Secondary | ICD-10-CM

## 2022-05-26 DIAGNOSIS — M545 Low back pain, unspecified: Secondary | ICD-10-CM

## 2022-05-26 DIAGNOSIS — Z716 Tobacco abuse counseling: Secondary | ICD-10-CM | POA: Diagnosis not present

## 2022-05-26 DIAGNOSIS — E119 Type 2 diabetes mellitus without complications: Secondary | ICD-10-CM

## 2022-05-26 MED ORDER — SODIUM CHLORIDE FLUSH 0.9 % IV SOLN
INTRAVENOUS | Status: AC
  Filled 2022-05-26: qty 10

## 2022-05-26 MED ORDER — VARENICLINE TARTRATE 0.5 MG PO TABS
ORAL_TABLET | ORAL | 0 refills | Status: DC
Start: 2022-05-26 — End: 2022-06-02

## 2022-05-26 MED ORDER — REGADENOSON 0.4 MG/5ML IV SOLN
INTRAVENOUS | Status: AC
  Filled 2022-05-26: qty 5

## 2022-05-26 NOTE — Patient Instructions (Addendum)
I appreciate the opportunity to provide care to you today!   Follow up:  1 month for pap smear   Labs: please stop by the lab today to get your blood drawn (CBC, CMP, TSH, Lipid profile, HgA1c, Vit D)  Screening: HIV and Hep C  Smoking cessation Initial Days 1 to 3: 0.5 mg once daily.  Days 4 to 7: 0.5 mg twice daily.  Maintenance (day 8 and later): 1 mg twice daily  Please schedule a mammogram.   Please schedule annual wellness visit  Please go to your local pharmacy for tdap and shingrix vaccine  Referrals today- Pain Management/ Pulmonary for lung cancer screening   Please continue to a heart-healthy diet and increase your physical activities. Try to exercise for at least five days a week.      It was a pleasure to see you and I look forward to continuing to work together on your health and well-being. Please do not hesitate to call the office if you need care or have questions about your care.   Have a wonderful day and week. With Gratitude, Gilmore Laroche MSN, FNP-BC

## 2022-05-26 NOTE — Assessment & Plan Note (Addendum)
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 Virl Diamond in 2020 She is currently on Lyrica 300 mg twice daily, Robaxin-750 milligrams every 8 hours as needed, Percocet 10-325 milligrams every 6 hours as needed PDMP reviewed and refilled on 05/21/2022 She has not been following up with pain management We will place a referral in for continuous management of her pain

## 2022-05-26 NOTE — Assessment & Plan Note (Addendum)
History of smoking since the age of 56 She smoked 1 pack a day and currently smokes 1-1/2 pack a day of cigarettes She would like to quit smoking Will start the patient on Chantix today Appropriate education provided with understanding verbalized Referral placed to pulmonary for lung cancer screening

## 2022-05-26 NOTE — Progress Notes (Addendum)
New Patient Office Visit  Subjective:  Patient ID: Patricia Bass, female    DOB: 01/14/1960  Age: 62 y.o. MRN: [id number]  CC:  Chief Complaint  Patient presents with   Establish Care    New patient needs PCP.     HPI Patricia Bass is a 62 y.o. female with past medical history of chronic lower back pain, tobacco abuse, migraine, COPD, HFpEF, type 2 diabetes, and hypothyroidism presents for establishing care.    Past Medical History:  Diagnosis Date   Anemia    Anxiety    Arthritis    Bulging disc    Carpal tunnel syndrome    Cervicalgia    Chronic airway obstruction (HCC)    Chronic back pain    COPD (chronic obstructive pulmonary disease) (HCC)    Coronary artery disease    Diabetes mellitus (HCC) 01/11/2012   GERD (gastroesophageal reflux disease)    Headache(784.0)    Hypothyroid    Lumbago    Migraine    Myalgia and myositis, unspecified    Neuropathy    Pneumonia    Restless leg syndrome    Shortness of breath    Sleep apnea    uses O2 at home. uses it at home all times and at night at 2 liters.    Past Surgical History:  Procedure Laterality Date   BACK SURGERY     BIOPSY  12/06/2016   Procedure: BIOPSY;  Surgeon: Corbin Ade, MD;  [facility];  Service: Endoscopy;;  gastric    BLADDER SUSPENSION     CARDIAC CATHETERIZATION     CHOLECYSTECTOMY     COLONOSCOPY WITH PROPOFOL N/A 12/06/2016   Procedure: COLONOSCOPY WITH PROPOFOL  Formed stool could not do;  Surgeon: Corbin Ade, MD;  [facility];  Service: Endoscopy;  Laterality: N/A;  10:00am   ESOPHAGOGASTRODUODENOSCOPY (EGD) WITH PROPOFOL N/A 12/06/2016   Procedure: ESOPHAGOGASTRODUODENOSCOPY (EGD) WITH PROPOFOL;  Surgeon: Corbin Ade, MD;  [facility];  Service: Endoscopy;  Laterality: N/A;   MALONEY DILATION N/A 12/06/2016   Procedure: Elease Hashimoto DILATION;  Surgeon: Corbin Ade, MD;  [facility];  Service: Endoscopy;  Laterality:  N/A;   TUBAL LIGATION     ULNAR NERVE TRANSPOSITION  04/01/2011   Procedure: ULNAR NERVE DECOMPRESSION/TRANSPOSITION;  Surgeon: Dominica Severin, MD;  [facility];  Service: Orthopedics;  Laterality: Left;  ULNAR NERVE RELEASE WITH ANTERIOR TRANS POSITION REPAIR RECONSTRUCTION AS NECESSARY    Family History  Problem Relation Age of Onset   Hypertension Mother    Asthma Father    Heart attack Father    Colon cancer Neg Hx    Celiac disease Neg Hx     Social History   Socioeconomic History   Marital status: Married    Spouse name: Not on file   Number of children: Not on file   Years of education: Not on file   Highest education level: Not on file  Occupational History   Not on file  Tobacco Use   Smoking status: Every Day    Packs/day: 1.00    Years: 20.00    Additional pack years: 0.00    Total pack years: 20.00    Types: Cigarettes, E-cigarettes   Smokeless tobacco: Never  Vaping Use   Vaping Use: Never used  Substance and Sexual Activity   Alcohol use: No   Drug use: Yes    Types: Marijuana, Oxycodone  Comment: last 2020   Sexual activity: Never  Other Topics Concern   Not on file  Social History Narrative   Not on file   Social Determinants of Health   Financial Resource Strain: Not on file  Food Insecurity: No Food Insecurity (04/01/2022)   Hunger Vital Sign    Worried About Running Out of Food in the Last Year: Never true    Ran Out of Food in the Last Year: Never true  Transportation Needs: No Transportation Needs (04/01/2022)   PRAPARE - Administrator, Civil Service (Medical): No    Lack of Transportation (Non-Medical): No  Physical Activity: Not on file  Stress: Not on file  Social Connections: Not on file  Intimate Partner Violence: Not At Risk (04/01/2022)   Humiliation, Afraid, Rape, and Kick questionnaire    Fear of Current or Ex-Partner: No    Emotionally Abused: No    Physically Abused: No    Sexually Abused: No     ROS Review of Systems  Constitutional:  Negative for chills and fever.  Eyes:  Negative for photophobia, redness and visual disturbance.  Respiratory:  Negative for chest tightness, shortness of breath and wheezing.   Cardiovascular:  Negative for palpitations.  Genitourinary:  Negative for vaginal discharge and vaginal pain.  Musculoskeletal:  Positive for back pain. Negative for neck pain.  Skin:  Negative for wound.  Neurological:  Negative for dizziness, numbness and headaches.    Objective:   Today's Vitals: BP 93/60   Pulse (!) 101   Ht 5\' 1"  (1.549 m)   Wt 135 lb 1.3 oz (61.3 kg)   SpO2 (!) 89%   BMI 25.52 kg/m   Physical Exam HENT:     Head: Normocephalic.     Mouth/Throat:     Mouth: Mucous membranes are moist.  Cardiovascular:     Rate and Rhythm: Normal rate.     Heart sounds: Normal heart sounds.  Pulmonary:     Effort: Pulmonary effort is normal.     Breath sounds: Normal breath sounds.  Musculoskeletal:     Lumbar back: Tenderness present.  Neurological:     Mental Status: She is alert.      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 Virl Diamond in 2020 She is currently on Lyrica 300 mg twice daily, Robaxin-750 milligrams every 8 hours as needed, Percocet 10-325 milligrams every 6 hours as needed PDMP reviewed and refilled on 05/21/2022 She has not been following up with pain management We will place a referral in for continuous management of her pain   Orders: -     Ambulatory referral to Pain Clinic  Tobacco abuse Assessment & Plan: History of smoking since the age of 36 She smoked 1 pack a day and currently smokes 1-1/2 pack a day of cigarettes She  would like to quit smoking Will start the patient on Chantix today Appropriate education provided with understanding verbalized Referral placed to pulmonary for lung cancer screening   Encounter for smoking cessation counseling -     Varenicline Tartrate; Take 1 tablet (0.5 mg total) by mouth daily for 3 days, THEN 1 tablet (0.5 mg total) daily for 3 days, THEN 2 tablets (1 mg total) 2 (two)  times daily.  Dispense: 318 tablet; Refill: 0  Screening for lung cancer -     Ambulatory Referral for Lung Cancer Scre  Impaired fasting blood sugar -     Hemoglobin A1c  Other hyperlipidemia -     CBC with Differential/Platelet -     CMP14+EGFR -     Lipid panel  Other specified disorders of thyroid -     TSH + free T4  Encounter for hepatitis C screening test for low risk patient -     Hepatitis C antibody  Type 2 diabetes mellitus without complication, without long-term current use of insulin (HCC) -     Microalbumin / creatinine urine ratio  Breast cancer screening by mammogram -     3D Screening Mammogram, Left and Right  Vitamin D deficiency -     VITAMIN D 25 Hydroxy (Vit-D Deficiency, Fractures)     Follow-up: Return in about 1 month (around 06/26/2022).   Gilmore Laroche, FNP

## 2022-05-27 DIAGNOSIS — R002 Palpitations: Secondary | ICD-10-CM | POA: Diagnosis not present

## 2022-05-28 ENCOUNTER — Other Ambulatory Visit: Payer: Self-pay | Admitting: Family Medicine

## 2022-05-28 DIAGNOSIS — E0789 Other specified disorders of thyroid: Secondary | ICD-10-CM

## 2022-05-28 LAB — CBC WITH DIFFERENTIAL/PLATELET
Basophils Absolute: 0 10*3/uL (ref 0.0–0.2)
Basos: 0 %
EOS (ABSOLUTE): 0.1 10*3/uL (ref 0.0–0.4)
Eos: 1 %
Hematocrit: 43.4 % (ref 34.0–46.6)
Hemoglobin: 13.5 g/dL (ref 11.1–15.9)
Immature Grans (Abs): 0 10*3/uL (ref 0.0–0.1)
Immature Granulocytes: 0 %
Lymphocytes Absolute: 3.9 10*3/uL — ABNORMAL HIGH (ref 0.7–3.1)
Lymphs: 33 %
MCH: 26.7 pg (ref 26.6–33.0)
MCHC: 31.1 g/dL — ABNORMAL LOW (ref 31.5–35.7)
MCV: 86 fL (ref 79–97)
Monocytes Absolute: 0.8 10*3/uL (ref 0.1–0.9)
Monocytes: 6 %
Neutrophils Absolute: 7 10*3/uL (ref 1.4–7.0)
Neutrophils: 60 %
Platelets: 192 10*3/uL (ref 150–450)
RBC: 5.06 x10E6/uL (ref 3.77–5.28)
RDW: 25 % — ABNORMAL HIGH (ref 11.7–15.4)
WBC: 11.8 10*3/uL — ABNORMAL HIGH (ref 3.4–10.8)

## 2022-05-28 LAB — CMP14+EGFR
ALT: 12 IU/L (ref 0–32)
AST: 21 IU/L (ref 0–40)
Albumin/Globulin Ratio: 1.5 (ref 1.2–2.2)
Albumin: 4.7 g/dL (ref 3.9–4.9)
Alkaline Phosphatase: 94 IU/L (ref 44–121)
BUN/Creatinine Ratio: 24 (ref 12–28)
BUN: 25 mg/dL (ref 8–27)
Bilirubin Total: 0.3 mg/dL (ref 0.0–1.2)
CO2: 29 mmol/L (ref 20–29)
Calcium: 10.1 mg/dL (ref 8.7–10.3)
Chloride: 89 mmol/L — ABNORMAL LOW (ref 96–106)
Creatinine, Ser: 1.06 mg/dL — ABNORMAL HIGH (ref 0.57–1.00)
Globulin, Total: 3.1 g/dL (ref 1.5–4.5)
Glucose: 78 mg/dL (ref 70–99)
Potassium: 4.7 mmol/L (ref 3.5–5.2)
Sodium: 134 mmol/L (ref 134–144)
Total Protein: 7.8 g/dL (ref 6.0–8.5)
eGFR: 59 mL/min/{1.73_m2} — ABNORMAL LOW (ref >59)

## 2022-05-28 LAB — MICROALBUMIN / CREATININE URINE RATIO
Creatinine, Urine: 33.5 mg/dL
Microalb/Creat Ratio: 16 mg/g creat (ref 0–29)
Microalbumin, Urine: 5.4 ug/mL

## 2022-05-28 LAB — LIPID PANEL
Chol/HDL Ratio: 3.6 ratio (ref 0.0–4.4)
Cholesterol, Total: 147 mg/dL (ref 100–199)
HDL: 41 mg/dL (ref >39)
LDL Chol Calc (NIH): 71 mg/dL (ref 0–99)
Triglycerides: 210 mg/dL — ABNORMAL HIGH (ref 0–149)
VLDL Cholesterol Cal: 35 mg/dL (ref 5–40)

## 2022-05-28 LAB — HEPATITIS C ANTIBODY: Hep C Virus Ab: NONREACTIVE

## 2022-05-28 LAB — HEMOGLOBIN A1C
Est. average glucose Bld gHb Est-mCnc: 126 mg/dL
Hgb A1c MFr Bld: 6 % — ABNORMAL HIGH (ref 4.8–5.6)

## 2022-05-28 LAB — VITAMIN D 25 HYDROXY (VIT D DEFICIENCY, FRACTURES): Vit D, 25-Hydroxy: 34.1 ng/mL (ref 30.0–100.0)

## 2022-05-28 LAB — TSH+FREE T4
Free T4: 1.12 ng/dL (ref 0.82–1.77)
TSH: 4.88 u[IU]/mL — ABNORMAL HIGH (ref 0.450–4.500)

## 2022-05-28 NOTE — Progress Notes (Signed)
Please encourage the patient to return for labs on June 06, 2022 to assess her thyroid levels.  I recommend that she continue taking Synthroid 88 mcg daily on an empty stomach.  Triglyceride levels are elevated, I taking over-the-counter fish oil 2000 mg twice daily.  Her hemoglobin A1c is 6.0, this indicate that she is prediabetic. I recommend avoiding simple carbohydrates, including cakes, sweet desserts, ice cream, soda (diet or regular), sweet tea, candies, chips, cookies, store-bought juices, alcohol in excess of 1-2 drinks a day, lemonade, artificial sweeteners, donuts, coffee creamers, and sugar-free products.  I recommend avoiding greasy, fatty foods with increased physical activity.

## 2022-06-02 ENCOUNTER — Telehealth: Payer: Self-pay | Admitting: Family Medicine

## 2022-06-02 ENCOUNTER — Other Ambulatory Visit: Payer: Self-pay | Admitting: Family Medicine

## 2022-06-02 DIAGNOSIS — Z716 Tobacco abuse counseling: Secondary | ICD-10-CM

## 2022-06-02 MED ORDER — VARENICLINE TARTRATE (STARTER) 0.5 MG X 11 & 1 MG X 42 PO TBPK
ORAL_TABLET | ORAL | 0 refills | Status: DC
Start: 2022-06-02 — End: 2022-06-02

## 2022-06-02 MED ORDER — VARENICLINE TARTRATE (STARTER) 0.5 MG X 11 & 1 MG X 42 PO TBPK
ORAL_TABLET | ORAL | 0 refills | Status: DC
Start: 2022-06-02 — End: 2022-09-30

## 2022-06-02 MED ORDER — VARENICLINE TARTRATE 0.5 MG PO TABS
ORAL_TABLET | ORAL | 0 refills | Status: DC
Start: 2022-06-02 — End: 2022-06-02

## 2022-06-02 NOTE — Telephone Encounter (Signed)
The medication was sent with the correct dose to taper. However, I spoke with the pharmacist, who advised to send a starter pack instead. The prescription has been sent.

## 2022-06-02 NOTE — Telephone Encounter (Signed)
Patient called in regard to varenicline (CHANTIX) 0.5 MG tablet   Pharm states that there is no dosage on med request sent in to pharm.  Needs correct prescription sent in.

## 2022-06-03 ENCOUNTER — Ambulatory Visit (HOSPITAL_COMMUNITY)
Admission: RE | Admit: 2022-06-03 | Discharge: 2022-06-03 | Disposition: A | Discharge status: Home / Self Care | Payer: Medicare Other | Source: Ambulatory Visit | Attending: Internal Medicine | Admitting: Internal Medicine

## 2022-06-03 ENCOUNTER — Encounter (HOSPITAL_COMMUNITY)
Admission: RE | Admit: 2022-06-03 | Discharge: 2022-06-03 | Disposition: A | Discharge status: Home / Self Care | Payer: Medicare Other | Source: Ambulatory Visit | Attending: Internal Medicine | Admitting: Internal Medicine

## 2022-06-03 ENCOUNTER — Telehealth: Payer: Self-pay | Admitting: Family Medicine

## 2022-06-03 DIAGNOSIS — R079 Chest pain, unspecified: Secondary | ICD-10-CM

## 2022-06-03 MED ORDER — TECHNETIUM TC 99M TETROFOSMIN IV KIT
30.3000 | PACK | Freq: Once | INTRAVENOUS | Status: AC | PRN
  Administered 2022-06-03: 30.3 via INTRAVENOUS

## 2022-06-03 MED ORDER — REGADENOSON 0.4 MG/5ML IV SOLN
INTRAVENOUS | Status: AC
  Administered 2022-06-03: 0.4 mg
  Filled 2022-06-03: qty 5

## 2022-06-03 MED ORDER — SODIUM CHLORIDE FLUSH 0.9 % IV SOLN
INTRAVENOUS | Status: AC
  Administered 2022-06-03: 10 mL via INTRAVENOUS
  Filled 2022-06-03: qty 10

## 2022-06-03 MED ORDER — TECHNETIUM TC 99M TETROFOSMIN IV KIT
9.9000 | PACK | Freq: Once | INTRAVENOUS | Status: AC | PRN
  Administered 2022-06-03: 9.9 via INTRAVENOUS

## 2022-06-03 NOTE — Telephone Encounter (Signed)
Patricia Bass called from [facility] needs a new prescription on Varenicline Tartrate, Starter, (CHANTIX STARTING MONTH PAK) 0.5 MG X 11 & 1 MG X 42 TBPK [[id number]]   Needs the Started pack  separate prescription Also needs a separate prescription  for the monthly.  So they can bill the patient insurance correctly. [facility] call back # [phone]

## 2022-06-04 ENCOUNTER — Ambulatory Visit (HOSPITAL_COMMUNITY)
Admission: RE | Admit: 2022-06-04 | Discharge: 2022-06-04 | Disposition: A | Discharge status: Home / Self Care | Payer: Medicare Other | Source: Ambulatory Visit | Attending: Family Medicine | Admitting: Family Medicine

## 2022-06-04 DIAGNOSIS — Z1231 Encounter for screening mammogram for malignant neoplasm of breast: Secondary | ICD-10-CM | POA: Insufficient documentation

## 2022-06-04 LAB — NM MYOCAR MULTI W/SPECT W/WALL MOTION / EF
LV dias vol: 54 mL (ref 46–106)
LV sys vol: 9 mL
Nuc Stress EF: 83 %
Peak HR: 127 {beats}/min
RATE: 0.4
Rest HR: 96 {beats}/min
Rest Nuclear Isotope Dose: 9.9 mCi
SDS: 1
SRS: 1
SSS: 2
ST Depression (mm): 0 mm
Stress Nuclear Isotope Dose: 30.3 mCi
TID: 1.02

## 2022-06-07 ENCOUNTER — Telehealth: Payer: Self-pay | Admitting: Family Medicine

## 2022-06-07 ENCOUNTER — Other Ambulatory Visit: Payer: Self-pay | Admitting: Family Medicine

## 2022-06-07 DIAGNOSIS — Z720 Tobacco use: Secondary | ICD-10-CM

## 2022-06-07 MED ORDER — VARENICLINE TARTRATE 1 MG PO TABS
1.0000 mg | ORAL_TABLET | Freq: Two times a day (BID) | ORAL | 2 refills | Status: AC
Start: 2022-06-07 — End: 2022-09-05

## 2022-06-07 NOTE — Progress Notes (Unsigned)
cha

## 2022-06-07 NOTE — Telephone Encounter (Signed)
The continuation pack is sent the [employer] [facility]

## 2022-06-07 NOTE — Telephone Encounter (Signed)
Spoke to pharmacist , asking for 2 prescriptions of starter pack and continuation pack be sent separate aside from the the starter packs, please advice?

## 2022-06-07 NOTE — Telephone Encounter (Signed)
Patricia Bass called from [facility] [phone] they are not getting no response from nurse or by fax, this was sent in last week. Chantix   Patricia Bass asked for a return call needs to explain other information.

## 2022-06-07 NOTE — Telephone Encounter (Signed)
[facility] Bumps [phone] calling back last week and still trying to get

## 2022-06-08 ENCOUNTER — Telehealth: Payer: Self-pay | Admitting: Family Medicine

## 2022-06-08 ENCOUNTER — Telehealth: Payer: Self-pay

## 2022-06-08 DIAGNOSIS — Z0000 Encounter for general adult medical examination without abnormal findings: Secondary | ICD-10-CM

## 2022-06-08 NOTE — Telephone Encounter (Signed)
Called patient to determine interest in participating in health coaching program for smoking cessation per referral from Dr. Jenene Slicker. Patient did not answer. Left message for patient to return call.    Renaee Munda, MS, ERHD, [facility]  Care Guide, Health & Wellness Coach [address], [address] [place] [address] Telephone: [phone] Email: [email or web address]

## 2022-06-08 NOTE — Telephone Encounter (Signed)
Refills sent to pharmacy. 

## 2022-06-08 NOTE — Telephone Encounter (Signed)
Patient called  left a voicemail 06.03.2024 at 4:59 pm said provider has not yet sent in her prescription and needs refill varenicline

## 2022-06-11 ENCOUNTER — Telehealth: Payer: Self-pay | Admitting: *Deleted

## 2022-06-11 MED ORDER — METOPROLOL TARTRATE 25 MG PO TABS: 37.5000 mg | ORAL_TABLET | Freq: Two times a day (BID) | ORAL | 1 refills | Status: DC

## 2022-06-11 NOTE — Telephone Encounter (Signed)
Patient informed and verbalized understanding of plan. Copy sent to PCP 

## 2022-06-11 NOTE — Telephone Encounter (Signed)
-----   Message from Marjo Bicker, MD sent at 06/06/2022  7:09 PM EDT ----- Symptoms correlated with sinus tachycardia. No atrial arrhythmias. Increase Metoprolol tartarate dose from 25 mg to 37.5 mg BID. If BP<90 mm Hg SBP, need to skip Metoprolol.

## 2022-06-17 ENCOUNTER — Encounter (HOSPITAL_COMMUNITY): Payer: Self-pay | Admitting: Emergency Medicine

## 2022-06-17 ENCOUNTER — Emergency Department (HOSPITAL_COMMUNITY)
Admission: EM | Admit: 2022-06-17 | Discharge: 2022-06-17 | Disposition: A | Discharge status: Home / Self Care | Payer: Medicare Other | Attending: Emergency Medicine | Admitting: Emergency Medicine

## 2022-06-17 ENCOUNTER — Telehealth: Payer: Self-pay | Admitting: Internal Medicine

## 2022-06-17 ENCOUNTER — Other Ambulatory Visit: Payer: Self-pay

## 2022-06-17 DIAGNOSIS — M7989 Other specified soft tissue disorders: Secondary | ICD-10-CM | POA: Diagnosis present

## 2022-06-17 DIAGNOSIS — L03119 Cellulitis of unspecified part of limb: Secondary | ICD-10-CM | POA: Diagnosis not present

## 2022-06-17 DIAGNOSIS — R200 Anesthesia of skin: Secondary | ICD-10-CM

## 2022-06-17 DIAGNOSIS — R202 Paresthesia of skin: Secondary | ICD-10-CM

## 2022-06-17 MED ORDER — CEPHALEXIN 500 MG PO CAPS: 500.0000 mg | ORAL_CAPSULE | Freq: Three times a day (TID) | ORAL | 0 refills | Status: DC

## 2022-06-17 MED ORDER — CEPHALEXIN 500 MG PO CAPS
500.0000 mg | ORAL_CAPSULE | Freq: Once | ORAL | Status: AC
  Administered 2022-06-17: 500 mg via ORAL
  Filled 2022-06-17: qty 1

## 2022-06-17 NOTE — Telephone Encounter (Signed)
Patient notified and verbalized understanding.  She will go to [place].   

## 2022-06-17 NOTE — ED Provider Notes (Signed)
[facility] AT [facility] Provider Note   CSN: [id number] Arrival date & time: 06/17/22  1809     History  Chief Complaint  Patient presents with   Leg Swelling    Patricia Bass is a 62 y.o. female.  He is here with a complaint of swelling of her right leg that is been going on for about a week.  No pain.  She said she was recently admitted for heart failure and had swelling on both of her legs and they went down but now the right leg is swelling again.  No chest pain or shortness of breath.  She also has noticed some numbness on the outside of her left lower leg it has been there for a week or more.  No trauma.  She has chronic back issues.  She has not seen her primary care doctor for this but called them and they said she needed to come get an ultrasound.  No fevers or chills no known insect bites or wounds.  The history is provided by the patient.  Leg Pain Location:  Leg Time since incident:  1 week Injury: no   Leg location:  R lower leg Pain details:    Severity:  No pain   Onset quality:  Gradual   Progression:  Worsening Chronicity:  Recurrent Relieved by:  None tried Worsened by:  Nothing Ineffective treatments:  None tried Associated symptoms: back pain (chronic) and numbness (left lateral lower leg)   Associated symptoms: no fever        Home Medications Prior to Admission medications   Medication Sig Start Date End Date Taking? Authorizing Provider  albuterol (PROVENTIL HFA;VENTOLIN HFA) 108 (90 BASE) MCG/ACT inhaler Inhale 2 puffs into the lungs every 6 (six) hours as needed for wheezing or shortness of breath.     [provider]  aspirin EC 81 MG tablet Take 1 tablet (81 mg total) by mouth daily. Swallow whole. 04/05/22 07/04/22  Osvaldo Shipper, MD  atorvastatin (LIPITOR) 40 MG tablet Take 40 mg by mouth every evening.    [provider]  Cholecalciferol (VITAMIN D-3 PO) Take 1 capsule by mouth daily.     [provider]  cyanocobalamin 1000 MCG tablet Take 1 tablet (1,000 mcg total) by mouth daily. 04/05/22   Osvaldo Shipper, MD  Ferrous Sulfate (IRON PO) Take 1 tablet by mouth daily.    [provider]  furosemide (LASIX) 40 MG tablet Take 1 tablet (40 mg total) by mouth 2 (two) times daily. 04/05/22   Osvaldo Shipper, MD  ibuprofen (ADVIL,MOTRIN) 800 MG tablet Take 800 mg by mouth 2 (two) times daily as needed for headache or moderate pain.    [provider]  ipratropium-albuterol (DUONEB) 0.5-2.5 (3) MG/3ML SOLN Take 3 mLs by nebulization every 6 (six) hours as needed. 04/05/22   Osvaldo Shipper, MD  levothyroxine (SYNTHROID) 88 MCG tablet Take 88 mcg by mouth daily.    [provider]  lisinopril (ZESTRIL) 2.5 MG tablet Take 1 tablet (2.5 mg total) by mouth daily. 05/11/22 08/09/22  Mallipeddi, Vishnu P, MD  metFORMIN (GLUCOPHAGE) 1000 MG tablet Take 1,000 mg by mouth in the morning and at bedtime.    [provider]  methocarbamol (ROBAXIN) 750 MG tablet Take 750 mg by mouth every 8 (eight) hours as needed for muscle spasms.    [provider]  metoprolol tartrate (LOPRESSOR) 25 MG tablet Take 1.5 tablets (37.5 mg total) by mouth 2 (  two) times daily. 06/11/22   Mallipeddi, Vishnu P, MD  omeprazole (PRILOSEC) 40 MG capsule Take 1 capsule (40 mg total) by mouth daily. Patient taking differently: Take 40 mg by mouth 2 (two) times daily as needed (acid reflux). 09/05/14   [name], MD  oxyCODONE-acetaminophen (PERCOCET) 10-325 MG tablet Take 1 tablet by mouth every 6 (six) hours as needed for pain.    [provider]  potassium chloride SA (KLOR-CON M) 20 MEQ tablet Take 1 tablet (20 mEq total) by mouth daily. 04/05/22   Osvaldo Shipper, MD  pregabalin (LYRICA) 300 MG capsule Take 1 capsule (300 mg total) by mouth 2 (two) times daily. 09/05/14   [name], MD  rOPINIRole (REQUIP) 3 MG tablet Take 3 mg by mouth 3 (three) times daily.     [provider]  valACYclovir (VALTREX) 500 MG tablet Take 500 mg by mouth 2 (two) times daily as needed (cold sores).    [name], MD  varenicline (CHANTIX CONTINUING MONTH PAK) 1 MG tablet Take 1 tablet (1 mg total) by mouth 2 (two) times daily. 06/07/22 09/05/22  Gilmore Laroche, FNP  Varenicline Tartrate, Starter, (CHANTIX STARTING MONTH PAK) 0.5 MG X 11 & 1 MG X 42 TBPK Take as directed on package 06/02/22   Gilmore Laroche, FNP      Allergies    Duragesic disc transdermal system [fentanyl]    Review of Systems   Review of Systems  Constitutional:  Negative for fever.  Respiratory:  Negative for shortness of breath.   Cardiovascular:  Positive for leg swelling. Negative for chest pain.  Musculoskeletal:  Positive for back pain (chronic).    Physical Exam Updated Vital Signs BP 98/66   Pulse 95   Temp 98.6 F (37 C)   Resp 18   Ht 5\' 1"  (1.549 m)   Wt 60.3 kg   SpO2 90% Comment: History COPD-wears O2 prn. no sob at present.  BMI 25.13 kg/m  Physical Exam Vitals and nursing note reviewed.  Constitutional:      General: She is not in acute distress.    Appearance: Normal appearance. She is well-developed.  HENT:     Head: Normocephalic and atraumatic.  Eyes:     Conjunctiva/sclera: Conjunctivae normal.  Cardiovascular:     Rate and Rhythm: Normal rate and regular rhythm.     Heart sounds: No murmur heard. Pulmonary:     Effort: Pulmonary effort is normal. No respiratory distress.     Breath sounds: Normal breath sounds. No stridor. No wheezing.  Abdominal:     Palpations: Abdomen is soft.     Tenderness: There is no abdominal tenderness. There is no guarding or rebound.  Musculoskeletal:        General: No tenderness or deformity. Normal range of motion.     Cervical back: Neck supple.  Skin:    General: Skin is warm and dry.  Neurological:     General: No focal deficit present.     Mental Status: She is alert.     GCS: GCS eye subscore is 4. GCS  verbal subscore is 5. GCS motor subscore is 6.     Sensory: Sensory deficit (subjective decresed sensation lateral side of lower left leg) present.     Motor: No weakness.     ED Results / Procedures / Treatments   Labs (all labs ordered are listed, but only abnormal results are displayed) Labs Reviewed - No data to display  EKG None  Radiology No  results found.  Procedures Procedures    Medications Ordered in ED Medications - No data to display  ED Course/ Medical Decision Making/ A&P                             Medical Decision Making Risk Prescription drug management.   This patient complains of right lower leg swelling left lateral leg numbness; this involves an extensive number of treatment Options and is a complaint that carries with it a high risk of complications and morbidity. The differential includes cellulitis, DVT, peripheral edema, radiculopathy, vasculitis I ordered imaging studies which included right lower extremity duplex and this will be done tomorrow as study is unavailable at this time.   Previous records obtained and reviewed in epic including recent PCP notes Social determinants considered, ongoing tobacco use depression Critical Interventions: None  After the interventions stated above, I reevaluated the patient and found patient to be well-appearing in no distress Admission and further testing considered, no indications for admission or further workup at this time.  Patient will return tomorrow to get duplex.  Will cover with antibiotics for possible cellulitis.         Final Clinical Impression(s) / ED Diagnoses Final diagnoses:  Leg swelling  Cellulitis of lower extremity, unspecified laterality  Numbness and tingling of left leg    Rx / DC Orders ED Discharge Orders          Ordered    cephALEXin (KEFLEX) 500 MG capsule  3 times daily        06/17/22 2033    US Venous Img Lower Unilateral Right  Status:  Canceled         06/17/22 2033              Terrilee Files, MD 06/18/22 [phone]

## 2022-06-17 NOTE — Discharge Instructions (Signed)
You are seen in the emergency department for redness and swelling of your right lower leg and some numbness of your left lower leg.  We are putting you on an antibiotic for possible early skin infection.  I have also ordered an ultrasound for tomorrow to evaluate you for a blood clot.  Please call the ultrasound department in the morning to schedule this.  If you are positive for a blood clot you will need to be started on medication.  Please follow-up with your primary care doctor for reevaluation and further workup of the numbness on your left lower leg.

## 2022-06-17 NOTE — Telephone Encounter (Signed)
Pt c/o swelling: STAT is pt has developed SOB within 24 hours  If swelling, where is the swelling located? Patient states her right leg is starting to swell again.   How much weight have you gained and in what time span? Patient states she hasn't gained any weight.   Have you gained 3 pounds in a day or 5 pounds in a week? no  Do you have a log of your daily weights (if so, list)? no  Are you currently taking a fluid pill? Yes   Are you currently SOB? no  Have you traveled recently? Patient states [place] the mountains [place].

## 2022-06-17 NOTE — Telephone Encounter (Signed)
Spoke with patient - stated that she is having swelling in her right leg x 3 days now.  No c/o chest pain, weight gain, or dizziness.  Does c/o slight SOB w/ exertion.  Normal stress test 06/03/22 & Cardiac MRI is scheduled for 06/21/2022.  States she did go to the mountains last week and really did not eat bad but did have to sit in car for long period of time on the drive.  Will fwd to provider for advice.

## 2022-06-17 NOTE — ED Triage Notes (Signed)
Pt sent to ED by PCP for right leg swelling x 1 week and left leg numbness x 1 month.

## 2022-06-18 ENCOUNTER — Other Ambulatory Visit (HOSPITAL_COMMUNITY): Payer: Self-pay | Admitting: Emergency Medicine

## 2022-06-18 ENCOUNTER — Telehealth (HOSPITAL_COMMUNITY): Payer: Self-pay | Admitting: Emergency Medicine

## 2022-06-18 ENCOUNTER — Ambulatory Visit (HOSPITAL_COMMUNITY)
Admission: RE | Admit: 2022-06-18 | Discharge: 2022-06-18 | Disposition: A | Discharge status: Home / Self Care | Payer: Medicare Other | Source: Ambulatory Visit | Attending: Emergency Medicine | Admitting: Emergency Medicine

## 2022-06-18 DIAGNOSIS — L03119 Cellulitis of unspecified part of limb: Secondary | ICD-10-CM | POA: Insufficient documentation

## 2022-06-18 DIAGNOSIS — M7989 Other specified soft tissue disorders: Secondary | ICD-10-CM

## 2022-06-18 DIAGNOSIS — R200 Anesthesia of skin: Secondary | ICD-10-CM

## 2022-06-18 NOTE — Telephone Encounter (Signed)
Attempted to call patient regarding upcoming cardiac MR appointment. Left message on voicemail with name and callback number Jalesia Loudenslager RN Navigator Cardiac Imaging [facility] [phone] Office [phone] Cell  

## 2022-06-21 ENCOUNTER — Ambulatory Visit (HOSPITAL_COMMUNITY): Admission: RE | Admit: 2022-06-21 | Payer: Medicare Other | Source: Ambulatory Visit

## 2022-06-27 NOTE — Progress Notes (Unsigned)
Established Patient Office Visit  Subjective:  Patient ID: Patricia Bass, female    DOB: 11/04/1960  Age: 62 y.o. MRN: [id number]  CC: No chief complaint on file.   HPI Patricia Bass is a 62 y.o. female with past medical history of essential hypertension, pericardial effusion, acute on chronic diastolic CHF presents for  ED f/u.   Leg swelling:  she was seen in the ED on 06/17/2022 with c/o of right leg swelling for a week and left lateral leg numbness. Duplex u/s was negative for DVT in the right leg. The patient was treated for cellulitis of lower extremity of the lower leg with keflex and discharged. Since discharged, the patient denies symptoms of leg pain, swelling   Past Medical History:  Diagnosis Date   Anemia    Anxiety    Arthritis    Bulging disc    Carpal tunnel syndrome    Cervicalgia    Chronic airway obstruction (HCC)    Chronic back pain    COPD (chronic obstructive pulmonary disease) (HCC)    Coronary artery disease    Diabetes mellitus (HCC) 01/11/2012   GERD (gastroesophageal reflux disease)    Headache(784.0)    Hypothyroid    Lumbago    Migraine    Myalgia and myositis, unspecified    Neuropathy    Pneumonia    Restless leg syndrome    Shortness of breath    Sleep apnea    uses O2 at home. uses it at home all times and at night at 2 liters.    Past Surgical History:  Procedure Laterality Date   BACK SURGERY     BIOPSY  12/06/2016   Procedure: BIOPSY;  Surgeon: Corbin Ade, MD;  Location: [facility];  Service: Endoscopy;;  gastric    BLADDER SUSPENSION     CARDIAC CATHETERIZATION     CHOLECYSTECTOMY     COLONOSCOPY WITH PROPOFOL N/A 12/06/2016   Procedure: COLONOSCOPY WITH PROPOFOL  Formed stool could not do;  Surgeon: Corbin Ade, MD;  Location: [facility];  Service: Endoscopy;  Laterality: N/A;  10:00am   ESOPHAGOGASTRODUODENOSCOPY (EGD) WITH PROPOFOL N/A 12/06/2016   Procedure: ESOPHAGOGASTRODUODENOSCOPY (EGD) WITH  PROPOFOL;  Surgeon: Corbin Ade, MD;  Location: [facility];  Service: Endoscopy;  Laterality: N/A;   MALONEY DILATION N/A 12/06/2016   Procedure: Elease Hashimoto DILATION;  Surgeon: Corbin Ade, MD;  Location: [facility];  Service: Endoscopy;  Laterality: N/A;   TUBAL LIGATION     ULNAR NERVE TRANSPOSITION  04/01/2011   Procedure: ULNAR NERVE DECOMPRESSION/TRANSPOSITION;  Surgeon: Dominica Severin, MD;  Location: [facility] OR;  Service: Orthopedics;  Laterality: Left;  ULNAR NERVE RELEASE WITH ANTERIOR TRANS POSITION REPAIR RECONSTRUCTION AS NECESSARY    Family History  Problem Relation Age of Onset   Hypertension Mother    Asthma Father    Heart attack Father    Colon cancer Neg Hx    Celiac disease Neg Hx     Social History   Socioeconomic History   Marital status: Married    Spouse name: Not on file   Number of children: Not on file   Years of education: Not on file   Highest education level: Not on file  Occupational History   Not on file  Tobacco Use   Smoking status: Every Day    Packs/day: 1.00    Years: 20.00    Additional pack years: 0.00    Total pack years: 20.00  Types: Cigarettes, E-cigarettes   Smokeless tobacco: Never  Vaping Use   Vaping Use: Never used  Substance and Sexual Activity   Alcohol use: No   Drug use: Yes    Types: Marijuana, Oxycodone    Comment: last 2020   Sexual activity: Never  Other Topics Concern   Not on file  Social History Narrative   Not on file   Social Determinants of Health   Financial Resource Strain: Not on file  Food Insecurity: No Food Insecurity (04/01/2022)   Hunger Vital Sign    Worried About Running Out of Food in the Last Year: Never true    Ran Out of Food in the Last Year: Never true  Transportation Needs: No Transportation Needs (04/01/2022)   PRAPARE - Administrator, Civil Service (Medical): No    Lack of Transportation (Non-Medical): No  Physical Activity: Not on file  Stress: Not on file   Social Connections: Not on file  Intimate Partner Violence: Not At Risk (04/01/2022)   Humiliation, Afraid, Rape, and Kick questionnaire    Fear of Current or Ex-Partner: No    Emotionally Abused: No    Physically Abused: No    Sexually Abused: No    Outpatient Medications Prior to Visit  Medication Sig Dispense Refill   albuterol (PROVENTIL HFA;VENTOLIN HFA) 108 (90 BASE) MCG/ACT inhaler Inhale 2 puffs into the lungs every 6 (six) hours as needed for wheezing or shortness of breath.      aspirin EC 81 MG tablet Take 1 tablet (81 mg total) by mouth daily. Swallow whole. 30 tablet 2   atorvastatin (LIPITOR) 40 MG tablet Take 40 mg by mouth every evening.     cephALEXin (KEFLEX) 500 MG capsule Take 1 capsule (500 mg total) by mouth 3 (three) times daily. 20 capsule 0   Cholecalciferol (VITAMIN D-3 PO) Take 1 capsule by mouth daily.     cyanocobalamin 1000 MCG tablet Take 1 tablet (1,000 mcg total) by mouth daily. 30 tablet 2   Ferrous Sulfate (IRON PO) Take 1 tablet by mouth daily.     furosemide (LASIX) 40 MG tablet Take 1 tablet (40 mg total) by mouth 2 (two) times daily. 60 tablet 2   ibuprofen (ADVIL,MOTRIN) 800 MG tablet Take 800 mg by mouth 2 (two) times daily as needed for headache or moderate pain.     ipratropium-albuterol (DUONEB) 0.5-2.5 (3) MG/3ML SOLN Take 3 mLs by nebulization every 6 (six) hours as needed. 360 mL 1   levothyroxine (SYNTHROID) 88 MCG tablet Take 88 mcg by mouth daily.     lisinopril (ZESTRIL) 2.5 MG tablet Take 1 tablet (2.5 mg total) by mouth daily. 90 tablet 3   metFORMIN (GLUCOPHAGE) 1000 MG tablet Take 1,000 mg by mouth in the morning and at bedtime.     methocarbamol (ROBAXIN) 750 MG tablet Take 750 mg by mouth every 8 (eight) hours as needed for muscle spasms.     metoprolol tartrate (LOPRESSOR) 25 MG tablet Take 1.5 tablets (37.5 mg total) by mouth 2 (two) times daily. 270 tablet 1   omeprazole (PRILOSEC) 40 MG capsule Take 1 capsule (40 mg total) by  mouth daily. (Patient taking differently: Take 40 mg by mouth 2 (two) times daily as needed (acid reflux).) 30 capsule 0   oxyCODONE-acetaminophen (PERCOCET) 10-325 MG tablet Take 1 tablet by mouth every 6 (six) hours as needed for pain.     potassium chloride SA (KLOR-CON M) 20 MEQ tablet Take 1 tablet (  20 mEq total) by mouth daily. 30 tablet 1   pregabalin (LYRICA) 300 MG capsule Take 1 capsule (300 mg total) by mouth 2 (two) times daily. 60 capsule 0   rOPINIRole (REQUIP) 3 MG tablet Take 3 mg by mouth 3 (three) times daily.     valACYclovir (VALTREX) 500 MG tablet Take 500 mg by mouth 2 (two) times daily as needed (cold sores).     varenicline (CHANTIX CONTINUING MONTH PAK) 1 MG tablet Take 1 tablet (1 mg total) by mouth 2 (two) times daily. 60 tablet 2   Varenicline Tartrate, Starter, (CHANTIX STARTING MONTH PAK) 0.5 MG X 11 & 1 MG X 42 TBPK Take as directed on package 53 each 0   No facility-administered medications prior to visit.    Allergies  Allergen Reactions   Duragesic Disc Transdermal System [Fentanyl] Nausea And Vomiting    ROS Review of Systems    Objective:    Physical Exam  There were no vitals taken for this visit. Wt Readings from Last 3 Encounters:  06/17/22 133 lb (60.3 kg)  05/26/22 135 lb 1.3 oz (61.3 kg)  05/11/22 133 lb (60.3 kg)    Lab Results  Component Value Date   TSH 4.880 (H) 05/26/2022   Lab Results  Component Value Date   WBC 11.8 (H) 05/26/2022   HGB 13.5 05/26/2022   HCT 43.4 05/26/2022   MCV 86 05/26/2022   PLT 192 05/26/2022   Lab Results  Component Value Date   NA 134 05/26/2022   K 4.7 05/26/2022   CO2 29 05/26/2022   GLUCOSE 78 05/26/2022   BUN 25 05/26/2022   CREATININE 1.06 (H) 05/26/2022   BILITOT 0.3 05/26/2022   ALKPHOS 94 05/26/2022   AST 21 05/26/2022   ALT 12 05/26/2022   PROT 7.8 05/26/2022   ALBUMIN 4.7 05/26/2022   CALCIUM 10.1 05/26/2022   ANIONGAP 15 04/05/2022   EGFR 59 (L) 05/26/2022   Lab Results   Component Value Date   CHOL 147 05/26/2022   Lab Results  Component Value Date   HDL 41 05/26/2022   Lab Results  Component Value Date   LDLCALC 71 05/26/2022   Lab Results  Component Value Date   TRIG 210 (H) 05/26/2022   Lab Results  Component Value Date   CHOLHDL 3.6 05/26/2022   Lab Results  Component Value Date   HGBA1C 6.0 (H) 05/26/2022      Assessment & Plan:  There are no diagnoses linked to this encounter.  Follow-up: No follow-ups on file.   Gilmore Laroche, FNP

## 2022-06-28 ENCOUNTER — Encounter: Payer: Self-pay | Admitting: Family Medicine

## 2022-06-28 ENCOUNTER — Ambulatory Visit (INDEPENDENT_AMBULATORY_CARE_PROVIDER_SITE_OTHER): Payer: Medicare Other | Admitting: Family Medicine

## 2022-06-28 VITALS — BP 95/68 | HR 101 | Ht 61.0 in | Wt 137.1 lb

## 2022-06-28 DIAGNOSIS — I5033 Acute on chronic diastolic (congestive) heart failure: Secondary | ICD-10-CM

## 2022-06-28 DIAGNOSIS — B009 Herpesviral infection, unspecified: Secondary | ICD-10-CM

## 2022-06-28 DIAGNOSIS — G629 Polyneuropathy, unspecified: Secondary | ICD-10-CM | POA: Diagnosis not present

## 2022-06-28 DIAGNOSIS — G8929 Other chronic pain: Secondary | ICD-10-CM

## 2022-06-28 DIAGNOSIS — J449 Chronic obstructive pulmonary disease, unspecified: Secondary | ICD-10-CM

## 2022-06-28 DIAGNOSIS — G2581 Restless legs syndrome: Secondary | ICD-10-CM

## 2022-06-28 DIAGNOSIS — E876 Hypokalemia: Secondary | ICD-10-CM

## 2022-06-28 DIAGNOSIS — E538 Deficiency of other specified B group vitamins: Secondary | ICD-10-CM

## 2022-06-28 DIAGNOSIS — M545 Low back pain, unspecified: Secondary | ICD-10-CM | POA: Diagnosis not present

## 2022-06-28 MED ORDER — CYANOCOBALAMIN 1000 MCG PO TABS: 1000.0000 ug | ORAL_TABLET | Freq: Every day | ORAL | 2 refills | Status: DC

## 2022-06-28 MED ORDER — ALBUTEROL SULFATE HFA 108 (90 BASE) MCG/ACT IN AERS
2.0000 | INHALATION_SPRAY | Freq: Four times a day (QID) | RESPIRATORY_TRACT | 1 refills | Status: AC | PRN
Start: 2022-06-28 — End: ?

## 2022-06-28 MED ORDER — POTASSIUM CHLORIDE CRYS ER 20 MEQ PO TBCR
20.0000 meq | EXTENDED_RELEASE_TABLET | Freq: Every day | ORAL | 1 refills | Status: DC
Start: 2022-06-28 — End: 2022-07-15

## 2022-06-28 MED ORDER — ROPINIROLE HCL 3 MG PO TABS
3.0000 mg | ORAL_TABLET | Freq: Two times a day (BID) | ORAL | 2 refills | Status: DC
Start: 2022-06-28 — End: 2022-07-15

## 2022-06-28 MED ORDER — VALACYCLOVIR HCL 500 MG PO TABS: 500.0000 mg | ORAL_TABLET | Freq: Two times a day (BID) | ORAL | 2 refills | Status: DC | PRN

## 2022-06-28 MED ORDER — METHOCARBAMOL 750 MG PO TABS
750.0000 mg | ORAL_TABLET | Freq: Three times a day (TID) | ORAL | 2 refills | Status: DC | PRN
Start: 2022-06-28 — End: 2022-09-30

## 2022-06-28 NOTE — Assessment & Plan Note (Signed)
Encouraged to continue current treatment regimen with Lyrica 300 mg twice daily, vitamin B12 at 1000 mcg daily, Robaxin-750 milligrams every 8 hours, and requip 3 mg twice daily Will continue to monitor No recent injury or trauma noted No red flag symptoms noted 

## 2022-06-28 NOTE — Assessment & Plan Note (Deleted)
Encouraged to continue current treatment regimen with Lyrica 300 mg twice daily, vitamin B12 at 1000 mcg daily, Robaxin-750 milligrams every 8 hours, and requip 3 mg twice daily Will continue to monitor No recent injury or trauma noted No red flag symptoms noted

## 2022-06-28 NOTE — Patient Instructions (Signed)
I appreciate the opportunity to provide care to you today!    Follow up:  3 months   Please pick your refills at the pharmacy   Please continue to a heart-healthy diet and increase your physical activities. Try to exercise for at least five days a week.      It was a pleasure to see you and I look forward to continuing to work together on your health and well-being. Please do not hesitate to call the office if you need care or have questions about your care.   Have a wonderful day and week. With Gratitude, Gilmore Laroche MSN, FNP-BC

## 2022-06-30 ENCOUNTER — Telehealth (HOSPITAL_COMMUNITY): Payer: Self-pay | Admitting: Emergency Medicine

## 2022-06-30 ENCOUNTER — Encounter (HOSPITAL_COMMUNITY): Payer: Self-pay

## 2022-06-30 NOTE — Telephone Encounter (Signed)
Unable to leave vm Cary Lothrop RN Navigator Cardiac Imaging [facility] [phone] Office  [phone] Cell  

## 2022-07-01 ENCOUNTER — Ambulatory Visit (HOSPITAL_COMMUNITY): Admission: RE | Admit: 2022-07-01 | Payer: Medicare Other | Source: Ambulatory Visit

## 2022-07-15 ENCOUNTER — Other Ambulatory Visit: Payer: Self-pay | Admitting: Family Medicine

## 2022-07-15 ENCOUNTER — Other Ambulatory Visit: Payer: Self-pay

## 2022-07-15 ENCOUNTER — Telehealth: Payer: Self-pay | Admitting: Family Medicine

## 2022-07-15 DIAGNOSIS — G2581 Restless legs syndrome: Secondary | ICD-10-CM

## 2022-07-15 DIAGNOSIS — E876 Hypokalemia: Secondary | ICD-10-CM

## 2022-07-15 MED ORDER — POTASSIUM CHLORIDE CRYS ER 20 MEQ PO TBCR
20.0000 meq | EXTENDED_RELEASE_TABLET | Freq: Every day | ORAL | 1 refills | Status: DC
Start: 2022-07-15 — End: 2022-08-19

## 2022-07-15 MED ORDER — ROPINIROLE HCL 3 MG PO TABS
3.0000 mg | ORAL_TABLET | Freq: Two times a day (BID) | ORAL | 2 refills | Status: DC
Start: 2022-07-15 — End: 2022-09-17

## 2022-07-15 NOTE — Telephone Encounter (Signed)
Please advice on lyrica?

## 2022-07-15 NOTE — Telephone Encounter (Signed)
Prescription Request  07/15/2022  LOV: 06/28/2022  What is the name of the medication or equipment? pregabalin (LYRICA) 300 MG capsule [[id number]]   rOPINIRole (REQUIP) 3 MG tablet [[id number]]    potassium chloride SA (KLOR-CON M) 20 MEQ tablet [[id number]]   Have you contacted your pharmacy to request a refill? Yes   Which pharmacy would you like this sent to? [facility] [address] [place]    Patient notified that their request is being sent to the clinical staff for review and that they should receive a response within 2 business days.   Please advise at Mobile [phone] (mobile)

## 2022-07-15 NOTE — Telephone Encounter (Signed)
Kindly encourage to follow up with [facility] as lyrica has been filled by Dr. Randalyn Rhea.

## 2022-07-16 ENCOUNTER — Other Ambulatory Visit: Payer: Self-pay | Admitting: Family Medicine

## 2022-07-16 DIAGNOSIS — G629 Polyneuropathy, unspecified: Secondary | ICD-10-CM

## 2022-07-16 MED ORDER — PREGABALIN 300 MG PO CAPS: 300.0000 mg | ORAL_CAPSULE | Freq: Two times a day (BID) | ORAL | 0 refills | Status: DC

## 2022-07-16 NOTE — Telephone Encounter (Signed)
Rx sent 

## 2022-07-16 NOTE — Telephone Encounter (Signed)
Pt states she is no longer seeing [facility] and is now established with you.

## 2022-07-20 ENCOUNTER — Telehealth: Payer: Self-pay | Admitting: Family Medicine

## 2022-07-20 ENCOUNTER — Other Ambulatory Visit: Payer: Self-pay | Admitting: Family Medicine

## 2022-07-20 DIAGNOSIS — G8929 Other chronic pain: Secondary | ICD-10-CM

## 2022-07-20 MED ORDER — OXYCODONE-ACETAMINOPHEN 10-325 MG PO TABS
1.0000 | ORAL_TABLET | Freq: Four times a day (QID) | ORAL | 0 refills | Status: DC | PRN
Start: 2022-07-20 — End: 2022-08-03

## 2022-07-20 NOTE — Telephone Encounter (Signed)
Patient called missed her pain management appointment in [place] could not find the building, patient is asking will Patricia Bass fill her oxyCODONE-acetaminophen (PERCOCET) 10-325 MG tablet [[id number]]   [facility] [address] [place]

## 2022-07-20 NOTE — Telephone Encounter (Signed)
 Rx sent 

## 2022-07-21 NOTE — Telephone Encounter (Signed)
Pt called in med needs pre auth   See previous tele message

## 2022-07-21 NOTE — Telephone Encounter (Signed)
Need to know which med, lmtrc.

## 2022-07-30 ENCOUNTER — Telehealth: Payer: Self-pay | Admitting: Family Medicine

## 2022-07-30 ENCOUNTER — Other Ambulatory Visit: Payer: Self-pay | Admitting: Family Medicine

## 2022-07-30 DIAGNOSIS — M545 Low back pain, unspecified: Secondary | ICD-10-CM

## 2022-07-30 NOTE — Telephone Encounter (Signed)
Pt asking for refill last filled on 07/20/2022, please advice?

## 2022-07-30 NOTE — Telephone Encounter (Signed)
Prescription Request  07/30/2022  LOV: 06/28/2022  What is the name of the medication or equipment? oxyCODONE-acetaminophen (PERCOCET) 10-325 MG tablet   Have you contacted your pharmacy to request a refill? No   Which pharmacy would you like this sent to?   [facility] - [place], [place] - [address]  [place] [address] Phone: [phone] Fax: [phone] Patient notified that their request is being sent to the clinical staff for review and that they should receive a response within 2 business days.   Please advise at Mobile [phone] (mobile)

## 2022-08-02 ENCOUNTER — Telehealth: Payer: Self-pay | Admitting: Family Medicine

## 2022-08-02 NOTE — Telephone Encounter (Signed)
Patient called said she need refill on her pain medication not able to get it filled by pain management doctor she was referred to 08.19.2024.    oxyCODONE-acetaminophen (PERCOCET) 10-325 MG tablet [[id number]]   Pharmacy: [facility] [address] [place]

## 2022-08-03 ENCOUNTER — Telehealth: Payer: Self-pay | Admitting: Family Medicine

## 2022-08-03 ENCOUNTER — Other Ambulatory Visit: Payer: Self-pay | Admitting: Family Medicine

## 2022-08-03 DIAGNOSIS — M545 Low back pain, unspecified: Secondary | ICD-10-CM

## 2022-08-03 MED ORDER — OXYCODONE-ACETAMINOPHEN 10-325 MG PO TABS
1.0000 | ORAL_TABLET | Freq: Four times a day (QID) | ORAL | 0 refills | Status: DC | PRN
Start: 2022-08-03 — End: 2022-08-11

## 2022-08-03 NOTE — Telephone Encounter (Signed)
Patient LVM returning call  ?

## 2022-08-03 NOTE — Telephone Encounter (Signed)
Refill sent. Kindly inquire why Pain Management will not fill her prescription. If needed, another referral can be placed.

## 2022-08-03 NOTE — Telephone Encounter (Signed)
Tried calling pt to let her know and ask about pain managament no vm set up.

## 2022-08-04 NOTE — Telephone Encounter (Signed)
Pt reports her pain management appointment is [place] on 08/23/2022, pt states she is used to having a lengthy prescription if she can continue to have this filled until she sees pain management.

## 2022-08-04 NOTE — Telephone Encounter (Signed)
Second attempt lvm

## 2022-08-04 NOTE — Telephone Encounter (Signed)
Spoke with pt, please see other TE regarding pain management

## 2022-08-04 NOTE — Telephone Encounter (Signed)
Patient returning call to nurse  

## 2022-08-05 ENCOUNTER — Ambulatory Visit (HOSPITAL_COMMUNITY): Payer: Medicare Other

## 2022-08-10 NOTE — Telephone Encounter (Signed)
Patient called left a voicemail asking please will Patricia Bass call in her some Oxycodone until her pain management appointment 08.19.2024. To pharmacy: [facility]

## 2022-08-11 ENCOUNTER — Other Ambulatory Visit: Payer: Self-pay | Admitting: Family Medicine

## 2022-08-11 DIAGNOSIS — M545 Low back pain, unspecified: Secondary | ICD-10-CM

## 2022-08-11 MED ORDER — OXYCODONE-ACETAMINOPHEN 10-325 MG PO TABS
1.0000 | ORAL_TABLET | Freq: Four times a day (QID) | ORAL | 0 refills | Status: DC | PRN
Start: 2022-08-11 — End: 2023-01-17

## 2022-08-11 NOTE — Telephone Encounter (Signed)
Rx sent 

## 2022-08-16 ENCOUNTER — Other Ambulatory Visit: Payer: Self-pay | Admitting: Family Medicine

## 2022-08-16 ENCOUNTER — Telehealth: Payer: Self-pay | Admitting: Family Medicine

## 2022-08-16 DIAGNOSIS — G629 Polyneuropathy, unspecified: Secondary | ICD-10-CM

## 2022-08-16 NOTE — Telephone Encounter (Signed)
Prescription Request  08/16/2022  LOV: 06/28/2022  What is the name of the medication or equipment? pregabalin (LYRICA) 300 MG capsule [[id number]]    potassium chloride SA (KLOR-CON M) 20 MEQ tablet [[id number]]   lisinopril (ZESTRIL) 2.5 MG tablet [[id number]]    oxyCODONE-acetaminophen (PERCOCET) 10-325 MG tablet [[id number]] NEEDS THIS UNTIL GETS TO THE DR ON 08.19.2024 PAIN MANAGEMENT PROVIDER APPT.  Have you contacted your pharmacy to request a refill? Yes   Which pharmacy would you like this sent to?  [facility] [address] [place]   Patient notified that their request is being sent to the clinical staff for review and that they should receive a response within 2 business days.   Please advise at Mobile [phone] (mobile)

## 2022-08-19 ENCOUNTER — Other Ambulatory Visit: Payer: Self-pay | Admitting: Family Medicine

## 2022-08-19 DIAGNOSIS — I10 Essential (primary) hypertension: Secondary | ICD-10-CM

## 2022-08-19 DIAGNOSIS — E876 Hypokalemia: Secondary | ICD-10-CM

## 2022-08-19 MED ORDER — LISINOPRIL 2.5 MG PO TABS
2.5000 mg | ORAL_TABLET | Freq: Every day | ORAL | 3 refills | Status: DC
Start: 2022-08-19 — End: 2022-09-23

## 2022-08-19 MED ORDER — POTASSIUM CHLORIDE CRYS ER 20 MEQ PO TBCR
20.0000 meq | EXTENDED_RELEASE_TABLET | Freq: Every day | ORAL | 1 refills | Status: DC
Start: 2022-08-19 — End: 2022-09-17

## 2022-08-19 NOTE — Telephone Encounter (Signed)
Lyrica and lisinopril has been sent to the pharmacy

## 2022-08-23 DIAGNOSIS — G894 Chronic pain syndrome: Secondary | ICD-10-CM | POA: Diagnosis not present

## 2022-08-23 DIAGNOSIS — Z79891 Long term (current) use of opiate analgesic: Secondary | ICD-10-CM | POA: Diagnosis not present

## 2022-08-23 DIAGNOSIS — Z79899 Other long term (current) drug therapy: Secondary | ICD-10-CM | POA: Diagnosis not present

## 2022-08-28 DIAGNOSIS — E119 Type 2 diabetes mellitus without complications: Secondary | ICD-10-CM | POA: Diagnosis not present

## 2022-08-28 DIAGNOSIS — E876 Hypokalemia: Secondary | ICD-10-CM | POA: Diagnosis not present

## 2022-08-28 DIAGNOSIS — I110 Hypertensive heart disease with heart failure: Secondary | ICD-10-CM | POA: Diagnosis not present

## 2022-08-28 DIAGNOSIS — Z008 Encounter for other general examination: Secondary | ICD-10-CM | POA: Diagnosis not present

## 2022-08-28 DIAGNOSIS — J9610 Chronic respiratory failure, unspecified whether with hypoxia or hypercapnia: Secondary | ICD-10-CM | POA: Diagnosis not present

## 2022-08-28 DIAGNOSIS — E039 Hypothyroidism, unspecified: Secondary | ICD-10-CM | POA: Diagnosis not present

## 2022-08-28 DIAGNOSIS — M545 Low back pain, unspecified: Secondary | ICD-10-CM | POA: Diagnosis not present

## 2022-08-28 DIAGNOSIS — Z7984 Long term (current) use of oral hypoglycemic drugs: Secondary | ICD-10-CM | POA: Diagnosis not present

## 2022-08-28 DIAGNOSIS — I509 Heart failure, unspecified: Secondary | ICD-10-CM | POA: Diagnosis not present

## 2022-08-28 DIAGNOSIS — E785 Hyperlipidemia, unspecified: Secondary | ICD-10-CM | POA: Diagnosis not present

## 2022-08-28 DIAGNOSIS — K219 Gastro-esophageal reflux disease without esophagitis: Secondary | ICD-10-CM | POA: Diagnosis not present

## 2022-08-28 DIAGNOSIS — M1990 Unspecified osteoarthritis, unspecified site: Secondary | ICD-10-CM | POA: Diagnosis not present

## 2022-08-28 DIAGNOSIS — Z8249 Family history of ischemic heart disease and other diseases of the circulatory system: Secondary | ICD-10-CM | POA: Diagnosis not present

## 2022-09-17 ENCOUNTER — Telehealth: Payer: Self-pay | Admitting: Family Medicine

## 2022-09-17 ENCOUNTER — Other Ambulatory Visit: Payer: Self-pay

## 2022-09-17 DIAGNOSIS — E876 Hypokalemia: Secondary | ICD-10-CM

## 2022-09-17 DIAGNOSIS — G2581 Restless legs syndrome: Secondary | ICD-10-CM

## 2022-09-17 MED ORDER — POTASSIUM CHLORIDE CRYS ER 20 MEQ PO TBCR
20.0000 meq | EXTENDED_RELEASE_TABLET | Freq: Every day | ORAL | 1 refills | Status: DC
Start: 2022-09-17 — End: 2022-09-30

## 2022-09-17 MED ORDER — ROPINIROLE HCL 3 MG PO TABS
3.0000 mg | ORAL_TABLET | Freq: Two times a day (BID) | ORAL | 2 refills | Status: DC
Start: 2022-09-17 — End: 2022-09-30

## 2022-09-17 NOTE — Telephone Encounter (Signed)
Patient needing a refill on rOPINIRole (REQUIP) 3 MG tablet [[id number]] and potassium chloride SA (KLOR-CON M) 20 MEQ tablet [[id number]] Please advise [facility] [address] Thank you

## 2022-09-17 NOTE — Telephone Encounter (Signed)
Refills sent

## 2022-09-23 ENCOUNTER — Other Ambulatory Visit: Payer: Self-pay | Admitting: *Deleted

## 2022-09-23 DIAGNOSIS — I10 Essential (primary) hypertension: Secondary | ICD-10-CM

## 2022-09-23 MED ORDER — METOPROLOL TARTRATE 37.5 MG PO TABS: 37.5000 mg | ORAL_TABLET | Freq: Two times a day (BID) | ORAL | 1 refills | Status: DC

## 2022-09-23 MED ORDER — LISINOPRIL 2.5 MG PO TABS: 2.5000 mg | ORAL_TABLET | Freq: Every day | ORAL | 1 refills | Status: DC

## 2022-09-28 ENCOUNTER — Telehealth: Payer: Self-pay | Admitting: Family Medicine

## 2022-09-28 ENCOUNTER — Ambulatory Visit: Payer: Medicare Other | Admitting: Family Medicine

## 2022-09-28 NOTE — Telephone Encounter (Signed)
Prescription Request  09/28/2022  LOV: 06/28/2022  What is the name of the medication or equipment? pregabalin (LYRICA) 300 MG capsule [[id number]]    Have you contacted your pharmacy to request a refill? No   Which pharmacy would you like this sent to?   [id number] - [place], [place] - [address] AT [address]  [place] [address] Phone: [phone] Fax: [phone]     Patient notified that their request is being sent to the clinical staff for review and that they should receive a response within 2 business days.   Please advise at Mobile [phone] (mobile)

## 2022-09-29 ENCOUNTER — Other Ambulatory Visit: Payer: Self-pay | Admitting: Family Medicine

## 2022-09-29 DIAGNOSIS — G629 Polyneuropathy, unspecified: Secondary | ICD-10-CM

## 2022-09-29 MED ORDER — PREGABALIN 300 MG PO CAPS
300.0000 mg | ORAL_CAPSULE | Freq: Two times a day (BID) | ORAL | 0 refills | Status: DC
Start: 2022-09-29 — End: 2022-11-25

## 2022-09-29 NOTE — Telephone Encounter (Signed)
Rx sent 

## 2022-09-30 ENCOUNTER — Other Ambulatory Visit: Payer: Self-pay | Admitting: Family Medicine

## 2022-09-30 DIAGNOSIS — G629 Polyneuropathy, unspecified: Secondary | ICD-10-CM

## 2022-09-30 DIAGNOSIS — Z716 Tobacco abuse counseling: Secondary | ICD-10-CM

## 2022-09-30 DIAGNOSIS — G2581 Restless legs syndrome: Secondary | ICD-10-CM

## 2022-09-30 DIAGNOSIS — E876 Hypokalemia: Secondary | ICD-10-CM

## 2022-09-30 DIAGNOSIS — M545 Low back pain, unspecified: Secondary | ICD-10-CM

## 2022-09-30 DIAGNOSIS — B009 Herpesviral infection, unspecified: Secondary | ICD-10-CM

## 2022-09-30 MED ORDER — POTASSIUM CHLORIDE CRYS ER 20 MEQ PO TBCR
20.0000 meq | EXTENDED_RELEASE_TABLET | Freq: Every day | ORAL | 1 refills | Status: DC
Start: 2022-09-30 — End: 2022-10-21

## 2022-09-30 MED ORDER — VARENICLINE TARTRATE (STARTER) 0.5 MG X 11 & 1 MG X 42 PO TBPK: ORAL_TABLET | ORAL | 0 refills | Status: DC

## 2022-09-30 MED ORDER — VALACYCLOVIR HCL 500 MG PO TABS
500.0000 mg | ORAL_TABLET | Freq: Two times a day (BID) | ORAL | 2 refills | Status: DC | PRN
Start: 2022-09-30 — End: 2023-01-17

## 2022-09-30 MED ORDER — ROPINIROLE HCL 3 MG PO TABS
3.0000 mg | ORAL_TABLET | Freq: Two times a day (BID) | ORAL | 2 refills | Status: DC
Start: 2022-09-30 — End: 2023-02-14

## 2022-09-30 MED ORDER — METHOCARBAMOL 750 MG PO TABS: 750.0000 mg | ORAL_TABLET | Freq: Three times a day (TID) | ORAL | 2 refills | Status: DC | PRN

## 2022-10-01 ENCOUNTER — Other Ambulatory Visit: Payer: Self-pay | Admitting: Family Medicine

## 2022-10-01 DIAGNOSIS — G629 Polyneuropathy, unspecified: Secondary | ICD-10-CM

## 2022-10-04 DIAGNOSIS — G894 Chronic pain syndrome: Secondary | ICD-10-CM | POA: Diagnosis not present

## 2022-10-04 DIAGNOSIS — M797 Fibromyalgia: Secondary | ICD-10-CM | POA: Diagnosis not present

## 2022-10-04 DIAGNOSIS — M25522 Pain in left elbow: Secondary | ICD-10-CM | POA: Diagnosis not present

## 2022-10-04 DIAGNOSIS — M4326 Fusion of spine, lumbar region: Secondary | ICD-10-CM | POA: Diagnosis not present

## 2022-10-04 DIAGNOSIS — Z79891 Long term (current) use of opiate analgesic: Secondary | ICD-10-CM | POA: Diagnosis not present

## 2022-10-04 DIAGNOSIS — M4322 Fusion of spine, cervical region: Secondary | ICD-10-CM | POA: Diagnosis not present

## 2022-10-04 DIAGNOSIS — M25561 Pain in right knee: Secondary | ICD-10-CM | POA: Diagnosis not present

## 2022-10-04 DIAGNOSIS — E1140 Type 2 diabetes mellitus with diabetic neuropathy, unspecified: Secondary | ICD-10-CM | POA: Diagnosis not present

## 2022-10-04 DIAGNOSIS — M25512 Pain in left shoulder: Secondary | ICD-10-CM | POA: Diagnosis not present

## 2022-10-06 ENCOUNTER — Ambulatory Visit (INDEPENDENT_AMBULATORY_CARE_PROVIDER_SITE_OTHER): Payer: Self-pay | Admitting: Internal Medicine

## 2022-10-06 ENCOUNTER — Encounter: Payer: Self-pay | Admitting: Internal Medicine

## 2022-10-06 VITALS — BP 139/83 | HR 137 | Ht 61.0 in | Wt 125.6 lb

## 2022-10-06 DIAGNOSIS — J111 Influenza due to unidentified influenza virus with other respiratory manifestations: Secondary | ICD-10-CM | POA: Diagnosis not present

## 2022-10-06 DIAGNOSIS — R112 Nausea with vomiting, unspecified: Secondary | ICD-10-CM

## 2022-10-06 DIAGNOSIS — J449 Chronic obstructive pulmonary disease, unspecified: Secondary | ICD-10-CM

## 2022-10-06 MED ORDER — OSELTAMIVIR PHOSPHATE 75 MG PO CAPS
75.0000 mg | ORAL_CAPSULE | Freq: Two times a day (BID) | ORAL | 0 refills | Status: DC
Start: 2022-10-06 — End: 2023-01-17

## 2022-10-06 MED ORDER — ONDANSETRON HCL 4 MG PO TABS: 4.0000 mg | ORAL_TABLET | Freq: Three times a day (TID) | ORAL | 0 refills | Status: DC | PRN

## 2022-10-06 NOTE — Progress Notes (Signed)
Acute Office Visit  Subjective:    Patient ID: Patricia Bass, female    DOB: [birth year], 62 y.o.   MRN: [id number]  Chief Complaint  Patient presents with   flu like symptoms    Patient was exposed to flu, for three days patient has had cough , vomiting , diarrhea, nauseous , body aches with chills     HPI Patient is in today for flu like symptoms for the last last 3 days.  She reports nasal congestion, postnasal drip, cough. She also has chills, myalgias in addition to nausea/vomiting and diarrhea.  Her brother had flu in the last week and she was with him at that time.  She has history of COPD and is a current smoker.  Denies any recent worsening of dyspnea or wheezing.  She uses albuterol inhaler as needed for dyspnea or wheezing.  Past Medical History:  Diagnosis Date   Anemia    Anxiety    Arthritis    Bulging disc    Carpal tunnel syndrome    Cervicalgia    Chronic airway obstruction (HCC)    Chronic back pain    COPD (chronic obstructive pulmonary disease) (HCC)    Coronary artery disease    Diabetes mellitus (HCC) 01/11/2012   GERD (gastroesophageal reflux disease)    Headache(784.0)    Hypothyroid    Lumbago    Migraine    Myalgia and myositis, unspecified    Neuropathy    Pneumonia    Restless leg syndrome    Shortness of breath    Sleep apnea    uses O2 at home. uses it at home all times and at night at 2 liters.    Past Surgical History:  Procedure Laterality Date   BACK SURGERY     BIOPSY  12/06/2016   Procedure: BIOPSY;  Surgeon: Corbin Ade, MD;  [facility];  Service: Endoscopy;;  gastric    BLADDER SUSPENSION     CARDIAC CATHETERIZATION     CHOLECYSTECTOMY     COLONOSCOPY WITH PROPOFOL N/A 12/06/2016   Procedure: COLONOSCOPY WITH PROPOFOL  Formed stool could not do;  Surgeon: Corbin Ade, MD;  [facility];  Service: Endoscopy;  Laterality: N/A;  10:00am   ESOPHAGOGASTRODUODENOSCOPY (EGD) WITH PROPOFOL N/A  12/06/2016   Procedure: ESOPHAGOGASTRODUODENOSCOPY (EGD) WITH PROPOFOL;  Surgeon: Corbin Ade, MD;  [facility];  Service: Endoscopy;  Laterality: N/A;   MALONEY DILATION N/A 12/06/2016   Procedure: Elease Hashimoto DILATION;  Surgeon: Corbin Ade, MD;  [facility];  Service: Endoscopy;  Laterality: N/A;   TUBAL LIGATION     ULNAR NERVE TRANSPOSITION  04/01/2011   Procedure: ULNAR NERVE DECOMPRESSION/TRANSPOSITION;  Surgeon: Dominica Severin, MD;  Location: [facility];  Service: Orthopedics;  Laterality: Left;  ULNAR NERVE RELEASE WITH ANTERIOR TRANS POSITION REPAIR RECONSTRUCTION AS NECESSARY    Family History  Problem Relation Age of Onset   Hypertension Mother    Asthma Father    Heart attack Father    Colon cancer Neg Hx    Celiac disease Neg Hx     Social History   Socioeconomic History   Marital status: Married    Spouse name: Not on file   Number of children: Not on file   Years of education: Not on file   Highest education level: Not on file  Occupational History   Not on file  Tobacco Use   Smoking status: Every Day  Current packs/day: 1.00    Average packs/day: 1 pack/day for 20.0 years (20.0 ttl pk-yrs)    Types: Cigarettes, E-cigarettes   Smokeless tobacco: Never  Vaping Use   Vaping status: Never Used  Substance and Sexual Activity   Alcohol use: No   Drug use: Yes    Types: Marijuana, Oxycodone    Comment: last 2020   Sexual activity: Never  Other Topics Concern   Not on file  Social History Narrative   Not on file   Social Determinants of Health   Financial Resource Strain: Not on file  Food Insecurity: No Food Insecurity (04/01/2022)   Hunger Vital Sign    Worried About Running Out of Food in the Last Year: Never true    Ran Out of Food in the Last Year: Never true  Transportation Needs: No Transportation Needs (04/01/2022)   PRAPARE - Administrator, Civil Service (Medical): No    Lack of Transportation  (Non-Medical): No  Physical Activity: Not on file  Stress: Not on file  Social Connections: Not on file  Intimate Partner Violence: Not At Risk (04/01/2022)   Humiliation, Afraid, Rape, and Kick questionnaire    Fear of Current or Ex-Partner: No    Emotionally Abused: No    Physically Abused: No    Sexually Abused: No    Outpatient Medications Prior to Visit  Medication Sig Dispense Refill   albuterol (VENTOLIN HFA) 108 (90 Base) MCG/ACT inhaler Inhale 2 puffs into the lungs every 6 (six) hours as needed for wheezing or shortness of breath. 18 g 1   atorvastatin (LIPITOR) 40 MG tablet Take 40 mg by mouth every evening.     Cholecalciferol (VITAMIN D-3 PO) Take 1 capsule by mouth daily.     cyanocobalamin 1000 MCG tablet Take 1 tablet (1,000 mcg total) by mouth daily. 30 tablet 2   Ferrous Sulfate (IRON PO) Take 1 tablet by mouth daily.     furosemide (LASIX) 40 MG tablet Take 1 tablet (40 mg total) by mouth 2 (two) times daily. 60 tablet 2   ibuprofen (ADVIL,MOTRIN) 800 MG tablet Take 800 mg by mouth 2 (two) times daily as needed for headache or moderate pain.     ipratropium-albuterol (DUONEB) 0.5-2.5 (3) MG/3ML SOLN Take 3 mLs by nebulization every 6 (six) hours as needed. 360 mL 1   levothyroxine (SYNTHROID) 88 MCG tablet Take 88 mcg by mouth daily.     lisinopril (ZESTRIL) 2.5 MG tablet Take 1 tablet (2.5 mg total) by mouth daily. 90 tablet 1   metFORMIN (GLUCOPHAGE) 1000 MG tablet Take 1,000 mg by mouth in the morning and at bedtime.     methocarbamol (ROBAXIN) 750 MG tablet Take 1 tablet (750 mg total) by mouth every 8 (eight) hours as needed for muscle spasms. 60 tablet 2   metoprolol tartrate 37.5 MG TABS Take 1 tablet (37.5 mg total) by mouth 2 (two) times daily. 180 tablet 1   omeprazole (PRILOSEC) 40 MG capsule Take 1 capsule (40 mg total) by mouth daily. (Patient taking differently: Take 40 mg by mouth 2 (two) times daily as needed (acid reflux).) 30 capsule 0    oxyCODONE-acetaminophen (PERCOCET) 10-325 MG tablet Take 1 tablet by mouth every 6 (six) hours as needed for pain. 15 tablet 0   potassium chloride SA (KLOR-CON M) 20 MEQ tablet Take 1 tablet (20 mEq total) by mouth daily. 30 tablet 1   pregabalin (LYRICA) 300 MG capsule Take 1 capsule (300 mg  total) by mouth 2 (two) times daily. 60 capsule 0   rOPINIRole (REQUIP) 3 MG tablet Take 1 tablet (3 mg total) by mouth in the morning and at bedtime. 80 tablet 2   valACYclovir (VALTREX) 500 MG tablet Take 1 tablet (500 mg total) by mouth 2 (two) times daily as needed (cold sores). 90 tablet 2   Varenicline Tartrate, Starter, (CHANTIX STARTING MONTH PAK) 0.5 MG X 11 & 1 MG X 42 TBPK Take as directed on package 53 each 0   cephALEXin (KEFLEX) 500 MG capsule Take 1 capsule (500 mg total) by mouth 3 (three) times daily. 20 capsule 0   No facility-administered medications prior to visit.    Allergies  Allergen Reactions   Duragesic Disc Transdermal System [Fentanyl] Nausea And Vomiting    Review of Systems  Constitutional:  Positive for chills and fatigue. Negative for fever.  HENT:  Positive for congestion, postnasal drip, sinus pressure and sore throat.   Eyes:  Negative for pain and discharge.  Respiratory:  Positive for cough and shortness of breath.   Cardiovascular:  Negative for chest pain and palpitations.  Gastrointestinal:  Positive for diarrhea, nausea and vomiting.  Endocrine: Negative for polydipsia and polyuria.  Genitourinary:  Negative for dysuria and hematuria.  Musculoskeletal:  Negative for neck pain and neck stiffness.  Skin:  Negative for rash.  Neurological:  Positive for weakness. Negative for dizziness.  Psychiatric/Behavioral:  Negative for agitation and behavioral problems.        Objective:    Physical Exam Constitutional:      General: She is not in acute distress.    Appearance: She is not diaphoretic.  HENT:     Nose: Congestion present.     Mouth/Throat:      Pharynx: Posterior oropharyngeal erythema present.  Eyes:     General: No scleral icterus.    Extraocular Movements: Extraocular movements intact.  Cardiovascular:     Rate and Rhythm: Regular rhythm. Tachycardia present.     Heart sounds: Normal heart sounds. No murmur heard. Pulmonary:     Breath sounds: Normal breath sounds. No wheezing or rales.  Skin:    General: Skin is warm.     Findings: No rash.  Neurological:     General: No focal deficit present.     Mental Status: She is alert and oriented to person, place, and time.  Psychiatric:        Mood and Affect: Mood normal.        Behavior: Behavior normal.     BP 139/83 (BP Location: Left Arm, Patient Position: Sitting, Cuff Size: Normal)   Pulse (!) 137   Ht 5\' 1"  (1.549 m)   Wt 125 lb 9.6 oz (57 kg)   SpO2 92%   BMI 23.73 kg/m  Wt Readings from Last 3 Encounters:  10/06/22 125 lb 9.6 oz (57 kg)  06/28/22 137 lb 1.9 oz (62.2 kg)  06/17/22 133 lb (60.3 kg)        Assessment & Plan:   Problem List Items Addressed This Visit       Respiratory   COPD (chronic obstructive pulmonary disease) (HCC)    Currently uses albuterol as needed for dyspnea or wheezing Needs to avoid smoking, especially while having URTI      Influenza - Primary    Considering her flulike symptoms X 3 days and exposure to flu positive person, started empiric Tamiflu Check influenza + COVID + RSV Zofran as needed for nausea/vomiting Maintain  adequate hydration      Relevant Medications   oseltamivir (TAMIFLU) 75 MG capsule   Other Relevant Orders   COVID-19, Flu A+B and RSV   Other Visit Diagnoses     Nausea and vomiting, unspecified vomiting type       Relevant Medications   ondansetron (ZOFRAN) 4 MG tablet        Meds ordered this encounter  Medications   oseltamivir (TAMIFLU) 75 MG capsule    Sig: Take 1 capsule (75 mg total) by mouth 2 (two) times daily.    Dispense:  10 capsule    Refill:  0   ondansetron (ZOFRAN)  4 MG tablet    Sig: Take 1 tablet (4 mg total) by mouth every 8 (eight) hours as needed for nausea or vomiting.    Dispense:  20 tablet    Refill:  0     Leanette Eutsler Concha Se, MD

## 2022-10-06 NOTE — Patient Instructions (Addendum)
Please start taking Tamiflu as prescribed.  Please maintain adequate hydration by taking at least 64 ounces of fluid in a day.  Please take Zofran as needed for nausea/vomiting.

## 2022-10-06 NOTE — Assessment & Plan Note (Signed)
Currently uses albuterol as needed for dyspnea or wheezing Needs to avoid smoking, especially while having URTI

## 2022-10-06 NOTE — Assessment & Plan Note (Signed)
Considering her flulike symptoms X 3 days and exposure to flu positive person, started empiric Tamiflu Check influenza + COVID + RSV Zofran as needed for nausea/vomiting Maintain adequate hydration

## 2022-10-08 ENCOUNTER — Telehealth: Payer: Self-pay | Admitting: Family Medicine

## 2022-10-08 ENCOUNTER — Other Ambulatory Visit: Payer: Self-pay

## 2022-10-08 LAB — COVID-19, FLU A+B AND RSV
Influenza A, NAA: NOT DETECTED
Influenza B, NAA: NOT DETECTED
RSV, NAA: NOT DETECTED
SARS-CoV-2, NAA: NOT DETECTED

## 2022-10-08 MED ORDER — LEVOTHYROXINE SODIUM 88 MCG PO TABS: 88.0000 ug | ORAL_TABLET | Freq: Every day | ORAL | 2 refills | Status: DC

## 2022-10-08 MED ORDER — METFORMIN HCL 1000 MG PO TABS: 1000.0000 mg | ORAL_TABLET | Freq: Two times a day (BID) | ORAL | 2 refills | Status: DC

## 2022-10-08 NOTE — Telephone Encounter (Signed)
[facility]  pregabalin (LYRICA) 300 MG capsule [[id number]]   levothyroxine (SYNTHROID) 88 MCG tablet [[id number]]   oxyCODONE-acetaminophen (PERCOCET) 10-325 MG tablet [[id number]]   metFORMIN (GLUCOPHAGE) 1000 MG tablet [[id number]]   [facility]

## 2022-10-08 NOTE — Telephone Encounter (Signed)
Levothyroxine and metformin sent to pharmacy, please advice on pain medications?

## 2022-10-12 ENCOUNTER — Other Ambulatory Visit: Payer: Self-pay | Admitting: Family Medicine

## 2022-10-12 DIAGNOSIS — G629 Polyneuropathy, unspecified: Secondary | ICD-10-CM

## 2022-10-20 DIAGNOSIS — E119 Type 2 diabetes mellitus without complications: Secondary | ICD-10-CM | POA: Diagnosis not present

## 2022-10-21 ENCOUNTER — Other Ambulatory Visit: Payer: Self-pay

## 2022-10-21 ENCOUNTER — Telehealth: Payer: Self-pay | Admitting: Family Medicine

## 2022-10-21 DIAGNOSIS — E876 Hypokalemia: Secondary | ICD-10-CM

## 2022-10-21 MED ORDER — POTASSIUM CHLORIDE CRYS ER 20 MEQ PO TBCR: 20.0000 meq | EXTENDED_RELEASE_TABLET | Freq: Every day | ORAL | 1 refills | Status: DC

## 2022-10-21 NOTE — Telephone Encounter (Signed)
Refills sent to pharmacy. 

## 2022-10-21 NOTE — Telephone Encounter (Signed)
Patricia Bass [facility] need clarify a dose  Varenicline Tartrate, Starter, (CHANTIX STARTING MONTH PAK) 0.5 MG X 11 & 1 MG X 42 TBPK   Call back # [phone]

## 2022-10-21 NOTE — Telephone Encounter (Signed)
Prescription Request  10/21/2022  LOV: 06/28/2022  What is the name of the medication or equipment? potassium chloride SA (KLOR-CON M) 20 MEQ tablet [[id number]]   Have you contacted your pharmacy to request a refill? Yes   Which pharmacy would you like this sent to?   [id number] - [place], [place] - [address] [place] [address] Phone: [phone] Fax: [phone]     Patient notified that their request is being sent to the clinical staff for review and that they should receive a response within 2 business days.   Please advise at [employer] [phone]

## 2022-11-01 DIAGNOSIS — Z01818 Encounter for other preprocedural examination: Secondary | ICD-10-CM | POA: Diagnosis not present

## 2022-11-01 DIAGNOSIS — M797 Fibromyalgia: Secondary | ICD-10-CM | POA: Diagnosis not present

## 2022-11-01 DIAGNOSIS — M4326 Fusion of spine, lumbar region: Secondary | ICD-10-CM | POA: Diagnosis not present

## 2022-11-01 DIAGNOSIS — M25522 Pain in left elbow: Secondary | ICD-10-CM | POA: Diagnosis not present

## 2022-11-01 DIAGNOSIS — Z79891 Long term (current) use of opiate analgesic: Secondary | ICD-10-CM | POA: Diagnosis not present

## 2022-11-01 DIAGNOSIS — E1140 Type 2 diabetes mellitus with diabetic neuropathy, unspecified: Secondary | ICD-10-CM | POA: Diagnosis not present

## 2022-11-01 DIAGNOSIS — M25561 Pain in right knee: Secondary | ICD-10-CM | POA: Diagnosis not present

## 2022-11-01 DIAGNOSIS — G894 Chronic pain syndrome: Secondary | ICD-10-CM | POA: Diagnosis not present

## 2022-11-01 DIAGNOSIS — M25512 Pain in left shoulder: Secondary | ICD-10-CM | POA: Diagnosis not present

## 2022-11-02 ENCOUNTER — Other Ambulatory Visit (HOSPITAL_COMMUNITY): Payer: Self-pay | Admitting: Pain Medicine

## 2022-11-02 DIAGNOSIS — E1140 Type 2 diabetes mellitus with diabetic neuropathy, unspecified: Secondary | ICD-10-CM

## 2022-11-02 DIAGNOSIS — Z01818 Encounter for other preprocedural examination: Secondary | ICD-10-CM

## 2022-11-06 ENCOUNTER — Encounter (HOSPITAL_COMMUNITY): Payer: Self-pay

## 2022-11-06 ENCOUNTER — Ambulatory Visit (HOSPITAL_COMMUNITY): Payer: Medicare HMO

## 2022-11-08 IMAGING — MR MR CERVICAL SPINE W/O CM
4 of 5 series · 27 of 48 positions shown · non-contrast
Comparison: Comparison made with previous MRI from 02/08/2019.

CLINICAL DATA: Initial evaluation for left-sided neck pain with
radiation into the left shoulder, with left arm weakness, chronic.

EXAM:
MRI CERVICAL SPINE WITHOUT CONTRAST
TECHNIQUE: Multiplanar, multisequence MR imaging of the cervical spine was
performed. No intravenous contrast was administered.

[Series 2: T2 · sagittal · 3.0mm · 0.66mm/px · 6 of 12 slices shown (1 of 2)]
[im 1/12]
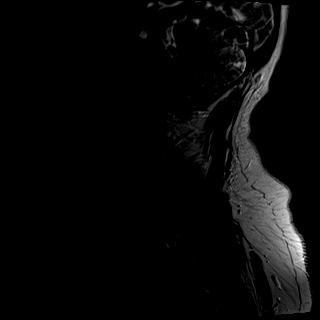
[im 3/12]
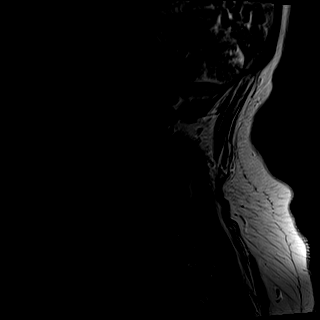
[im 5/12]
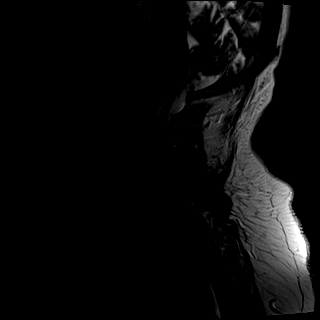
[im 7/12]
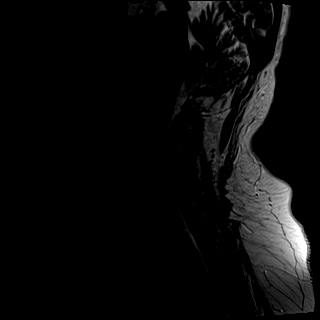
[im 9/12]
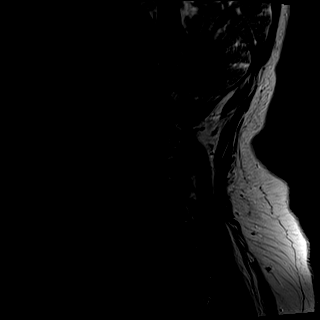
[im 12/12]
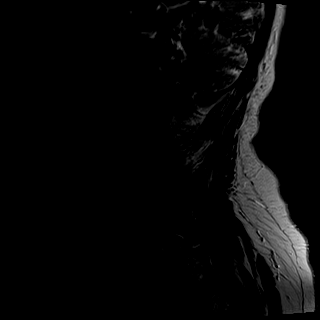

[Series 3: T1 · sagittal · 3.0mm · 0.41mm/px · 6 of 12 slices shown]
[im 1/12]
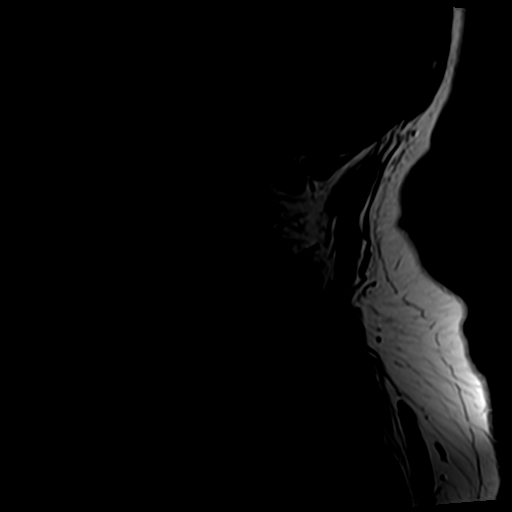
[im 3/12]
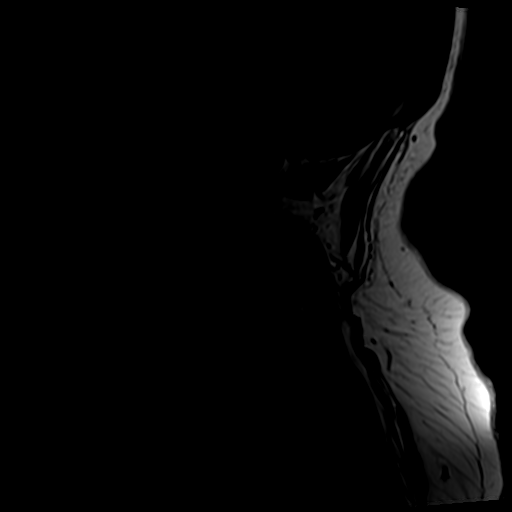
[im 5/12]
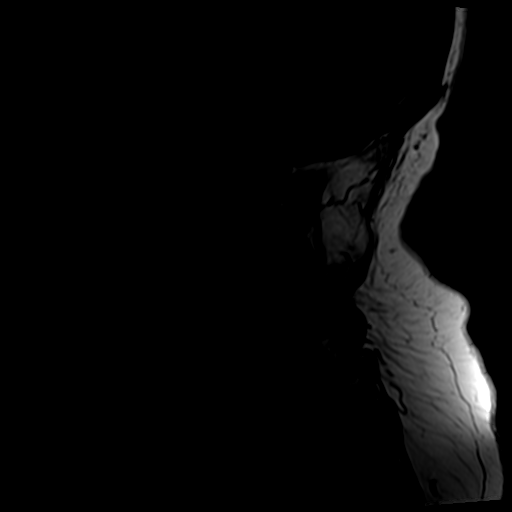
[im 7/12]
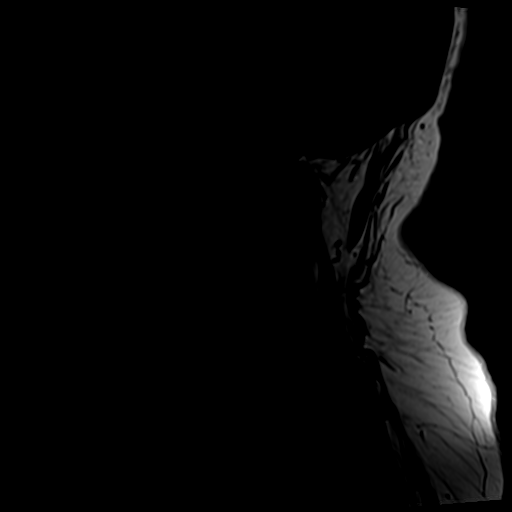
[im 9/12]
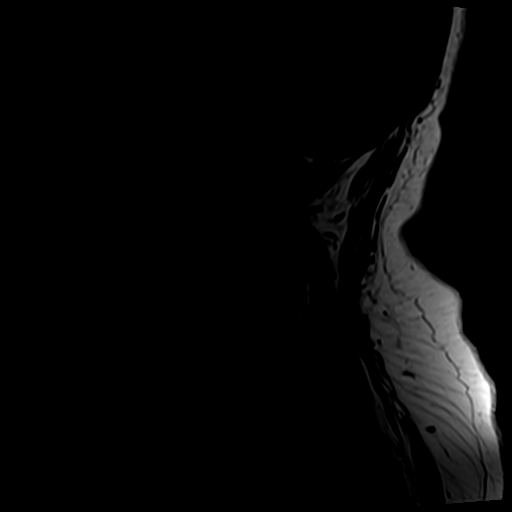
[im 12/12]
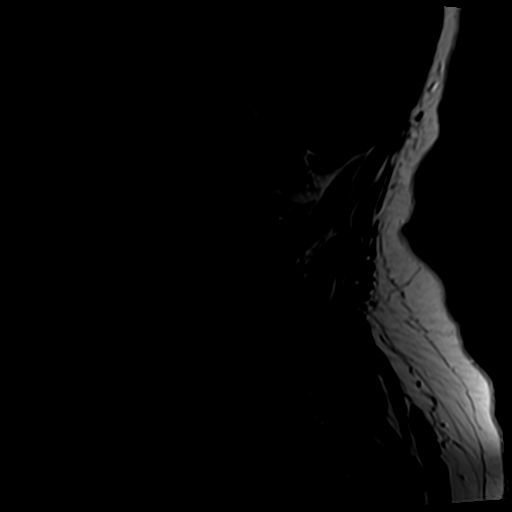

[Series 4: tir sag · sagittal · 3.0mm · 0.41mm/px · 6 of 12 slices shown]
[im 1/12]
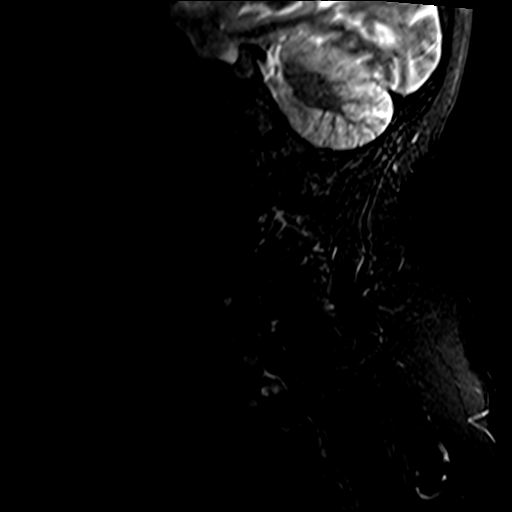
[im 3/12]
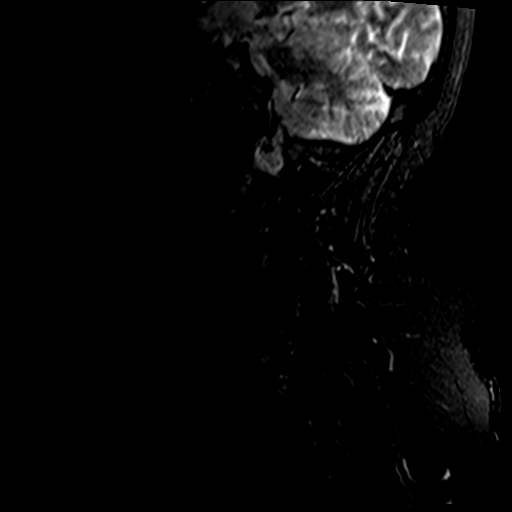
[im 5/12]
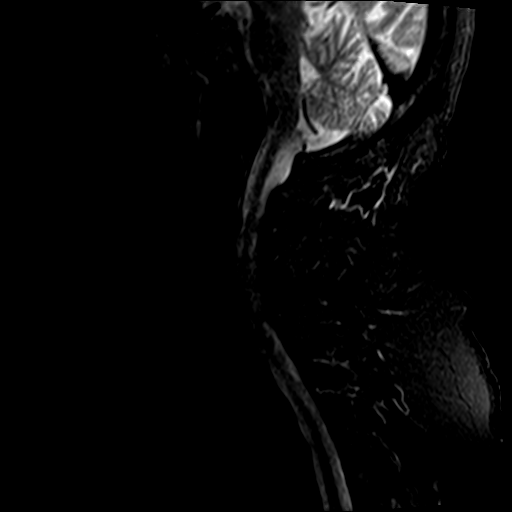
[im 7/12]
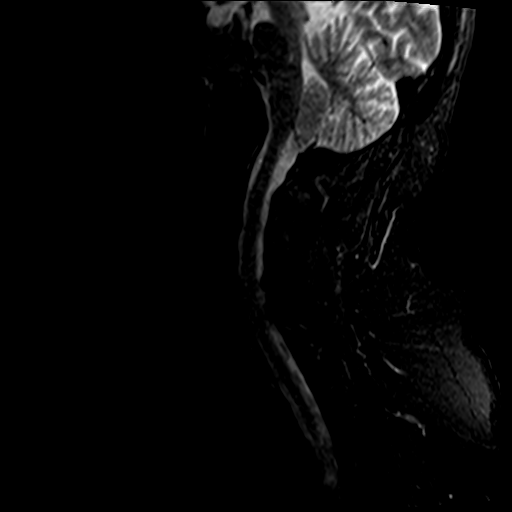
[im 9/12]
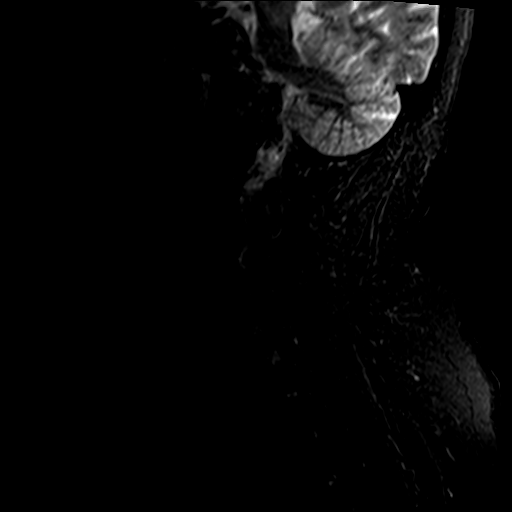
[im 12/12]
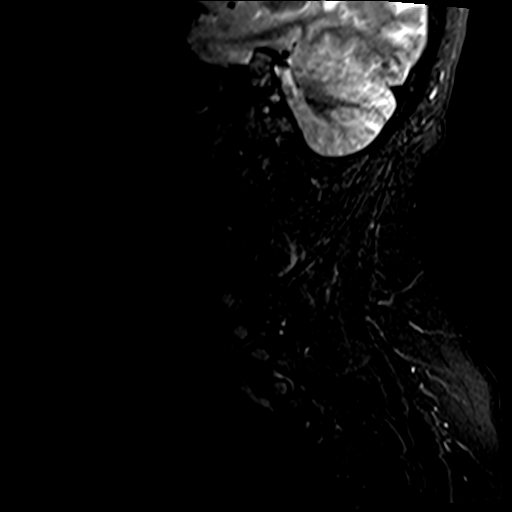

[Series 6: T2 · axial · 3.0mm · 0.70mm/px · z∈[-43,+57]mm · 9 of 28 slices shown (2 of 2)]
[im 1/28]
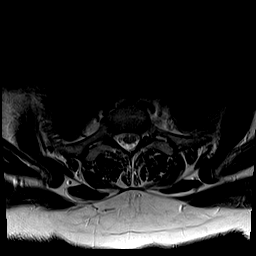
[im 4/28]
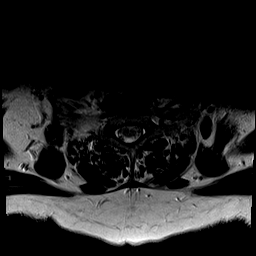
[im 8/28]
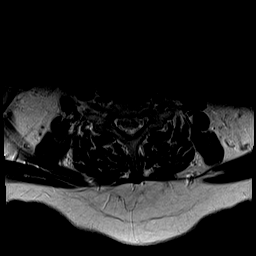
[im 12/28]
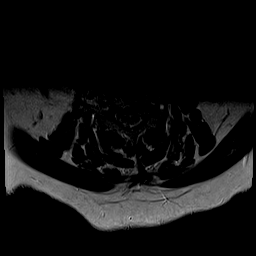
[im 14/28]
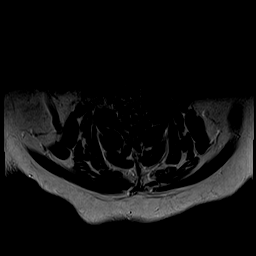
[im 16/28]
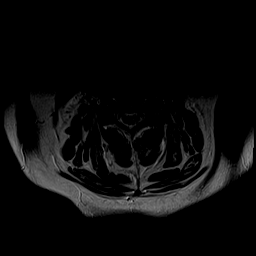
[im 20/28]
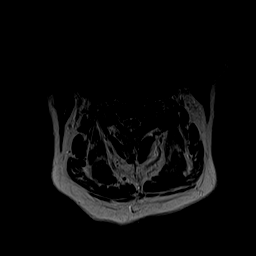
[im 24/28]
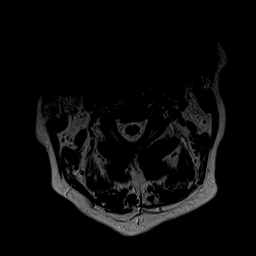
[im 28/28]
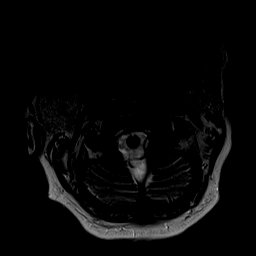

[27 of 48 positions shown; findings below may reference images not displayed]

FINDINGS: Alignment: Examination degraded by motion artifact.

Mild straightening of the normal cervical lordosis. No listhesis or
static subluxation.

Vertebrae: Vertebral body height well maintained without acute or
chronic fracture. Atlanto occipital assimilation noted, stable. Bone
marrow signal intensity within normal limits without discrete or
worrisome osseous lesions. No abnormal marrow edema.

Cord: Signal intensity within the cervical spinal cord is grossly
within normal limits allowing for motion artifact.

Posterior Fossa, vertebral arteries, paraspinal tissues: Note made
of a partially empty sella. Visualized brain and posterior fossa
otherwise unremarkable. Craniocervical junction widely patent and
normal. Paraspinous and prevertebral soft tissues demonstrate no
acute finding. 8 mm T2 hyperintense lesion noted within the
posterior right parotid gland, indeterminate, but similar to
previous, likely reflecting a benign finding.

Disc levels:

C2-C3: Minimal right eccentric disc bulge with uncovertebral
hypertrophy. Mild right-sided facet hypertrophy. No significant
spinal stenosis. Foramina remain patent.

C3-C4: Disc bulge with bilateral uncovertebral and facet
hypertrophy. Mild flattening of the ventral thecal sac without
significant spinal stenosis. Superimposed moderate left-sided facet
hypertrophy. Resultant moderate left with mild to moderate right C4
foraminal stenosis, stable.

C4-C5: Small central disc protrusion minimally indents the ventral
thecal sac, grossly similar to previous. Superimposed mild
uncovertebral and facet hypertrophy bilaterally. No significant
spinal stenosis. Mild to moderate bilateral foraminal narrowing
appears grossly stable.

C5-C6: Mild disc bulge with uncovertebral hypertrophy. Flattening of
the ventral thecal sac with resultant mild spinal stenosis, stable.
Mild to moderate bilateral C6 foraminal narrowing appears stable.

C6-C7: Left eccentric disc osteophyte complex with superimposed
small left subarticular disc protrusion again seen, relatively
similar to previous. Flattening of the ventral thecal sac, greater
on the left, with minimal cord flattening, also worse on the left.
Mild spinal stenosis. Moderate left with mild right C7 foraminal
narrowing. Appearance is stable.

C7-T1: Minimal disc bulge. Mild right-sided facet hypertrophy. No
canal or foraminal stenosis.

Visualized upper thoracic spine demonstrates no significant finding.
IMPRESSION: 1. No significant interval change in appearance of the cervical
spine as compared to 02/08/2019.
2. Left eccentric disc osteophyte complex at C6-7, potentially
affecting the ventral left C7 nerve root. Finding could contribute
to left-sided radicular symptoms.
3. Additional multilevel cervical spondylosis with resultant mild
spinal stenosis at C5-6, with mild to moderate bilateral foraminal
narrowing at C4 through C6, stable.

## 2022-11-11 ENCOUNTER — Other Ambulatory Visit: Payer: Self-pay | Admitting: Family Medicine

## 2022-11-11 ENCOUNTER — Ambulatory Visit: Payer: Medicare HMO | Attending: Internal Medicine | Admitting: Internal Medicine

## 2022-11-11 DIAGNOSIS — G8929 Other chronic pain: Secondary | ICD-10-CM

## 2022-11-11 NOTE — Progress Notes (Signed)
Erroneous encounter - please disregard.

## 2022-11-15 ENCOUNTER — Other Ambulatory Visit: Payer: Self-pay | Admitting: Family Medicine

## 2022-11-15 DIAGNOSIS — E876 Hypokalemia: Secondary | ICD-10-CM

## 2022-11-22 DIAGNOSIS — M47814 Spondylosis without myelopathy or radiculopathy, thoracic region: Secondary | ICD-10-CM | POA: Diagnosis not present

## 2022-11-22 DIAGNOSIS — M4804 Spinal stenosis, thoracic region: Secondary | ICD-10-CM | POA: Diagnosis not present

## 2022-11-22 DIAGNOSIS — Z01818 Encounter for other preprocedural examination: Secondary | ICD-10-CM | POA: Diagnosis not present

## 2022-11-24 ENCOUNTER — Other Ambulatory Visit: Payer: Self-pay | Admitting: Internal Medicine

## 2022-11-25 ENCOUNTER — Other Ambulatory Visit: Payer: Self-pay | Admitting: Family Medicine

## 2022-11-25 DIAGNOSIS — G629 Polyneuropathy, unspecified: Secondary | ICD-10-CM

## 2022-11-25 MED ORDER — PREGABALIN 300 MG PO CAPS: 300.0000 mg | ORAL_CAPSULE | Freq: Two times a day (BID) | ORAL | 0 refills | Status: AC

## 2022-11-25 NOTE — Telephone Encounter (Signed)
Copied from CRM [id number]. Topic: Clinical - Medication Refill >> Nov 25, 2022  9:18 AM Desma Mcgregor wrote: Most Recent Primary Care Visit:  Provider: Anabel Halon  Department: [facility]  Visit Type: OFFICE VISIT  Date: 10/06/2022  Medication: pregabalin (LYRICA) 300 MG capsule  Has the patient contacted their pharmacy? Yes (Agent: If no, request that the patient contact the pharmacy for the refill. If patient does not wish to contact the pharmacy document the reason why and proceed with request.) (Agent: If yes, when and what did the pharmacy advise?) Out of refills  Is this the correct pharmacy for this prescription? Yes If no, delete pharmacy and type the correct one.  This is the patient's preferred pharmacy:   [facility] - [place] - [address] [place] [address] Phone: [phone] Fax: [phone]  Has the prescription been filled recently? Yes  Is the patient out of the medication? No  Has the patient been seen for an appointment in the last year OR does the patient have an upcoming appointment?   Can we respond through MyChart?   Agent: Please be advised that Rx refills may take up to 3 business days. We ask that you follow-up with your pharmacy.

## 2022-11-29 DIAGNOSIS — E1140 Type 2 diabetes mellitus with diabetic neuropathy, unspecified: Secondary | ICD-10-CM | POA: Diagnosis not present

## 2022-11-29 DIAGNOSIS — G894 Chronic pain syndrome: Secondary | ICD-10-CM | POA: Diagnosis not present

## 2022-11-29 DIAGNOSIS — F1120 Opioid dependence, uncomplicated: Secondary | ICD-10-CM | POA: Diagnosis not present

## 2022-11-29 DIAGNOSIS — M4326 Fusion of spine, lumbar region: Secondary | ICD-10-CM | POA: Diagnosis not present

## 2022-11-29 DIAGNOSIS — M25512 Pain in left shoulder: Secondary | ICD-10-CM | POA: Diagnosis not present

## 2022-11-29 DIAGNOSIS — M797 Fibromyalgia: Secondary | ICD-10-CM | POA: Diagnosis not present

## 2022-11-29 DIAGNOSIS — M25561 Pain in right knee: Secondary | ICD-10-CM | POA: Diagnosis not present

## 2022-11-29 DIAGNOSIS — Z79891 Long term (current) use of opiate analgesic: Secondary | ICD-10-CM | POA: Diagnosis not present

## 2022-11-29 DIAGNOSIS — M25522 Pain in left elbow: Secondary | ICD-10-CM | POA: Diagnosis not present

## 2022-12-01 ENCOUNTER — Telehealth: Payer: Self-pay

## 2022-12-01 NOTE — Telephone Encounter (Signed)
Per chart review, the patient is currently not on insulin therapy and does not require the requested device.

## 2022-12-01 NOTE — Telephone Encounter (Signed)
Spoke to pharmacy, they are stating pt requested omnipod , for that they need an rx sent.

## 2022-12-01 NOTE — Telephone Encounter (Signed)
Copied from CRM [id number]. Topic: Clinical - Medication Question >> Dec 01, 2022 12:54 PM Clayton Bibles wrote: Reason for CRM: [facility] sent over a med request on 11/18/22 to have the doctor's authorization. They have not heard back from doctor's office. Please call [phone] to update pharmacy about med.

## 2022-12-01 NOTE — Telephone Encounter (Signed)
Can you please verify which medication they need authorization for?

## 2022-12-13 DIAGNOSIS — M25561 Pain in right knee: Secondary | ICD-10-CM | POA: Diagnosis not present

## 2022-12-13 DIAGNOSIS — M4322 Fusion of spine, cervical region: Secondary | ICD-10-CM | POA: Diagnosis not present

## 2022-12-13 DIAGNOSIS — E1140 Type 2 diabetes mellitus with diabetic neuropathy, unspecified: Secondary | ICD-10-CM | POA: Diagnosis not present

## 2022-12-13 DIAGNOSIS — M25522 Pain in left elbow: Secondary | ICD-10-CM | POA: Diagnosis not present

## 2022-12-13 DIAGNOSIS — G894 Chronic pain syndrome: Secondary | ICD-10-CM | POA: Diagnosis not present

## 2022-12-13 DIAGNOSIS — M25512 Pain in left shoulder: Secondary | ICD-10-CM | POA: Diagnosis not present

## 2022-12-13 DIAGNOSIS — Z79891 Long term (current) use of opiate analgesic: Secondary | ICD-10-CM | POA: Diagnosis not present

## 2022-12-13 DIAGNOSIS — M797 Fibromyalgia: Secondary | ICD-10-CM | POA: Diagnosis not present

## 2022-12-13 DIAGNOSIS — M4326 Fusion of spine, lumbar region: Secondary | ICD-10-CM | POA: Diagnosis not present

## 2022-12-13 DIAGNOSIS — F1120 Opioid dependence, uncomplicated: Secondary | ICD-10-CM | POA: Diagnosis not present

## 2022-12-15 ENCOUNTER — Other Ambulatory Visit: Payer: Self-pay | Admitting: Family Medicine

## 2022-12-15 MED ORDER — METFORMIN HCL 1000 MG PO TABS: 1000.0000 mg | ORAL_TABLET | Freq: Two times a day (BID) | ORAL | 2 refills | Status: DC

## 2022-12-15 NOTE — Telephone Encounter (Signed)
Copied from CRM [id number]. Topic: Clinical - Medication Refill >> Dec 15, 2022  4:15 PM Maxwell Marion wrote: Most Recent Primary Care Visit:  Provider: Anabel Halon  Department: [facility]  Visit Type: OFFICE VISIT  Date: 10/06/2022  Medication: ***  Has the patient contacted their pharmacy?  (Agent: If no, request that the patient contact the pharmacy for the refill. If patient does not wish to contact the pharmacy document the reason why and proceed with request.) (Agent: If yes, when and what did the pharmacy advise?)  Is this the correct pharmacy for this prescription?  If no, delete pharmacy and type the correct one.  This is the patient's preferred pharmacy:  [facility] - [place], [place] - [address] [place] [address] Phone: [phone] Fax: [phone]  [facility] [phone] - [place], [place] - [address] AT [facility] [address] [place] [address] Phone: [phone] Fax: [phone]  [facility] - [place], [place] - [address] [place] [address] Phone: [phone] Fax: [phone]   Has the prescription been filled recently?   Is the patient out of the medication?   Has the patient been seen for an appointment in the last year OR does the patient have an upcoming appointment?   Can we respond through MyChart?   Agent: Please be advised that Rx refills may take up to 3 business days. We ask that you follow-up with your pharmacy.

## 2022-12-16 DIAGNOSIS — E1140 Type 2 diabetes mellitus with diabetic neuropathy, unspecified: Secondary | ICD-10-CM | POA: Diagnosis not present

## 2023-01-06 DIAGNOSIS — E1140 Type 2 diabetes mellitus with diabetic neuropathy, unspecified: Secondary | ICD-10-CM | POA: Diagnosis not present

## 2023-01-17 ENCOUNTER — Encounter: Payer: Self-pay | Admitting: Internal Medicine

## 2023-01-17 ENCOUNTER — Ambulatory Visit: Payer: Medicare HMO | Attending: Internal Medicine | Admitting: Internal Medicine

## 2023-01-17 ENCOUNTER — Other Ambulatory Visit: Payer: Self-pay | Admitting: Family Medicine

## 2023-01-17 VITALS — BP 100/62 | HR 116 | Ht 61.0 in | Wt 129.4 lb

## 2023-01-17 DIAGNOSIS — I10 Essential (primary) hypertension: Secondary | ICD-10-CM | POA: Diagnosis not present

## 2023-01-17 DIAGNOSIS — I5189 Other ill-defined heart diseases: Secondary | ICD-10-CM | POA: Diagnosis not present

## 2023-01-17 DIAGNOSIS — M25512 Pain in left shoulder: Secondary | ICD-10-CM | POA: Diagnosis not present

## 2023-01-17 DIAGNOSIS — M797 Fibromyalgia: Secondary | ICD-10-CM | POA: Diagnosis not present

## 2023-01-17 DIAGNOSIS — M4322 Fusion of spine, cervical region: Secondary | ICD-10-CM | POA: Diagnosis not present

## 2023-01-17 DIAGNOSIS — E876 Hypokalemia: Secondary | ICD-10-CM

## 2023-01-17 DIAGNOSIS — R000 Tachycardia, unspecified: Secondary | ICD-10-CM | POA: Diagnosis not present

## 2023-01-17 DIAGNOSIS — G894 Chronic pain syndrome: Secondary | ICD-10-CM | POA: Diagnosis not present

## 2023-01-17 DIAGNOSIS — Z79891 Long term (current) use of opiate analgesic: Secondary | ICD-10-CM | POA: Diagnosis not present

## 2023-01-17 DIAGNOSIS — E1140 Type 2 diabetes mellitus with diabetic neuropathy, unspecified: Secondary | ICD-10-CM | POA: Diagnosis not present

## 2023-01-17 DIAGNOSIS — B009 Herpesviral infection, unspecified: Secondary | ICD-10-CM

## 2023-01-17 DIAGNOSIS — M25522 Pain in left elbow: Secondary | ICD-10-CM | POA: Diagnosis not present

## 2023-01-17 DIAGNOSIS — M25561 Pain in right knee: Secondary | ICD-10-CM | POA: Diagnosis not present

## 2023-01-17 NOTE — Patient Instructions (Signed)

## 2023-01-17 NOTE — Progress Notes (Signed)
 Cardiology Office Note  Date: 01/17/2023   ID: Patricia Bass, DOB May 21, 1960, MRN [id number]  PCP:  Zarwolo, Gloria, FNP  Cardiologist:  Diannah SHAUNNA Maywood, MD Electrophysiologist:  None     History of Present Illness: Patricia Bass is a 63 y.o. female known to have HTN, DM 2, OSA on home oxygen , chronic diastolic heart failure with severe RV dysfunction, COPD on home O2 at night is here for follow-up visit  Patient was admitted to [facility] in 03/2022 with acute on chronic diastolic heart failure exacerbation and severe RV dysfunction.  CT angio PE was negative for PE.  She was adequately diuresed and discharged to follow-up with cardiology clinic.  She underwent Lexiscan  which showed no evidence of ischemia.  She is here for follow-up visit.  She is overall doing great.  Has no symptoms of angina, DOE (SOB significantly improved compared to the last time), orthopnea, PND, leg swelling, dizziness, syncope or palpitations.  She had nausea today in the clinic visit and vomited profusely.  She reported that she ate ham this morning and probably might have caused vomiting.  Current smoker.  Past Medical History:  Diagnosis Date   Anemia    Anxiety    Arthritis    Bulging disc    Carpal tunnel syndrome    Cervicalgia    Chronic airway obstruction (HCC)    Chronic back pain    COPD (chronic obstructive pulmonary disease) (HCC)    Coronary artery disease    Diabetes mellitus (HCC) 01/11/2012   GERD (gastroesophageal reflux disease)    Headache(784.0)    Hypothyroid    Lumbago    Migraine    Myalgia and myositis, unspecified    Neuropathy    Pneumonia    Restless leg syndrome    Shortness of breath    Sleep apnea    uses O2 at home. uses it at home all times and at night at 2 liters.    Past Surgical History:  Procedure Laterality Date   BACK SURGERY     BIOPSY  12/06/2016   Procedure: BIOPSY;  Surgeon: Shaaron Lamar HERO, MD;  [facility]: [facility];  Service:  Endoscopy;;  gastric    BLADDER SUSPENSION     CARDIAC CATHETERIZATION     CHOLECYSTECTOMY     COLONOSCOPY WITH PROPOFOL  N/A 12/06/2016   Procedure: COLONOSCOPY WITH PROPOFOL   Formed stool could not do;  Surgeon: Shaaron Lamar HERO, MD;  [facility]: [facility];  Service: Endoscopy;  Laterality: N/A;  10:00am   ESOPHAGOGASTRODUODENOSCOPY (EGD) WITH PROPOFOL  N/A 12/06/2016   Procedure: ESOPHAGOGASTRODUODENOSCOPY (EGD) WITH PROPOFOL ;  Surgeon: Shaaron Lamar HERO, MD;  [facility];  Service: Endoscopy;  Laterality: N/A;   MALONEY DILATION N/A 12/06/2016   Procedure: AGAPITO DILATION;  Surgeon: Shaaron Lamar HERO, MD;  Location: [facility];  Service: Endoscopy;  Laterality: N/A;   TUBAL LIGATION     ULNAR NERVE TRANSPOSITION  04/01/2011   Procedure: ULNAR NERVE DECOMPRESSION/TRANSPOSITION;  Surgeon: Elsie Mussel, MD;  Location: [facility];  Service: Orthopedics;  Laterality: Left;  ULNAR NERVE RELEASE WITH ANTERIOR TRANS POSITION REPAIR RECONSTRUCTION AS NECESSARY    Current Outpatient Medications  Medication Sig Dispense Refill   albuterol  (VENTOLIN  HFA) 108 (90 Base) MCG/ACT inhaler Inhale 2 puffs into the lungs every 6 (six) hours as needed for wheezing or shortness of breath. 18 g 1   atorvastatin  (LIPITOR) 40 MG tablet Take 40 mg by mouth every evening.  Buprenorphine HCl-Naloxone HCl 4-1 MG FILM TAKE 1 FILM UNDER THE TONGUE AND ALLOW TO DISSOLVE TWICE A DAY     Cholecalciferol (VITAMIN D -3 PO) Take 1 capsule by mouth daily.     cyanocobalamin  1000 MCG tablet Take 1 tablet (1,000 mcg total) by mouth daily. 30 tablet 2   cycloSPORINE (RESTASIS) 0.05 % ophthalmic emulsion Apply to eye.     Ferrous Sulfate  (IRON PO) Take 1 tablet by mouth daily.     furosemide  (LASIX ) 40 MG tablet Take 1 tablet (40 mg total) by mouth 2 (two) times daily. 60 tablet 2   ibuprofen  (ADVIL ,MOTRIN ) 800 MG tablet Take 800 mg by mouth 2 (two) times daily as needed for headache or moderate pain.      ipratropium-albuterol  (DUONEB) 0.5-2.5 (3) MG/3ML SOLN Take 3 mLs by nebulization every 6 (six) hours as needed. 360 mL 1   levothyroxine  (SYNTHROID ) 88 MCG tablet Take 1 tablet (88 mcg total) by mouth daily. 30 tablet 2   lisinopril  (ZESTRIL ) 2.5 MG tablet Take 1 tablet (2.5 mg total) by mouth daily. 90 tablet 1   metFORMIN  (GLUCOPHAGE ) 1000 MG tablet Take 1 tablet (1,000 mg total) by mouth in the morning and at bedtime. 60 tablet 2   methocarbamol  (ROBAXIN ) 750 MG tablet TAKE ONE TABLET (750MG  TOTAL) BY MOUTH EVERY 8 HOURS AS NEEDED FOR MUSCLE SPASMS 60 tablet 2   metoprolol  tartrate 37.5 MG TABS Take 1 tablet (37.5 mg total) by mouth 2 (two) times daily. 180 tablet 1   naloxone (NARCAN) nasal spray 4 mg/0.1 mL SMARTSIG:Both Nares     omeprazole  (PRILOSEC) 40 MG capsule Take 1 capsule (40 mg total) by mouth daily. (Patient taking differently: Take 40 mg by mouth 2 (two) times daily as needed (acid reflux).) 30 capsule 0   ondansetron  (ZOFRAN ) 4 MG tablet Take 1 tablet (4 mg total) by mouth every 8 (eight) hours as needed for nausea or vomiting. 20 tablet 0   potassium chloride  SA (KLOR-CON  M) 20 MEQ tablet TAKE ONE TABLET (20 MEQ TOTAL) BY MOUTH DAILY AT 9AM 30 tablet 1   pregabalin  (LYRICA ) 300 MG capsule Take 1 capsule (300 mg total) by mouth 2 (two) times daily. 60 capsule 0   rOPINIRole  (REQUIP ) 3 MG tablet Take 1 tablet (3 mg total) by mouth in the morning and at bedtime. 80 tablet 2   valACYclovir  (VALTREX ) 500 MG tablet Take 1 tablet (500 mg total) by mouth 2 (two) times daily as needed (cold sores). 90 tablet 2   No current facility-administered medications for this visit.   Allergies:  Duragesic  disc transdermal system [fentanyl ]   Social History: The patient  reports that she has been smoking cigarettes and e-cigarettes. She has a 20 pack-year smoking history. She has never used smokeless tobacco. She reports current drug use. Drugs: Marijuana and Oxycodone . She reports that she does  not drink alcohol.   Family History: The patient's family history includes Asthma in her father; Heart attack in her father; Hypertension in her mother.   ROS:  Please see the history of present illness. Otherwise, complete review of systems is positive for none  All other systems are reviewed and negative.   Physical Exam: VS:  BP 100/62   Pulse (!) 116   Ht 5' 1 (1.549 m)   Wt 129 lb 6.4 oz (58.7 kg)   BMI 24.45 kg/m , BMI Body mass index is 24.45 kg/m.  Wt Readings from Last 3 Encounters:  01/17/23 129 lb 6.4  oz (58.7 kg)  10/06/22 125 lb 9.6 oz (57 kg)  06/28/22 137 lb 1.9 oz (62.2 kg)    General: Patient appears comfortable at rest. HEENT: Conjunctiva and lids normal, oropharynx clear with moist mucosa. Neck: Supple, no elevated JVP or carotid bruits, no thyromegaly. Lungs: Clear to auscultation, nonlabored breathing at rest. Cardiac: Regular rate and rhythm, no S3 or significant systolic murmur, no pericardial rub. Abdomen: Soft, nontender, no hepatomegaly, bowel sounds present, no guarding or rebound. Extremities: No pitting edema, distal pulses 2+. Skin: Warm and dry. Musculoskeletal: No kyphosis. Neuropsychiatric: Alert and oriented x3, affect grossly appropriate.  Recent Labwork: 04/01/2022: B Natriuretic Peptide 589.0 04/04/2022: Magnesium 2.0 05/26/2022: ALT 12; AST 21; BUN 25; Creatinine, Ser 1.06; Hemoglobin 13.5; Platelets 192; Potassium 4.7; Sodium 134; TSH 4.880     Component Value Date/Time   CHOL 147 05/26/2022 1531   TRIG 210 (H) 05/26/2022 1531   HDL 41 05/26/2022 1531   CHOLHDL 3.6 05/26/2022 1531   CHOLHDL 6.8 04/17/2007 0815   VLDL UNABLE TO CALCULATE IF TRIGLYCERIDE OVER 400 mg/dL 95/86/7990 9184   LDLCALC 71 05/26/2022 1531    Other Studies Reviewed Today: EKG shows sinus tachycardia versus atrial flutter  Assessment and Plan:   Chronic diastolic heart failure Severe RV dysfunction in 03/2022 Mild to moderate pericardial effusion in  03/2022 Sinus tachycardia HTN, controlled Nicotine  abuse   -Patient was admitted to [facility] in 03/2022 with acute on chronic diastolic heart failure exacerbation and had incidental finding of severe RV dysfunction.  CT angio PE was negative for PE.  Cardiac MRI was ordered in the last visit but not scheduled yet.  Will obtain limited echo to evaluate for RV function and based on that, will obtain cardiac MRI and VQ scan.  She has mild to moderate pericardial fusion on the echocardiogram performed in March 2024 but since her DOE has significantly improved, she does not need a repeat echocardiogram.  However we will follow-up on the pericardial fusion when echocardiogram is performed for RV dysfunction indication.  She also has sinus tachycardia on today's clinic visit however at home HR stays around 90s.  Continue metoprolol  tartrate 37.5 mg twice daily.  BP controlled, continue current antihypertensives.  P.o. Lasix  40 mg twice daily, lisinopril  2.5 mg once daily.  Current smoker, smoking cessation counseling.   Medication Adjustments/Labs and Tests Ordered: Current medicines are reviewed at length with the patient today.  Concerns regarding medicines are outlined above.   Tests Ordered: Orders Placed This Encounter  Procedures   EKG 12-Lead    Medication Changes: No orders of the defined types were placed in this encounter.   Disposition:  Follow up  6 months  Signed Daliya Parchment Priya Dreux Mcgroarty, MD, 01/17/2023 9:09 AM    [facility] [address] [place], [place], [place] [address]

## 2023-01-25 ENCOUNTER — Ambulatory Visit: Payer: Medicare HMO | Attending: Internal Medicine

## 2023-01-25 DIAGNOSIS — I5189 Other ill-defined heart diseases: Secondary | ICD-10-CM

## 2023-01-26 LAB — ECHOCARDIOGRAM LIMITED
Area-P 1/2: 2.73 cm2
Calc EF: 69.1 %
S' Lateral: 2.3 cm
Single Plane A2C EF: 73 %
Single Plane A4C EF: 65.3 %

## 2023-02-13 ENCOUNTER — Other Ambulatory Visit: Payer: Self-pay | Admitting: Family Medicine

## 2023-02-13 DIAGNOSIS — G2581 Restless legs syndrome: Secondary | ICD-10-CM

## 2023-02-16 ENCOUNTER — Other Ambulatory Visit: Payer: Self-pay | Admitting: Family Medicine

## 2023-02-16 MED ORDER — ATORVASTATIN CALCIUM 40 MG PO TABS: 40.0000 mg | ORAL_TABLET | Freq: Every evening | ORAL | 0 refills | Status: DC

## 2023-02-16 NOTE — Telephone Encounter (Signed)
Copied from CRM [id number]. Topic: Clinical - Medication Refill >> Feb 16, 2023  1:24 PM Phill Myron wrote: Most Recent Primary Care Visit:  Provider: Anabel Halon  Department: [facility]  Visit Type: OFFICE VISIT  Date: 10/06/2022  Medication: atorvastatin (LIPITOR) 40 MG tablet Cholecalciferol (VITAMIN D-3 PO)  Has the patient contacted their pharmacy? Yes (Agent: If no, request that the patient contact the pharmacy for the refill. If patient does not wish to contact the pharmacy document the reason why and proceed with request.) (Agent: If yes, when and what did the pharmacy advise?)  Is this the correct pharmacy for this prescription? Yes If no, delete pharmacy and type the correct one.  This is the patient's preferred pharmacy:   [facility] [place] [address] [place] [address] Phone: [phone] Fax: [phone]   I

## 2023-02-23 DIAGNOSIS — J441 Chronic obstructive pulmonary disease with (acute) exacerbation: Secondary | ICD-10-CM | POA: Diagnosis not present

## 2023-03-09 NOTE — Telephone Encounter (Signed)
 Please send to correct officeCopied from CRM [id number]. Topic: General - Other >> Mar 09, 2023  3:38 PM Santiya F wrote: Reason for CRM: [facility] is calling in because they need to verify patient's diagnosis so she can stay in her program, [phone]

## 2023-03-11 DIAGNOSIS — Z4542 Encounter for adjustment and management of neuropacemaker (brain) (peripheral nerve) (spinal cord): Secondary | ICD-10-CM | POA: Diagnosis not present

## 2023-03-11 DIAGNOSIS — E1140 Type 2 diabetes mellitus with diabetic neuropathy, unspecified: Secondary | ICD-10-CM | POA: Diagnosis not present

## 2023-03-15 ENCOUNTER — Other Ambulatory Visit: Payer: Self-pay | Admitting: Family Medicine

## 2023-03-15 DIAGNOSIS — M545 Low back pain, unspecified: Secondary | ICD-10-CM

## 2023-03-23 DIAGNOSIS — J441 Chronic obstructive pulmonary disease with (acute) exacerbation: Secondary | ICD-10-CM | POA: Diagnosis not present

## 2023-04-01 ENCOUNTER — Telehealth: Payer: Self-pay | Admitting: Family Medicine

## 2023-04-01 ENCOUNTER — Telehealth: Payer: Self-pay

## 2023-04-01 NOTE — Telephone Encounter (Signed)
 Copied from CRM [id number]. Topic: General - Other >> Mar 31, 2023  2:15 PM Shardie S wrote: Reason for CRM: Patient's Contractor requesting patient's medical history in relation to CHF. Contacted CAL to obtain information.  Insurance provider dropped call.

## 2023-04-01 NOTE — Telephone Encounter (Signed)
 Copied from CRM [id number]. Topic: Clinical - Medication Question >> Apr 01, 2023 12:43 PM Shelah Lewandowsky wrote: Reason for CRM: Claris Gower with [employer] checking to see if fax received regarding Freestyle Libre 3 plus- [phone] opt 3

## 2023-04-07 NOTE — Telephone Encounter (Signed)
 The patient needs to call in and let us know if she wants to use this company before we complete the form.

## 2023-04-12 ENCOUNTER — Other Ambulatory Visit: Payer: Self-pay | Admitting: Family Medicine

## 2023-04-12 DIAGNOSIS — E538 Deficiency of other specified B group vitamins: Secondary | ICD-10-CM

## 2023-04-18 DIAGNOSIS — J449 Chronic obstructive pulmonary disease, unspecified: Secondary | ICD-10-CM | POA: Diagnosis not present

## 2023-04-18 DIAGNOSIS — E119 Type 2 diabetes mellitus without complications: Secondary | ICD-10-CM | POA: Diagnosis not present

## 2023-04-18 DIAGNOSIS — I5032 Chronic diastolic (congestive) heart failure: Secondary | ICD-10-CM | POA: Diagnosis not present

## 2023-04-18 DIAGNOSIS — M961 Postlaminectomy syndrome, not elsewhere classified: Secondary | ICD-10-CM | POA: Diagnosis not present

## 2023-04-18 DIAGNOSIS — I110 Hypertensive heart disease with heart failure: Secondary | ICD-10-CM | POA: Diagnosis not present

## 2023-04-18 DIAGNOSIS — E871 Hypo-osmolality and hyponatremia: Secondary | ICD-10-CM | POA: Diagnosis not present

## 2023-04-18 DIAGNOSIS — Z01818 Encounter for other preprocedural examination: Secondary | ICD-10-CM | POA: Diagnosis not present

## 2023-04-19 ENCOUNTER — Other Ambulatory Visit: Payer: Self-pay | Admitting: Internal Medicine

## 2023-04-19 DIAGNOSIS — I10 Essential (primary) hypertension: Secondary | ICD-10-CM

## 2023-04-20 ENCOUNTER — Telehealth: Payer: Self-pay

## 2023-04-20 NOTE — Telephone Encounter (Signed)
 Copied from CRM [id number]. Topic: General - Other >> Apr 20, 2023 11:17 AM Phil Braun wrote: Reason for CRM:   [facility]  Requested lansets and new glucose meter for pt but I do not see it on her list of medicine.   Please advise, [phone].

## 2023-04-21 ENCOUNTER — Telehealth: Payer: Self-pay | Admitting: Family Medicine

## 2023-04-21 ENCOUNTER — Other Ambulatory Visit: Payer: Self-pay | Admitting: Family Medicine

## 2023-04-21 DIAGNOSIS — E538 Deficiency of other specified B group vitamins: Secondary | ICD-10-CM

## 2023-04-21 MED ORDER — CYANOCOBALAMIN 1000 MCG PO TABS: 1000.0000 ug | ORAL_TABLET | Freq: Every day | ORAL | 2 refills | Status: DC

## 2023-04-21 NOTE — Telephone Encounter (Signed)
 Medication sent to pharmacy today.   Copied from CRM [id number]. Topic: Clinical - Medication Refill >> Apr 21, 2023  4:01 PM Ivette P wrote: Most Recent Primary Care Visit:  Provider: Meldon Sport  Department: [facility]  Visit Type: OFFICE VISIT  Date: 10/06/2022  Medication: Vitamin B12 1000 MCG Tablet  Has the patient contacted their pharmacy? Yes (Agent: If no, request that the patient contact the pharmacy for the refill. If patient does not wish to contact the pharmacy document the reason why and proceed with request.) (Agent: If yes, when and what did the pharmacy advise?)  Is this the correct pharmacy for this prescription? Yes If no, delete pharmacy and type the correct one.  This is the patient's preferred pharmacy:  [facility] [place] [address] [place] [address] Phone: [phone] Fax: [phone]   Has the prescription been filled recently? No  Is the patient out of the medication? Yes  Has the patient been seen for an appointment in the last year OR does the patient have an upcoming appointment? No, last visit 09/28/2022  Can we respond through MyChart? No  Agent: Please be advised that Rx refills may take up to 3 business days. We ask that you follow-up with your pharmacy.

## 2023-04-21 NOTE — Telephone Encounter (Signed)
 Copied from CRM [id number]. Topic: Clinical - Medication Refill >> Apr 21, 2023  4:01 PM Ivette P wrote: Most Recent Primary Care Visit:  Provider: Meldon Sport  Department: [facility]  Visit Type: OFFICE VISIT  Date: 10/06/2022  Medication: Vitamin B12 1000 MCG Tablet  Has the patient contacted their pharmacy? Yes (Agent: If no, request that the patient contact the pharmacy for the refill. If patient does not wish to contact the pharmacy document the reason why and proceed with request.) (Agent: If yes, when and what did the pharmacy advise?)  Is this the correct pharmacy for this prescription? Yes If no, delete pharmacy and type the correct one.  This is the patient's preferred pharmacy:  [facility] [place] [address] [place] [address] Phone: [phone] Fax: [phone]   Has the prescription been filled recently? No  Is the patient out of the medication? Yes  Has the patient been seen for an appointment in the last year OR does the patient have an upcoming appointment? No, last visit 09/28/2022  Can we respond through MyChart? No  Agent: Please be advised that Rx refills may take up to 3 business days. We ask that you follow-up with your pharmacy.

## 2023-04-22 NOTE — Telephone Encounter (Signed)
 [facility]

## 2023-04-23 DIAGNOSIS — J441 Chronic obstructive pulmonary disease with (acute) exacerbation: Secondary | ICD-10-CM | POA: Diagnosis not present

## 2023-04-28 DIAGNOSIS — J449 Chronic obstructive pulmonary disease, unspecified: Secondary | ICD-10-CM | POA: Diagnosis not present

## 2023-04-28 DIAGNOSIS — I110 Hypertensive heart disease with heart failure: Secondary | ICD-10-CM | POA: Diagnosis not present

## 2023-04-28 DIAGNOSIS — E1142 Type 2 diabetes mellitus with diabetic polyneuropathy: Secondary | ICD-10-CM | POA: Diagnosis not present

## 2023-04-28 DIAGNOSIS — Z4542 Encounter for adjustment and management of neuropacemaker (brain) (peripheral nerve) (spinal cord): Secondary | ICD-10-CM | POA: Diagnosis not present

## 2023-04-28 DIAGNOSIS — Z9981 Dependence on supplemental oxygen: Secondary | ICD-10-CM | POA: Diagnosis not present

## 2023-04-28 DIAGNOSIS — M4716 Other spondylosis with myelopathy, lumbar region: Secondary | ICD-10-CM | POA: Diagnosis not present

## 2023-04-28 DIAGNOSIS — M961 Postlaminectomy syndrome, not elsewhere classified: Secondary | ICD-10-CM | POA: Diagnosis not present

## 2023-04-28 DIAGNOSIS — I5042 Chronic combined systolic (congestive) and diastolic (congestive) heart failure: Secondary | ICD-10-CM | POA: Diagnosis not present

## 2023-04-28 DIAGNOSIS — E1140 Type 2 diabetes mellitus with diabetic neuropathy, unspecified: Secondary | ICD-10-CM | POA: Diagnosis not present

## 2023-04-28 DIAGNOSIS — F17210 Nicotine dependence, cigarettes, uncomplicated: Secondary | ICD-10-CM | POA: Diagnosis not present

## 2023-04-28 DIAGNOSIS — Z9682 Presence of neurostimulator: Secondary | ICD-10-CM | POA: Diagnosis not present

## 2023-05-09 ENCOUNTER — Other Ambulatory Visit: Payer: Self-pay | Admitting: Family Medicine

## 2023-05-09 DIAGNOSIS — E538 Deficiency of other specified B group vitamins: Secondary | ICD-10-CM

## 2023-05-11 ENCOUNTER — Other Ambulatory Visit: Payer: Self-pay | Admitting: Family Medicine

## 2023-05-12 ENCOUNTER — Telehealth: Payer: Self-pay | Admitting: Pharmacy Technician

## 2023-05-12 ENCOUNTER — Other Ambulatory Visit (HOSPITAL_COMMUNITY): Payer: Self-pay

## 2023-05-12 NOTE — Telephone Encounter (Signed)
 Pharmacy Patient Advocate Encounter   Received notification from Fax that prior authorization for metoprolol  is required/requested.   Insurance verification completed.   The patient is insured through [employer] .   Per test claim: PA required; PA submitted to above mentioned insurance via CoverMyMeds Key/confirmation #/EOC [id number] Status is pending

## 2023-05-12 NOTE — Telephone Encounter (Signed)
 Pharmacy Patient Advocate Encounter  Received notification from [employer] that Prior Authorization for metoprolol  has been APPROVED from 05/05/23 to 05/11/24. Unable to obtain price due to refill too soon rejection, last fill date 05/11/23 next available fill date05/30/25   PA #/Case ID/Reference #: [id number]

## 2023-05-17 ENCOUNTER — Telehealth: Payer: Self-pay | Admitting: Family Medicine

## 2023-05-17 ENCOUNTER — Other Ambulatory Visit: Payer: Self-pay | Admitting: Family Medicine

## 2023-05-17 DIAGNOSIS — E538 Deficiency of other specified B group vitamins: Secondary | ICD-10-CM

## 2023-05-17 NOTE — Telephone Encounter (Unsigned)
 Copied from CRM [id number]. Topic: Clinical - Medication Refill >> May 17, 2023  3:05 PM Stanly Early wrote: Medication: cyanocobalamin  (VITAMIN B12) 1000 MCG tablet  Has the patient contacted their pharmacy? Yes (Agent: If no, request that the patient contact the pharmacy for the refill. If patient does not wish to contact the pharmacy document the reason why and proceed with request.) (Agent: If yes, when and what did the pharmacy advise?)  [facility] [place] [address] [place] [address] Phone: [phone] Fax: [phone]  Is this the correct pharmacy for this prescription? Yes If no, delete pharmacy and type the correct one.   Has the prescription been filled recently? yes  Is the patient out of the medication? No  Has the patient been seen for an appointment in the last year OR does the patient have an upcoming appointment? Yes  Can we respond through MyChart? No  Agent: Please be advised that Rx refills may take up to 3 business days. We ask that you follow-up with your pharmacy.

## 2023-05-27 ENCOUNTER — Other Ambulatory Visit: Payer: Self-pay | Admitting: Family Medicine

## 2023-05-27 DIAGNOSIS — G8929 Other chronic pain: Secondary | ICD-10-CM

## 2023-05-27 DIAGNOSIS — Z4889 Encounter for other specified surgical aftercare: Secondary | ICD-10-CM | POA: Diagnosis not present

## 2023-05-27 DIAGNOSIS — Z9689 Presence of other specified functional implants: Secondary | ICD-10-CM | POA: Diagnosis not present

## 2023-05-27 DIAGNOSIS — E1140 Type 2 diabetes mellitus with diabetic neuropathy, unspecified: Secondary | ICD-10-CM | POA: Diagnosis not present

## 2023-05-27 MED ORDER — METHOCARBAMOL 750 MG PO TABS: 750.0000 mg | ORAL_TABLET | Freq: Two times a day (BID) | ORAL | 0 refills | Status: DC | PRN

## 2023-05-27 MED ORDER — IBUPROFEN 800 MG PO TABS: 800.0000 mg | ORAL_TABLET | Freq: Two times a day (BID) | ORAL | 0 refills | Status: DC | PRN

## 2023-05-27 NOTE — Telephone Encounter (Signed)
 Copied from CRM [id number]. Topic: Clinical - Medication Refill >> May 27, 2023  9:41 AM Patricia Bass wrote: Medication:  ibuprofen  (ADVIL ,MOTRIN ) 800 MG tablet   methocarbamol  (ROBAXIN ) 750 MG tablet    Has the patient contacted their pharmacy? Yes (Agent: If no, request that the patient contact the pharmacy for the refill. If patient does not wish to contact the pharmacy document the reason why and proceed with request.) (Agent: If yes, when and what did the pharmacy advise?)  This is the patient's preferred pharmacy:   [facility] [place] [address] [place] [address] Phone: [phone] Fax: [phone]  Is this the correct pharmacy for this prescription? Yes If no, delete pharmacy and type the correct one.   Has the prescription been filled recently? Yes  Is the patient out of the medication? Yes  Has the patient been seen for an appointment in the last year OR does the patient have an upcoming appointment? Yes  Can we respond through MyChart? No.  Agent: Please be advised that Rx refills may take up to 3 business days. We ask that you follow-up with your pharmacy.

## 2023-06-13 ENCOUNTER — Other Ambulatory Visit: Payer: Self-pay | Admitting: Family Medicine

## 2023-06-13 DIAGNOSIS — M545 Low back pain, unspecified: Secondary | ICD-10-CM

## 2023-06-17 NOTE — Progress Notes (Signed)
   06/17/2023  Patient ID: Patricia Bass, female   DOB: 05/17/1960, 63 y.o.   MRN: [id number]  Pharmacy Quality Measure Review  This patient is appearing on a report for being at risk of failing the adherence measure for cholesterol (statin) medications this calendar year.   Medication: atorvastatin  40mg  las filled 5/6 and 6/5 for 30 DSs  Insurance report was not up to date. No action needed at this time.   Rolando Cliche, PharmD, BCGP Clinical Pharmacist  [phone]

## 2023-06-28 DIAGNOSIS — Z79891 Long term (current) use of opiate analgesic: Secondary | ICD-10-CM | POA: Diagnosis not present

## 2023-06-28 DIAGNOSIS — M25512 Pain in left shoulder: Secondary | ICD-10-CM | POA: Diagnosis not present

## 2023-06-28 DIAGNOSIS — M797 Fibromyalgia: Secondary | ICD-10-CM | POA: Diagnosis not present

## 2023-06-28 DIAGNOSIS — M25561 Pain in right knee: Secondary | ICD-10-CM | POA: Diagnosis not present

## 2023-06-28 DIAGNOSIS — E1140 Type 2 diabetes mellitus with diabetic neuropathy, unspecified: Secondary | ICD-10-CM | POA: Diagnosis not present

## 2023-06-28 DIAGNOSIS — M4326 Fusion of spine, lumbar region: Secondary | ICD-10-CM | POA: Diagnosis not present

## 2023-06-28 DIAGNOSIS — M25522 Pain in left elbow: Secondary | ICD-10-CM | POA: Diagnosis not present

## 2023-06-28 DIAGNOSIS — G894 Chronic pain syndrome: Secondary | ICD-10-CM | POA: Diagnosis not present

## 2023-06-28 DIAGNOSIS — M4322 Fusion of spine, cervical region: Secondary | ICD-10-CM | POA: Diagnosis not present

## 2023-07-12 ENCOUNTER — Other Ambulatory Visit: Payer: Self-pay | Admitting: Family Medicine

## 2023-07-12 DIAGNOSIS — E538 Deficiency of other specified B group vitamins: Secondary | ICD-10-CM

## 2023-07-15 ENCOUNTER — Other Ambulatory Visit: Payer: Self-pay | Admitting: Family Medicine

## 2023-08-05 ENCOUNTER — Other Ambulatory Visit: Payer: Self-pay | Admitting: Family Medicine

## 2023-08-05 DIAGNOSIS — E538 Deficiency of other specified B group vitamins: Secondary | ICD-10-CM

## 2023-08-11 ENCOUNTER — Other Ambulatory Visit: Payer: Self-pay | Admitting: Family Medicine

## 2023-08-11 DIAGNOSIS — E538 Deficiency of other specified B group vitamins: Secondary | ICD-10-CM

## 2023-09-02 ENCOUNTER — Other Ambulatory Visit: Payer: Self-pay | Admitting: Family Medicine

## 2023-09-02 DIAGNOSIS — E538 Deficiency of other specified B group vitamins: Secondary | ICD-10-CM

## 2023-10-03 DIAGNOSIS — M797 Fibromyalgia: Secondary | ICD-10-CM | POA: Diagnosis not present

## 2023-10-03 DIAGNOSIS — M4322 Fusion of spine, cervical region: Secondary | ICD-10-CM | POA: Diagnosis not present

## 2023-10-03 DIAGNOSIS — G894 Chronic pain syndrome: Secondary | ICD-10-CM | POA: Diagnosis not present

## 2023-10-03 DIAGNOSIS — M25522 Pain in left elbow: Secondary | ICD-10-CM | POA: Diagnosis not present

## 2023-10-03 DIAGNOSIS — M4326 Fusion of spine, lumbar region: Secondary | ICD-10-CM | POA: Diagnosis not present

## 2023-10-03 DIAGNOSIS — Z79891 Long term (current) use of opiate analgesic: Secondary | ICD-10-CM | POA: Diagnosis not present

## 2023-10-03 DIAGNOSIS — E1140 Type 2 diabetes mellitus with diabetic neuropathy, unspecified: Secondary | ICD-10-CM | POA: Diagnosis not present

## 2023-10-03 DIAGNOSIS — M25561 Pain in right knee: Secondary | ICD-10-CM | POA: Diagnosis not present

## 2023-10-03 DIAGNOSIS — M25512 Pain in left shoulder: Secondary | ICD-10-CM | POA: Diagnosis not present

## 2023-10-04 ENCOUNTER — Ambulatory Visit: Payer: Self-pay

## 2023-10-04 NOTE — Telephone Encounter (Signed)
 FYI Only or Action Required?: Action required by provider: medication refill request.  Patient was last seen in primary care on 10/06/2022 by Tobie Suzzane POUR, MD.  Called Nurse Triage reporting Medication Refill.  Symptoms began several weeks ago.  Interventions attempted: Nothing.  Symptoms are: gradually worsening.Reflux. Requests refill ASAP  Triage Disposition: Call PCP When Office is Open  Patient/caregiver understands and will follow disposition?:   Copied from CRM #[id number]. Topic: Clinical - Red Word Triage >> Oct 04, 2023  2:49 PM Avram MATSU wrote: Red Word that prompted transfer to Nurse Triage: patient has heart burn due to not having her omeprazole  (PRILOSEC) 40 MG capsule [[id number]] Reason for Disposition  [1] Prescription refill request for NON-ESSENTIAL medicine (i.e., no harm to patient if med not taken) AND [2] triager unable to refill per department policy  Answer Assessment - Initial Assessment Questions 1. DRUG NAME: What medicine do you need to have refilled?     prilosec 2. REFILLS REMAINING: How many refills are remaining? Notes: The label on the medicine or pill bottle will show how many refills are remaining. If there are no refills remaining, then a renewal may be needed.     0 3. EXPIRATION DATE: What is the expiration date? Note: The label states when the prescription will expire, and thus can no longer be refilled.)     N/a 4. PRESCRIBER: Who prescribed it? Note: The prescribing doctor or group is responsible for refill approvals.SABRA     PCP 5. PHARMACY: Have you contacted your pharmacy (drugstore)? Note: Some pharmacies will contact the doctor (or NP/PA).      [facility]  6. SYMPTOMS: Do you have any symptoms?     Acid reflux mild pain 7. PREGNANCY: Is there any chance that you are pregnant? When was your last menstrual period?     no  Protocols used: Medication Refill and Renewal Call-A-AH

## 2023-10-05 ENCOUNTER — Other Ambulatory Visit: Payer: Self-pay | Admitting: Family Medicine

## 2023-10-05 DIAGNOSIS — K219 Gastro-esophageal reflux disease without esophagitis: Secondary | ICD-10-CM

## 2023-10-05 MED ORDER — OMEPRAZOLE 40 MG PO CPDR: 40.0000 mg | DELAYED_RELEASE_CAPSULE | Freq: Every day | ORAL | 0 refills | Status: AC

## 2023-11-02 ENCOUNTER — Encounter: Payer: Self-pay | Admitting: *Deleted

## 2023-11-10 ENCOUNTER — Ambulatory Visit: Payer: Self-pay | Admitting: Family Medicine

## 2023-11-11 ENCOUNTER — Ambulatory Visit: Payer: Self-pay

## 2023-11-11 NOTE — Telephone Encounter (Signed)
 This RN made the 2nd attempt to contact pt. No answer, unable to leave VM.

## 2023-11-11 NOTE — Telephone Encounter (Signed)
   Message from [place] R sent at 11/11/2023  9:51 AM EST   Calling to reschedule her missed appointment but Pt having Vaginal discharge, Upset stomach when she takes her morning meds- Metformin , Vitamin b12, D3, potassium chloride  SA (KLOR-CON  M) 20, asprin 81 mg, Ferrous Sulfate  (IRON PO)

## 2023-11-11 NOTE — Telephone Encounter (Signed)
 Message from [place] R sent at 11/11/2023  9:51 AM EST   Calling to reschedule her missed appointment but Pt having Vaginal discharge, Upset stomach when she takes her morning meds- Metformin , Vitamin b12, D3, potassium chloride  SA (KLOR-CON  M) 20, asprin 81 mg, Ferrous Sulfate  (IRON PO)   Third attempt to contact patient. No answer. Unable to leave a voicemail. Will route to the office.

## 2023-11-16 ENCOUNTER — Encounter: Payer: Self-pay | Admitting: Physician Assistant

## 2023-11-16 ENCOUNTER — Ambulatory Visit (INDEPENDENT_AMBULATORY_CARE_PROVIDER_SITE_OTHER): Admitting: Physician Assistant

## 2023-11-16 VITALS — BP 126/84 | HR 91 | Temp 97.2°F | Wt 141.4 lb

## 2023-11-16 DIAGNOSIS — N898 Other specified noninflammatory disorders of vagina: Secondary | ICD-10-CM | POA: Diagnosis not present

## 2023-11-16 NOTE — Assessment & Plan Note (Signed)
 Patient presents today with white, sometimes thick discharge for 1-2 months without associated symptoms. Differential includes physiological changes vs infection. - Provided self-swab to rule out infection. Awaiting swab results before further intervention. - Handout provided on yeast infection information, patient to monitor symptoms.

## 2023-11-16 NOTE — Progress Notes (Signed)
   Acute Office Visit  Subjective:     Patient ID: Patricia Bass, female    DOB: 21-Sep-1960, 63 y.o.   MRN: [id number]   Discussed the use of AI scribe software for clinical note transcription with the patient, who gave verbal consent to proceed.  History of Present Illness Patricia Bass is a 63 year old female who presents with vaginal discharge.  She has experienced vaginal discharge for one to two months. The discharge is white and sometimes thick, differing from her usual absence of discharge. There are no associated symptoms such as abdominal pain, nausea, vomiting, fevers, bleeding, urinary issues, bowel habit changes, vaginal or skin irritation, or vaginal odor. She experiences an upset stomach after taking her morning medication. During sexual activity, she does not experience lubrication.    Review of Systems  Constitutional:  Negative for chills, fever and malaise/fatigue.  Gastrointestinal:  Positive for nausea. Negative for abdominal pain and vomiting.  Genitourinary:  Negative for dysuria, frequency and urgency.  Neurological:  Negative for dizziness and headaches.        Objective:     BP 126/84 (BP Location: Left Arm, Patient Position: Sitting)   Pulse 91   Temp (!) 97.2 F (36.2 C)   Wt 141 lb 6 oz (64.1 kg)   SpO2 96%   BMI 26.71 kg/m   Physical Exam Constitutional:      General: She is not in acute distress.    Appearance: Normal appearance. She is not ill-appearing.  HENT:     Head: Normocephalic and atraumatic.     Mouth/Throat:     Mouth: Mucous membranes are moist.     Pharynx: Oropharynx is clear.  Eyes:     Extraocular Movements: Extraocular movements intact.     Conjunctiva/sclera: Conjunctivae normal.  Cardiovascular:     Rate and Rhythm: Normal rate and regular rhythm.     Heart sounds: Normal heart sounds. No murmur heard. Pulmonary:     Effort: Pulmonary effort is normal.     Breath sounds: Normal breath sounds.   Genitourinary:    Comments: Defers, obtaining self-swab Skin:    General: Skin is warm and dry.  Neurological:     General: No focal deficit present.     Mental Status: She is alert and oriented to person, place, and time.  Psychiatric:        Mood and Affect: Mood normal.        Behavior: Behavior normal.     No results found for any visits on 11/16/23.      Assessment & Plan:  Vaginal discharge Assessment & Plan: Patient presents today with white, sometimes thick discharge for 1-2 months without associated symptoms. Differential includes physiological changes vs infection. - Provided self-swab to rule out infection. Awaiting swab results before further intervention. - Handout provided on yeast infection information, patient to monitor symptoms.   Orders: -     NuSwab Vaginitis Plus (VG+)    Return if symptoms worsen or fail to improve.  Charmaine Marjie Chea, PA-C

## 2023-11-17 ENCOUNTER — Encounter: Payer: Self-pay | Admitting: Family Medicine

## 2023-11-17 ENCOUNTER — Ambulatory Visit (INDEPENDENT_AMBULATORY_CARE_PROVIDER_SITE_OTHER): Admitting: Family Medicine

## 2023-11-17 VITALS — BP 130/83 | HR 101 | Ht 61.0 in | Wt 141.0 lb

## 2023-11-17 DIAGNOSIS — R7301 Impaired fasting glucose: Secondary | ICD-10-CM

## 2023-11-17 DIAGNOSIS — E7849 Other hyperlipidemia: Secondary | ICD-10-CM

## 2023-11-17 DIAGNOSIS — E038 Other specified hypothyroidism: Secondary | ICD-10-CM

## 2023-11-17 DIAGNOSIS — E559 Vitamin D deficiency, unspecified: Secondary | ICD-10-CM

## 2023-11-17 DIAGNOSIS — E1165 Type 2 diabetes mellitus with hyperglycemia: Secondary | ICD-10-CM

## 2023-11-17 DIAGNOSIS — Z9981 Dependence on supplemental oxygen: Secondary | ICD-10-CM | POA: Diagnosis not present

## 2023-11-17 DIAGNOSIS — J449 Chronic obstructive pulmonary disease, unspecified: Secondary | ICD-10-CM

## 2023-11-17 DIAGNOSIS — I10 Essential (primary) hypertension: Secondary | ICD-10-CM

## 2023-11-17 NOTE — Progress Notes (Signed)
 Established Patient Office Visit  Subjective:  Patient ID: Patricia Bass, female    DOB: 31-Jul-1960  Age: 64 y.o. MRN: [id number]  CC:  Chief Complaint  Patient presents with   Labs Only    Requesting A1C to be checked   DME    Needs new mouth piece for nebulizer , and is in need of new oxygen  tank     HPI Patricia Bass is a 63 y.o. female with past medical history of COPD, Migraines, GERD, T2DM and tobacco use presents for f/u of  chronic medical conditions.  For the details of today's visit, please refer to the assessment and plan.     Past Medical History:  Diagnosis Date   Anemia    Anxiety    Arthritis    Bulging disc    Carpal tunnel syndrome    Cervicalgia    Chronic airway obstruction (HCC)    Chronic back pain    COPD (chronic obstructive pulmonary disease) (HCC)    Coronary artery disease    Diabetes mellitus (HCC) 01/11/2012   GERD (gastroesophageal reflux disease)    Headache(784.0)    Hypothyroid    Lumbago    Migraine    Myalgia and myositis, unspecified    Neuropathy    Pneumonia    Restless leg syndrome    Shortness of breath    Sleep apnea    uses O2 at home. uses it at home all times and at night at 2 liters.    Past Surgical History:  Procedure Laterality Date   BACK SURGERY     BIOPSY  12/06/2016   Procedure: BIOPSY;  Surgeon: Shaaron Lamar HERO, MD;  Location: [facility];  Service: Endoscopy;;  gastric    BLADDER SUSPENSION     CARDIAC CATHETERIZATION     CHOLECYSTECTOMY     COLONOSCOPY WITH PROPOFOL  N/A 12/06/2016   Procedure: COLONOSCOPY WITH PROPOFOL   Formed stool could not do;  Surgeon: Shaaron Lamar HERO, MD;  Location: [facility];  Service: Endoscopy;  Laterality: N/A;  10:00am   ESOPHAGOGASTRODUODENOSCOPY (EGD) WITH PROPOFOL  N/A 12/06/2016   Procedure: ESOPHAGOGASTRODUODENOSCOPY (EGD) WITH PROPOFOL ;  Surgeon: Shaaron Lamar HERO, MD;  Location: [facility];  Service: Endoscopy;  Laterality: N/A;   MALONEY DILATION N/A  12/06/2016   Procedure: AGAPITO DILATION;  Surgeon: Shaaron Lamar HERO, MD;  Location: [facility];  Service: Endoscopy;  Laterality: N/A;   TUBAL LIGATION     ULNAR NERVE TRANSPOSITION  04/01/2011   Procedure: ULNAR NERVE DECOMPRESSION/TRANSPOSITION;  Surgeon: Elsie Mussel, MD;  Location: [facility];  Service: Orthopedics;  Laterality: Left;  ULNAR NERVE RELEASE WITH ANTERIOR TRANS POSITION REPAIR RECONSTRUCTION AS NECESSARY    Family History  Problem Relation Age of Onset   Hypertension Mother    Asthma Father    Heart attack Father    Colon cancer Neg Hx    Celiac disease Neg Hx     Social History   Socioeconomic History   Marital status: Married    Spouse name: Not on file   Number of children: Not on file   Years of education: Not on file   Highest education level: Not on file  Occupational History   Not on file  Tobacco Use   Smoking status: Every Day    Current packs/day: 1.00    Average packs/day: 1 pack/day for 20.0 years (20.0 ttl pk-yrs)    Types: Cigarettes, E-cigarettes   Smokeless tobacco: Never  Vaping Use  Vaping status: Never Used  Substance and Sexual Activity   Alcohol use: No   Drug use: Yes    Types: Marijuana, Oxycodone     Comment: last 2020   Sexual activity: Never  Other Topics Concern   Not on file  Social History Narrative   Not on file   Social Drivers of Health   Financial Resource Strain: Not on file  Food Insecurity: No Food Insecurity (04/01/2022)   Hunger Vital Sign    Worried About Running Out of Food in the Last Year: Never true    Ran Out of Food in the Last Year: Never true  Transportation Needs: No Transportation Needs (04/01/2022)   PRAPARE - Administrator, Civil Service (Medical): No    Lack of Transportation (Non-Medical): No  Physical Activity: Not on file  Stress: Not on file  Social Connections: Not on file  Intimate Partner Violence: Not At Risk (04/01/2022)   Humiliation, Afraid, Rape, and Kick  questionnaire    Fear of Current or Ex-Partner: No    Emotionally Abused: No    Physically Abused: No    Sexually Abused: No    Outpatient Medications Prior to Visit  Medication Sig Dispense Refill   albuterol  (VENTOLIN  HFA) 108 (90 Base) MCG/ACT inhaler Inhale 2 puffs into the lungs every 6 (six) hours as needed for wheezing or shortness of breath. 18 g 1   atorvastatin  (LIPITOR) 40 MG tablet Take 1 tablet (40 mg total) by mouth every evening. 30 tablet 0   Buprenorphine HCl-Naloxone HCl 4-1 MG FILM TAKE 1 FILM UNDER THE TONGUE AND ALLOW TO DISSOLVE TWICE A DAY     Cholecalciferol (VITAMIN D -3 PO) Take 1 capsule by mouth daily.     cyanocobalamin  (VITAMIN B12) 1000 MCG tablet TAKE ONE TABLET BY MOUTH DAILY AT 9AM 30 tablet 2   cycloSPORINE (RESTASIS) 0.05 % ophthalmic emulsion Apply to eye.     Ferrous Sulfate  (IRON PO) Take 1 tablet by mouth daily.     furosemide  (LASIX ) 40 MG tablet Take 1 tablet (40 mg total) by mouth 2 (two) times daily. 60 tablet 2   ibuprofen  (ADVIL ) 800 MG tablet Take 1 tablet (800 mg total) by mouth 2 (two) times daily as needed for headache or moderate pain (pain score 4-6). 30 tablet 0   ipratropium-albuterol  (DUONEB) 0.5-2.5 (3) MG/3ML SOLN Take 3 mLs by nebulization every 6 (six) hours as needed. 360 mL 1   levothyroxine  (SYNTHROID ) 88 MCG tablet Take 1 tablet (88 mcg total) by mouth daily. 30 tablet 2   lisinopril  (ZESTRIL ) 2.5 MG tablet TAKE ONE TABLET (2.5MG  TOTAL) BY MOUTH DAILY AT 9AM 90 tablet 1   metFORMIN  (GLUCOPHAGE ) 1000 MG tablet TAKE ONE TABLET (1000 MG TOTAL) BY MOUTH TWICE DAILY AT 9AM & 9PM (IN THE MORNING AND AT BEDTIME) 60 tablet 2   methocarbamol  (ROBAXIN ) 750 MG tablet TAKE ONE TABLET (750 mg total) BY MOUTH TWICE DAILY AS NEEDED FOR MUSCLE SPASMS 60 tablet 11   Metoprolol  Tartrate 37.5 MG TABS TAKE ONE TABLET (37.5MG ) BY MOUTH TWICE DAILY @ 9AM & 5PM 180 tablet 1   naloxone (NARCAN) nasal spray 4 mg/0.1 mL SMARTSIG:Both Nares     omeprazole   (PRILOSEC) 40 MG capsule Take 1 capsule (40 mg total) by mouth daily. 30 capsule 0   ondansetron  (ZOFRAN ) 4 MG tablet Take 1 tablet (4 mg total) by mouth every 8 (eight) hours as needed for nausea or vomiting. 20 tablet 0   potassium  chloride SA (KLOR-CON  M) 20 MEQ tablet TAKE ONE TABLET (20 MEQ TOTAL) BY MOUTH DAILY AT 9AM 30 tablet 11   pregabalin  (LYRICA ) 300 MG capsule Take 1 capsule (300 mg total) by mouth 2 (two) times daily. 60 capsule 0   rOPINIRole  (REQUIP ) 3 MG tablet TAKE ONE TABLET (3MG  TOTAL) BY MOUTH TWICE DAILY @ 9AM, 9PM (IN THE MORNING AND AT BEDTIME) 80 tablet 11   valACYclovir  (VALTREX ) 500 MG tablet TAKE ONE TABLET (500 MG TOTAL) BY MOUTH TWICE DAILY AS NEEDED (FOR COLD SORES) 90 tablet 11   No facility-administered medications prior to visit.    Allergies  Allergen Reactions   Duragesic  Disc Transdermal System [Fentanyl ] Nausea And Vomiting    ROS Review of Systems  Constitutional:  Negative for chills and fever.  Eyes:  Negative for visual disturbance.  Respiratory:  Negative for chest tightness and shortness of breath.   Neurological:  Negative for dizziness and headaches.      Objective:    Physical Exam HENT:     Head: Normocephalic.     Mouth/Throat:     Mouth: Mucous membranes are moist.  Cardiovascular:     Rate and Rhythm: Normal rate.     Heart sounds: Normal heart sounds.  Pulmonary:     Effort: Pulmonary effort is normal.     Breath sounds: Normal breath sounds.  Neurological:     Mental Status: She is alert.     BP 130/83   Pulse (!) 101   Ht 5' 1 (1.549 m)   Wt 141 lb (64 kg)   SpO2 97%   BMI 26.64 kg/m  Wt Readings from Last 3 Encounters:  11/17/23 141 lb (64 kg)  11/16/23 141 lb 6 oz (64.1 kg)  01/17/23 129 lb 6.4 oz (58.7 kg)    Lab Results  Component Value Date   TSH 4.880 (H) 05/26/2022   Lab Results  Component Value Date   WBC 11.8 (H) 05/26/2022   HGB 13.5 05/26/2022   HCT 43.4 05/26/2022   MCV 86 05/26/2022    PLT 192 05/26/2022   Lab Results  Component Value Date   NA 134 05/26/2022   K 4.7 05/26/2022   CO2 29 05/26/2022   GLUCOSE 78 05/26/2022   BUN 25 05/26/2022   CREATININE 1.06 (H) 05/26/2022   BILITOT 0.3 05/26/2022   ALKPHOS 94 05/26/2022   AST 21 05/26/2022   ALT 12 05/26/2022   PROT 7.8 05/26/2022   ALBUMIN 4.7 05/26/2022   CALCIUM  10.1 05/26/2022   ANIONGAP 15 04/05/2022   EGFR 59 (L) 05/26/2022   Lab Results  Component Value Date   CHOL 147 05/26/2022   Lab Results  Component Value Date   HDL 41 05/26/2022   Lab Results  Component Value Date   LDLCALC 71 05/26/2022   Lab Results  Component Value Date   TRIG 210 (H) 05/26/2022   Lab Results  Component Value Date   CHOLHDL 3.6 05/26/2022   Lab Results  Component Value Date   HGBA1C 6.0 (H) 05/26/2022      Assessment & Plan:  Essential hypertension Assessment & Plan: Controlled Continue treatment regimen as is  Low sodium diet with increased physical activity BP Readings from Last 3 Encounters:  11/17/23 130/83  11/16/23 126/84  01/17/23 100/62      Type 2 diabetes mellitus with hyperglycemia, without long-term current use of insulin  (HCC) Assessment & Plan: Encouraged to continue treatment regimen as is  Encouraged to continue treatment  regimen as is  Pending labs Lab Results  Component Value Date   HGBA1C 6.0 (H) 05/26/2022        Oxygen  dependent Assessment & Plan: The patient is on 2L of oxygen  nightly and is requesting a new oxygen  tank. A referral will be placed to Pulmonology today for a walk test to assess the need for ongoing oxygen  therapy.    Orders: -     Pulmonary Visit  Chronic obstructive pulmonary disease, unspecified COPD type (HCC) -     Pulmonary Visit  IFG (impaired fasting glucose) -     Hemoglobin A1c  Vitamin D  deficiency -     VITAMIN D  25 Hydroxy (Vit-D Deficiency, Fractures)  TSH (thyroid -stimulating hormone deficiency) -     TSH + free T4  Other  hyperlipidemia -     Lipid panel -     CMP14+EGFR -     CBC with Differential/Platelet  Note: This chart has been completed using Engineer, Civil (consulting) software, and while attempts have been made to ensure accuracy, certain words and phrases may not be transcribed as intended.    Follow-up: Return in about 4 months (around 03/16/2024).   Jajaira Ruis  Z Bacchus, FNP

## 2023-11-17 NOTE — Assessment & Plan Note (Addendum)
 Encouraged to continue treatment regimen as is  Encouraged to continue treatment regimen as is  Pending labs Lab Results  Component Value Date   HGBA1C 6.0 (H) 05/26/2022

## 2023-11-17 NOTE — Patient Instructions (Addendum)
 I appreciate the opportunity to provide care to you today!    Follow up:  4 months  Fasting Labs: please stop by the lab during the week to get your blood drawn (CBC, CMP, TSH, Lipid profile, HgA1c, Vit D)  For a Healthier YOU, I Recommend: Reducing your intake of sugar, sodium, carbohydrates, and saturated fats. Increasing your fiber intake by incorporating more whole grains, fruits, and vegetables into your meals. Setting healthy goals with a focus on lowering your consumption of carbs, sugar, and unhealthy fats. Adding variety to your diet by including a wide range of fruits and vegetables. Cutting back on soda and limiting processed foods as much as possible. Staying active: In addition to taking your weight loss medication, aim for at least 150 minutes of moderate-intensity physical activity each week for optimal results.    Please follow up if your symptoms worsen or fail to improve.   Referrals today-  Pulmonary    Please continue to a heart-healthy diet and increase your physical activities. Try to exercise for at least five days a week.    It was a pleasure to see you and I look forward to continuing to work together on your health and well-being. Please do not hesitate to call the office if you need care or have questions about your care.  In case of emergency, please visit the Emergency Department for urgent care, or contact our clinic at [phone] to schedule an appointment. We're here to help you!   Have a wonderful day and week. With Gratitude, Meade JENEANE Gerlach MSN, FNP-BC, PMHNP-BC

## 2023-11-17 NOTE — Assessment & Plan Note (Signed)
 Controlled Continue treatment regimen as is  Low sodium diet with increased physical activity BP Readings from Last 3 Encounters:  11/17/23 130/83  11/16/23 126/84  01/17/23 100/62

## 2023-11-17 NOTE — Assessment & Plan Note (Addendum)
 The patient is on 2L of oxygen  nightly and is requesting a new oxygen  tank. A referral will be placed to Pulmonology today for a walk test to assess the need for ongoing oxygen  therapy.

## 2023-11-21 ENCOUNTER — Ambulatory Visit: Payer: Self-pay | Admitting: Physician Assistant

## 2023-11-21 LAB — NUSWAB VAGINITIS PLUS (VG+)
Candida albicans, NAA: NEGATIVE
Candida glabrata, NAA: NEGATIVE

## 2023-11-22 LAB — CMP14+EGFR
ALT: 16 IU/L (ref 0–32)
AST: 17 IU/L (ref 0–40)
Albumin: 4.4 g/dL (ref 3.9–4.9)
Alkaline Phosphatase: 91 IU/L (ref 49–135)
BUN/Creatinine Ratio: 19 (ref 12–28)
BUN: 16 mg/dL (ref 8–27)
Bilirubin Total: 0.4 mg/dL (ref 0.0–1.2)
CO2: 26 mmol/L (ref 20–29)
Calcium: 9.6 mg/dL (ref 8.7–10.3)
Chloride: 103 mmol/L (ref 96–106)
Creatinine, Ser: 0.85 mg/dL (ref 0.57–1.00)
Globulin, Total: 2.8 g/dL (ref 1.5–4.5)
Glucose: 93 mg/dL (ref 70–99)
Potassium: 4.2 mmol/L (ref 3.5–5.2)
Sodium: 142 mmol/L (ref 134–144)
Total Protein: 7.2 g/dL (ref 6.0–8.5)
eGFR: 76 mL/min/1.73 (ref >59)

## 2023-11-22 LAB — CBC WITH DIFFERENTIAL/PLATELET
Basophils Absolute: 0 x10E3/uL (ref 0.0–0.2)
Basos: 0 %
EOS (ABSOLUTE): 0.1 x10E3/uL (ref 0.0–0.4)
Eos: 1 %
Hematocrit: 46.5 % (ref 34.0–46.6)
Hemoglobin: 15.5 g/dL (ref 11.1–15.9)
Immature Grans (Abs): 0 x10E3/uL (ref 0.0–0.1)
Immature Granulocytes: 0 %
Lymphocytes Absolute: 2.8 x10E3/uL (ref 0.7–3.1)
Lymphs: 31 %
MCH: 33.9 pg — ABNORMAL HIGH (ref 26.6–33.0)
MCHC: 33.3 g/dL (ref 31.5–35.7)
MCV: 102 fL — ABNORMAL HIGH (ref 79–97)
Monocytes Absolute: 0.6 x10E3/uL (ref 0.1–0.9)
Monocytes: 7 %
Neutrophils Absolute: 5.6 x10E3/uL (ref 1.4–7.0)
Neutrophils: 61 %
Platelets: 148 x10E3/uL — ABNORMAL LOW (ref 150–450)
RBC: 4.57 x10E6/uL (ref 3.77–5.28)
RDW: 13.2 % (ref 11.7–15.4)
WBC: 9.3 x10E3/uL (ref 3.4–10.8)

## 2023-11-22 LAB — LIPID PANEL
Chol/HDL Ratio: 2.7 ratio (ref 0.0–4.4)
Cholesterol, Total: 114 mg/dL (ref 100–199)
HDL: 42 mg/dL (ref >39)
LDL Chol Calc (NIH): 52 mg/dL (ref 0–99)
Triglycerides: 109 mg/dL (ref 0–149)
VLDL Cholesterol Cal: 20 mg/dL (ref 5–40)

## 2023-11-22 LAB — TSH+FREE T4
Free T4: 0.79 ng/dL — ABNORMAL LOW (ref 0.82–1.77)
TSH: 11.7 u[IU]/mL — ABNORMAL HIGH (ref 0.450–4.500)

## 2023-11-22 LAB — HEMOGLOBIN A1C
Est. average glucose Bld gHb Est-mCnc: 108 mg/dL
Hgb A1c MFr Bld: 5.4 % (ref 4.8–5.6)

## 2023-11-22 LAB — VITAMIN D 25 HYDROXY (VIT D DEFICIENCY, FRACTURES): Vit D, 25-Hydroxy: 36.2 ng/mL (ref 30.0–100.0)

## 2023-11-27 ENCOUNTER — Ambulatory Visit: Payer: Self-pay | Admitting: Family Medicine

## 2023-11-27 DIAGNOSIS — E038 Other specified hypothyroidism: Secondary | ICD-10-CM

## 2023-11-27 MED ORDER — LEVOTHYROXINE SODIUM 100 MCG PO TABS: 100.0000 ug | ORAL_TABLET | Freq: Every day | ORAL | 3 refills | Status: DC

## 2023-11-28 ENCOUNTER — Other Ambulatory Visit: Payer: Self-pay

## 2023-11-28 DIAGNOSIS — E038 Other specified hypothyroidism: Secondary | ICD-10-CM

## 2023-11-28 MED ORDER — LEVOTHYROXINE SODIUM 100 MCG PO TABS: 100.0000 ug | ORAL_TABLET | Freq: Every day | ORAL | 3 refills | Status: AC

## 2023-12-14 NOTE — Progress Notes (Addendum)
 Patricia Bass, female    DOB: 24-Mar-1960    MRN: [id number]   Brief patient profile:  63  yowf  active smoker former pt Dr Vonzell dx of copd but did not meet criteria in 2014   referred to pulmonary clinic in [place]  12/15/2023 by Meade Gerlach  for re-eval  ? Has now developed COPD ?   Pt not previously seen by PCCM service.     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    Also denies any obvious fluctuation of symptoms with weather or environmental changes or other aggravating or alleviating factors except as outlined above   No unusual exposure hx or h/o childhood pna/ asthma or knowledge of premature birth.  Current Allergies, Complete Past Medical History, Past Surgical History, Family History, and Social History were reviewed in [employer] record.  ROS  The following are not active complaints unless bolded Hoarseness, sore throat, dysphagia, dental problems, itching, sneezing,  nasal congestion or discharge of excess mucus or purulent secretions, ear ache,   fever, chills, sweats, unintended wt loss or wt gain, classically pleuritic or exertional cp,  orthopnea pnd or arm/hand swelling  or leg swelling, presyncope, palpitations, abdominal pain, anorexia, nausea, vomiting, diarrhea  or change in bowel habits or change in bladder habits, change in stools or change in urine,  dysuria, hematuria,  rash, arthralgias, visual complaints, headache, numbness, weakness or ataxia or problems with walking or coordination,  change in mood or  memory.            Outpatient Medications Prior to Visit  Medication Sig Dispense Refill   albuterol  (VENTOLIN  HFA) 108 (90 Base) MCG/ACT inhaler Inhale 2 puffs into the lungs every 6 (six) hours as needed for wheezing or shortness of breath. 18 g 1   atorvastatin  (LIPITOR) 40 MG tablet Take 1 tablet (40 mg total) by mouth every evening. 30 tablet 0   Buprenorphine HCl-Naloxone HCl 4-1 MG FILM TAKE 1 FILM UNDER THE TONGUE AND ALLOW TO DISSOLVE TWICE A DAY     Cholecalciferol (VITAMIN D -3 PO) Take 1 capsule by mouth daily.     cyanocobalamin  (VITAMIN B12) 1000 MCG tablet TAKE ONE TABLET BY MOUTH DAILY AT 9AM 30 tablet 2   cycloSPORINE (RESTASIS) 0.05 % ophthalmic emulsion Apply to eye.     Ferrous Sulfate  (IRON PO) Take 1 tablet by mouth daily.     furosemide  (LASIX ) 40 MG tablet Take 1 tablet (40 mg total) by mouth 2 (two) times daily. 60 tablet 2   ibuprofen  (ADVIL ) 800 MG tablet Take 1 tablet (800 mg total) by mouth 2 (two) times daily as needed for headache or moderate pain (pain score 4-6). 30 tablet 0   ipratropium-albuterol  (DUONEB) 0.5-2.5 (3) MG/3ML SOLN Take 3 mLs by nebulization every 6 (six)  hours as needed. 360 mL 1   levothyroxine  (SYNTHROID ) 100 MCG tablet Take 1 tablet (100 mcg total) by mouth daily. 90 tablet 3   lisinopril  (ZESTRIL ) 2.5 MG tablet TAKE ONE TABLET (2.5MG  TOTAL) BY MOUTH DAILY AT 9AM 90 tablet 1   metFORMIN  (GLUCOPHAGE ) 1000 MG tablet TAKE ONE TABLET (1000 MG TOTAL) BY MOUTH TWICE DAILY AT 9AM & 9PM (IN THE MORNING AND AT BEDTIME) 60 tablet 2   methocarbamol  (ROBAXIN ) 750 MG tablet TAKE ONE TABLET (750 mg total) BY MOUTH TWICE DAILY AS NEEDED FOR MUSCLE SPASMS 60 tablet 11   Metoprolol  Tartrate 37.5 MG TABS TAKE ONE TABLET (37.5MG ) BY MOUTH TWICE DAILY @ 9AM & 5PM 180 tablet 1   naloxone (NARCAN) nasal  spray 4 mg/0.1 mL SMARTSIG:Both Nares     omeprazole  (PRILOSEC) 40 MG capsule Take 1 capsule (40 mg total) by mouth daily. 30 capsule 0   ondansetron  (ZOFRAN ) 4 MG tablet Take 1 tablet (4 mg total) by mouth every 8 (eight) hours as needed for nausea or vomiting. 20 tablet 0   potassium chloride  SA (KLOR-CON  M) 20 MEQ tablet TAKE ONE TABLET (20 MEQ TOTAL) BY MOUTH DAILY AT 9AM 30 tablet 11   pregabalin  (LYRICA ) 300 MG capsule Take 1 capsule (300 mg total) by mouth 2 (two) times daily. 60 capsule 0   rOPINIRole  (REQUIP ) 3 MG tablet TAKE ONE TABLET (3MG  TOTAL) BY MOUTH TWICE DAILY @ 9AM, 9PM (IN THE MORNING AND AT BEDTIME) 80 tablet 11   valACYclovir  (VALTREX ) 500 MG tablet TAKE ONE TABLET (500 MG TOTAL) BY MOUTH TWICE DAILY AS NEEDED (FOR COLD SORES) 90 tablet 11   No facility-administered medications prior to visit.    Past Medical History:  Diagnosis Date   Anemia    Anxiety    Arthritis    Bulging disc    Carpal tunnel syndrome    Cervicalgia    Chronic airway obstruction (HCC)    Chronic back pain    COPD (chronic obstructive pulmonary disease) (HCC)    Coronary artery disease    Diabetes mellitus (HCC) 01/11/2012   GERD (gastroesophageal reflux disease)    Headache(784.0)    Hypothyroid    Lumbago    Migraine    Myalgia and myositis, unspecified    Neuropathy    Pneumonia    Restless leg syndrome    Shortness of breath    Sleep apnea    uses O2 at home. uses it at home all times and at night at 2 liters.      Objective:     BP (!) 143/89   Pulse (!) 103   Ht 5' 1 (1.549 m)   Wt 144 lb (65.3 kg)   SpO2 (!) 86%   BMI 27.21 kg/m   SpO2: (!) 86 %   somber amb wf nad   HEENT : Oropharynx  clear/ edentulous   Nasal turbinates nl    NECK :  without  apparent JVD/ palpable Nodes/TM    LUNGS: no acc muscle use,  Min barrel  contour chest wall with bilateral  slightly decreased bs s audible wheeze and  without cough on insp or exp maneuvers and min  Hyperresonant   to  percussion bilaterally    CV:  RRR  no s3 or murmur or increase in P2, and no edema   ABD:  soft and nontender    MS:  Nl gait/ ext warm without deformities Or obvious joint restrictions  calf tenderness, cyanosis or  clubbing     SKIN: warm and dry without lesions    NEURO:  alert, approp, nl sensorium with  no motor or cerebellar deficits apparent.              Assessment   Assessment & Plan Asthmatic bronchitis , chronic (HCC) Active smoker - PFTs  02/2012 no obstruction - 12/15/2023  After extensive coaching inhaler device,  effectiveness =    75% (short Ti) hfa > breztri trial  - Alpha one AT phenotype ordered 12/15/2023   Pt is Group B in terms of symptom/risk and laba/lama therefore appropriate rx at this point >>>  Bevespi / stiolto or Anoro best choices but samples available only for breztr so try this x 6 weeks then return for f/u pfts   Re SABA :  I spent extra time with pt today reviewing appropriate use of albuterol  for prn use on exertion with the following points: 1) saba is for relief of sob that does not improve by walking a slower pace or resting but rather if the pt does not improve after trying this first. 2) If the pt is convinced, as many are, that saba helps recover from activity faster then it's easy to tell if this is the case by re-challenging : ie stop, take the inhaler, then p 5 minutes try the exact same activity (intensity of workload) that just caused the symptoms and see if they are substantially diminished or not after saba 3) if there is an activity that reproducibly causes the symptoms, try the saba 15 min before the activity on alternate days   If in fact the saba really does help, then fine to continue to use it prn but advised may need to look closer at the maintenance regimen being used to achieve better control of airways disease with exertion.       Cigarette smoker Counseled re importance of smoking cessation but did not meet time  criteria for separate billing    Low-dose CT lung cancer screening is recommended for patients who are 71-40 years of age with a 20+ pack-year history of smoking and who are currently smoking or quit <=15 years ago. No coughing up blood  No unintentional weight loss of > 15 pounds in the last 6 months - pt is eligible for scanning yearly until age 36 even if quits now > referred to [facility] program .     Essential hypertension D/c lisinopril  (dry cough ) 12/15/2023   In the best review of chronic cough to date ( NEJM 2016 375 8455-8448) ,  ACEi are now felt to cause cough in up to  20% of pts which is a 4 fold increase from previous reports and does not include the variety of non-specific complaints we see in pulmonary clinic in pts on ACEi but previously attributed to another dx like  Copd/asthma and  include PNDS, throat and chest congestion, bronchitis, unexplained dyspnea and noct strangling sensations, and hoarseness, but also  atypical /refractory GERD symptoms like dysphagia and bad heartburn   The only way I know  to prove this is not an ACEi Case is a trial off ACEi x a minimum of 6 weeks then regroup.   >>> try valsartan 40 mg one daily    Discussed in detail all the  indications, usual  risks and alternatives  relative to the benefits with patient who agrees to proceed with Rx as outlined.       Chronic respiratory failure with hypoxia (HCC)  12/15/2023  RA  = 86% 12/15/2023   Walked on 2lpm 22  x  2  lap(s) =  approx 300  ft  @ slow  pace, stopped due to back pain   with lowest 02 sats 90%    >>>> rec 2lpm 24/7, ok to titrate up with exertion goal = at least 90%   Each maintenance medication was reviewed in detail including emphasizing most importantly the difference between maintenance and prns and under what circumstances the prns are to be triggered using an action plan format where appropriate.  Total time for H and P, chart review, counseling, reviewing  hfa/neb/02/pulse ox  device(s) , directly observing portions of ambulatory 02 saturation study/ and generating customized AVS unique to this office visit / same day charting = 48 min new pt eval                  AVS  Patient Instructions  Plan A = Automatic = Always=   Breztri Take 2 puffs first thing in am and then another 2 puffs about 12 hours later.    Work on inhaler technique:  relax and gently blow all the way out then take a nice smooth full deep breath back in, triggering the inhaler at same time you start breathing in.  Hold breath in for at least  5 seconds if you can. Blow out breztri  thru nose. Rinse and gargle with water when done.  If mouth or throat bother you at all,  try brushing teeth/gums/tongue with arm and hammer toothpaste/ make a slurry and gargle and spit out.       Plan B = Backup (to supplement plan A, not to replace it) Use your albuterol  inhaler as a rescue medication to be used if you can't catch your breath by resting or slowing your pace  or doing a relaxed purse lip breathing pattern.  - The less you use it, the better it will work when you need it. - Ok to use the inhaler up to 2 puffs  every 4 hours if you must but call for appointment if use goes up over your usual need - Don't leave home without it !!  (think of it like the spare tire or starter fluid for your car)   Also  Ok to try albuterol  15 min before an activity (on alternating days)  that you know would usually make you short of breath and see if it makes any difference and if makes none then don't take albuterol  after activity unless you can't catch your breath as this means it's the resting that helps, not the albuterol .  Stop lisinopril  and start valsartan 40 mg daily in its place and your improve especially if you stop all smoking       My office will be contacting you by phone for referral to lung cancer screening   [phone]) - if you don't hear back from my office within one week,   please call us  back or notify us  thru MyChart and we'll address it right away.    Please schedule a follow up visit in 3 months but call sooner if needed with PFTs        Ozell America, MD 12/15/2023

## 2023-12-15 ENCOUNTER — Encounter: Payer: Self-pay | Admitting: Internal Medicine

## 2023-12-15 ENCOUNTER — Ambulatory Visit (INDEPENDENT_AMBULATORY_CARE_PROVIDER_SITE_OTHER): Admitting: Internal Medicine

## 2023-12-15 VITALS — BP 143/89 | HR 103 | Ht 61.0 in | Wt 144.0 lb

## 2023-12-15 DIAGNOSIS — F17210 Nicotine dependence, cigarettes, uncomplicated: Secondary | ICD-10-CM | POA: Insufficient documentation

## 2023-12-15 DIAGNOSIS — J4489 Other specified chronic obstructive pulmonary disease: Secondary | ICD-10-CM | POA: Insufficient documentation

## 2023-12-15 DIAGNOSIS — R29898 Other symptoms and signs involving the musculoskeletal system: Secondary | ICD-10-CM

## 2023-12-15 DIAGNOSIS — I10 Essential (primary) hypertension: Secondary | ICD-10-CM | POA: Diagnosis not present

## 2023-12-15 DIAGNOSIS — J9611 Chronic respiratory failure with hypoxia: Secondary | ICD-10-CM | POA: Diagnosis not present

## 2023-12-15 MED ORDER — BREZTRI AEROSPHERE 160-9-4.8 MCG/ACT IN AERO: 2.0000 | INHALATION_SPRAY | Freq: Two times a day (BID) | RESPIRATORY_TRACT | 11 refills | Status: AC

## 2023-12-15 MED ORDER — BREZTRI AEROSPHERE 160-9-4.8 MCG/ACT IN AERO: 2.0000 | INHALATION_SPRAY | Freq: Two times a day (BID) | RESPIRATORY_TRACT | Status: AC

## 2023-12-15 MED ORDER — VALSARTAN 40 MG PO TABS: 40.0000 mg | ORAL_TABLET | Freq: Every day | ORAL | 11 refills | Status: AC

## 2023-12-15 NOTE — Assessment & Plan Note (Addendum)
 Counseled re importance of smoking cessation but did not meet time criteria for separate billing    Low-dose CT lung cancer screening is recommended for patients who are 51-64 years of age with a 20+ pack-year history of smoking and who are currently smoking or quit <=15 years ago. No coughing up blood  No unintentional weight loss of > 15 pounds in the last 6 months - pt is eligible for scanning yearly until age 41 even if quits now > referred to [facility] program .

## 2023-12-15 NOTE — Assessment & Plan Note (Addendum)
 Active smoker - PFTs  02/2012 no obstruction - 12/15/2023  After extensive coaching inhaler device,  effectiveness =    75% (short Ti) hfa > breztri trial  - Alpha one AT phenotype ordered 12/15/2023   Pt is Group B in terms of symptom/risk and laba/lama therefore appropriate rx at this point >>>  Bevespi / stiolto or Anoro best choices but samples available only for breztr so try this x 6 weeks then return for f/u pfts   Re SABA :  I spent extra time with pt today reviewing appropriate use of albuterol  for prn use on exertion with the following points: 1) saba is for relief of sob that does not improve by walking a slower pace or resting but rather if the pt does not improve after trying this first. 2) If the pt is convinced, as many are, that saba helps recover from activity faster then it's easy to tell if this is the case by re-challenging : ie stop, take the inhaler, then p 5 minutes try the exact same activity (intensity of workload) that just caused the symptoms and see if they are substantially diminished or not after saba 3) if there is an activity that reproducibly causes the symptoms, try the saba 15 min before the activity on alternate days   If in fact the saba really does help, then fine to continue to use it prn but advised may need to look closer at the maintenance regimen being used to achieve better control of airways disease with exertion.

## 2023-12-15 NOTE — Assessment & Plan Note (Addendum)
 12/15/2023  RA  = 86% 12/15/2023   Walked on 2lpm 22  x  2  lap(s) =  approx 300  ft  @ slow  pace, stopped due to back pain   with lowest 02 sats 90%    >>>> rec 2lpm 24/7, ok to titrate up with exertion goal = at least 90%   Each maintenance medication was reviewed in detail including emphasizing most importantly the difference between maintenance and prns and under what circumstances the prns are to be triggered using an action plan format where appropriate.  Total time for H and P, chart review, counseling, reviewing hfa/neb/02/pulse ox  device(s) , directly observing portions of ambulatory 02 saturation study/ and generating customized AVS unique to this office visit / same day charting = 48 min new pt eval

## 2023-12-15 NOTE — Assessment & Plan Note (Addendum)
 D/c lisinopril  (dry cough ) 12/15/2023   In the best review of chronic cough to date ( NEJM 2016 375 8455-8448) ,  ACEi are now felt to cause cough in up to  20% of pts which is a 4 fold increase from previous reports and does not include the variety of non-specific complaints we see in pulmonary clinic in pts on ACEi but previously attributed to another dx like  Copd/asthma and  include PNDS, throat and chest congestion, bronchitis, unexplained dyspnea and noct strangling sensations, and hoarseness, but also  atypical /refractory GERD symptoms like dysphagia and bad heartburn   The only way I know  to prove this is not an ACEi Case is a trial off ACEi x a minimum of 6 weeks then regroup.   >>> try valsartan 40 mg one daily    Discussed in detail all the  indications, usual  risks and alternatives  relative to the benefits with patient who agrees to proceed with Rx as outlined.

## 2023-12-15 NOTE — Patient Instructions (Addendum)
 Plan A = Automatic = Always=   Breztri Take 2 puffs first thing in am and then another 2 puffs about 12 hours later.    Work on inhaler technique:  relax and gently blow all the way out then take a nice smooth full deep breath back in, triggering the inhaler at same time you start breathing in.  Hold breath in for at least  5 seconds if you can. Blow out breztri  thru nose. Rinse and gargle with water when done.  If mouth or throat bother you at all,  try brushing teeth/gums/tongue with arm and hammer toothpaste/ make a slurry and gargle and spit out.       Plan B = Backup (to supplement plan A, not to replace it) Use your albuterol  inhaler as a rescue medication to be used if you can't catch your breath by resting or slowing your pace  or doing a relaxed purse lip breathing pattern.  - The less you use it, the better it will work when you need it. - Ok to use the inhaler up to 2 puffs  every 4 hours if you must but call for appointment if use goes up over your usual need - Don't leave home without it !!  (think of it like the spare tire or starter fluid for your car)   Also  Ok to try albuterol  15 min before an activity (on alternating days)  that you know would usually make you short of breath and see if it makes any difference and if makes none then don't take albuterol  after activity unless you can't catch your breath as this means it's the resting that helps, not the albuterol .  Stop lisinopril  and start valsartan 40 mg daily in its place and your improve especially if you stop all smoking       My office will be contacting you by phone for referral to lung cancer screening   [phone] - if you don't hear back from my office within one week,  please call us  back or notify us  thru MyChart and we'll address it right away.    Please schedule a follow up visit in 3 months but call sooner if needed with PFTs

## 2023-12-21 LAB — CBC WITH DIFFERENTIAL/PLATELET
Basophils Absolute: 0 x10E3/uL (ref 0.0–0.2)
Basos: 0 %
EOS (ABSOLUTE): 0.1 x10E3/uL (ref 0.0–0.4)
Eos: 1 %
Hematocrit: 45.6 % (ref 34.0–46.6)
Hemoglobin: 15.5 g/dL (ref 11.1–15.9)
Immature Grans (Abs): 0 x10E3/uL (ref 0.0–0.1)
Immature Granulocytes: 0 %
Lymphocytes Absolute: 1.9 x10E3/uL (ref 0.7–3.1)
Lymphs: 21 %
MCH: 34.5 pg — ABNORMAL HIGH (ref 26.6–33.0)
MCHC: 34 g/dL (ref 31.5–35.7)
MCV: 102 fL — ABNORMAL HIGH (ref 79–97)
Monocytes Absolute: 0.6 x10E3/uL (ref 0.1–0.9)
Monocytes: 7 %
Neutrophils Absolute: 6.5 x10E3/uL (ref 1.4–7.0)
Neutrophils: 71 %
Platelets: 147 x10E3/uL — ABNORMAL LOW (ref 150–450)
RBC: 4.49 x10E6/uL (ref 3.77–5.28)
RDW: 12.8 % (ref 11.7–15.4)
WBC: 9.2 x10E3/uL (ref 3.4–10.8)

## 2023-12-21 LAB — BASIC METABOLIC PANEL WITH GFR
BUN/Creatinine Ratio: 16 (ref 12–28)
BUN: 13 mg/dL (ref 8–27)
CO2: 27 mmol/L (ref 20–29)
Calcium: 9.5 mg/dL (ref 8.7–10.3)
Chloride: 100 mmol/L (ref 96–106)
Creatinine, Ser: 0.81 mg/dL (ref 0.57–1.00)
Glucose: 80 mg/dL (ref 70–99)
Potassium: 4.2 mmol/L (ref 3.5–5.2)
Sodium: 140 mmol/L (ref 134–144)
eGFR: 81 mL/min/1.73 (ref >59)

## 2023-12-21 LAB — ALPHA-1-ANTITRYPSIN PHENOTYP: A-1 Antitrypsin: 144 mg/dL (ref 101–187)

## 2023-12-23 ENCOUNTER — Ambulatory Visit: Payer: Self-pay | Admitting: Internal Medicine

## 2023-12-23 NOTE — Progress Notes (Signed)
 Atc x1 lmtcb

## 2024-01-06 ENCOUNTER — Other Ambulatory Visit: Payer: Self-pay | Admitting: Family Medicine

## 2024-01-06 DIAGNOSIS — E876 Hypokalemia: Secondary | ICD-10-CM

## 2024-01-06 DIAGNOSIS — G2581 Restless legs syndrome: Secondary | ICD-10-CM

## 2024-01-06 NOTE — Telephone Encounter (Signed)
 Copied from CRM #[id number]. Topic: Clinical - Medication Refill >> Jan 06, 2024 10:19 AM Sophia H wrote: Medication:  metFORMIN  (GLUCOPHAGE ) 1000 MG tablet potassium chloride  SA (KLOR-CON  M) 20 MEQ tablet furosemide  (LASIX ) 40 MG tablet  atorvastatin  (LIPITOR) 40 MG tablet  rOPINIRole  (REQUIP ) 3 MG tablet  ibuprofen  (ADVIL ) 800 MG tablet   Has the patient contacted their pharmacy? Yes, updated RX sent to pharmacy.  This is the patient's preferred pharmacy:   [facility], [place] - [id number] [place] [address] Phone: [phone] Fax: [phone]  Is this the correct pharmacy for this prescription? Yes If no, delete pharmacy and type the correct one.   Has the prescription been filled recently? Yes  Is the patient out of the medication? Yes, completely out.   Has the patient been seen for an appointment in the last year OR does the patient have an upcoming appointment? Yes, had an appt back in November.   Can we respond through MyChart? Yes  Agent: Please be advised that Rx refills may take up to 3 business days. We ask that you follow-up with your pharmacy.

## 2024-01-09 MED ORDER — ATORVASTATIN CALCIUM 40 MG PO TABS: 40.0000 mg | ORAL_TABLET | Freq: Every evening | ORAL | 0 refills | Status: DC

## 2024-01-09 MED ORDER — IBUPROFEN 800 MG PO TABS: 800.0000 mg | ORAL_TABLET | Freq: Two times a day (BID) | ORAL | 0 refills | Status: AC | PRN

## 2024-01-09 MED ORDER — ROPINIROLE HCL 3 MG PO TABS: ORAL_TABLET | ORAL | 11 refills | Status: AC

## 2024-01-09 MED ORDER — FUROSEMIDE 40 MG PO TABS: 40.0000 mg | ORAL_TABLET | Freq: Two times a day (BID) | ORAL | 2 refills | Status: AC

## 2024-01-09 MED ORDER — METFORMIN HCL 1000 MG PO TABS: ORAL_TABLET | ORAL | 2 refills | Status: AC

## 2024-01-09 MED ORDER — POTASSIUM CHLORIDE CRYS ER 20 MEQ PO TBCR: EXTENDED_RELEASE_TABLET | ORAL | 11 refills | Status: AC

## 2024-01-11 ENCOUNTER — Other Ambulatory Visit: Payer: Self-pay | Admitting: Family Medicine

## 2024-01-11 ENCOUNTER — Ambulatory Visit: Admitting: Internal Medicine

## 2024-01-11 NOTE — Progress Notes (Signed)
 Erroneous encounter - please disregard.

## 2024-01-12 ENCOUNTER — Encounter: Payer: Self-pay | Admitting: Internal Medicine

## 2024-01-12 ENCOUNTER — Ambulatory Visit: Attending: Internal Medicine | Admitting: Internal Medicine

## 2024-01-12 VITALS — BP 108/58 | HR 80 | Ht 61.0 in | Wt 142.0 lb

## 2024-01-12 DIAGNOSIS — I5032 Chronic diastolic (congestive) heart failure: Secondary | ICD-10-CM

## 2024-01-12 DIAGNOSIS — Z79899 Other long term (current) drug therapy: Secondary | ICD-10-CM | POA: Diagnosis not present

## 2024-01-12 MED ORDER — DAPAGLIFLOZIN PROPANEDIOL 10 MG PO TABS: 10.0000 mg | ORAL_TABLET | Freq: Every day | ORAL | 5 refills | Status: AC

## 2024-01-12 NOTE — Patient Instructions (Signed)
 Medication Instructions:  Your physician has recommended you make the following change in your medication:  Start Farxiga  10 mg once daily Continue taking all other medications as prescribed   Labwork: BMET to be completed in one week at [facility] in [place]   Testing/Procedures: None  Follow-Up: Your physician recommends that you schedule a follow-up appointment in: 6 months  Any Other Special Instructions Will Be Listed Below (If Applicable). Thank you for choosing [facility]!     If you need a refill on your cardiac medications before your next appointment, please call your pharmacy.

## 2024-01-12 NOTE — Progress Notes (Signed)
 "    Cardiology Office Note  Date: 01/12/2024   ID: Patricia Bass, DOB Oct 16, 1960, MRN [id number]  PCP:  Patricia Meade PEDLAR, FNP  Cardiologist:  Patricia SHAUNNA Maywood, MD Electrophysiologist:  None   History of Present Illness: Patricia Bass is a 64 y.o. female known to have HTN, DM 2, OSA on home oxygen , chronic diastolic heart failure with severe RV dysfunction, COPD on home O2 at night is here for follow-up visit  Patient was admitted to [facility] in 03/2022 with acute on chronic diastolic heart failure exacerbation and severe RV dysfunction.  CT angio PE was negative for PE.  She was adequately diuresed and discharged to follow-up with cardiology clinic.  She underwent Lexiscan  which showed no evidence of ischemia.  Limited echocardiogram from January 2025 showed that RV function has normalized.  She is here today for follow-up visit.  Currently on home O2 24 x 7.  Previously had home O2 only at night.  Still smokes cigarettes.  30 cigarettes every day.  Does not have any angina or DOE.  She has chronic stable DOE.  No recent worsening.  No orthopnea, PND, leg swelling.  No dizziness, syncope, palpitations.  Past Medical History:  Diagnosis Date   Anemia    Anxiety    Arthritis    Bulging disc    Carpal tunnel syndrome    Cervicalgia    Chronic airway obstruction (HCC)    Chronic back pain    COPD (chronic obstructive pulmonary disease) (HCC)    Coronary artery disease    Diabetes mellitus (HCC) 01/11/2012   GERD (gastroesophageal reflux disease)    Headache(784.0)    Hypothyroid    Lumbago    Migraine    Myalgia and myositis, unspecified    Neuropathy    Pneumonia    Restless leg syndrome    Shortness of breath    Sleep apnea    uses O2 at home. uses it at home all times and at night at 2 liters.    Past Surgical History:  Procedure Laterality Date   BACK SURGERY     BIOPSY  12/06/2016   Procedure: BIOPSY;  Surgeon: Shaaron Lamar HERO, MD;  Location: [facility];   Service: Endoscopy;;  gastric    BLADDER SUSPENSION     CARDIAC CATHETERIZATION     CHOLECYSTECTOMY     COLONOSCOPY WITH PROPOFOL  N/A 12/06/2016   Procedure: COLONOSCOPY WITH PROPOFOL   Formed stool could not do;  Surgeon: Shaaron Lamar HERO, MD;  Location: [facility];  Service: Endoscopy;  Laterality: N/A;  10:00am   ESOPHAGOGASTRODUODENOSCOPY (EGD) WITH PROPOFOL  N/A 12/06/2016   Procedure: ESOPHAGOGASTRODUODENOSCOPY (EGD) WITH PROPOFOL ;  Surgeon: Shaaron Lamar HERO, MD;  Location: [facility];  Service: Endoscopy;  Laterality: N/A;   MALONEY DILATION N/A 12/06/2016   Procedure: AGAPITO DILATION;  Surgeon: Shaaron Lamar HERO, MD;  Location: [facility];  Service: Endoscopy;  Laterality: N/A;   TUBAL LIGATION     ULNAR NERVE TRANSPOSITION  04/01/2011   Procedure: ULNAR NERVE DECOMPRESSION/TRANSPOSITION;  Surgeon: Elsie Mussel, MD;  Location: [facility];  Service: Orthopedics;  Laterality: Left;  ULNAR NERVE RELEASE WITH ANTERIOR TRANS POSITION REPAIR RECONSTRUCTION AS NECESSARY    Current Outpatient Medications  Medication Sig Dispense Refill   albuterol  (VENTOLIN  HFA) 108 (90 Base) MCG/ACT inhaler Inhale 2 puffs into the lungs every 6 (six) hours as needed for wheezing or shortness of breath. 18 g 1   atorvastatin  (LIPITOR) 40 MG tablet Take 1  tablet (40 mg total) by mouth every evening. 30 tablet 0   budesonide-glycopyrrolate -formoterol (BREZTRI  AEROSPHERE) 160-9-4.8 MCG/ACT AERO inhaler Inhale 2 puffs into the lungs 2 (two) times daily. Take 2 puffs first thing in am and then another 2 puffs about 12 hours later. 10.7 g 11   buprenorphine (SUBUTEX) 8 MG SUBL SL tablet Place 8 mg under the tongue daily.     Cholecalciferol (VITAMIN D -3 PO) Take 1 capsule by mouth daily.     cyanocobalamin  (VITAMIN B12) 1000 MCG tablet TAKE ONE TABLET BY MOUTH DAILY AT 9AM 30 tablet 2   cycloSPORINE (RESTASIS) 0.05 % ophthalmic emulsion Apply to eye.     Ferrous Sulfate  (IRON PO) Take 1 tablet by mouth daily.      furosemide  (LASIX ) 40 MG tablet Take 1 tablet (40 mg total) by mouth 2 (two) times daily. 60 tablet 2   ibuprofen  (ADVIL ) 800 MG tablet Take 1 tablet (800 mg total) by mouth 2 (two) times daily as needed for headache or moderate pain (pain score 4-6). 30 tablet 0   levothyroxine  (SYNTHROID ) 100 MCG tablet Take 1 tablet (100 mcg total) by mouth daily. 90 tablet 3   metFORMIN  (GLUCOPHAGE ) 1000 MG tablet TAKE ONE TABLET (1000 MG TOTAL) BY MOUTH TWICE DAILY AT 9AM & 9PM (IN THE MORNING AND AT BEDTIME) 60 tablet 2   methocarbamol  (ROBAXIN ) 750 MG tablet TAKE ONE TABLET (750 mg total) BY MOUTH TWICE DAILY AS NEEDED FOR MUSCLE SPASMS 60 tablet 11   Metoprolol  Tartrate 37.5 MG TABS TAKE ONE TABLET (37.5MG ) BY MOUTH TWICE DAILY @ 9AM & 5PM 180 tablet 1   naloxone (NARCAN) nasal spray 4 mg/0.1 mL SMARTSIG:Both Nares     omeprazole  (PRILOSEC) 40 MG capsule Take 1 capsule (40 mg total) by mouth daily. 30 capsule 0   potassium chloride  SA (KLOR-CON  M) 20 MEQ tablet TAKE ONE TABLET (20 MEQ TOTAL) BY MOUTH DAILY AT 9AM 30 tablet 11   pregabalin  (LYRICA ) 300 MG capsule Take 1 capsule (300 mg total) by mouth 2 (two) times daily. 60 capsule 0   rOPINIRole  (REQUIP ) 3 MG tablet TAKE ONE TABLET (3MG  TOTAL) BY MOUTH TWICE DAILY @ 9AM, 9PM (IN THE MORNING AND AT BEDTIME) 80 tablet 11   valACYclovir  (VALTREX ) 500 MG tablet TAKE ONE TABLET (500 MG TOTAL) BY MOUTH TWICE DAILY AS NEEDED (FOR COLD SORES) 90 tablet 11   valsartan  (DIOVAN ) 40 MG tablet Take 1 tablet (40 mg total) by mouth daily. 30 tablet 11   No current facility-administered medications for this visit.   Allergies:  Duragesic  disc transdermal system [fentanyl ]   Social History: The patient  reports that she has been smoking cigarettes and e-cigarettes. She has a 20 pack-year smoking history. She has never used smokeless tobacco. She reports current drug use. Drugs: Marijuana and Oxycodone . She reports that she does not drink alcohol.   Family History: The  patient's family history includes Asthma in her father; Heart attack in her father; Hypertension in her mother.   ROS:  Please see the history of present illness. Otherwise, complete review of systems is positive for none  All other systems are reviewed and negative.   Physical Exam: VS:  BP (!) 108/58 (BP Location: Right Arm, Patient Position: Sitting, Cuff Size: Normal)   Pulse 80   Ht 5' 1 (1.549 m)   Wt 142 lb (64.4 kg)   SpO2 96% Comment: on 3 L O2 via [place]  BMI 26.83 kg/m , BMI Body mass index  is 26.83 kg/m.  Wt Readings from Last 3 Encounters:  01/12/24 142 lb (64.4 kg)  12/15/23 144 lb (65.3 kg)  11/17/23 141 lb (64 kg)    General: Patient appears comfortable at rest. HEENT: Conjunctiva and lids normal, oropharynx clear with moist mucosa. Neck: Supple, no elevated JVP or carotid bruits, no thyromegaly. Lungs: Clear to auscultation, nonlabored breathing at rest. Cardiac: Regular rate and rhythm, no S3 or significant systolic murmur, no pericardial rub. Abdomen: Soft, nontender, no hepatomegaly, bowel sounds present, no guarding or rebound. Extremities: No pitting edema, distal pulses 2+. Skin: Warm and dry. Musculoskeletal: No kyphosis. Neuropsychiatric: Alert and oriented x3, affect grossly appropriate.  Recent Labwork: 11/21/2023: ALT 16; AST 17; TSH 11.700 12/15/2023: BUN 13; Creatinine, Ser 0.81; Hemoglobin 15.5; Platelets 147; Potassium 4.2; Sodium 140     Component Value Date/Time   CHOL 114 11/21/2023 0811   TRIG 109 11/21/2023 0811   HDL 42 11/21/2023 0811   CHOLHDL 2.7 11/21/2023 0811   CHOLHDL 6.8 04/17/2007 0815   VLDL UNABLE TO CALCULATE IF TRIGLYCERIDE OVER 400 mg/dL 95/86/7990 9184   LDLCALC 52 11/21/2023 0811    Assessment and Plan:  Chronic diastolic heart failure: Compensated.  Previous [facility] hospitalization in March 2024 with ADHF and had an incidental finding of severe RV dysfunction that normalized on repeat limited echocardiogram.  Continue p.o.  Lasix  40 mg twice daily.  Start Farxiga  10 mg once daily and obtain BMP in 5 days.  Severe RV dysfunction in March 2024: Normalized on the limited echocardiogram from January 2025.  Mild to moderate pericardial effusion in March 2024: No recent worsening of DOE.  Continue to monitor.  Nicotine  abuse: Smokes 30 cigarettes daily.  Counseling provided.  Previously on Chantix  that helped her but she ran out of prescription.  She will reach out to her PCP.  Sinus tachycardia: Resolved.  HTN, controlled: Continue current antihypertensives.  Continue diuretics, as above.  Continue metoprolol  and valsartan .  I spent 30 minutes in reviewing prior medical records, reports, more than the labs, discussion and documentation.  Medication Adjustments/Labs and Tests Ordered: Current medicines are reviewed at length with the patient today.  Concerns regarding medicines are outlined above.   Tests Ordered: Orders Placed This Encounter  Procedures   EKG 12-Lead    Medication Changes: No orders of the defined types were placed in this encounter.   Disposition:  Follow up 6 months  Signed Mariajose Mow Priya Yitzel Shasteen, MD, 01/12/2024 11:46 AM    [facility] at [facility] [address] [place], [place], [place] [address]  "

## 2024-01-17 NOTE — Progress Notes (Signed)
 Atc x2 lmtcb sending letter

## 2024-01-20 LAB — BASIC METABOLIC PANEL WITH GFR
BUN/Creatinine Ratio: 10 — ABNORMAL LOW (ref 12–28)
BUN: 9 mg/dL (ref 8–27)
CO2: 24 mmol/L (ref 20–29)
Calcium: 10 mg/dL (ref 8.7–10.3)
Chloride: 102 mmol/L (ref 96–106)
Creatinine, Ser: 0.92 mg/dL (ref 0.57–1.00)
Glucose: 81 mg/dL (ref 70–99)
Potassium: 5 mmol/L (ref 3.5–5.2)
Sodium: 144 mmol/L (ref 134–144)
eGFR: 70 mL/min/1.73

## 2024-01-23 ENCOUNTER — Ambulatory Visit: Payer: Self-pay | Admitting: Internal Medicine

## 2024-01-24 NOTE — Telephone Encounter (Signed)
-----   Message from Vishnu Mallipeddi, MD sent at 01/23/2024  4:15 PM EST ----- Cr normal. Continue Farxiga .

## 2024-01-24 NOTE — Telephone Encounter (Signed)
 The patient has been notified of the result and verbalized understanding.  All questions (if any) were answered. Littie CHRISTELLA Croak, CMA 01/24/2024 1:33 PM

## 2024-02-01 ENCOUNTER — Other Ambulatory Visit: Payer: Self-pay | Admitting: Family Medicine

## 2024-02-28 ENCOUNTER — Encounter (HOSPITAL_COMMUNITY)

## 2024-03-05 ENCOUNTER — Ambulatory Visit: Admitting: Internal Medicine

## 2024-04-02 ENCOUNTER — Ambulatory Visit
